# Patient Record
Sex: Female | Born: 1980 | Race: White | Hispanic: No | Marital: Married | Smoking: Former smoker
Health system: Southern US, Community
[De-identification: ages and names within clinical notes are randomized; demographics above are authoritative.]

## PROBLEM LIST (undated history)

## (undated) DIAGNOSIS — O34219 Maternal care for unspecified type scar from previous cesarean delivery: Secondary | ICD-10-CM

## (undated) DIAGNOSIS — F419 Anxiety disorder, unspecified: Secondary | ICD-10-CM

## (undated) DIAGNOSIS — N390 Urinary tract infection, site not specified: Secondary | ICD-10-CM

## (undated) DIAGNOSIS — J189 Pneumonia, unspecified organism: Secondary | ICD-10-CM

## (undated) DIAGNOSIS — G43909 Migraine, unspecified, not intractable, without status migrainosus: Secondary | ICD-10-CM

## (undated) DIAGNOSIS — D649 Anemia, unspecified: Secondary | ICD-10-CM

## (undated) DIAGNOSIS — N200 Calculus of kidney: Secondary | ICD-10-CM

## (undated) DIAGNOSIS — I639 Cerebral infarction, unspecified: Secondary | ICD-10-CM

## (undated) DIAGNOSIS — R87629 Unspecified abnormal cytological findings in specimens from vagina: Secondary | ICD-10-CM

## (undated) DIAGNOSIS — N189 Chronic kidney disease, unspecified: Secondary | ICD-10-CM

## (undated) DIAGNOSIS — E785 Hyperlipidemia, unspecified: Secondary | ICD-10-CM

## (undated) DIAGNOSIS — Z87442 Personal history of urinary calculi: Secondary | ICD-10-CM

## (undated) HISTORY — PX: KIDNEY STONE SURGERY: SHX686

## (undated) HISTORY — DX: Unspecified abnormal cytological findings in specimens from vagina: R87.629

## (undated) HISTORY — DX: Cerebral infarction, unspecified: I63.9

## (undated) HISTORY — DX: Anxiety disorder, unspecified: F41.9

## (undated) HISTORY — DX: Calculus of kidney: N20.0

## (undated) HISTORY — DX: Migraine, unspecified, not intractable, without status migrainosus: G43.909

## (undated) HISTORY — DX: Chronic kidney disease, unspecified: N18.9

## (undated) HISTORY — DX: Urinary tract infection, site not specified: N39.0

## (undated) HISTORY — DX: Hyperlipidemia, unspecified: E78.5

---
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
## 2012-10-06 ENCOUNTER — Encounter: Payer: Self-pay | Admitting: Internal Medicine

## 2012-10-06 ENCOUNTER — Ambulatory Visit (INDEPENDENT_AMBULATORY_CARE_PROVIDER_SITE_OTHER): Payer: 59 | Admitting: Internal Medicine

## 2012-10-06 ENCOUNTER — Telehealth: Payer: Self-pay | Admitting: *Deleted

## 2012-10-06 VITALS — BP 120/78 | HR 80 | Temp 98.2°F | Resp 16 | Ht 65.0 in | Wt 134.0 lb

## 2012-10-06 DIAGNOSIS — Z23 Encounter for immunization: Secondary | ICD-10-CM

## 2012-10-06 DIAGNOSIS — Z1331 Encounter for screening for depression: Secondary | ICD-10-CM

## 2012-10-06 DIAGNOSIS — Z0000 Encounter for general adult medical examination without abnormal findings: Secondary | ICD-10-CM

## 2012-10-06 DIAGNOSIS — Z720 Tobacco use: Secondary | ICD-10-CM | POA: Insufficient documentation

## 2012-10-06 DIAGNOSIS — G43909 Migraine, unspecified, not intractable, without status migrainosus: Secondary | ICD-10-CM | POA: Insufficient documentation

## 2012-10-06 DIAGNOSIS — F17200 Nicotine dependence, unspecified, uncomplicated: Secondary | ICD-10-CM

## 2012-10-06 DIAGNOSIS — Z7189 Other specified counseling: Secondary | ICD-10-CM | POA: Insufficient documentation

## 2012-10-06 LAB — CBC WITH DIFFERENTIAL/PLATELET
Eosinophils Relative: 3 % (ref 0.0–5.0)
HCT: 44.8 % (ref 36.0–46.0)
Hemoglobin: 15.3 g/dL — ABNORMAL HIGH (ref 12.0–15.0)
Lymphs Abs: 1.1 10*3/uL (ref 0.7–4.0)
MCV: 95.4 fl (ref 78.0–100.0)
Monocytes Absolute: 0.5 10*3/uL (ref 0.1–1.0)
Monocytes Relative: 10 % (ref 3.0–12.0)
Neutro Abs: 3.1 10*3/uL (ref 1.4–7.7)
Platelets: 229 10*3/uL (ref 150.0–400.0)
RDW: 13 % (ref 11.5–14.6)
WBC: 4.9 10*3/uL (ref 4.5–10.5)

## 2012-10-06 LAB — LIPID PANEL
Cholesterol: 157 mg/dL (ref 0–200)
HDL: 69.5 mg/dL (ref >39.00)
LDL Cholesterol: 75 mg/dL (ref 0–99)
Triglycerides: 64 mg/dL (ref 0.0–149.0)
VLDL: 12.8 mg/dL (ref 0.0–40.0)

## 2012-10-06 LAB — POCT URINALYSIS DIPSTICK
Bilirubin, UA: NEGATIVE
Glucose, UA: NEGATIVE
Ketones, UA: NEGATIVE
Nitrite, UA: NEGATIVE
Spec Grav, UA: 1.015
pH, UA: 6.5

## 2012-10-06 LAB — COMPREHENSIVE METABOLIC PANEL
Alkaline Phosphatase: 73 U/L (ref 39–117)
BUN: 26 mg/dL — ABNORMAL HIGH (ref 6–23)
CO2: 28 mEq/L (ref 19–32)
Creatinine, Ser: 1.1 mg/dL (ref 0.4–1.2)
GFR: 62.49 mL/min (ref >60.00)
Glucose, Bld: 89 mg/dL (ref 70–99)
Sodium: 140 mEq/L (ref 135–145)
Total Bilirubin: 0.7 mg/dL (ref 0.3–1.2)
Total Protein: 7 g/dL (ref 6.0–8.3)

## 2012-10-06 MED ORDER — PRENATAL MULTIVIT-MIN-FE-FA 0.1 MG PO CAPS: ORAL_CAPSULE | ORAL | Status: DC

## 2012-10-06 NOTE — Assessment & Plan Note (Signed)
Strongly encourage smoking cessation. We discussed options including Chantix, Wellbutrin, nicotine replacement. Given that she is actively trying to get pregnant, would prefer not to start a new daily medication. Will plan to use nicotine replacement. Followup as needed.

## 2012-10-06 NOTE — Assessment & Plan Note (Signed)
Migraine headaches currently well-controlled with use of ibuprofen as needed. We'll continue to monitor. We discussed other medications including Imitrex. Followup as needed.

## 2012-10-06 NOTE — Telephone Encounter (Signed)
Would you like a urine culture done? Thank you 

## 2012-10-06 NOTE — Assessment & Plan Note (Signed)
Patient is actively trying to conceive. Will start prenatal vitamin. Will set up OB/GYN evaluation for her to establish care. Strongly encouraged smoking cessation.

## 2012-10-06 NOTE — Progress Notes (Signed)
Subjective:    Patient ID: Dana Henry, female    DOB: 09/11/1981, 31 y.o.   MRN: [id number]  HPI 30 year old female with history of migraine headaches presents to establish care. She reports that she is generally feeling well. She occasionally has light green type headaches, mostly prior to her menstrual cycle. They're described as diffuse aching headaches associated with photo and phonophobia. She denies nausea or vomiting. She typically takes 800 mg of ibuprofen with relief in symptoms. She has never been on preventative medications or used medication such as Imitrex.  She reports that she is actively trying to get pregnant. She is not currently taking a prenatal vitamin. She reports that her periods have been regular.  She is a current every day smoker. She typically smokes less than half-pack per day. She is trying to quit. She has never taken medication such as Chantix or Wellbutrin.   Outpatient Encounter Prescriptions as of 10/06/2012  Medication Sig Dispense Refill  . Prenatal Multivit-Min-Fe-FA 0.1 MG CAPS 1 Tablet by mouth daily  30 capsule  6  . [DISCONTINUED] Prenatal Multivit-Min-Fe-FA 0.1 MG CAPS 1 Tablet by mouth daily  30 capsule  6   BP 120/78  Pulse 80  Temp 98.2 F (36.8 C) (Oral)  Resp 16  Ht 5\' 5"  (1.651 m)  Wt 134 lb (60.782 kg)  BMI 22.30 kg/m2  SpO2 97%  LMP 09/30/2012  Review of Systems  Constitutional: Negative for fever, chills, appetite change, fatigue and unexpected weight change.  HENT: Negative for ear pain, congestion, sore throat, trouble swallowing, neck pain, voice change and sinus pressure.   Eyes: Negative for visual disturbance.  Respiratory: Negative for cough, shortness of breath, wheezing and stridor.   Cardiovascular: Negative for chest pain, palpitations and leg swelling.  Gastrointestinal: Negative for nausea, vomiting, abdominal pain, diarrhea, constipation, blood in stool, abdominal distention and anal bleeding.  Genitourinary:  Negative for dysuria and flank pain.  Musculoskeletal: Negative for myalgias, arthralgias and gait problem.  Skin: Negative for color change and rash.  Neurological: Negative for dizziness and headaches.  Hematological: Negative for adenopathy. Does not bruise/bleed easily.  Psychiatric/Behavioral: Negative for suicidal ideas, sleep disturbance and dysphoric mood. The patient is not nervous/anxious.        Objective:   Physical Exam  Constitutional: She is oriented to person, place, and time. She appears well-developed and well-nourished. No distress.  HENT:  Head: Normocephalic and atraumatic.  Right Ear: External ear normal.  Left Ear: External ear normal.  Nose: Nose normal.  Mouth/Throat: Oropharynx is clear and moist. No oropharyngeal exudate.  Eyes: Conjunctivae normal are normal. Pupils are equal, round, and reactive to light. Right eye exhibits no discharge. Left eye exhibits no discharge. No scleral icterus.  Neck: Normal range of motion. Neck supple. No tracheal deviation present. No thyromegaly present.  Cardiovascular: Normal rate, regular rhythm, normal heart sounds and intact distal pulses.  Exam reveals no gallop and no friction rub.   No murmur heard. Pulmonary/Chest: Effort normal and breath sounds normal. No respiratory distress. She has no wheezes. She has no rales. She exhibits no tenderness.  Abdominal: Soft. Bowel sounds are normal. She exhibits no distension. There is no tenderness.  Musculoskeletal: Normal range of motion. She exhibits no edema and no tenderness.  Lymphadenopathy:    She has no cervical adenopathy.  Neurological: She is alert and oriented to person, place, and time. No cranial nerve deficit. She exhibits normal muscle tone. Coordination normal.  Skin: Skin is warm and dry.  No rash noted. She is not diaphoretic. No erythema. No pallor.  Psychiatric: She has a normal mood and affect. Her behavior is normal. Judgment and thought content normal.           Assessment & Plan:

## 2012-10-06 NOTE — Telephone Encounter (Signed)
Yes, thank you.

## 2013-01-04 ENCOUNTER — Telehealth: Payer: Self-pay | Admitting: Internal Medicine

## 2013-01-04 NOTE — Telephone Encounter (Signed)
Needs to be seen first

## 2013-01-04 NOTE — Telephone Encounter (Signed)
Husband [name] calling into today about his wife Dana Henry. [phone]    Calling concerning neck pain and stiffness. Attempted to call back. No answer. Unable to leaving message . Voicemail is full.

## 2013-01-04 NOTE — Telephone Encounter (Signed)
Informed patient husband and he agrees.

## 2013-01-04 NOTE — Telephone Encounter (Signed)
Called and spoke with husband, she is having some really bad neck stiffness when she wake up in the morning. Thinks it may be the way she sleep and this happens maybe once a month. She can not turn it and she does have an appointment with Raquel tomorrow but he would like to know is there anything that maybe could be done prior to her coming in tomorrow. She is at work and has to hold her head a certain way to keep it from hurting.

## 2013-01-05 ENCOUNTER — Ambulatory Visit (INDEPENDENT_AMBULATORY_CARE_PROVIDER_SITE_OTHER): Payer: 59 | Admitting: Adult Health

## 2013-01-05 ENCOUNTER — Encounter: Payer: Self-pay | Admitting: Adult Health

## 2013-01-05 VITALS — BP 115/68 | HR 73 | Temp 97.6°F | Resp 14 | Ht 65.0 in | Wt 131.0 lb

## 2013-01-05 DIAGNOSIS — M436 Torticollis: Secondary | ICD-10-CM

## 2013-01-05 MED ORDER — CYCLOBENZAPRINE HCL 5 MG PO TABS: 5.0000 mg | ORAL_TABLET | Freq: Three times a day (TID) | ORAL | Status: DC | PRN

## 2013-01-05 NOTE — Assessment & Plan Note (Signed)
This appears muscular in origin. Patient woke up with neck discomfort. Start NSAID x 4 days (Aleve, ibuprofen, etc). Also start flexeril 5 mg tid for muscle spasms prn. Instructed patient to take 2 doses of flexeril today since she is out of work. Advised to then take only at bedtime since this causes sleepiness. Apply ice alternating with heat for ~ 20 min. Do this 3-4 times daily as needed for relief. 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 Advised her to check with her employer to make certain her station is ergonomically set up for her. She should also be getting up and walking around every 2 hours and I have also advised to do stretching exercises. May want to invest in a pillow that supports her neck in proper alignment. RTC if no improvement within 4-5 days.

## 2013-01-05 NOTE — Patient Instructions (Addendum)
  I have prescribed flexeril for your muscle spasms. This can make you sleepy. Try to get 2 doses in today since you will be out of work. Then only take it at bedtime.  Take Aleve for the next 4 days. This is an anti-inflammatory medication.  Apply ice alternating with heat for approximately 20 mins at a time. Do this 3-4 times daily.  You can also get the heating applications that are sold at [employer]  A pillow that has neck support will also be of benefit.

## 2013-01-05 NOTE — Progress Notes (Signed)
  Subjective:    Patient ID: Dana Henry, female    DOB: [birth year], 32 y.o.   MRN: [id number]  HPI  Patient is a pleasant 32 y/o female who presents to clinic this morning with "stiff neck and pain". She reports that she woke up with the pain yesterday. It is very difficult to turn her head to the sides. She thinks she slept in a "wrong position" that triggered this. She denies fever, chills, headache. She has only taken ibuprofen once.   Current Outpatient Prescriptions on File Prior to Visit  Medication Sig Dispense Refill  . Prenatal Multivit-Min-Fe-FA 0.1 MG CAPS 1 Tablet by mouth daily  30 capsule  6   No current facility-administered medications on file prior to visit.      Review of Systems  Constitutional: Negative for fever and chills.  HENT: Positive for neck pain and neck stiffness.   Respiratory: Negative.   Cardiovascular: Negative.   Neurological: Negative for dizziness, light-headedness and headaches.   BP 115/68  Pulse 73  Temp(Src) 97.6 F (36.4 C) (Oral)  Resp 14  Ht 5\' 5"  (1.651 m)  Wt 131 lb (59.421 kg)  BMI 21.8 kg/m2  SpO2 95%  LMP 12/30/2012     Objective:   Physical Exam  Constitutional: She is oriented to person, place, and time. She appears well-developed and well-nourished. No distress.  Appears uncomfortable and unable to turn head.  HENT:  Head: Normocephalic and atraumatic.  Neck:  Unable to move neck secondary to pain. No swelling noted. No pain with palpation. Muscle on the left side of neck and upper portion of trapezius is very tense.  Cardiovascular: Normal rate.   Pulmonary/Chest: Effort normal.  Lymphadenopathy:    She has no cervical adenopathy.  Neurological: She is alert and oriented to person, place, and time.  Skin: Skin is warm and dry.  Psychiatric: She has a normal mood and affect. Her behavior is normal. Judgment and thought content normal.          Assessment & Plan:

## 2013-08-24 ENCOUNTER — Other Ambulatory Visit: Payer: Self-pay

## 2013-12-29 LAB — OB RESULTS CONSOLE ANTIBODY SCREEN: ANTIBODY SCREEN: NEGATIVE

## 2013-12-29 LAB — OB RESULTS CONSOLE RPR: RPR: NONREACTIVE

## 2013-12-29 LAB — OB RESULTS CONSOLE HEPATITIS B SURFACE ANTIGEN: Hepatitis B Surface Ag: NEGATIVE

## 2013-12-29 LAB — OB RESULTS CONSOLE ABO/RH: RH Type: POSITIVE

## 2013-12-29 LAB — OB RESULTS CONSOLE RUBELLA ANTIBODY, IGM: RUBELLA: IMMUNE

## 2013-12-29 LAB — OB RESULTS CONSOLE HIV ANTIBODY (ROUTINE TESTING): HIV: NONREACTIVE

## 2014-01-01 LAB — OB RESULTS CONSOLE GC/CHLAMYDIA
Chlamydia: NEGATIVE
GC PROBE AMP, GENITAL: NEGATIVE

## 2014-06-21 LAB — OB RESULTS CONSOLE GBS: GBS: NEGATIVE

## 2014-07-16 ENCOUNTER — Telehealth (HOSPITAL_COMMUNITY): Payer: Self-pay | Admitting: *Deleted

## 2014-07-16 ENCOUNTER — Encounter (HOSPITAL_COMMUNITY): Payer: Self-pay | Admitting: *Deleted

## 2014-07-16 NOTE — Telephone Encounter (Signed)
Preadmission screen  

## 2014-07-17 ENCOUNTER — Other Ambulatory Visit: Payer: Self-pay | Admitting: Obstetrics & Gynecology

## 2014-07-20 ENCOUNTER — Inpatient Hospital Stay (HOSPITAL_COMMUNITY)
Admission: AD | Admit: 2014-07-20 | Discharge: 2014-07-24 | DRG: 765 | Disposition: A | Discharge status: Home / Self Care | Payer: 59 | Source: Ambulatory Visit | Attending: Obstetrics & Gynecology | Admitting: Obstetrics & Gynecology

## 2014-07-20 ENCOUNTER — Encounter (HOSPITAL_COMMUNITY): Payer: 59 | Admitting: Anesthesiology

## 2014-07-20 ENCOUNTER — Encounter (HOSPITAL_COMMUNITY): Payer: Self-pay | Admitting: *Deleted

## 2014-07-20 ENCOUNTER — Inpatient Hospital Stay (HOSPITAL_COMMUNITY): Payer: 59 | Admitting: Anesthesiology

## 2014-07-20 ENCOUNTER — Inpatient Hospital Stay (HOSPITAL_COMMUNITY): Admission: RE | Admit: 2014-07-20 | Payer: 59 | Source: Ambulatory Visit

## 2014-07-20 DIAGNOSIS — Z8249 Family history of ischemic heart disease and other diseases of the circulatory system: Secondary | ICD-10-CM | POA: Diagnosis not present

## 2014-07-20 DIAGNOSIS — O3663X0 Maternal care for excessive fetal growth, third trimester, not applicable or unspecified: Secondary | ICD-10-CM | POA: Diagnosis present

## 2014-07-20 DIAGNOSIS — Z3A41 41 weeks gestation of pregnancy: Secondary | ICD-10-CM | POA: Diagnosis present

## 2014-07-20 DIAGNOSIS — Z87891 Personal history of nicotine dependence: Secondary | ICD-10-CM

## 2014-07-20 DIAGNOSIS — D62 Acute posthemorrhagic anemia: Secondary | ICD-10-CM | POA: Diagnosis present

## 2014-07-20 DIAGNOSIS — O9912 Other diseases of the blood and blood-forming organs and certain disorders involving the immune mechanism complicating childbirth: Secondary | ICD-10-CM | POA: Diagnosis present

## 2014-07-20 DIAGNOSIS — D696 Thrombocytopenia, unspecified: Secondary | ICD-10-CM | POA: Diagnosis present

## 2014-07-20 DIAGNOSIS — O9902 Anemia complicating childbirth: Secondary | ICD-10-CM | POA: Diagnosis present

## 2014-07-20 DIAGNOSIS — Z3403 Encounter for supervision of normal first pregnancy, third trimester: Secondary | ICD-10-CM | POA: Diagnosis present

## 2014-07-20 DIAGNOSIS — O411230 Chorioamnionitis, third trimester, not applicable or unspecified: Secondary | ICD-10-CM | POA: Diagnosis present

## 2014-07-20 DIAGNOSIS — O480 Post-term pregnancy: Secondary | ICD-10-CM | POA: Diagnosis present

## 2014-07-20 DIAGNOSIS — Z87442 Personal history of urinary calculi: Secondary | ICD-10-CM

## 2014-07-20 DIAGNOSIS — Z3490 Encounter for supervision of normal pregnancy, unspecified, unspecified trimester: Secondary | ICD-10-CM

## 2014-07-20 DIAGNOSIS — O411290 Chorioamnionitis, unspecified trimester, not applicable or unspecified: Secondary | ICD-10-CM | POA: Diagnosis not present

## 2014-07-20 LAB — CBC
HCT: 38.3 % (ref 36.0–46.0)
Hemoglobin: 13.4 g/dL (ref 12.0–15.0)
MCH: 32.6 pg (ref 26.0–34.0)
MCHC: 35 g/dL (ref 30.0–36.0)
MCV: 93.2 fL (ref 78.0–100.0)
PLATELETS: 150 10*3/uL (ref 150–400)
RBC: 4.11 MIL/uL (ref 3.87–5.11)
RDW: 13.6 % (ref 11.5–15.5)
WBC: 10.8 10*3/uL — ABNORMAL HIGH (ref 4.0–10.5)

## 2014-07-20 LAB — RPR

## 2014-07-20 MED ORDER — EPHEDRINE 5 MG/ML INJ: 10.0000 mg | INTRAVENOUS | Status: DC | PRN

## 2014-07-20 MED ORDER — LIDOCAINE HCL (PF) 1 % IJ SOLN: 30.0000 mL | INTRAMUSCULAR | Status: DC | PRN

## 2014-07-20 MED ORDER — PHENYLEPHRINE 40 MCG/ML (10ML) SYRINGE FOR IV PUSH (FOR BLOOD PRESSURE SUPPORT)
80.0000 ug | PREFILLED_SYRINGE | INTRAVENOUS | Status: DC | PRN
  Filled 2014-07-20: qty 10

## 2014-07-20 MED ORDER — PHENYLEPHRINE 40 MCG/ML (10ML) SYRINGE FOR IV PUSH (FOR BLOOD PRESSURE SUPPORT): 80.0000 ug | PREFILLED_SYRINGE | INTRAVENOUS | Status: DC | PRN

## 2014-07-20 MED ORDER — LIDOCAINE HCL (PF) 1 % IJ SOLN
INTRAMUSCULAR | Status: DC | PRN
  Administered 2014-07-20 (×4): 4 mL

## 2014-07-20 MED ORDER — LACTATED RINGERS IV SOLN
500.0000 mL | Freq: Once | INTRAVENOUS | Status: AC
  Administered 2014-07-20: 500 mL via INTRAVENOUS

## 2014-07-20 MED ORDER — OXYTOCIN 40 UNITS IN LACTATED RINGERS INFUSION - SIMPLE MED
1.0000 m[IU]/min | INTRAVENOUS | Status: DC
  Administered 2014-07-20: 1 m[IU]/min via INTRAVENOUS
  Administered 2014-07-20: 3 m[IU]/min via INTRAVENOUS
  Filled 2014-07-20: qty 1000

## 2014-07-20 MED ORDER — ONDANSETRON HCL 4 MG/2ML IJ SOLN
4.0000 mg | Freq: Four times a day (QID) | INTRAMUSCULAR | Status: DC | PRN
  Administered 2014-07-20 – 2014-07-21 (×2): 4 mg via INTRAVENOUS
  Filled 2014-07-20 (×2): qty 2

## 2014-07-20 MED ORDER — ACETAMINOPHEN 325 MG PO TABS
650.0000 mg | ORAL_TABLET | ORAL | Status: DC | PRN
Start: 2014-07-20 — End: 2014-07-21

## 2014-07-20 MED ORDER — FENTANYL 2.5 MCG/ML BUPIVACAINE 1/10 % EPIDURAL INFUSION (WH - ANES)
14.0000 mL/h | INTRAMUSCULAR | Status: DC | PRN
  Administered 2014-07-20 – 2014-07-21 (×3): 14 mL/h via EPIDURAL
  Filled 2014-07-20 (×3): qty 125

## 2014-07-20 MED ORDER — OXYCODONE-ACETAMINOPHEN 5-325 MG PO TABS: 1.0000 | ORAL_TABLET | ORAL | Status: DC | PRN

## 2014-07-20 MED ORDER — OXYCODONE-ACETAMINOPHEN 5-325 MG PO TABS: 2.0000 | ORAL_TABLET | ORAL | Status: DC | PRN

## 2014-07-20 MED ORDER — DIPHENHYDRAMINE HCL 50 MG/ML IJ SOLN: 12.5000 mg | INTRAMUSCULAR | Status: DC | PRN

## 2014-07-20 MED ORDER — CITRIC ACID-SODIUM CITRATE 334-500 MG/5ML PO SOLN
30.0000 mL | ORAL | Status: DC | PRN
  Administered 2014-07-21: 30 mL via ORAL
  Filled 2014-07-20: qty 15

## 2014-07-20 MED ORDER — FLEET ENEMA 7-19 GM/118ML RE ENEM: 1.0000 | ENEMA | RECTAL | Status: DC | PRN

## 2014-07-20 MED ORDER — OXYTOCIN 40 UNITS IN LACTATED RINGERS INFUSION - SIMPLE MED
62.5000 mL/h | INTRAVENOUS | Status: DC
  Filled 2014-07-20: qty 1000

## 2014-07-20 MED ORDER — LACTATED RINGERS IV SOLN
INTRAVENOUS | Status: DC
  Administered 2014-07-20 – 2014-07-21 (×6): via INTRAVENOUS

## 2014-07-20 MED ORDER — OXYTOCIN BOLUS FROM INFUSION: 500.0000 mL | INTRAVENOUS | Status: DC

## 2014-07-20 MED ORDER — LACTATED RINGERS IV SOLN
500.0000 mL | INTRAVENOUS | Status: DC | PRN
Start: 2014-07-20 — End: 2014-07-21
  Administered 2014-07-21: 500 mL via INTRAVENOUS

## 2014-07-20 MED ORDER — TERBUTALINE SULFATE 1 MG/ML IJ SOLN: 0.2500 mg | Freq: Once | INTRAMUSCULAR | Status: AC | PRN

## 2014-07-20 NOTE — H&P (Signed)
Dana Henry is a 33 y.o. female presenting at 41.1 wks in early labor. Pt was originally scheduled for IOL tonight for dates but she presented to office this morning with UCs since 5 am and was noted to be 3 cm dilated. Patient ambulated a bit and same for direct admission.  Denies vag bleeding/ leaking fluid and feels good FMs.  Dana Henry. Uncomplicated pregnancy, suspect LGA, but no GDM.   History OB History   Grav Para Term Preterm Abortions TAB SAB Ect Mult Living   1              Past Medical History  Diagnosis Date  . Migraines     associated with menstrual cycle  . UTI (urinary tract infection)   . Kidney stones     surgically removed at 33YO and 33YO  . Vaginal Pap smear, abnormal    Past Surgical History  Procedure Laterality Date  . Kidney stone surgery    . Tonsillectomy and adenoidectomy  1991   Family History: family history includes Alcohol abuse in her father; Hypertension in her mother. Social History:  reports that she quit smoking about 9 months ago. Her smoking use included Cigarettes. She smoked 0.50 packs per day. She has never used smokeless tobacco. She reports that she does not drink alcohol or use illicit drugs.   Prenatal Transfer Tool  Maternal Diabetes: No Genetic Screening: Normal Maternal Ultrasounds/Referrals: Normal Fetal Ultrasounds or other Referrals:  None Maternal Substance Abuse:  No Significant Maternal Medications:  None Significant Maternal Lab Results:  Lab values include: Group B Strep negative Other Comments:  None  ROSneg  Dilation: 3.5 Effacement (%): 50 Station: -2 Exam by:: Dana Henry Blood pressure 113/68, pulse 79, temperature 98.2 F (36.8 C), temperature source Oral, resp. rate 18, height 5\' 4"  (1.626 m), weight 200 lb (90.719 kg), last menstrual period 10/05/2013. Exam Physical Exam  Physical exam:  A&O x 3, no acute distress. Pleasant HEENT neg, no thyromegaly Lungs CTA bilat CV RRR, A1S2 normal Abdo  soft, non tender, non acute Extr no edema/ tenderness Pelvic above FHT  140s/ category I Toco irregular q 4 min  Prenatal labs: ABO, Rh: A/Positive/-- (03/13 0000) Antibody: Negative (03/13 0000) Rubella: Immune (03/13 0000) RPR: Nonreactive (03/13 0000)  HBsAg: Negative (03/13 0000)  HIV: Non-reactive (03/13 0000)  GBS: Negative (09/03 0000)   Assessment/Plan: 33 yo, at 41.1 wks, early labor, admit for labor AOL. GBS(-). FHT category I  Dana Henry R 07/20/2014, 3:39 PM

## 2014-07-20 NOTE — Anesthesia Preprocedure Evaluation (Addendum)
Anesthesia Evaluation  Patient identified by MRN, date of birth, ID band Patient awake    Reviewed: Allergy & Precautions, H&P , NPO status , Patient's Chart, lab work & pertinent test results, reviewed documented beta blocker date and time   History of Anesthesia Complications Negative for: history of anesthetic complications  Airway Mallampati: III TM Distance: >3 FB Neck ROM: full    Dental  (+) Teeth Intact   Pulmonary neg pulmonary ROS, former smoker,  breath sounds clear to auscultation        Cardiovascular negative cardio ROS  Rhythm:regular Rate:Normal     Neuro/Psych  Headaches (migraines), negative psych ROS   GI/Hepatic negative GI ROS, Neg liver ROS,   Endo/Other  BMI 34.4  Renal/GU Renal disease (kidney stones)  negative genitourinary   Musculoskeletal   Abdominal   Peds  Hematology negative hematology ROS (+)   Anesthesia Other Findings   Reproductive/Obstetrics (+) Pregnancy                          Anesthesia Physical Anesthesia Plan  ASA: II  Anesthesia Plan: Epidural   Post-op Pain Management:    Induction:   Airway Management Planned:   Additional Equipment:   Intra-op Plan:   Post-operative Plan:   Informed Consent: I have reviewed the patients History and Physical, chart, labs and discussed the procedure including the risks, benefits and alternatives for the proposed anesthesia with the patient or authorized representative who has indicated his/her understanding and acceptance.     Plan Discussed with:   Anesthesia Plan Comments:         Anesthesia Quick Evaluation

## 2014-07-20 NOTE — Anesthesia Procedure Notes (Signed)
Epidural Patient location during procedure: OB Start time: 07/20/2014 6:43 PM  Staffing Anesthesiologist: Natilee Gauer Performed by: anesthesiologist   Preanesthetic Checklist Completed: patient identified, site marked, surgical consent, pre-op evaluation, timeout performed, IV checked, risks and benefits discussed and monitors and equipment checked  Epidural Patient position: sitting Prep: site prepped and draped and DuraPrep Patient monitoring: continuous pulse ox and blood pressure Approach: midline Location: L3-L4 Injection technique: LOR air  Needle:  Needle type: Tuohy  Needle gauge: 17 G Needle length: 9 cm and 9 Needle insertion depth: 5 cm cm Catheter type: closed end flexible Catheter size: 19 Gauge Catheter at skin depth: 10 cm Test dose: negative  Assessment Events: blood not aspirated, injection not painful, no injection resistance, negative IV test and no paresthesia  Additional Notes Discussed risk of headache, infection, bleeding, nerve injury and failed or incomplete block.  Patient voices understanding and wishes to proceed.  Epidural placed on first attempt.  No paresthesia.  Patient tolerated procedure well with no apparent complications.  Charlton Haws, MDReason for block:procedure for pain

## 2014-07-21 ENCOUNTER — Encounter (HOSPITAL_COMMUNITY)
Admission: AD | Disposition: A | Discharge status: Home / Self Care | Payer: Self-pay | Source: Ambulatory Visit | Attending: Obstetrics & Gynecology

## 2014-07-21 ENCOUNTER — Encounter (HOSPITAL_COMMUNITY): Payer: Self-pay | Admitting: *Deleted

## 2014-07-21 DIAGNOSIS — O411290 Chorioamnionitis, unspecified trimester, not applicable or unspecified: Secondary | ICD-10-CM | POA: Diagnosis not present

## 2014-07-21 LAB — CBC
HCT: 33.5 % — ABNORMAL LOW (ref 36.0–46.0)
Hemoglobin: 11.7 g/dL — ABNORMAL LOW (ref 12.0–15.0)
MCH: 32.4 pg (ref 26.0–34.0)
MCHC: 34.9 g/dL (ref 30.0–36.0)
MCV: 92.8 fL (ref 78.0–100.0)
PLATELETS: 115 10*3/uL — AB (ref 150–400)
RBC: 3.61 MIL/uL — ABNORMAL LOW (ref 3.87–5.11)
RDW: 13.6 % (ref 11.5–15.5)
WBC: 17.7 10*3/uL — ABNORMAL HIGH (ref 4.0–10.5)

## 2014-07-21 LAB — TYPE AND SCREEN
ABO/RH(D): A POS
ANTIBODY SCREEN: NEGATIVE

## 2014-07-21 LAB — ABO/RH: ABO/RH(D): A POS

## 2014-07-21 SURGERY — Surgical Case
Anesthesia: Epidural

## 2014-07-21 MED ORDER — NALBUPHINE HCL 10 MG/ML IJ SOLN: 5.0000 mg | INTRAMUSCULAR | Status: DC | PRN

## 2014-07-21 MED ORDER — MORPHINE SULFATE (PF) 0.5 MG/ML IJ SOLN
INTRAMUSCULAR | Status: DC | PRN
  Administered 2014-07-21: 4 mg via EPIDURAL

## 2014-07-21 MED ORDER — OXYCODONE HCL 5 MG/5ML PO SOLN: 5.0000 mg | Freq: Once | ORAL | Status: DC | PRN

## 2014-07-21 MED ORDER — ZOLPIDEM TARTRATE 5 MG PO TABS: 5.0000 mg | ORAL_TABLET | Freq: Every evening | ORAL | Status: DC | PRN

## 2014-07-21 MED ORDER — SIMETHICONE 80 MG PO CHEW
80.0000 mg | CHEWABLE_TABLET | Freq: Three times a day (TID) | ORAL | Status: DC
  Administered 2014-07-21 – 2014-07-23 (×6): 80 mg via ORAL
  Filled 2014-07-21 (×5): qty 1

## 2014-07-21 MED ORDER — OXYTOCIN 10 UNIT/ML IJ SOLN
INTRAMUSCULAR | Status: AC
  Filled 2014-07-21: qty 4

## 2014-07-21 MED ORDER — MORPHINE SULFATE 0.5 MG/ML IJ SOLN
INTRAMUSCULAR | Status: AC
  Filled 2014-07-21: qty 10

## 2014-07-21 MED ORDER — OXYCODONE-ACETAMINOPHEN 5-325 MG PO TABS
1.0000 | ORAL_TABLET | ORAL | Status: DC | PRN
  Administered 2014-07-23 (×2): 1 via ORAL
  Filled 2014-07-21 (×2): qty 1

## 2014-07-21 MED ORDER — SODIUM BICARBONATE 8.4 % IV SOLN
INTRAVENOUS | Status: DC | PRN
  Administered 2014-07-21 (×4): 5 mL via EPIDURAL

## 2014-07-21 MED ORDER — LACTATED RINGERS IV SOLN
INTRAVENOUS | Status: DC
Start: 2014-07-21 — End: 2014-07-22
  Administered 2014-07-22: 01:00:00 via INTRAVENOUS

## 2014-07-21 MED ORDER — DIPHENHYDRAMINE HCL 50 MG/ML IJ SOLN: 12.5000 mg | INTRAMUSCULAR | Status: DC | PRN

## 2014-07-21 MED ORDER — WITCH HAZEL-GLYCERIN EX PADS: 1.0000 "application " | MEDICATED_PAD | CUTANEOUS | Status: DC | PRN

## 2014-07-21 MED ORDER — SODIUM BICARBONATE 8.4 % IV SOLN
INTRAVENOUS | Status: AC
  Filled 2014-07-21: qty 50

## 2014-07-21 MED ORDER — OXYTOCIN 40 UNITS IN LACTATED RINGERS INFUSION - SIMPLE MED: 62.5000 mL/h | INTRAVENOUS | Status: AC

## 2014-07-21 MED ORDER — KETOROLAC TROMETHAMINE 30 MG/ML IJ SOLN: 30.0000 mg | Freq: Four times a day (QID) | INTRAMUSCULAR | Status: DC | PRN

## 2014-07-21 MED ORDER — SIMETHICONE 80 MG PO CHEW: 80.0000 mg | CHEWABLE_TABLET | ORAL | Status: DC | PRN

## 2014-07-21 MED ORDER — MEPERIDINE HCL 25 MG/ML IJ SOLN: 6.2500 mg | INTRAMUSCULAR | Status: DC | PRN

## 2014-07-21 MED ORDER — SODIUM CHLORIDE 0.9 % IJ SOLN: 3.0000 mL | INTRAMUSCULAR | Status: DC | PRN

## 2014-07-21 MED ORDER — ONDANSETRON HCL 4 MG/2ML IJ SOLN: 4.0000 mg | INTRAMUSCULAR | Status: DC | PRN

## 2014-07-21 MED ORDER — PHENYLEPHRINE 40 MCG/ML (10ML) SYRINGE FOR IV PUSH (FOR BLOOD PRESSURE SUPPORT)
PREFILLED_SYRINGE | INTRAVENOUS | Status: AC
  Filled 2014-07-21: qty 10

## 2014-07-21 MED ORDER — ACETAMINOPHEN 500 MG PO TABS
1000.0000 mg | ORAL_TABLET | Freq: Once | ORAL | Status: AC
  Administered 2014-07-21: 1000 mg via ORAL
  Filled 2014-07-21: qty 2

## 2014-07-21 MED ORDER — PRENATAL MULTIVITAMIN CH
1.0000 | ORAL_TABLET | Freq: Every day | ORAL | Status: DC
  Administered 2014-07-22 – 2014-07-23 (×2): 1 via ORAL
  Filled 2014-07-21 (×2): qty 1

## 2014-07-21 MED ORDER — ONDANSETRON HCL 4 MG PO TABS: 4.0000 mg | ORAL_TABLET | ORAL | Status: DC | PRN

## 2014-07-21 MED ORDER — OXYTOCIN 10 UNIT/ML IJ SOLN
40.0000 [IU] | INTRAVENOUS | Status: DC | PRN
  Administered 2014-07-21: 40 [IU] via INTRAVENOUS

## 2014-07-21 MED ORDER — SIMETHICONE 80 MG PO CHEW
80.0000 mg | CHEWABLE_TABLET | ORAL | Status: DC
  Administered 2014-07-21 – 2014-07-23 (×3): 80 mg via ORAL
  Filled 2014-07-21 (×2): qty 1

## 2014-07-21 MED ORDER — NALOXONE HCL 1 MG/ML IJ SOLN: 1.0000 ug/kg/h | INTRAVENOUS | Status: DC | PRN

## 2014-07-21 MED ORDER — SCOPOLAMINE 1 MG/3DAYS TD PT72
MEDICATED_PATCH | TRANSDERMAL | Status: AC
  Administered 2014-07-21: 1.5 mg via TRANSDERMAL
  Filled 2014-07-21: qty 1

## 2014-07-21 MED ORDER — PROMETHAZINE HCL 25 MG/ML IJ SOLN: 6.2500 mg | INTRAMUSCULAR | Status: DC | PRN

## 2014-07-21 MED ORDER — HYDROMORPHONE HCL 1 MG/ML IJ SOLN: 0.2500 mg | INTRAMUSCULAR | Status: DC | PRN

## 2014-07-21 MED ORDER — TETANUS-DIPHTH-ACELL PERTUSSIS 5-2.5-18.5 LF-MCG/0.5 IM SUSP: 0.5000 mL | Freq: Once | INTRAMUSCULAR | Status: DC

## 2014-07-21 MED ORDER — CEFOXITIN SODIUM 2 G IV SOLR
2.0000 g | Freq: Four times a day (QID) | INTRAVENOUS | Status: DC
  Administered 2014-07-21 – 2014-07-22 (×6): 2 g via INTRAVENOUS
  Filled 2014-07-21 (×7): qty 2

## 2014-07-21 MED ORDER — ONDANSETRON HCL 4 MG/2ML IJ SOLN: 4.0000 mg | Freq: Three times a day (TID) | INTRAMUSCULAR | Status: DC | PRN

## 2014-07-21 MED ORDER — LIDOCAINE-EPINEPHRINE (PF) 2 %-1:200000 IJ SOLN
INTRAMUSCULAR | Status: AC
  Filled 2014-07-21: qty 20

## 2014-07-21 MED ORDER — DIPHENHYDRAMINE HCL 25 MG PO CAPS
25.0000 mg | ORAL_CAPSULE | ORAL | Status: DC | PRN
Start: 2014-07-21 — End: 2014-07-22

## 2014-07-21 MED ORDER — ONDANSETRON HCL 4 MG/2ML IJ SOLN
INTRAMUSCULAR | Status: AC
  Filled 2014-07-21: qty 4

## 2014-07-21 MED ORDER — IBUPROFEN 600 MG PO TABS
600.0000 mg | ORAL_TABLET | Freq: Four times a day (QID) | ORAL | Status: DC
  Administered 2014-07-21 – 2014-07-24 (×11): 600 mg via ORAL
  Filled 2014-07-21 (×10): qty 1

## 2014-07-21 MED ORDER — INFLUENZA VAC SPLIT QUAD 0.5 ML IM SUSY
0.5000 mL | PREFILLED_SYRINGE | INTRAMUSCULAR | Status: AC | PRN
  Administered 2014-07-21: 0.5 mL via INTRAMUSCULAR

## 2014-07-21 MED ORDER — DIBUCAINE 1 % RE OINT: 1.0000 "application " | TOPICAL_OINTMENT | RECTAL | Status: DC | PRN

## 2014-07-21 MED ORDER — NALOXONE HCL 0.4 MG/ML IJ SOLN: 0.4000 mg | INTRAMUSCULAR | Status: DC | PRN

## 2014-07-21 MED ORDER — LANOLIN HYDROUS EX OINT: 1.0000 "application " | TOPICAL_OINTMENT | CUTANEOUS | Status: DC | PRN

## 2014-07-21 MED ORDER — PHENYLEPHRINE 8 MG IN D5W 100 ML (0.08MG/ML) PREMIX OPTIME
INJECTION | INTRAVENOUS | Status: DC | PRN
  Administered 2014-07-21: 60 ug/min via INTRAVENOUS

## 2014-07-21 MED ORDER — MENTHOL 3 MG MT LOZG: 1.0000 | LOZENGE | OROMUCOSAL | Status: DC | PRN

## 2014-07-21 MED ORDER — SENNOSIDES-DOCUSATE SODIUM 8.6-50 MG PO TABS
2.0000 | ORAL_TABLET | ORAL | Status: DC
  Administered 2014-07-21 – 2014-07-23 (×3): 2 via ORAL
  Filled 2014-07-21 (×2): qty 2

## 2014-07-21 MED ORDER — NALBUPHINE HCL 10 MG/ML IJ SOLN: 5.0000 mg | Freq: Once | INTRAMUSCULAR | Status: AC | PRN

## 2014-07-21 MED ORDER — SCOPOLAMINE 1 MG/3DAYS TD PT72
1.0000 | MEDICATED_PATCH | Freq: Once | TRANSDERMAL | Status: DC
  Administered 2014-07-21: 1.5 mg via TRANSDERMAL

## 2014-07-21 MED ORDER — OXYCODONE HCL 5 MG PO TABS: 5.0000 mg | ORAL_TABLET | Freq: Once | ORAL | Status: DC | PRN

## 2014-07-21 MED ORDER — OXYCODONE-ACETAMINOPHEN 5-325 MG PO TABS: 2.0000 | ORAL_TABLET | ORAL | Status: DC | PRN

## 2014-07-21 MED ORDER — DIPHENHYDRAMINE HCL 25 MG PO CAPS: 25.0000 mg | ORAL_CAPSULE | Freq: Four times a day (QID) | ORAL | Status: DC | PRN

## 2014-07-21 SURGICAL SUPPLY — 40 items
BENZOIN TINCTURE PRP APPL 2/3 (GAUZE/BANDAGES/DRESSINGS) ×3 IMPLANT
CLAMP CORD UMBIL (MISCELLANEOUS) IMPLANT
CLOSURE WOUND 1/2 X4 (GAUZE/BANDAGES/DRESSINGS) ×1
CLOTH BEACON ORANGE TIMEOUT ST (SAFETY) ×3 IMPLANT
CONTAINER PREFILL 10% NBF 15ML (MISCELLANEOUS) IMPLANT
COVER LIGHT HANDLE  1/PK (MISCELLANEOUS) ×4
COVER LIGHT HANDLE 1/PK (MISCELLANEOUS) ×2 IMPLANT
DRAPE SHEET LG 3/4 BI-LAMINATE (DRAPES) IMPLANT
DRSG OPSITE POSTOP 4X10 (GAUZE/BANDAGES/DRESSINGS) ×3 IMPLANT
DURAPREP 26ML APPLICATOR (WOUND CARE) ×3 IMPLANT
ELECT REM PT RETURN 9FT ADLT (ELECTROSURGICAL) ×3
ELECTRODE REM PT RTRN 9FT ADLT (ELECTROSURGICAL) ×1 IMPLANT
EXTRACTOR VACUUM KIWI (MISCELLANEOUS) IMPLANT
EXTRACTOR VACUUM M CUP 4 TUBE (SUCTIONS) IMPLANT
EXTRACTOR VACUUM M CUP 4' TUBE (SUCTIONS)
GLOVE BIO SURGEON STRL SZ7 (GLOVE) ×3 IMPLANT
GLOVE BIOGEL PI IND STRL 7.0 (GLOVE) ×1 IMPLANT
GLOVE BIOGEL PI INDICATOR 7.0 (GLOVE) ×2
GOWN STRL REUS W/TWL LRG LVL3 (GOWN DISPOSABLE) ×6 IMPLANT
KIT ABG SYR 3ML LUER SLIP (SYRINGE) IMPLANT
NEEDLE HYPO 25X5/8 SAFETYGLIDE (NEEDLE) IMPLANT
NS IRRIG 1000ML POUR BTL (IV SOLUTION) ×3 IMPLANT
PACK C SECTION WH (CUSTOM PROCEDURE TRAY) ×3 IMPLANT
PAD OB MATERNITY 4.3X12.25 (PERSONAL CARE ITEMS) ×3 IMPLANT
RTRCTR C-SECT PINK 25CM LRG (MISCELLANEOUS) IMPLANT
STAPLER VISISTAT 35W (STAPLE) IMPLANT
STRIP CLOSURE SKIN 1/2X4 (GAUZE/BANDAGES/DRESSINGS) ×2 IMPLANT
SUT MON AB-0 CT1 36 (SUTURE) ×9 IMPLANT
SUT PLAIN 0 NONE (SUTURE) IMPLANT
SUT PLAIN 2 0 (SUTURE)
SUT PLAIN ABS 2-0 CT1 27XMFL (SUTURE) IMPLANT
SUT VIC AB 0 CT1 27 (SUTURE) ×4
SUT VIC AB 0 CT1 27XBRD ANBCTR (SUTURE) ×2 IMPLANT
SUT VIC AB 2-0 CT1 27 (SUTURE) ×4
SUT VIC AB 2-0 CT1 TAPERPNT 27 (SUTURE) ×2 IMPLANT
SUT VIC AB 4-0 KS 27 (SUTURE) IMPLANT
SUT VICRYL 0 TIES 12 18 (SUTURE) IMPLANT
TOWEL OR 17X24 6PK STRL BLUE (TOWEL DISPOSABLE) ×3 IMPLANT
TRAY FOLEY CATH 14FR (SET/KITS/TRAYS/PACK) IMPLANT
WATER STERILE IRR 1000ML POUR (IV SOLUTION) ×3 IMPLANT

## 2014-07-21 NOTE — Progress Notes (Signed)
Lenise Jr is a 33 y.o. G1P0 at [redacted]w[redacted]d labor AOL.  Subjective: No complaints, epidural working well. Fever since last night, suspect chorioamnionitis with maternal fever, tachycardia and fetal change in baseline from 140 to 155  Objective: BP 110/54  Pulse 107  Temp(Src) 102 F (38.9 C) (Axillary)  Resp 20  Ht 5\' 4"  (1.626 m)  Wt 200 lb (90.719 kg)  BMI 34.31 kg/m2  SpO2 100%  LMP 10/05/2013 I/O last 3 completed shifts: In: -  Out: 350 [Urine:350]   FHT:  FHR: 160s bpm, variability: moderate,  accelerations:  Present,  decelerations:  Absent UC:   regular, every 2 minutes, pitocin at 8 mu SVE:   Dilation: 8 Effacement (%): 90 Station: -1 Exam by:: Charmaine Placido Protracted labor- 3 cm at admission at 12 noon, 6 cm at 1 am and 8 cm since 6 am.    Assessment / Plan: Protracted active phase, chorioamnionitis, meconium stained fluid since admission.  IUPC placed. If labor adequate, plan to proceed with C-section for arrest of dilatation.  FHT category I except change in baseline.   Efe Fazzino R 07/21/2014, 10:02 AM

## 2014-07-21 NOTE — Consult Note (Signed)
Neonatology Note:   Attendance at C-section:    I was asked by Dr. Benjie Karvonen to attend this primary C/S at 28 1/7 weeks due to arrested labor, suspected chorioamnionitis, and known LGA infant. The mother is a G1P0 A pos, GBS neg with a previously uncomplicated pregnancy. The mother had temperature of 102 degrees during labor and received IV Cefoxitin > 4 hours prior to delivery. ROM 22 hours prior to delivery, fluid with thick meconium. Infant vigorous with good spontaneous cry and tone. Needed only bulb suctioning for some thick green secretions from the mouth and nose. Ap 9/9. Lungs clear to ausc in DR. To CN to care of Pediatrician.   Real Cons, MD

## 2014-07-21 NOTE — Progress Notes (Signed)
Dana Henry is a 33 y.o. G1P0 at [redacted]w[redacted]d labor AOL.  Subjective: No complaints, epidural working well. Fever since last night, suspect chorioamnionitis with maternal fever, tachycardia and fetal change in baseline from 140 to 155  Objective: BP 107/65  Pulse 120  Temp(Src) 102 F (38.9 C) (Axillary)  Resp 20  Ht 5\' 4"  (1.626 m)  Wt 200 lb (90.719 kg)  BMI 34.31 kg/m2  SpO2 100%  LMP 10/05/2013    FHT:  FHR: 160s bpm, variability: moderate,  accelerations:  Present,  decelerations:  Absent UC:   regular, every 2 minutes, pitocin at 8 mu SVE:   Dilation: 7 (cervix is starting to swell) Effacement (%): 90 Station: -1 Exam by:: Dana Henry Protracted labor- 3 cm at admission at 12 noon, 6 cm at 1 am and 8 cm since 6 am. Now thick swollen cervix with no further progress.    Assessment / Plan: Protracted active phase now with arrest of labor at 7-8 cm. Proceed with C-section.  Chorioamnionitis, meconium stained fluid. Suspect LGA Risks/complications of surgery reviewed incl infection, bleeding, damage to internal organs including bladder, bowels, ureters, blood vessels, other risks from anesthesia, VTE and delayed complications of any surgery, complications in future surgery reviewed. Also discussed neonatal complications incl difficult delivery, laceration, vacuum assistance, TTN etc. Pt understands and agrees, all concerns addressed.      Dana Henry R 07/21/2014, 11:35 AM

## 2014-07-21 NOTE — Anesthesia Postprocedure Evaluation (Signed)
Anesthesia Post Note  Patient: Dana Henry  Procedure(s) Performed: Procedure(s) (LRB): CESAREAN SECTION (N/A)  Anesthesia type: Epidural  Patient location: PACU  Post pain: Pain level controlled  Post assessment: Post-op Vital signs reviewed  Last Vitals: BP 109/55  Pulse 90  Temp(Src) 37 C (Oral)  Resp 23  Ht 5\' 4"  (1.626 m)  Wt 200 lb (90.719 kg)  BMI 34.31 kg/m2  SpO2 100%  LMP 10/05/2013  Breastfeeding? Unknown  Post vital signs: Reviewed  Level of consciousness: sedated  Complications: No apparent anesthesia complications

## 2014-07-21 NOTE — Op Note (Signed)
Cesarean Section Procedure Note   Dana Henry  07/20/2014 - 07/21/2014  Indications: Arrest of dilatation at 8 cm for 5 hours    Pre-operative Diagnosis: Arrest of active phase of labor. Chorioamnionitis. Meconium in amniotic fluid. Suspected LGA.    Post-operative Diagnosis: Same   Surgeon: Elveria Royals, MD   Assistants: Artelia Laroche, CNM  Anesthesia: Epidural    Procedure Details:  The patient was seen in the Labor room. The risks, benefits, complications, treatment options, and expected outcomes were discussed with the patient. The patient concurred with the proposed plan, giving informed consent. identified as Dana Henry and the procedure verified as C-Section Delivery. A Time Out was held and the above information confirmed.  After induction of epidural anesthesia, the patient was draped and prepped in the usual sterile manner. A Pfannenstiel Incision was made and carried down through the subcutaneous tissue to the fascia. Fascial incision was made and extended transversely. The fascia was separated from the underlying rectus tissue superiorly and inferiorly. The peritoneum was identified and entered. Peritoneal incision was extended longitudinally. Alexis-O retractor was placed.  The utero-vesical peritoneal reflection was incised transversely and the bladder flap was bluntly freed from the lower uterine segment. A low transverse uterine incision was made. Thick meconium noted. Delivered from cephalic presentation was a Female infant with vigorous cry at 12.37 pm on 07/21/2014. Nose mouth bulb suctioned and baby was handed to NICU team in attendance,  Apgar scores of 9 at one minute and 9 at five minutes. Cord ph was sent the umbilical cord was clamped and cut cord blood was obtained for evaluation. The placenta was removed Intact and appeared normal but with meconium staining.  The uterine outline, tubes and ovaries appeared normal. The uterine incision was closed with running  locked sutures of 0Monocryl in 2 layers.   Hemostasis was observed. Alexis retractor was removed. Peritoneum closed with 2-0Vicryl. The fascia was then reapproximated with running sutures of 0Vicryl. The subcuticular closure was performed using 2-0plain gut. The skin was closed with 4-0Vicryl.   Instrument, sponge, and needle counts were correct prior the abdominal closure and were correct at the conclusion of the case.   Findings: Thick meconium noted in amniotic fluid (was thin at the start of labor). Baby GIRL delivered cephalic from [facility] Hysterotomy at 12.37 pm on 07/21/14, Apgars 9 and 9. Normal tubes, ovaries. Placenta large, 3v cord, meconium stained.    Estimated Blood Loss: 700 cc  Total IV Fluids: 3500 cc LR  Urine Output: 200CC OF clear urine  Specimens: Cord gas (arterial) and cord blood and placenta  Complications: no complications  Disposition: PACU - hemodynamically stable.   Maternal Condition: stable   Baby condition / location:  Couplet care / Skin to Skin  Attending Attestation: I performed the procedure.   Signed: Surgeon(s): Elveria Royals, MD

## 2014-07-21 NOTE — Transfer of Care (Signed)
Immediate Anesthesia Transfer of Care Note  Patient: Dana Henry  Procedure(s) Performed: Procedure(s): CESAREAN SECTION (N/A)  Patient Location: PACU  Anesthesia Type:Epidural  Level of Consciousness: awake, alert  and oriented  Airway & Oxygen Therapy: Patient Spontanous Breathing  Post-op Assessment: Post -op Vital signs reviewed and stable  Post vital signs: Reviewed and stable  Complications: No apparent anesthesia complications

## 2014-07-21 NOTE — Lactation Note (Signed)
This note was copied from the chart of Dana [name]. Lactation Consultation Note Initial visit at 6 hours of age.  Mom reports a few good feedings and went to breastfeeding class and remembers basics.  Baby held by visitor showing feeding cues.  Assisted with football hold STS on right breast.  Several attempts to get a pain free latch.  Nipple compression a few times.  Assisted with positioning.  Baby did get a good latch  After I rolled out her lower lip.  Several strong sucking bursts noted.  Baby continue to feed.    [facility] LC resources given and discussed.  Encouraged to feed with early cues on demand.  Early newborn behavior discussed.  Hand expression demonstrated with small amount of colostrum visible.  Mom to call for assist as needed.    Patient Name: Dana Henry [id number] Date: 07/21/2014 Reason for consult: Initial assessment   Maternal Data Has patient been taught Hand Expression?: Yes Does the patient have breastfeeding experience prior to this delivery?: No  Feeding Feeding Type: Breast Fed Length of feed:  (10 minutes observed)  LATCH Score/Interventions Latch: Repeated attempts needed to sustain latch, nipple held in mouth throughout feeding, stimulation needed to elicit sucking reflex.  Audible Swallowing: A few with stimulation Intervention(s): Skin to skin;Hand expression;Alternate breast massage  Type of Nipple: Everted at rest and after stimulation  Comfort (Breast/Nipple): Soft / non-tender     Hold (Positioning): Assistance needed to correctly position infant at breast and maintain latch. Intervention(s): Skin to skin;Position options;Support Pillows;Breastfeeding basics reviewed  LATCH Score: 7  Lactation Tools Discussed/Used     Consult Status Consult Status: Follow-up Date: 07/22/14 Follow-up type: In-patient    Shoptaw, Justine Null 07/21/2014, 7:30 PM

## 2014-07-22 LAB — CBC
HCT: 29.7 % — ABNORMAL LOW (ref 36.0–46.0)
HEMOGLOBIN: 10.4 g/dL — AB (ref 12.0–15.0)
MCH: 32.4 pg (ref 26.0–34.0)
MCHC: 35 g/dL (ref 30.0–36.0)
MCV: 92.5 fL (ref 78.0–100.0)
Platelets: 116 10*3/uL — ABNORMAL LOW (ref 150–400)
RBC: 3.21 MIL/uL — AB (ref 3.87–5.11)
RDW: 13.8 % (ref 11.5–15.5)
WBC: 13.7 10*3/uL — ABNORMAL HIGH (ref 4.0–10.5)

## 2014-07-22 MED ORDER — DEXTROSE 5 % IV SOLN
2.0000 g | Freq: Four times a day (QID) | INTRAVENOUS | Status: AC
  Administered 2014-07-22: 2 g via INTRAVENOUS
  Filled 2014-07-22: qty 2

## 2014-07-22 NOTE — Lactation Note (Signed)
This note was copied from the chart of Dana Henry. Lactation Consultation Note Follow up visit at 29 hours of age.  Baby has had 10 feedings with 6 voids and 6 stools.  Mom is unsure of positioning and needs a little encouragement.   Mom continues to work on hand expression with a little colostrum visible.   Baby self latches well when in position.  Baby maintains good rhythm with minimal stimulation needed.  Baby pulled off after 10 minutes and startled, placed on moms chest and burped and back to sleep.  Mom has a striped bruise on tip on right nipple.  Encouraged to keep baby active for feedings and to make sure she has a  Deep latch.  Mom to call RN for assist if she needs or if she is having nipple pain with feedings.  Reviewed feeding frequency of 8-12 feedings with early cues on demand.    Patient Name: Dana Henry [id number] Date: 07/22/2014 Reason for consult: Follow-up assessment   Maternal Data    Feeding Feeding Type: Breast Fed Length of feed: 10 min  LATCH Score/Interventions Latch: Repeated attempts needed to sustain latch, nipple held in mouth throughout feeding, stimulation needed to elicit sucking reflex. Intervention(s): Adjust position;Assist with latch;Breast massage;Breast compression  Audible Swallowing: A few with stimulation Intervention(s): Hand expression;Skin to skin  Type of Nipple: Everted at rest and after stimulation  Comfort (Breast/Nipple): Soft / non-tender     Hold (Positioning): Assistance needed to correctly position infant at breast and maintain latch. Intervention(s): Skin to skin;Position options;Support Pillows;Breastfeeding basics reviewed  LATCH Score: 7  Lactation Tools Discussed/Used     Consult Status Consult Status: Follow-up Date: 07/23/14 Follow-up type: In-patient    Nesanel Aguila, Justine Null 07/22/2014, 5:50 PM

## 2014-07-22 NOTE — Addendum Note (Signed)
Addendum created 07/22/14 7412 by Elenore Paddy, CRNA   Modules edited: Charges VN, Notes Section   Notes Section:  File: [id number]

## 2014-07-22 NOTE — Lactation Note (Signed)
This note was copied from the chart of Dana [name]. Lactation Consultation Note Attempted follow up visit mom on phone and eating baby asleep in MGM's arms.  Asked to call with next feeding for assessment.   Patient Name: Dana Henry [id number] Date: 07/22/2014     Maternal Data    Feeding Feeding Type: Breast Fed Length of feed: 10 min  LATCH Score/Interventions                      Lactation Tools Discussed/Used     Consult Status      Shoptaw, Justine Null 07/22/2014, 4:46 PM

## 2014-07-22 NOTE — Progress Notes (Signed)
POSTOPERATIVE DAY # 1 S/P CS - chorioamnionitis and labor arrest  S:         Reports feeling sore and tired mostly             Tolerating po intake / no nausea / no vomiting / no flatus / no BM             Bleeding is light             Pain controlled withmotrin and percocet now             Up ad lib x 1 / not voiding yet (foley just out this am)  Newborn breast feeding  / female  O:  VS: BP 96/46  Pulse 83  Temp(Src) 98.7 F (37.1 C) (Oral)  Resp 18  Ht 5\' 4"  (1.626 m)  Wt 90.719 kg (200 lb)  BMI 34.31 kg/m2  SpO2 97%  LMP 10/05/2013  Breastfeeding? Unknown              T-max 99.8 in past 24 hours since delivery   LABS:               Recent Labs  07/21/14 1402 07/22/14 0610  WBC 17.7* 13.7*  HGB 11.7* 10.4*  PLT 115* 116*               Bloodtype: --/--/A POS, A POS (10/02 1230)  Rubella: Immune (03/13 0000)                                             I&O: Intake/Output     10/03 0701 - 10/04 0700 10/04 0701 - 10/05 0700   I.V. (mL/kg) 3812 (42)    Total Intake(mL/kg) 3812 (42)    Urine (mL/kg/hr) 1525 (0.7)    Blood 700 (0.3)    Total Output 2225     Net +1587          Urine Occurrence 850 x                 Physical Exam:             Alert and Oriented X3  Lungs: Clear and unlabored  Heart: regular rate and rhythm / no mumurs  Abdomen: soft, non-tender, mildly distended, hypoactive BS             Fundus: firm, non-tender, Ueven             Dressing intact              Foley just out - clear urine in catheter bag             Incision:  approximated with suture and steri-strips / no erythema / no ecchymosis / no drainage  Lochia: light  Extremities: trace edema, no calf pain or tenderness, SCD removed at visit  A:        POD # 1 S/P CS            Chorioamnionitis - afebrile and non-tender  P:        Routine postoperative care              Mefoxin ABX Q6 hours since delivery - complete last dose at 24 hr post-delivery today    Artelia Laroche CNM, MSN,  [facility] 07/22/2014, 11:31 AM

## 2014-07-22 NOTE — Anesthesia Postprocedure Evaluation (Signed)
  Anesthesia Post-op Note  Patient: Dana Henry  Procedure(s) Performed: Procedure(s): CESAREAN SECTION (N/A)  Patient Location: PACU and Mother/Baby  Anesthesia Type:Epidural  Level of Consciousness: awake, alert  and oriented  Airway and Oxygen Therapy: Patient Spontanous Breathing  Post-op Pain: mild  Post-op Assessment: Patient's Cardiovascular Status Stable, Respiratory Function Stable, No signs of Nausea or vomiting, Adequate PO intake, Pain level controlled, No headache, No backache, No residual numbness and No residual motor weakness  Post-op Vital Signs: Reviewed and stable  Last Vitals:  Filed Vitals:   07/22/14 0500  BP: 110/56  Pulse: 91  Temp: 36.7 C  Resp: 18    Complications: No apparent anesthesia complications

## 2014-07-23 ENCOUNTER — Encounter (HOSPITAL_COMMUNITY): Payer: Self-pay | Admitting: Obstetrics & Gynecology

## 2014-07-23 MED ORDER — PNEUMOCOCCAL VAC POLYVALENT 25 MCG/0.5ML IJ INJ
0.5000 mL | INJECTION | INTRAMUSCULAR | Status: AC
  Administered 2014-07-24: 0.5 mL via INTRAMUSCULAR
  Filled 2014-07-23 (×2): qty 0.5

## 2014-07-23 MED ORDER — POLYSACCHARIDE IRON COMPLEX 150 MG PO CAPS
150.0000 mg | ORAL_CAPSULE | Freq: Every day | ORAL | Status: DC
  Administered 2014-07-23: 150 mg via ORAL
  Filled 2014-07-23: qty 1

## 2014-07-23 NOTE — Progress Notes (Addendum)
POD # 2  Subjective: Pt reports feeling well/ Pain controlled with Motrin and Percocet Tolerating po/Voiding without problems/ No n/v/ Flatus present Activity: ad lib Bleeding is light Newborn info:  Information for the patient's newborn:  Tyresa, Prind[place]le Girl Lenola [[id number]]  female Feeding: breast, nipples sore and bruised   Objective: VS:  Filed Vitals:   07/22/14 1058 07/22/14 1103 07/22/14 1830 07/23/14 0651  BP: 110/70 117/58 116/55 100/70  Pulse: 102 112 92 73  Temp:   98.3 F (36.8 C) 98.1 F (36.7 C)  TempSrc:   Oral Oral  Resp: 18 18 18 17   Height:      Weight:      SpO2:        I&O: Intake/Output     10/04 0701 - 10/05 0700 10/05 0701 - 10/06 0700   I.V. (mL/kg)     Total Intake(mL/kg)     Urine (mL/kg/hr) 300 (0.1)    Blood     Total Output 300     Net -300            LABS:  Recent Labs  07/21/14 1402 07/22/14 0610  WBC 17.7* 13.7*  HGB 11.7* 10.4*  HCT 33.5* 29.7*  PLT 115* 116*    Blood type: --/--/A POS, A POS (10/02 1230) Rubella: Immune (03/13 0000)     Physical Exam:  General: alert and cooperative CV: Regular rate and rhythm Resp: CTA bilaterally Breast: nipples pink with mild brusing Abdomen: soft, nontender, normal bowel sounds Uterine Fundus: firm, below umbilicus, nontender Incision: Covered with Tegaderm and honeycomb dressing; no significant drainage, edema, bruising, or erythema; well approximated with suture Lochia: minimal Ext: extremities normal, atraumatic, no cyanosis or edema and Homans sign is negative, no sign of DVT    Assessment/: POD # 2/ G1P1001/ S/P C/Section d/t arrest of labor, chorio ABL anemia Lactation disorder Thrombocytopenia-stable Doing well  Plan: Lactation consult for assessment and recommendations Continue routine post op orders Anticipate discharge home tomorrow   Signed: Julianne Handler, Delane Ginger, MSN, CNM 07/23/2014, 10:01 AM

## 2014-07-23 NOTE — Lactation Note (Signed)
This note was copied from the chart of Dana [name]. Lactation Consultation Note: Mom called out for assist,Baby very fussy, Mom with sore nipples. Both nipples pink and raw on tips, some cracking noted. Mom reports that right nipple is the worst. Calmed baby and assisted mom in football hold on left breast. Reviewed basic teaching- wide open mouth and keeping the baby close to  the breast throughout the feeding. Mom reports this is the most comfortable she has been- still some pain but not nearly as bad. Nipple round when baby came off the breast. Did not use NS. Mom has comfort gels for nipples-placed them on nipples after feeding and mom reports they feel great!. No questions at present. To call for assist prn.   Patient Name: Dana Henry'F Date: 07/23/2014 Reason for consult: Follow-up assessment   Maternal Data Formula Feeding for Exclusion: No Has patient been taught Hand Expression?: Yes Does the patient have breastfeeding experience prior to this delivery?: No  Feeding Feeding Type: Breast Fed Length of feed: 18 min  LATCH Score/Interventions Latch: Grasps breast easily, tongue down, lips flanged, rhythmical sucking.  Audible Swallowing: Spontaneous and intermittent  Type of Nipple: Everted at rest and after stimulation  Comfort (Breast/Nipple): Filling, red/small blisters or bruises, mild/mod discomfort  Problem noted: Mild/Moderate discomfort Interventions (Mild/moderate discomfort): Comfort gels  Hold (Positioning): Assistance needed to correctly position infant at breast and maintain latch. Intervention(s): Breastfeeding basics reviewed;Support Pillows;Position options;Skin to skin  LATCH Score: 8  Lactation Tools Discussed/Used     Consult Status Consult Status: Follow-up Date: 07/24/14 Follow-up type: In-patient    Truddie Crumble 07/23/2014, 1:59 PM

## 2014-07-23 NOTE — Progress Notes (Signed)
UR chart review completed.  

## 2014-07-23 NOTE — Progress Notes (Signed)
RN in room and nipple shield #24 given to mother and instructed in use . Comfort gels given and instructed in use. Pt nipples sore, cracked and bruised and tender. Infnat good strong suck on shield, no colostrum sseen

## 2014-07-23 NOTE — Progress Notes (Signed)
RN in room and FOB expressed desire to bottle feed. RN explained to parents that bottle feeding would delay milk production from coming in and and the benefits of breastfeeding out weighted formula. Pt understood and FOB agreed to hold off formula feeding for now,. Pt nipples sore, bruised, tender, and cracked . Nipple shield #24 given and comfort gels to help sooth nipples.

## 2014-07-24 MED ORDER — OXYCODONE-ACETAMINOPHEN 5-325 MG PO TABS: 1.0000 | ORAL_TABLET | ORAL | Status: DC | PRN

## 2014-07-24 MED ORDER — IBUPROFEN 600 MG PO TABS: 600.0000 mg | ORAL_TABLET | Freq: Four times a day (QID) | ORAL | Status: DC

## 2014-07-24 NOTE — Progress Notes (Signed)
POSTOPERATIVE DAY # 3 S/P Cs  S:         Reports feeling well - just soreness             Tolerating po intake / no nausea / no vomiting / + flatus / no BM             Bleeding is light             Pain controlled with motrin and percocet             Up ad lib / ambulatory/ voiding QS  Newborn breast feeding   O:  VS: BP 115/62  Pulse 85  Temp(Src) 98.4 F (36.9 C) (Oral)  Resp 18  Ht 5\' 4"  (1.626 m)  Wt 90.719 kg (200 lb)  BMI 34.31 kg/m2  SpO2 97%  LMP 10/05/2013  Breastfeeding? Unknown   LABS:               Recent Labs  07/21/14 1402 07/22/14 0610  WBC 17.7* 13.7*  HGB 11.7* 10.4*  PLT 115* 116*               Bloodtype: --/--/A POS, A POS (10/02 1230)  Rubella: Immune (03/13 0000)                                Physical Exam:             Alert and Oriented X3  Abdomen: soft, non-tender, non-distended, active FM             Fundus: firm, non-tender, U-1             Dressing intact honeycomb              Incision:  approximated with sutures and steri-strips / no erythema / no ecchymosis / no drainage  Perineum: intact  Lochia: light  Extremities: trace edema, no calf pain or tenderness, negative Homans  A:        POD # 3 S/P CS            Thrombocytopenia - stable            Mild ABL anemia - stable  P:        Routine postoperative care              DC home             WOB booklet - instructions reviewed   Artelia Laroche CNM, MSN, [place] 07/24/2014, 9:20 AM

## 2014-07-24 NOTE — Discharge Summary (Signed)
POSTOPERATIVE DISCHARGE SUMMARY:  Patient ID: Dana Henry MRN: [id number] DOB/AGE: 1981/01/12 33 y.o.  Admit date: 07/20/2014 Admission Diagnoses: 41.2 weeks / LGA / early labor  Discharge date:  07/24/2014 Discharge Diagnoses: POD 3 s/p CS - arrest of active labor at 8 cm / chorioamnionitis / mild ABL anemia / thrombocytopenia - stable  Prenatal history: G1P1001   EDC : 07/12/2014, by Last Menstrual Period  Prenatal care at [facility]  Primary provider : Mody Prenatal course complicated by post-date / LGA  Prenatal Labs: ABO, Rh: --/--/A POS, A POS (10/02 1230) Antibody: NEG (10/02 1230) Rubella: Immune (03/13 0000)  RPR: NON REAC (10/02 1230)  HBsAg: Negative (03/13 0000)  HIV: Non-reactive (03/13 0000)  GTT : NL GBS: Negative (09/03 0000)   Medical / Surgical History :  Past medical history:  Past Medical History  Diagnosis Date  . Migraines     associated with menstrual cycle  . UTI (urinary tract infection)   . Kidney stones     surgically removed at 33YO and 33YO  . Vaginal Pap smear, abnormal     Past surgical history:  Past Surgical History  Procedure Laterality Date  . Kidney stone surgery    . Tonsillectomy and adenoidectomy  1991  . Cesarean section N/A 07/21/2014    Procedure: CESAREAN SECTION;  Surgeon: Elveria Royals, MD;  Location: [facility];  Service: Obstetrics;  Laterality: N/A;    Family History:  Family History  Problem Relation Age of Onset  . Hypertension Mother   . Alcohol abuse Father     Social History:  reports that she quit smoking about 9 months ago. Her smoking use included Cigarettes. She smoked 0.50 packs per day. She has never used smokeless tobacco. She reports that she does not drink alcohol or use illicit drugs.  Allergies: Sulfa antibiotics   Current Medications at time of admission:  Prior to Admission medications   Medication Sig Start Date End Date Taking? Authorizing Provider  calcium carbonate  (TUMS - DOSED IN MG ELEMENTAL CALCIUM) 500 MG chewable tablet Chew 1 tablet by mouth daily.   Yes Historical Provider, MD  Emollient (AVEENO CREAMY MOISTURIZING EX) Apply 1 application topically as needed.   Yes Historical Provider, MD  Nutritional Supplements (JUICE PLUS FIBRE PO) Take 1 tablet by mouth daily.   Yes Historical Provider, MD  Prenatal Vit-Fe Fumarate-FA (PRENATAL MULTIVITAMIN) TABS tablet Take 1 tablet by mouth daily at 12 noon.   Yes Historical Provider, MD  ibuprofen (ADVIL,MOTRIN) 600 MG tablet Take 1 tablet (600 mg total) by mouth every 6 (six) hours. 07/24/14   Artelia Laroche, CNM  oxyCODONE-acetaminophen (PERCOCET/ROXICET) 5-325 MG per tablet Take 1 tablet by mouth every 4 (four) hours as needed (for pain scale less than 7). 07/24/14   Artelia Laroche, CNM    Intrapartum Course:  Admit for onset of latent labor with labor progression to 8 dilation with protracted labor curve - no progression in over 6 hours / febrile intrapartum / MSF in active labor Pain management: epidural Complicated by: arrest if dilation Interventions required: cesarean section  Procedures: Cesarean section delivery on 07/21/2014 with delivery of  female newborn by Dr Benjie Karvonen   See operative report for further details APGAR (1 MIN): 9   APGAR (5 MINS): 9    Postoperative / postpartum course:  Uncomplicated with discharge on POD #3  Discharge Instructions:  Discharged Condition: stable  Activity: pelvic rest and postoperative restrictions x 2   Diet: routine  Medications:    Medication List         AVEENO CREAMY MOISTURIZING EX  Apply 1 application topically as needed.     calcium carbonate 500 MG chewable tablet  Commonly known as:  TUMS - dosed in mg elemental calcium  Chew 1 tablet by mouth daily.     ibuprofen 600 MG tablet  Commonly known as:  ADVIL,MOTRIN  Take 1 tablet (600 mg total) by mouth every 6 (six) hours.     JUICE PLUS FIBRE PO  Take 1 tablet by mouth daily.      oxyCODONE-acetaminophen 5-325 MG per tablet  Commonly known as:  PERCOCET/ROXICET  Take 1 tablet by mouth every 4 (four) hours as needed (for pain scale less than 7).     prenatal multivitamin Tabs tablet  Take 1 tablet by mouth daily at 12 noon.        Wound Care: keep clean and dry / remove honeycomb POD 5 Postpartum Instructions: [facility] discharge booklet - instructions reviewed  Discharge to: Home  Follow up :   [facility] in 6 weeks for routine postpartum visit with Dr Benjie Karvonen                Signed: Artelia Laroche CNM, MSN, [facility] 07/24/2014, 9:25 AM

## 2014-07-26 NOTE — Discharge Summary (Signed)
Reviewed and agree with note and plan. V.Valgene Deloatch, MD  

## 2014-08-20 ENCOUNTER — Encounter (HOSPITAL_COMMUNITY): Payer: Self-pay | Admitting: Obstetrics & Gynecology

## 2014-09-25 LAB — HM PAP SMEAR

## 2015-01-23 ENCOUNTER — Ambulatory Visit (INDEPENDENT_AMBULATORY_CARE_PROVIDER_SITE_OTHER): Payer: 59 | Admitting: Nurse Practitioner

## 2015-01-23 ENCOUNTER — Encounter: Payer: Self-pay | Admitting: Nurse Practitioner

## 2015-01-23 VITALS — BP 118/74 | HR 88 | Temp 98.5°F | Resp 14 | Ht 65.0 in | Wt 175.1 lb

## 2015-01-23 DIAGNOSIS — J069 Acute upper respiratory infection, unspecified: Secondary | ICD-10-CM | POA: Diagnosis not present

## 2015-01-23 MED ORDER — AMOXICILLIN 500 MG PO CAPS: 500.0000 mg | ORAL_CAPSULE | Freq: Two times a day (BID) | ORAL | Status: DC

## 2015-01-23 NOTE — Progress Notes (Signed)
   Subjective:    Patient ID: Dana Henry, female    DOB: 03/01/1981, 34 y.o.   MRN: [id number]  HPI  Ms. Karnes is a 34 yo female with a CC of cough, night sweats, and sore throat x 5 days   1) Last Thursday, noticed nasal congestion, cough-dry Went to bed freezing, sweating like breaking a fever. Fatigue.  Sore throat.    Mucinex- green productive cough  Tylenol severe cold  Delsym- helpful   Review of Systems  Constitutional: Positive for chills, diaphoresis and fatigue. Negative for fever.  HENT: Positive for postnasal drip and rhinorrhea. Negative for ear discharge, ear pain, sinus pressure and sneezing.   Eyes: Negative for discharge, itching and visual disturbance.  Respiratory: Positive for cough.   Neurological: Positive for headaches.      Objective:   Physical Exam  Constitutional: She is oriented to person, place, and time. She appears well-developed and well-nourished. No distress.  BP 118/74 mmHg  Pulse 88  Temp(Src) 98.5 F (36.9 C) (Oral)  Resp 14  Ht 5\' 5"  (1.651 m)  Wt 175 lb 1.9 oz (79.434 kg)  BMI 29.14 kg/m2  SpO2 98%   HENT:  Head: Normocephalic and atraumatic.  Right Ear: External ear normal.  Left Ear: External ear normal.  Mouth/Throat: No oropharyngeal exudate.  Oropharynx is red  Eyes: Right eye exhibits no discharge. Left eye exhibits no discharge. No scleral icterus.  Cardiovascular: Normal rate, regular rhythm, normal heart sounds and intact distal pulses.  Exam reveals no gallop and no friction rub.   No murmur heard. Pulmonary/Chest: Effort normal and breath sounds normal. No respiratory distress. She has no wheezes. She has no rales. She exhibits no tenderness.  Neurological: She is alert and oriented to person, place, and time. No cranial nerve deficit. She exhibits normal muscle tone. Coordination normal.  Skin: Skin is warm and dry. No rash noted. She is not diaphoretic.  Psychiatric: She has a normal mood and affect. Her  behavior is normal. Judgment and thought content normal.      Assessment & Plan:

## 2015-01-23 NOTE — Progress Notes (Signed)
Pre visit review using our clinic review tool, if applicable. No additional management support is needed unless otherwise documented below in the visit note. 

## 2015-01-23 NOTE — Patient Instructions (Signed)
Take the antibiotic if between 7-10 days with no relief.   Call us if not improved after all measures.   Drink lots of water  Rest (as much as you can)  Delsym and Tylenol okay

## 2015-02-02 DIAGNOSIS — J069 Acute upper respiratory infection, unspecified: Secondary | ICD-10-CM | POA: Insufficient documentation

## 2015-02-02 NOTE — Assessment & Plan Note (Signed)
Due to severity of symptoms will treat with amoxicillin 500 mg twice daily for 5 days. Asked pt to take a probiotic OTC while on abx. Delsym for cough, rest, and drink fluids.

## 2015-06-27 ENCOUNTER — Ambulatory Visit (INDEPENDENT_AMBULATORY_CARE_PROVIDER_SITE_OTHER): Payer: 59 | Admitting: Internal Medicine

## 2015-06-27 ENCOUNTER — Encounter (INDEPENDENT_AMBULATORY_CARE_PROVIDER_SITE_OTHER): Payer: Self-pay

## 2015-06-27 ENCOUNTER — Encounter: Payer: Self-pay | Admitting: Internal Medicine

## 2015-06-27 VITALS — BP 109/74 | HR 79 | Temp 97.9°F | Ht 65.25 in | Wt 177.4 lb

## 2015-06-27 DIAGNOSIS — Z23 Encounter for immunization: Secondary | ICD-10-CM | POA: Diagnosis not present

## 2015-06-27 DIAGNOSIS — Z0000 Encounter for general adult medical examination without abnormal findings: Secondary | ICD-10-CM | POA: Diagnosis not present

## 2015-06-27 LAB — CBC WITH DIFFERENTIAL/PLATELET
BASOS PCT: 0.6 % (ref 0.0–3.0)
Basophils Absolute: 0 10*3/uL (ref 0.0–0.1)
Eosinophils Absolute: 0.2 10*3/uL (ref 0.0–0.7)
Eosinophils Relative: 3.7 % (ref 0.0–5.0)
HCT: 38.5 % (ref 36.0–46.0)
HEMOGLOBIN: 13 g/dL (ref 12.0–15.0)
LYMPHS ABS: 2.2 10*3/uL (ref 0.7–4.0)
Lymphocytes Relative: 37.2 % (ref 12.0–46.0)
MCHC: 33.7 g/dL (ref 30.0–36.0)
MCV: 89.1 fl (ref 78.0–100.0)
Monocytes Absolute: 0.3 10*3/uL (ref 0.1–1.0)
Monocytes Relative: 4.9 % (ref 3.0–12.0)
Neutro Abs: 3.2 10*3/uL (ref 1.4–7.7)
Neutrophils Relative %: 53.6 % (ref 43.0–77.0)
Platelets: 246 10*3/uL (ref 150.0–400.0)
RBC: 4.32 Mil/uL (ref 3.87–5.11)
RDW: 13.5 % (ref 11.5–15.5)
WBC: 5.9 10*3/uL (ref 4.0–10.5)

## 2015-06-27 LAB — COMPREHENSIVE METABOLIC PANEL
ALT: 14 U/L (ref 0–35)
AST: 14 U/L (ref 0–37)
Albumin: 4.3 g/dL (ref 3.5–5.2)
Alkaline Phosphatase: 49 U/L (ref 39–117)
BUN: 15 mg/dL (ref 6–23)
CALCIUM: 9.3 mg/dL (ref 8.4–10.5)
CHLORIDE: 107 meq/L (ref 96–112)
CO2: 25 mEq/L (ref 19–32)
Creatinine, Ser: 0.75 mg/dL (ref 0.40–1.20)
GFR: 93.62 mL/min (ref >60.00)
Glucose, Bld: 82 mg/dL (ref 70–99)
POTASSIUM: 4 meq/L (ref 3.5–5.1)
SODIUM: 139 meq/L (ref 135–145)
Total Bilirubin: 0.3 mg/dL (ref 0.2–1.2)
Total Protein: 6.9 g/dL (ref 6.0–8.3)

## 2015-06-27 LAB — LIPID PANEL
Cholesterol: 157 mg/dL (ref 0–200)
HDL: 47.1 mg/dL (ref >39.00)
LDL CALC: 92 mg/dL (ref 0–99)
NonHDL: 109.6
TRIGLYCERIDES: 87 mg/dL (ref 0.0–149.0)
Total CHOL/HDL Ratio: 3
VLDL: 17.4 mg/dL (ref 0.0–40.0)

## 2015-06-27 LAB — VITAMIN D 25 HYDROXY (VIT D DEFICIENCY, FRACTURES): VITD: 25.6 ng/mL — ABNORMAL LOW (ref 30.00–100.00)

## 2015-06-27 LAB — TSH: TSH: 1.3 u[IU]/mL (ref 0.35–4.50)

## 2015-06-27 NOTE — Patient Instructions (Signed)

## 2015-06-27 NOTE — Progress Notes (Signed)
Pre visit review using our clinic review tool, if applicable. No additional management support is needed unless otherwise documented below in the visit note. 

## 2015-06-27 NOTE — Assessment & Plan Note (Signed)
General medical exam normal today. Breast and pelvic deferred as completed by OB. Flu vaccine today. Labs as ordered. Encouraged her to take time for herself. Encouraged healthy diet and exercise.

## 2015-06-27 NOTE — Progress Notes (Signed)
Subjective:    Patient ID: Dana Henry, female    DOB: [birth year], 34 y.o.   MRN: [id number]  HPI  34YO female presents for annual exam. Last seen by me in 2013.  Feeling well. Had her first child last October. Continues to breast feed. Following healthy diet and staying active. PAP was completed last December and was normal.  Wt Readings from Last 3 Encounters:  06/27/15 177 lb 6 oz (80.457 kg)  01/23/15 175 lb 1.9 oz (79.434 kg)  07/20/14 200 lb (90.719 kg)   BP Readings from Last 3 Encounters:  06/27/15 109/74  01/23/15 118/74  07/24/14 115/62       Past Medical History  Diagnosis Date  . Migraines     associated with menstrual cycle  . UTI (urinary tract infection)   . Kidney stones     surgically removed at 34YO and 34YO  . Vaginal Pap smear, abnormal    Family History  Problem Relation Age of Onset  . Hypertension Mother   . Alcohol abuse Father    Past Surgical History  Procedure Laterality Date  . Kidney stone surgery    . Tonsillectomy and adenoidectomy  1991  . Cesarean section N/A 07/21/2014    Procedure: CESAREAN SECTION;  Surgeon: Elveria Royals, MD;  Location: [facility];  Service: Obstetrics;  Laterality: N/A;   Social History   Social History  . Marital Status: Married    Spouse Name: Esme Durkin  . Number of Children: N/A  . Years of Education: College   Occupational History  . sales    Social History Main Topics  . Smoking status: Former Smoker -- 0.50 packs/day    Types: Cigarettes    Quit date: 10/19/2013  . Smokeless tobacco: Never Used  . Alcohol Use: No  . Drug Use: No  . Sexual Activity: Yes    Birth Control/ Protection: None   Other Topics Concern  . None   Social History Narrative   Lives in [place] with husband and mother and father and mother in [place]. Daughter 52months, [employer].      Work - Manufacturing systems engineer in [employer]      Diet - regular   Exercise - occasional walks      Caffinated Beverages: Yes   Herbal Remedies: No   Seat Belts: Yes   Bike Helmet: Yes   Exercise 3 Times a Week: No   Vegetarian: No   Eat Dairy Products:   Take Vitamins: Yes   Use Hearing Aid: No   Wear Dentures: No   Smoke Alarms in Home: Yes   Guns/Firearms: No   Physical Abuse: No      Hours of Sleep: 6-7   # of People in Home: 3                   Review of Systems  Constitutional: Negative for fever, chills, appetite change, fatigue and unexpected weight change.  Eyes: Negative for visual disturbance.  Respiratory: Negative for shortness of breath.   Cardiovascular: Negative for chest pain and leg swelling.  Gastrointestinal: Negative for nausea, vomiting, abdominal pain, diarrhea and constipation.  Musculoskeletal: Negative for myalgias and arthralgias.  Skin: Negative for color change and rash.  Neurological: Negative for weakness and numbness.  Hematological: Negative for adenopathy. Does not bruise/bleed easily.  Psychiatric/Behavioral: Negative for suicidal ideas, sleep disturbance and dysphoric mood. The patient is not nervous/anxious.        Objective:    BP 109/74  mmHg  Pulse 79  Temp(Src) 97.9 F (36.6 C) (Oral)  Ht 5' 5.25" (1.657 m)  Wt 177 lb 6 oz (80.457 kg)  BMI 29.30 kg/m2  SpO2 98%  LMP 06/13/2015 (Approximate)  Breastfeeding? Yes Physical Exam  Constitutional: She is oriented to person, place, and time. She appears well-developed and well-nourished. No distress.  HENT:  Head: Normocephalic and atraumatic.  Right Ear: External ear normal.  Left Ear: External ear normal.  Nose: Nose normal.  Mouth/Throat: Oropharynx is clear and moist. No oropharyngeal exudate.  Eyes: Conjunctivae and EOM are normal. Pupils are equal, round, and reactive to light. Right eye exhibits no discharge.  Neck: Normal range of motion. Neck supple. No thyromegaly present.  Cardiovascular: Normal rate, regular rhythm, normal heart sounds and intact distal pulses.  Exam reveals no gallop and no  friction rub.   No murmur heard. Pulmonary/Chest: Effort normal. No respiratory distress. She has no wheezes. She has no rales.  Abdominal: Soft. Bowel sounds are normal. She exhibits no distension and no mass. There is no tenderness. There is no rebound and no guarding.  Musculoskeletal: Normal range of motion. She exhibits no edema or tenderness.  Lymphadenopathy:    She has no cervical adenopathy.  Neurological: She is alert and oriented to person, place, and time. No cranial nerve deficit. Coordination normal.  Skin: Skin is warm and dry. No rash noted. She is not diaphoretic. No erythema. No pallor.     Psychiatric: She has a normal mood and affect. Her behavior is normal. Judgment and thought content normal.          Assessment & Plan:   Problem List Items Addressed This Visit      Unprioritized   Routine general medical examination at a health care facility - Primary    General medical exam normal today. Breast and pelvic deferred as completed by OB. Flu vaccine today. Labs as ordered. Encouraged her to take time for herself. Encouraged healthy diet and exercise.      Relevant Orders   TSH   CBC with Differential/Platelet   Comprehensive metabolic panel   Lipid panel   Vit D  25 hydroxy (rtn osteoporosis monitoring)   Ambulatory referral to Dermatology       Return in about 1 year (around 06/26/2016) for Physical.

## 2015-07-02 ENCOUNTER — Encounter: Payer: Self-pay | Admitting: *Deleted

## 2016-10-08 LAB — HM PAP SMEAR: HM Pap smear: NORMAL

## 2016-10-19 HISTORY — DX: Maternal care for unspecified type scar from previous cesarean delivery: O34.219

## 2016-10-20 ENCOUNTER — Telehealth: Payer: Self-pay | Admitting: Behavioral Health

## 2016-10-20 ENCOUNTER — Encounter: Payer: Self-pay | Admitting: Behavioral Health

## 2016-10-20 NOTE — Telephone Encounter (Signed)
Pre-Visit Call completed with patient and chart updated.   Pre-Visit Info documented in Specialty Comments under SnapShot.    

## 2016-10-21 ENCOUNTER — Ambulatory Visit (INDEPENDENT_AMBULATORY_CARE_PROVIDER_SITE_OTHER): Payer: PRIVATE HEALTH INSURANCE | Admitting: Family Medicine

## 2016-10-21 ENCOUNTER — Encounter: Payer: Self-pay | Admitting: Family Medicine

## 2016-10-21 VITALS — BP 112/64 | HR 68 | Temp 98.4°F | Ht 65.0 in | Wt 174.8 lb

## 2016-10-21 DIAGNOSIS — Z13220 Encounter for screening for lipoid disorders: Secondary | ICD-10-CM

## 2016-10-21 DIAGNOSIS — Z131 Encounter for screening for diabetes mellitus: Secondary | ICD-10-CM

## 2016-10-21 DIAGNOSIS — Z0000 Encounter for general adult medical examination without abnormal findings: Secondary | ICD-10-CM | POA: Diagnosis not present

## 2016-10-21 DIAGNOSIS — Z1329 Encounter for screening for other suspected endocrine disorder: Secondary | ICD-10-CM | POA: Diagnosis not present

## 2016-10-21 DIAGNOSIS — Z130 Encounter for screening for diseases of the blood and blood-forming organs and certain disorders involving the immune mechanism: Secondary | ICD-10-CM

## 2016-10-21 DIAGNOSIS — Z23 Encounter for immunization: Secondary | ICD-10-CM

## 2016-10-21 DIAGNOSIS — R229 Localized swelling, mass and lump, unspecified: Secondary | ICD-10-CM

## 2016-10-21 NOTE — Progress Notes (Signed)
[place] at [facility] [address] [place], [place] [address] [phone] [phone]  Date:  10/21/2016   Name:  Dana Henry   DOB:  05/26/1981   MRN:  [id number]  PCP:  Rica Mast, MD    Chief Complaint: No chief complaint on file.   History of Present Illness:  Tenly Trolinger is a 36 y.o. very pleasant female patient who presents with the following:  She is here today as a new patient- she is generally in good health  She has a distant history of kidney stones but none recently  She is married,has a 69 yo daughter who is in good health She works in Press [employer]- full time.   Tonsils and adenoids removed as a young child  She does see OBG- Dr. Benjie Karvonen, she saw her just a couple of weeks ago Flu shot done today No recent labs - she would like to do today She is not fasting today- she did eat lunch earlier today She needs a wellness exam today per her new insurance company. However she does not have any form to complete She does not get a lot of exercise as she is very busy between her job and family. She would like to do a better job in this regard  Flu shot today  Patient Active Problem List   Diagnosis Date Noted  . Routine general medical examination at a health care facility 06/27/2015  . Migraines 10/06/2012    Past Medical History:  Diagnosis Date  . Kidney stones    surgically removed at 36YO and 36YO  . Migraines    associated with menstrual cycle  . UTI (urinary tract infection)   . Vaginal Pap smear, abnormal     Past Surgical History:  Procedure Laterality Date  . CESAREAN SECTION N/A 07/21/2014   Procedure: CESAREAN SECTION;  Surgeon: Elveria Royals, MD;  Location: [facility];  Service: Obstetrics;  Laterality: N/A;  . KIDNEY STONE SURGERY    . TONSILLECTOMY AND ADENOIDECTOMY  1991    Social History  Substance Use Topics  . Smoking status: Former Smoker    Packs/day: 0.50    Types: Cigarettes    Quit  date: 10/19/2013  . Smokeless tobacco: Never Used  . Alcohol use No    Family History  Problem Relation Age of Onset  . Hypertension Mother   . Alcohol abuse Father     Allergies  Allergen Reactions  . Sulfa Antibiotics Hives and Swelling    Medication list has been reviewed and updated.  Current Outpatient Prescriptions on File Prior to Visit  Medication Sig Dispense Refill  . ibuprofen (ADVIL,MOTRIN) 600 MG tablet Take 1 tablet (600 mg total) by mouth every 6 (six) hours. 30 tablet 0  . Nutritional Supplements (JUICE PLUS FIBRE PO) Take 1 tablet by mouth daily.    . Prenatal Vit-Fe Fumarate-FA (PRENATAL MULTIVITAMIN) TABS tablet Take 1 tablet by mouth daily at 12 noon.     No current facility-administered medications on file prior to visit.     Review of Systems:  As per HPI- otherwise negative. She has noted a few firm nodules on her skin- she has one on each wrist and one on each shin.  They have been present for some time- years    Physical Examination: Blood pressure 112/64, pulse 68, temperature 98.4 F (36.9 C), temperature source Oral, height 5\' 5"  (1.651 m), weight 174 lb 12.8 oz (79.3 kg), last menstrual  period 10/16/2016, SpO2 99 %, currently breastfeeding. Body mass index is 29.09 kg/m.   GEN: WDWN, NAD, Non-toxic, A & O x 3 HEENT: Atraumatic, Normocephalic. Neck supple. No masses, No LAD. Ears and Nose: No external deformity. CV: RRR, No M/G/R. No JVD. No thrill. No extra heart sounds. PULM: CTA B, no wheezes, crackles, rhonchi. No retractions. No resp. distress. No accessory muscle use. ABD: S, NT, ND, +BS. No rebound. No HSM. EXTR: No c/c/e NEURO Normal gait.  PSYCH: Normally interactive. Conversant. Not depressed or anxious appearing.  Calm demeanor.    Assessment and Plan: Wellness examination  Screening for deficiency anemia - Plan: CBC  Screening for diabetes mellitus - Plan: Comprehensive metabolic panel, Hemoglobin A1c  Screening for  thyroid disorder - Plan: TSH  Screening for hyperlipidemia - Plan: Lipid panel  Well woman- a bit overweight, she would like to exercise more Will plan further follow- up pending labs.   Signed Lamar Blinks, MD

## 2016-10-21 NOTE — Patient Instructions (Addendum)
It was a pleasure to see you today- I will be in touch with your labs asap Do try and exercise when you can- even just a few minutes here and there can add up Your blood pressure looks fine today and you got a flu shot today  Please come and see me in the next few months for a follow-up visit- please ask for a 30 minute time slot so we can biopsy a couple of your skin nodules

## 2016-10-22 ENCOUNTER — Encounter: Payer: Self-pay | Admitting: Family Medicine

## 2016-10-22 LAB — LIPID PANEL
CHOLESTEROL: 185 mg/dL (ref 0–200)
HDL: 45.7 mg/dL (ref >39.00)
NonHDL: 139.01
Total CHOL/HDL Ratio: 4
Triglycerides: 207 mg/dL — ABNORMAL HIGH (ref 0.0–149.0)
VLDL: 41.4 mg/dL — AB (ref 0.0–40.0)

## 2016-10-22 LAB — COMPREHENSIVE METABOLIC PANEL
ALBUMIN: 4.6 g/dL (ref 3.5–5.2)
ALT: 20 U/L (ref 0–35)
AST: 16 U/L (ref 0–37)
Alkaline Phosphatase: 31 U/L — ABNORMAL LOW (ref 39–117)
BUN: 11 mg/dL (ref 6–23)
CALCIUM: 9.4 mg/dL (ref 8.4–10.5)
CO2: 28 meq/L (ref 19–32)
Chloride: 108 mEq/L (ref 96–112)
Creatinine, Ser: 0.83 mg/dL (ref 0.40–1.20)
GFR: 82.66 mL/min (ref >60.00)
Glucose, Bld: 72 mg/dL (ref 70–99)
Potassium: 3.9 mEq/L (ref 3.5–5.1)
Sodium: 141 mEq/L (ref 135–145)
Total Bilirubin: 0.3 mg/dL (ref 0.2–1.2)
Total Protein: 7.3 g/dL (ref 6.0–8.3)

## 2016-10-22 LAB — HEMOGLOBIN A1C: HEMOGLOBIN A1C: 5.4 % (ref 4.6–6.5)

## 2016-10-22 LAB — CBC
HEMATOCRIT: 37.3 % (ref 36.0–46.0)
Hemoglobin: 12.7 g/dL (ref 12.0–15.0)
MCHC: 34.1 g/dL (ref 30.0–36.0)
MCV: 88.3 fl (ref 78.0–100.0)
PLATELETS: 222 10*3/uL (ref 150.0–400.0)
RBC: 4.23 Mil/uL (ref 3.87–5.11)
RDW: 13.1 % (ref 11.5–15.5)
WBC: 5.4 10*3/uL (ref 4.0–10.5)

## 2016-10-22 LAB — TSH: TSH: 0.85 u[IU]/mL (ref 0.35–4.50)

## 2016-10-22 LAB — LDL CHOLESTEROL, DIRECT: LDL DIRECT: 121 mg/dL

## 2016-12-17 ENCOUNTER — Ambulatory Visit (INDEPENDENT_AMBULATORY_CARE_PROVIDER_SITE_OTHER): Payer: PRIVATE HEALTH INSURANCE | Admitting: Family Medicine

## 2016-12-17 VITALS — BP 122/63 | HR 91 | Temp 98.9°F | Ht 65.0 in | Wt 175.2 lb

## 2016-12-17 DIAGNOSIS — D2362 Other benign neoplasm of skin of left upper limb, including shoulder: Secondary | ICD-10-CM

## 2016-12-17 DIAGNOSIS — D2372 Other benign neoplasm of skin of left lower limb, including hip: Secondary | ICD-10-CM | POA: Diagnosis not present

## 2016-12-17 DIAGNOSIS — R229 Localized swelling, mass and lump, unspecified: Secondary | ICD-10-CM

## 2016-12-17 NOTE — Patient Instructions (Signed)
WOUND CARE  . Keep area clean and dry for 24 hours. Do not remove bandage, if applied. . After 24 hours, remove bandage and wash wound gently with mild soap and warm water. Reapply a new bandage after cleaning wound, if directed. . Continue daily cleansing with soap and water until stitches/staples are removed. . Do not apply any ointments or creams to the wound while stitches/staples are in place, as this may cause delayed healing. . Notify the office if you experience any of the following signs of infection: Swelling, redness, pus drainage, streaking, fever >101.0 F . Notify the office if you experience excessive bleeding that does not stop after 15-20 minutes of constant, firm pressure.   

## 2016-12-17 NOTE — Progress Notes (Signed)
[place] at [facility] [address] [place], [place] [address] [phone]  Date:  12/17/2016   Name:  Dana Henry   DOB:  10-18-1981   MRN:  [id number]  PCP:  Lamar Blinks, MD    Chief Complaint: Nevus (Pt here for mole removal. )   History of Present Illness:  Dana Henry is a 36 y.o. very pleasant female patient who presents with the following:  Generally healthy young woman who was seen last month for her CPE. At that time she had noted some skin nodules which were concerning to her.  They have been present for several years but do seem to be getting larger  We decided to bx a couple of these lesions for her today- on her left arm and on her left leg She is otherwise feeling well and is generally in good health She does not suspect current pregnancy    Patient Active Problem List   Diagnosis Date Noted  . Routine general medical examination at a health care facility 06/27/2015  . Migraines 10/06/2012    Past Medical History:  Diagnosis Date  . Kidney stones    surgically removed at 36YO and 36YO  . Migraines    associated with menstrual cycle  . UTI (urinary tract infection)   . Vaginal Pap smear, abnormal     Past Surgical History:  Procedure Laterality Date  . CESAREAN SECTION N/A 07/21/2014   Procedure: CESAREAN SECTION;  Surgeon: Elveria Royals, MD;  Location: [facility];  Service: Obstetrics;  Laterality: N/A;  . KIDNEY STONE SURGERY    . TONSILLECTOMY AND ADENOIDECTOMY  1991    Social History  Substance Use Topics  . Smoking status: Former Smoker    Packs/day: 0.50    Types: Cigarettes    Quit date: 10/19/2013  . Smokeless tobacco: Never Used  . Alcohol use No    Family History  Problem Relation Age of Onset  . Hypertension Mother   . Alcohol abuse Father     Allergies  Allergen Reactions  . Sulfa Antibiotics Hives and Swelling    Medication list has been reviewed and  updated.  Current Outpatient Prescriptions on File Prior to Visit  Medication Sig Dispense Refill  . ibuprofen (ADVIL,MOTRIN) 600 MG tablet Take 1 tablet (600 mg total) by mouth every 6 (six) hours. 30 tablet 0  . Nutritional Supplements (JUICE PLUS FIBRE PO) Take 1 tablet by mouth daily.    . Prenatal Vit-Fe Fumarate-FA (PRENATAL MULTIVITAMIN) TABS tablet Take 1 tablet by mouth daily at 12 noon.     No current facility-administered medications on file prior to visit.     Review of Systems:  As per HPI- otherwise negative.   Physical Examination: Vitals:   12/17/16 1419  BP: 122/63  Pulse: 91  Temp: 98.9 F (37.2 C)   Vitals:   12/17/16 1419  Weight: 175 lb 3.2 oz (79.5 kg)   Body mass index is 29.15 kg/m. Ideal Body Weight:     GEN: WDWN, NAD, Non-toxic, Alert & Oriented x 3 HEENT: Atraumatic, Normocephalic.  Ears and Nose: No external deformity. EXTR: No clubbing/cyanosis/edema NEURO: Normal gait.  PSYCH: Normally interactive. Conversant. Not depressed or anxious appearing.  Calm demeanor.   VC obtained Sites on left arm and left leg prepped with betadine and alcohol, anesthesia with 2% lido  Draped lesions, and using aseptic technique obtained a 3 mm punch bx from both sites Sample from arm  into bottle marked Arm Sample from leg into bottle marked Leg Dressed with bandage Pt tolerated well, no immediate complications   Assessment and Plan: Skin nodule - Plan: Dermatology pathology, Dermatology pathology  Discussed wound care and gave written instructions She will let me know if any concerns or sign of infection Follow-up with results from pathology   Signed Lamar Blinks, MD

## 2016-12-17 NOTE — Progress Notes (Signed)
Pre visit review using our clinic review tool, if applicable. No additional management support is needed unless otherwise documented below in the visit note. 

## 2016-12-25 ENCOUNTER — Ambulatory Visit (INDEPENDENT_AMBULATORY_CARE_PROVIDER_SITE_OTHER): Payer: PRIVATE HEALTH INSURANCE | Admitting: Medical

## 2016-12-25 VITALS — BP 104/70 | HR 74 | Temp 97.4°F | Ht 65.0 in | Wt 173.6 lb

## 2016-12-25 DIAGNOSIS — N926 Irregular menstruation, unspecified: Secondary | ICD-10-CM

## 2016-12-25 DIAGNOSIS — H6690 Otitis media, unspecified, unspecified ear: Secondary | ICD-10-CM

## 2016-12-25 DIAGNOSIS — J301 Allergic rhinitis due to pollen: Secondary | ICD-10-CM | POA: Diagnosis not present

## 2016-12-25 DIAGNOSIS — J0100 Acute maxillary sinusitis, unspecified: Secondary | ICD-10-CM

## 2016-12-25 LAB — POCT URINE PREGNANCY: Preg Test, Ur: POSITIVE — AB

## 2016-12-25 MED ORDER — CEPHALEXIN 500 MG PO CAPS: 500.0000 mg | ORAL_CAPSULE | Freq: Two times a day (BID) | ORAL | 0 refills | Status: DC

## 2016-12-25 MED ORDER — PRENATAL MULTIVITAMIN CH: 1.0000 | ORAL_TABLET | Freq: Every day | ORAL | 11 refills | Status: DC

## 2016-12-25 NOTE — Progress Notes (Signed)
Pre visit review using our clinic review tool, if applicable. No additional management support is needed unless otherwise documented below in the visit note. 

## 2016-12-25 NOTE — Progress Notes (Signed)
Subjective:    Patient ID: [name], female    DOB: 06-05-1981, 36 y.o.   MRN: [id number]  HPI   Pt had one week of nasal congestion, nose feels clogged, and sinus pressure. She has been sneezing and some itchy eyes. Pt states no known allergy history. Pt states when blows her nose get green mucous.   Pt not coughing up any mucous.  No fever, no sweats. No diffuse body aches.  LMP- Feb states 2-3 late for menses.    Review of Systems  Constitutional: Negative for chills, fatigue and fever.  HENT: Positive for congestion, postnasal drip, sinus pain and sinus pressure. Negative for sore throat.   Respiratory: Negative for cough, chest tightness, shortness of breath and wheezing.   Cardiovascular: Negative for chest pain and palpitations.  Gastrointestinal: Negative for abdominal pain.  Musculoskeletal: Negative for back pain.  Neurological: Negative for dizziness and headaches.  Hematological: Negative for adenopathy. Does not bruise/bleed easily.  Psychiatric/Behavioral: Negative for behavioral problems and confusion. The patient is not nervous/anxious.     Past Medical History:  Diagnosis Date  . Kidney stones    surgically removed at 36YO and 36YO  . Migraines    associated with menstrual cycle  . UTI (urinary tract infection)   . Vaginal Pap smear, abnormal      Social History   Social History  . Marital status: Married    Spouse name: Tana Trefry  . Number of children: N/A  . Years of education: College   Occupational History  . sales    Social History Main Topics  . Smoking status: Former Smoker    Packs/day: 0.50    Types: Cigarettes    Quit date: 10/19/2013  . Smokeless tobacco: Never Used  . Alcohol use No  . Drug use: No  . Sexual activity: Yes    Birth control/ protection: None   Other Topics Concern  . Not on file   Social History Narrative   Lives in [place] with husband and mother and father and mother in Sports coach. Daughter 9months,  [employer].      Work - Manufacturing systems engineer in [employer]      Diet - regular   Exercise - occasional walks      Caffinated Beverages: Yes   Herbal Remedies: No   Seat Belts: Yes   Bike Helmet: Yes   Exercise 3 Times a Week: No   Vegetarian: No   Eat Dairy Products:   Take Vitamins: Yes   Use Hearing Aid: No   Wear Dentures: No   Smoke Alarms in Home: Yes   Guns/Firearms: No   Physical Abuse: No      Hours of Sleep: 6-7   # of People in Home: 3                   Past Surgical History:  Procedure Laterality Date  . CESAREAN SECTION N/A 07/21/2014   Procedure: CESAREAN SECTION;  Surgeon: Elveria Royals, MD;  Location: [facility];  Service: Obstetrics;  Laterality: N/A;  . KIDNEY STONE SURGERY    . TONSILLECTOMY AND ADENOIDECTOMY  1991    Family History  Problem Relation Age of Onset  . Hypertension Mother   . Alcohol abuse Father     Allergies  Allergen Reactions  . Sulfa Antibiotics Hives and Swelling    Current Outpatient Prescriptions on File Prior to Visit  Medication Sig Dispense Refill  . ibuprofen (ADVIL,MOTRIN) 600 MG tablet Take 1  tablet (600 mg total) by mouth every 6 (six) hours. (Patient taking differently: Take 600 mg by mouth every 6 (six) hours as needed. ) 30 tablet 0  . Nutritional Supplements (JUICE PLUS FIBRE PO) Take 1 tablet by mouth daily.    . Prenatal Vit-Fe Fumarate-FA (PRENATAL MULTIVITAMIN) TABS tablet Take 1 tablet by mouth daily at 12 noon.     No current facility-administered medications on file prior to visit.     BP 104/70   Pulse 74   Temp 97.4 F (36.3 C) (Oral)   Ht 5\' 5"  (1.651 m)   Wt 173 lb 9.6 oz (78.7 kg)   LMP 11/25/2016   SpO2 100%   BMI 28.89 kg/m       Objective:   Physical Exam  General  Mental Status - Alert. General Appearance - Well groomed. Not in acute distress.  Skin Rashes- No Rashes.  HEENT Head- Normal. Ear Auditory Canal - Left- Normal. Right - Normal.Tympanic Membrane- Left- red Right-  red Eye Sclera/Conjunctiva- Left- Normal. Right- Normal. Nose & Sinuses Nasal Mucosa- Left-  Boggy and Congested. Right-  Boggy and  Congested. Bilateral  Faint maxillary and faint  frontal sinus pressure. Mouth & Throat Lips: Upper Lip- Normal: no dryness, cracking, pallor, cyanosis, or vesicular eruption. Lower Lip-Normal: no dryness, cracking, pallor, cyanosis or vesicular eruption. Buccal Mucosa- Bilateral- No Aphthous ulcers. Oropharynx- No Discharge or Erythema. Tonsils: Characteristics- Bilateral- No Erythema or Congestion. Size/Enlargement- Bilateral- No enlargement. Discharge- bilateral-None.  Neck Neck- Supple. No Masses.   Chest and Lung Exam Auscultation: Breath Sounds:-Clear even and unlabored.  Cardiovascular Auscultation:Rythm- Regular, rate and rhythm. Murmurs & Other Heart Sounds:Ausculatation of the heart reveal- No Murmurs.  Lymphatic Head & Neck General Head & Neck Lymphatics: Bilateral: Description- No Localized lymphadenopathy.       Assessment & Plan:   You are pregnant by our test today. Will rx prenatal vitamins.  I think you do have allergies but want you to contact your ob to see what meds they approve for allergies. The med I considered was cat c.Not recommended.  Your ear do look moderate-severe  red and concern for sinus infection as well. I am writing keflex antibiotic. This is category B. You could double check with pharmacist.  Rest hydrate and tylenol for fever.  Follow up 7 days or as needed  Pt expressed that she will wait and see how she feels before starting antibiotic.Also will try to call her gyn office to see if they agree with using keflex.  Pt is very early in pregnancy. She will call her former ob and self refer. If she has delay in referral I asked her to notify me and I could refer her as well.   Ajane Novella, Percell Miller, PA-C

## 2016-12-25 NOTE — Patient Instructions (Addendum)
You are pregnant by our test today. Will rx prenatal vitamins.  I think you do have allergies but want you to contact your ob to see what meds they approve for allergies. The med I considered was cat c.Not recommended.  Your ear do look moderate-severe  red and concern for sinus infection as well. I am writing keflex antibiotic. This is category B. You could double check with pharmacist.  Rest hydrate and tylenol for fever.  Follow up 7 days or as needed  Pt expressed that she will wait and see how she feels before starting antibiotic.Also will try to call her gyn office to see if they agree with using keflex.

## 2017-01-03 ENCOUNTER — Encounter: Payer: Self-pay | Admitting: Family Medicine

## 2017-02-02 LAB — OB RESULTS CONSOLE HIV ANTIBODY (ROUTINE TESTING): HIV: NONREACTIVE

## 2017-02-02 LAB — OB RESULTS CONSOLE RUBELLA ANTIBODY, IGM: RUBELLA: IMMUNE

## 2017-02-02 LAB — OB RESULTS CONSOLE RPR: RPR: NONREACTIVE

## 2017-02-02 LAB — OB RESULTS CONSOLE HEPATITIS B SURFACE ANTIGEN: HEP B S AG: NEGATIVE

## 2017-02-02 LAB — OB RESULTS CONSOLE ABO/RH: RH Type: POSITIVE

## 2017-02-02 LAB — OB RESULTS CONSOLE GC/CHLAMYDIA
CHLAMYDIA, DNA PROBE: NEGATIVE
GC PROBE AMP, GENITAL: NEGATIVE

## 2017-02-02 LAB — OB RESULTS CONSOLE ANTIBODY SCREEN: Antibody Screen: NEGATIVE

## 2017-07-01 ENCOUNTER — Other Ambulatory Visit: Payer: Self-pay | Admitting: Obstetrics & Gynecology

## 2017-07-28 ENCOUNTER — Encounter (HOSPITAL_COMMUNITY): Payer: Self-pay | Admitting: *Deleted

## 2017-07-29 ENCOUNTER — Telehealth (HOSPITAL_COMMUNITY): Payer: Self-pay | Admitting: *Deleted

## 2017-07-29 NOTE — Telephone Encounter (Signed)
Preadmission screen  

## 2017-07-30 ENCOUNTER — Telehealth (HOSPITAL_COMMUNITY): Payer: Self-pay | Admitting: *Deleted

## 2017-07-30 NOTE — Telephone Encounter (Signed)
Preadmission screen  

## 2017-08-02 ENCOUNTER — Encounter (HOSPITAL_COMMUNITY): Payer: Self-pay

## 2017-08-09 ENCOUNTER — Encounter (HOSPITAL_COMMUNITY)
Admission: RE | Admit: 2017-08-09 | Discharge: 2017-08-09 | Disposition: A | Discharge status: Home / Self Care | Payer: 59 | Source: Ambulatory Visit | Attending: Obstetrics & Gynecology | Admitting: Obstetrics & Gynecology

## 2017-08-09 HISTORY — DX: Anemia, unspecified: D64.9

## 2017-08-09 LAB — CBC
HCT: 35.4 % — ABNORMAL LOW (ref 36.0–46.0)
HEMOGLOBIN: 12.3 g/dL (ref 12.0–15.0)
MCH: 30.9 pg (ref 26.0–34.0)
MCHC: 34.7 g/dL (ref 30.0–36.0)
MCV: 88.9 fL (ref 78.0–100.0)
Platelets: 194 10*3/uL (ref 150–400)
RBC: 3.98 MIL/uL (ref 3.87–5.11)
RDW: 14 % (ref 11.5–15.5)
WBC: 9.4 10*3/uL (ref 4.0–10.5)

## 2017-08-09 LAB — TYPE AND SCREEN
ABO/RH(D): A POS
Antibody Screen: NEGATIVE

## 2017-08-09 NOTE — H&P (Signed)
Dana Henry is a 36 y.o. female presenting for repeat C-section at 29 wks. Prior One c/section for arrest of descent and declines vaginal trial of labor.  Uncomplicated pregnancy.   OB History    Gravida Para Term Preterm AB Living   2 1 1     1    SAB TAB Ectopic Multiple Live Births           1     Past Medical History:  Diagnosis Date  . Anemia   . Kidney stones    surgically removed at 36YO and 36YO  . Migraines    associated with menstrual cycle  . UTI (urinary tract infection)   . Vaginal Pap smear, abnormal    Past Surgical History:  Procedure Laterality Date  . CESAREAN SECTION N/A 07/21/2014   Procedure: CESAREAN SECTION;  Surgeon: Elveria Royals, MD;  Location: [facility];  Service: Obstetrics;  Laterality: N/A;  . KIDNEY STONE SURGERY    . TONSILLECTOMY AND ADENOIDECTOMY  1991   Family History: family history includes Alcohol abuse in her father; Hypertension in her mother. Social History:  reports that she quit smoking about 3 years ago. Her smoking use included Cigarettes. She smoked 0.50 packs per day. She has never used smokeless tobacco. She reports that she does not drink alcohol or use drugs.     Maternal Diabetes: No Genetic Screening: Normal Panorama normal XX Maternal Ultrasounds/Referrals: Normal Fetal Ultrasounds or other Referrals:  None Maternal Substance Abuse:  No Significant Maternal Medications:  None Significant Maternal Lab Results:  Lab values include: Group B Strep negative Other Comments:  None  ROS neg History   Last menstrual period 11/25/2016, currently breastfeeding. Exam Physical Exam  A&O x 3, no acute distress. Pleasant HEENT neg, no thyromegaly Lungs CTA bilat CV RRR, S1S2 normal Abdo soft, non tender, non acute Extr no edema/ tenderness Pelvic cx closed, vx high FHT  140s Toco none  Prenatal labs: ABO, Rh: --/--/A POS (10/22 0955) Antibody: NEG (10/22 0955) Rubella: Immune (04/17 0000) RPR: Nonreactive (04/17 0000)   HBsAg: Negative (04/17 0000)  HIV: Non-reactive (04/17 0000)  GBS:   negative  Assessment/Plan: 36 yo, G2P1001, 39 wks, here for elective repeat c-section.  Risks/complications of surgery reviewed incl infection, bleeding, damage to internal organs including bladder, bowels, ureters, blood vessels, other risks from anesthesia, VTE and delayed complications of any surgery, complications in future surgery reviewed. Also discussed neonatal complications incl difficult delivery, laceration, vacuum assistance, TTN etc. Pt understands and agrees, all concerns addressed.     Lupe Bonner R

## 2017-08-09 NOTE — Patient Instructions (Signed)
Dana Henry  08/09/2017   Your procedure is scheduled on:  08/10/2017  Enter through the Main Entrance of [facility] at [place] up the phone at the desk and dial [phone]  Call this number if you have problems the morning of surgery:[phone]  Remember:   Do not eat food:After Midnight.  Do not drink clear liquids: After Midnight.  Take these medicines the morning of surgery with A SIP OF WATER: none   Do not wear jewelry, make-up or nail polish.  Do not wear lotions, powders, or perfumes. Do not wear deodorant.  Do not shave 48 hours prior to surgery.  Do not bring valuables to the hospital.  [facility] is not   responsible for any belongings or valuables brought to the hospital.  Contacts, dentures or bridgework may not be worn into surgery.  Leave suitcase in the car. After surgery it may be brought to your room.  For patients admitted to the hospital, checkout time is 11:00 AM the day of              discharge.    N/A   Please read over the following fact sheets that you were given:   Surgical Site Infection Prevention

## 2017-08-10 ENCOUNTER — Encounter (HOSPITAL_COMMUNITY): Payer: Self-pay | Admitting: Anesthesiology

## 2017-08-10 ENCOUNTER — Encounter (HOSPITAL_COMMUNITY): Payer: Self-pay | Admitting: General Practice

## 2017-08-10 ENCOUNTER — Inpatient Hospital Stay (HOSPITAL_COMMUNITY): Payer: 59

## 2017-08-10 ENCOUNTER — Encounter (HOSPITAL_COMMUNITY)
Admission: AD | Disposition: A | Discharge status: Home / Self Care | Payer: Self-pay | Source: Ambulatory Visit | Attending: Obstetrics & Gynecology

## 2017-08-10 ENCOUNTER — Inpatient Hospital Stay (HOSPITAL_COMMUNITY)
Admission: AD | Admit: 2017-08-10 | Discharge: 2017-08-12 | DRG: 788 | Disposition: A | Discharge status: Home / Self Care | Payer: 59 | Source: Ambulatory Visit | Attending: Obstetrics & Gynecology | Admitting: Obstetrics & Gynecology

## 2017-08-10 DIAGNOSIS — Z87891 Personal history of nicotine dependence: Secondary | ICD-10-CM | POA: Diagnosis not present

## 2017-08-10 DIAGNOSIS — Z3A39 39 weeks gestation of pregnancy: Secondary | ICD-10-CM | POA: Diagnosis not present

## 2017-08-10 DIAGNOSIS — O34211 Maternal care for low transverse scar from previous cesarean delivery: Principal | ICD-10-CM | POA: Diagnosis present

## 2017-08-10 DIAGNOSIS — O34219 Maternal care for unspecified type scar from previous cesarean delivery: Secondary | ICD-10-CM

## 2017-08-10 LAB — RPR: RPR: NONREACTIVE

## 2017-08-10 SURGERY — Surgical Case
Anesthesia: Spinal | Wound class: Clean Contaminated

## 2017-08-10 MED ORDER — FENTANYL CITRATE (PF) 100 MCG/2ML IJ SOLN
INTRAMUSCULAR | Status: DC | PRN
  Administered 2017-08-10: 10 ug via INTRATHECAL

## 2017-08-10 MED ORDER — SODIUM CHLORIDE 0.9% FLUSH: 3.0000 mL | INTRAVENOUS | Status: DC | PRN

## 2017-08-10 MED ORDER — SENNOSIDES-DOCUSATE SODIUM 8.6-50 MG PO TABS
2.0000 | ORAL_TABLET | ORAL | Status: DC
  Administered 2017-08-10 – 2017-08-11 (×2): 2 via ORAL
  Filled 2017-08-10 (×2): qty 2

## 2017-08-10 MED ORDER — DEXAMETHASONE SODIUM PHOSPHATE 4 MG/ML IJ SOLN
INTRAMUSCULAR | Status: DC | PRN
  Administered 2017-08-10: 4 mg via INTRAVENOUS

## 2017-08-10 MED ORDER — PROMETHAZINE HCL 25 MG/ML IJ SOLN: 6.2500 mg | INTRAMUSCULAR | Status: DC | PRN

## 2017-08-10 MED ORDER — SIMETHICONE 80 MG PO CHEW
80.0000 mg | CHEWABLE_TABLET | ORAL | Status: DC
  Administered 2017-08-11: 80 mg via ORAL
  Filled 2017-08-10 (×2): qty 1

## 2017-08-10 MED ORDER — ONDANSETRON HCL 4 MG/2ML IJ SOLN
INTRAMUSCULAR | Status: DC | PRN
  Administered 2017-08-10: 4 mg via INTRAVENOUS

## 2017-08-10 MED ORDER — PRENATAL MULTIVITAMIN CH
1.0000 | ORAL_TABLET | Freq: Every day | ORAL | Status: DC
  Administered 2017-08-11 – 2017-08-12 (×2): 1 via ORAL
  Filled 2017-08-10 (×2): qty 1

## 2017-08-10 MED ORDER — TETANUS-DIPHTH-ACELL PERTUSSIS 5-2.5-18.5 LF-MCG/0.5 IM SUSP: 0.5000 mL | Freq: Once | INTRAMUSCULAR | Status: DC

## 2017-08-10 MED ORDER — NALBUPHINE HCL 10 MG/ML IJ SOLN: 5.0000 mg | Freq: Once | INTRAMUSCULAR | Status: DC | PRN

## 2017-08-10 MED ORDER — MORPHINE SULFATE (PF) 0.5 MG/ML IJ SOLN
INTRAMUSCULAR | Status: DC | PRN
  Administered 2017-08-10: 200 ug via INTRATHECAL

## 2017-08-10 MED ORDER — IBUPROFEN 600 MG PO TABS
600.0000 mg | ORAL_TABLET | Freq: Four times a day (QID) | ORAL | Status: DC
  Administered 2017-08-10 – 2017-08-12 (×8): 600 mg via ORAL
  Filled 2017-08-10 (×8): qty 1

## 2017-08-10 MED ORDER — MENTHOL 3 MG MT LOZG: 1.0000 | LOZENGE | OROMUCOSAL | Status: DC | PRN

## 2017-08-10 MED ORDER — SOD CITRATE-CITRIC ACID 500-334 MG/5ML PO SOLN
30.0000 mL | Freq: Once | ORAL | Status: AC
  Administered 2017-08-10: 30 mL via ORAL
  Filled 2017-08-10: qty 15

## 2017-08-10 MED ORDER — FENTANYL CITRATE (PF) 100 MCG/2ML IJ SOLN: 25.0000 ug | INTRAMUSCULAR | Status: DC | PRN

## 2017-08-10 MED ORDER — DIPHENHYDRAMINE HCL 25 MG PO CAPS: 25.0000 mg | ORAL_CAPSULE | Freq: Four times a day (QID) | ORAL | Status: DC | PRN

## 2017-08-10 MED ORDER — ACETAMINOPHEN 500 MG PO TABS: 1000.0000 mg | ORAL_TABLET | Freq: Four times a day (QID) | ORAL | Status: DC

## 2017-08-10 MED ORDER — SIMETHICONE 80 MG PO CHEW: 80.0000 mg | CHEWABLE_TABLET | ORAL | Status: DC | PRN

## 2017-08-10 MED ORDER — ZOLPIDEM TARTRATE 5 MG PO TABS
5.0000 mg | ORAL_TABLET | Freq: Every evening | ORAL | Status: DC | PRN
Start: 2017-08-10 — End: 2017-08-11

## 2017-08-10 MED ORDER — DIPHENHYDRAMINE HCL 25 MG PO CAPS
25.0000 mg | ORAL_CAPSULE | ORAL | Status: DC | PRN
  Filled 2017-08-10: qty 1

## 2017-08-10 MED ORDER — FENTANYL CITRATE (PF) 100 MCG/2ML IJ SOLN
INTRAMUSCULAR | Status: AC
  Filled 2017-08-10: qty 2

## 2017-08-10 MED ORDER — OXYTOCIN 10 UNIT/ML IJ SOLN
INTRAMUSCULAR | Status: DC | PRN
  Administered 2017-08-10: 40 [IU] via INTRAVENOUS

## 2017-08-10 MED ORDER — NALBUPHINE HCL 10 MG/ML IJ SOLN: 5.0000 mg | INTRAMUSCULAR | Status: DC | PRN

## 2017-08-10 MED ORDER — CEFAZOLIN SODIUM-DEXTROSE 2-4 GM/100ML-% IV SOLN
2.0000 g | INTRAVENOUS | Status: AC
  Administered 2017-08-10: 2 g via INTRAVENOUS

## 2017-08-10 MED ORDER — ONDANSETRON HCL 4 MG/2ML IJ SOLN: 4.0000 mg | Freq: Three times a day (TID) | INTRAMUSCULAR | Status: DC | PRN

## 2017-08-10 MED ORDER — SCOPOLAMINE 1 MG/3DAYS TD PT72: 1.0000 | MEDICATED_PATCH | Freq: Once | TRANSDERMAL | Status: DC

## 2017-08-10 MED ORDER — COCONUT OIL OIL: 1.0000 "application " | TOPICAL_OIL | Status: DC | PRN

## 2017-08-10 MED ORDER — NALOXONE HCL 0.4 MG/ML IJ SOLN: 0.4000 mg | INTRAMUSCULAR | Status: DC | PRN

## 2017-08-10 MED ORDER — SCOPOLAMINE 1 MG/3DAYS TD PT72
1.0000 | MEDICATED_PATCH | TRANSDERMAL | Status: DC
  Administered 2017-08-10: 1.5 mg via TRANSDERMAL
  Filled 2017-08-10: qty 1

## 2017-08-10 MED ORDER — KETOROLAC TROMETHAMINE 30 MG/ML IJ SOLN
INTRAMUSCULAR | Status: AC
  Filled 2017-08-10: qty 1

## 2017-08-10 MED ORDER — SIMETHICONE 80 MG PO CHEW
80.0000 mg | CHEWABLE_TABLET | Freq: Three times a day (TID) | ORAL | Status: DC
  Administered 2017-08-10 – 2017-08-12 (×6): 80 mg via ORAL
  Filled 2017-08-10 (×6): qty 1

## 2017-08-10 MED ORDER — BUPIVACAINE IN DEXTROSE 0.75-8.25 % IT SOLN
INTRATHECAL | Status: AC
  Filled 2017-08-10: qty 2

## 2017-08-10 MED ORDER — WITCH HAZEL-GLYCERIN EX PADS: 1.0000 "application " | MEDICATED_PAD | CUTANEOUS | Status: DC | PRN

## 2017-08-10 MED ORDER — NALOXONE HCL 2 MG/2ML IJ SOSY
1.0000 ug/kg/h | PREFILLED_SYRINGE | INTRAVENOUS | Status: DC | PRN
  Filled 2017-08-10: qty 2

## 2017-08-10 MED ORDER — OXYTOCIN 40 UNITS IN LACTATED RINGERS INFUSION - SIMPLE MED: 2.5000 [IU]/h | INTRAVENOUS | Status: DC

## 2017-08-10 MED ORDER — DIPHENHYDRAMINE HCL 50 MG/ML IJ SOLN: 12.5000 mg | INTRAMUSCULAR | Status: DC | PRN

## 2017-08-10 MED ORDER — DEXAMETHASONE SODIUM PHOSPHATE 4 MG/ML IJ SOLN
INTRAMUSCULAR | Status: AC
  Filled 2017-08-10: qty 1

## 2017-08-10 MED ORDER — MEPERIDINE HCL 25 MG/ML IJ SOLN: 6.2500 mg | INTRAMUSCULAR | Status: DC | PRN

## 2017-08-10 MED ORDER — ONDANSETRON HCL 4 MG/2ML IJ SOLN
INTRAMUSCULAR | Status: AC
  Filled 2017-08-10: qty 2

## 2017-08-10 MED ORDER — LACTATED RINGERS IV SOLN
INTRAVENOUS | Status: DC
  Administered 2017-08-10 (×2): via INTRAVENOUS

## 2017-08-10 MED ORDER — KETOROLAC TROMETHAMINE 30 MG/ML IJ SOLN
30.0000 mg | Freq: Four times a day (QID) | INTRAMUSCULAR | Status: DC | PRN
  Administered 2017-08-10: 30 mg via INTRAMUSCULAR

## 2017-08-10 MED ORDER — LACTATED RINGERS IV SOLN
INTRAVENOUS | Status: DC
  Administered 2017-08-10 (×4): via INTRAVENOUS

## 2017-08-10 MED ORDER — KETOROLAC TROMETHAMINE 30 MG/ML IJ SOLN: 30.0000 mg | Freq: Four times a day (QID) | INTRAMUSCULAR | Status: DC | PRN

## 2017-08-10 MED ORDER — MORPHINE SULFATE (PF) 0.5 MG/ML IJ SOLN
INTRAMUSCULAR | Status: AC
  Filled 2017-08-10: qty 10

## 2017-08-10 MED ORDER — NALBUPHINE HCL 10 MG/ML IJ SOLN
3.0000 mg | INTRAMUSCULAR | Status: DC | PRN
  Administered 2017-08-10 (×2): 3 mg via INTRAVENOUS
  Filled 2017-08-10 (×2): qty 1

## 2017-08-10 MED ORDER — LACTATED RINGERS IV SOLN
INTRAVENOUS | Status: DC | PRN
  Administered 2017-08-10: 10:00:00 via INTRAVENOUS

## 2017-08-10 MED ORDER — DIBUCAINE 1 % RE OINT: 1.0000 "application " | TOPICAL_OINTMENT | RECTAL | Status: DC | PRN

## 2017-08-10 MED ORDER — ACETAMINOPHEN 325 MG PO TABS
650.0000 mg | ORAL_TABLET | ORAL | Status: DC | PRN
  Administered 2017-08-11: 650 mg via ORAL
  Filled 2017-08-10: qty 2

## 2017-08-10 MED ORDER — BUPIVACAINE IN DEXTROSE 0.75-8.25 % IT SOLN
INTRATHECAL | Status: DC | PRN
  Administered 2017-08-10: 1.6 mL via INTRATHECAL

## 2017-08-10 MED ORDER — OXYCODONE HCL 5 MG PO TABS
5.0000 mg | ORAL_TABLET | ORAL | Status: DC | PRN
  Administered 2017-08-11: 5 mg via ORAL
  Filled 2017-08-10: qty 1

## 2017-08-10 MED ORDER — PHENYLEPHRINE 8 MG IN D5W 100 ML (0.08MG/ML) PREMIX OPTIME
INJECTION | INTRAVENOUS | Status: DC | PRN
  Administered 2017-08-10: 60 ug/min via INTRAVENOUS

## 2017-08-10 MED ORDER — OXYTOCIN 10 UNIT/ML IJ SOLN
INTRAMUSCULAR | Status: AC
  Filled 2017-08-10: qty 4

## 2017-08-10 SURGICAL SUPPLY — 36 items
BENZOIN TINCTURE PRP APPL 2/3 (GAUZE/BANDAGES/DRESSINGS) ×3 IMPLANT
CHLORAPREP W/TINT 26ML (MISCELLANEOUS) ×3 IMPLANT
CLAMP CORD UMBIL (MISCELLANEOUS) IMPLANT
CLOSURE WOUND 1/2 X4 (GAUZE/BANDAGES/DRESSINGS) ×1
CLOTH BEACON ORANGE TIMEOUT ST (SAFETY) ×3 IMPLANT
CONTAINER PREFILL 10% NBF 15ML (MISCELLANEOUS) IMPLANT
DRSG OPSITE POSTOP 4X10 (GAUZE/BANDAGES/DRESSINGS) ×3 IMPLANT
ELECT REM PT RETURN 9FT ADLT (ELECTROSURGICAL) ×3
ELECTRODE REM PT RTRN 9FT ADLT (ELECTROSURGICAL) ×1 IMPLANT
EXTRACTOR VACUUM KIWI (MISCELLANEOUS) ×3 IMPLANT
EXTRACTOR VACUUM M CUP 4 TUBE (SUCTIONS) IMPLANT
EXTRACTOR VACUUM M CUP 4' TUBE (SUCTIONS)
GLOVE BIO SURGEON STRL SZ7 (GLOVE) ×3 IMPLANT
GLOVE BIOGEL PI IND STRL 7.0 (GLOVE) ×2 IMPLANT
GLOVE BIOGEL PI INDICATOR 7.0 (GLOVE) ×4
GOWN STRL REUS W/TWL LRG LVL3 (GOWN DISPOSABLE) ×6 IMPLANT
KIT ABG SYR 3ML LUER SLIP (SYRINGE) IMPLANT
NEEDLE HYPO 25X5/8 SAFETYGLIDE (NEEDLE) IMPLANT
NS IRRIG 1000ML POUR BTL (IV SOLUTION) ×3 IMPLANT
PACK C SECTION WH (CUSTOM PROCEDURE TRAY) ×3 IMPLANT
PAD OB MATERNITY 4.3X12.25 (PERSONAL CARE ITEMS) ×3 IMPLANT
RTRCTR C-SECT PINK 25CM LRG (MISCELLANEOUS) IMPLANT
STRIP CLOSURE SKIN 1/2X4 (GAUZE/BANDAGES/DRESSINGS) ×2 IMPLANT
SUT MON AB-0 CT1 36 (SUTURE) ×6 IMPLANT
SUT PLAIN 0 NONE (SUTURE) IMPLANT
SUT PLAIN 2 0 (SUTURE)
SUT PLAIN ABS 2-0 CT1 27XMFL (SUTURE) IMPLANT
SUT PROLENE 1 CT (SUTURE) ×3 IMPLANT
SUT VIC AB 0 CT1 27 (SUTURE) ×4
SUT VIC AB 0 CT1 27XBRD ANBCTR (SUTURE) ×2 IMPLANT
SUT VIC AB 2-0 CT1 27 (SUTURE) ×2
SUT VIC AB 2-0 CT1 TAPERPNT 27 (SUTURE) ×1 IMPLANT
SUT VIC AB 4-0 KS 27 (SUTURE) ×3 IMPLANT
SUT VICRYL 0 TIES 12 18 (SUTURE) IMPLANT
TOWEL OR 17X24 6PK STRL BLUE (TOWEL DISPOSABLE) ×3 IMPLANT
TRAY FOLEY BAG SILVER LF 14FR (SET/KITS/TRAYS/PACK) IMPLANT

## 2017-08-10 NOTE — Anesthesia Postprocedure Evaluation (Signed)
Anesthesia Post Note  Patient: Dana Henry  Procedure(s) Performed: Repeat CESAREAN SECTION (N/A )     Patient location during evaluation: Mother Baby Anesthesia Type: Spinal Level of consciousness: awake and alert and oriented Pain management: satisfactory to patient Vital Signs Assessment: post-procedure vital signs reviewed and stable Respiratory status: spontaneous breathing and nonlabored ventilation Cardiovascular status: stable Postop Assessment: no headache, no backache, patient able to bend at knees, no signs of nausea or vomiting and adequate PO intake Anesthetic complications: no    Last Vitals:  Vitals:   08/10/17 1315 08/10/17 1415  BP: (!) 106/57 (!) 104/52  Pulse: 69 68  Resp: 16 18  Temp: 36.6 C 36.8 C  SpO2: 98% 97%    Last Pain:  Vitals:   08/10/17 1415  TempSrc: Oral  PainSc:    Pain Goal: Patients Stated Pain Goal: 3 (08/10/17 1215)               Willa Rough

## 2017-08-10 NOTE — Anesthesia Preprocedure Evaluation (Addendum)
Anesthesia Evaluation  Patient identified by MRN, date of birth, ID band Patient awake    Reviewed: Allergy & Precautions, H&P , NPO status , Patient's Chart, lab work & pertinent test results  History of Anesthesia Complications Negative for: history of anesthetic complications  Airway Mallampati: III  TM Distance: >3 FB Neck ROM: full    Dental  (+) Teeth Intact, Dental Advisory Given   Pulmonary former smoker,    breath sounds clear to auscultation       Cardiovascular negative cardio ROS   Rhythm:regular Rate:Normal     Neuro/Psych  Headaches (migraines), negative psych ROS   GI/Hepatic negative GI ROS, Neg liver ROS,   Endo/Other  BMI 34.4  Renal/GU Renal disease: kidney stones.Nephrolithiasis  negative genitourinary   Musculoskeletal   Abdominal   Peds  Hematology negative hematology ROS (+)   Anesthesia Other Findings   Reproductive/Obstetrics (+) Pregnancy                            Anesthesia Physical  Anesthesia Plan  ASA: II  Anesthesia Plan: Spinal   Post-op Pain Management:    Induction:   PONV Risk Score and Plan: Treatment may vary due to age or medical condition  Airway Management Planned: Natural Airway and Nasal Cannula  Additional Equipment: None  Intra-op Plan:   Post-operative Plan:   Informed Consent: I have reviewed the patients History and Physical, chart, labs and discussed the procedure including the risks, benefits and alternatives for the proposed anesthesia with the patient or authorized representative who has indicated his/her understanding and acceptance.   Dental advisory given  Plan Discussed with: CRNA  Anesthesia Plan Comments:         Anesthesia Quick Evaluation

## 2017-08-10 NOTE — Anesthesia Postprocedure Evaluation (Signed)
Anesthesia Post Note  Patient: Dana Henry  Procedure(s) Performed: Repeat CESAREAN SECTION (N/A )     Patient location during evaluation: PACU Anesthesia Type: Spinal Level of consciousness: awake and alert Pain management: pain level controlled Vital Signs Assessment: post-procedure vital signs reviewed and stable Respiratory status: spontaneous breathing and respiratory function stable Cardiovascular status: blood pressure returned to baseline and stable Postop Assessment: spinal receding and no apparent nausea or vomiting Anesthetic complications: no    Last Vitals:  Vitals:   08/10/17 1154 08/10/17 1200  BP:  (!) 98/46  Pulse:  61  Resp: 11 16  Temp:    SpO2:  100%    Last Pain:  Vitals:   08/10/17 1135  TempSrc:   PainSc: 0-No pain   Pain Goal:                 Audry Pili

## 2017-08-10 NOTE — Addendum Note (Signed)
Addendum  created 08/10/17 1437 by Jonna Munro, CRNA   Sign clinical note

## 2017-08-10 NOTE — Anesthesia Procedure Notes (Signed)
Spinal  Patient location during procedure: OB Start time: 08/10/2017 9:39 AM End time: 08/10/2017 9:46 AM Staffing Anesthesiologist: Renold Don E Performed: anesthesiologist  Preanesthetic Checklist Completed: patient identified, surgical consent, pre-op evaluation, timeout performed, IV checked, risks and benefits discussed and monitors and equipment checked Spinal Block Patient position: sitting Prep: site prepped and draped and DuraPrep Patient monitoring: heart rate, cardiac monitor, continuous pulse ox and blood pressure Approach: midline Location: L3-4 Injection technique: single-shot Needle Needle type: Pencan  Needle gauge: 24 G Needle length: 10 cm Assessment Sensory level: T4

## 2017-08-10 NOTE — Transfer of Care (Signed)
Immediate Anesthesia Transfer of Care Note  Patient: Dana Henry  Procedure(s) Performed: Repeat CESAREAN SECTION (N/A )  Patient Location: PACU  Anesthesia Type:Spinal  Level of Consciousness: awake, alert  and oriented  Airway & Oxygen Therapy: Patient Spontanous Breathing  Post-op Assessment: Report given to RN and Post -op Vital signs reviewed and stable  Post vital signs: Reviewed and stable  Last Vitals:  Vitals:   08/10/17 0813  BP: 124/80  Pulse: 99  Resp: 18  Temp: 37.1 C    Last Pain:  Vitals:   08/10/17 0813  TempSrc: Oral  PainSc:          Complications: No apparent anesthesia complications

## 2017-08-10 NOTE — Op Note (Signed)
08/10/2017 Cesarean Section Procedure Note  Dana Henry  Indications: Scheduled Proceedure/Maternal Request 39 weeks   Pre-operative Diagnosis: Previous Cesarean Section.   Post-operative Diagnosis: Same   Surgeon: Azucena Fallen, MD  Assistants: Lars Pinks, CNM   Anesthesia: spinal   Procedure Details:  The patient was seen in the Holding Room. The risks, benefits, complications, treatment options, and expected outcomes were discussed with the patient. The patient concurred with the proposed plan, giving informed consent. identified as Dana Henry and the procedure verified as C-Section Delivery. A Time Out was held and the above information confirmed. 2 gm Ancef started.   After induction of anesthesia, the patient was draped and prepped in the usual sterile manner and foley catheter was placed. A Pfannenstiel Incision was made and carried down through the subcutaneous tissue to the fascia. Fascial incision was made and extended transversely. The fascia was separated from the underlying rectus tissue superiorly and inferiorly. The peritoneum was identified and entered. Peritoneal incision was extended longitudinally. Alexis retractor placed. The utero-vesical peritoneal reflection was incised transversely and the bladder flap was bluntly freed from the lower uterine segment. A low transverse uterine incision was made. Clear amniotic fluid drained. Head was brought to the incision but due to prior scar head was not able to deliver out with fundal pressure. So Kiwi vacuum application done and with one gentle traction head was delivered. Loose nuchal cord released and rest of the baby delivered without difficulty at 10.15 am. Delayed cord clamping done at 1 minute. Apgar scores of 8 at one minute and 9 at five minutes.  Cord ph was not sent. Umbilical cord blood was obtained for evaluation. The placenta was removed Intact and appeared normal. The uterine outline, tubes and ovaries  appeared normal. The uterine incision was closed with running locked sutures of 0-Monocryl followed by second imbricating layer. Hemostasis was observed. Alexis retractor removed. Peritoneal closure with 2-0 Vicryl. The fascia was then reapproximated with running sutures of 0Vicryl. The subcuticular closure was performed using 2-0plain gut. The skin was closed with 4-0Vicryl. Steristrips, Honeycomb dressing applied.   Instrument, sponge, and needle counts were correct prior the abdominal closure and were correct at the conclusion of the case.   Findings: Female infant delivered from [facility] hysterotomy at 10.15 am with one Kiwi vacuum gentle traction as head was not able to be delivered through abdominal incision by just fundal pressure. Apgars 8 and 9. Infant vigorous, delayed cord clamping at 1 minute. Uterus, tubes, ovaries normal.    Estimated Blood Loss: 392 mL   Total IV Fluids: 3200 ml LR   Urine Output: 300CC OF clear urine  Specimens: Cord blood    Complications: no complications  Disposition: PACU - hemodynamically stable.   Maternal Condition: stable   Baby condition / location:  Couplet care / Skin to Skin  Attending Attestation: I performed the procedure.   Signed: Surgeon(s): Azucena Fallen, MD

## 2017-08-11 ENCOUNTER — Encounter (HOSPITAL_COMMUNITY): Payer: Self-pay | Admitting: Obstetrics & Gynecology

## 2017-08-11 LAB — CBC
HCT: 29.1 % — ABNORMAL LOW (ref 36.0–46.0)
HEMOGLOBIN: 10.3 g/dL — AB (ref 12.0–15.0)
MCH: 32.2 pg (ref 26.0–34.0)
MCHC: 35.4 g/dL (ref 30.0–36.0)
MCV: 90.9 fL (ref 78.0–100.0)
PLATELETS: 174 10*3/uL (ref 150–400)
RBC: 3.2 MIL/uL — ABNORMAL LOW (ref 3.87–5.11)
RDW: 14.2 % (ref 11.5–15.5)
WBC: 12 10*3/uL — AB (ref 4.0–10.5)

## 2017-08-11 LAB — BIRTH TISSUE RECOVERY COLLECTION (PLACENTA DONATION)

## 2017-08-11 NOTE — Progress Notes (Signed)
POSTOPERATIVE DAY # 1 S/P repeat CS  S:         Reports feeling ok - sore             Tolerating po intake / no nausea / no vomiting / no flatus / no BM             Bleeding is light             Pain controlled with motrin             Up ad lib / ambulatory/ voiding QS  Newborn breast feeding   O:  VS: BP (!) 100/59 (BP Location: Right Arm)   Pulse 78   Temp 98.2 F (36.8 C) (Oral)   Resp 18   Ht 5\' 5"  (1.651 m)   Wt 91.6 kg (202 lb)   LMP 11/25/2016   SpO2 96%   Breastfeeding? Unknown   BMI 33.61 kg/m    LABS:               Recent Labs  08/09/17 0955 08/11/17 0550  WBC 9.4 12.0*  HGB 12.3 10.3*  PLT 194 174               Bloodtype: --/--/A POS (10/22 0955)  Rubella: Immune (04/17 0000)              tdap 2018             Flu vaccine - 07/2017                                          I&O: Intake/Output      10/23 0701 - 10/24 0700 10/24 0701 - 10/25 0700   P.O. 480    I.V. (mL/kg) 3387.5 (37)    Total Intake(mL/kg) 3867.5 (42.2)    Urine (mL/kg/hr) 3725    Blood 422    Total Output 4147     Net -279.5                     Physical Exam:             Alert and Oriented X3  Lungs: Clear and unlabored  Heart: regular rate and rhythm / no mumurs  Abdomen: soft, non-tender, non-distended, hypoactive BS             Fundus: firm, non-tender, Ueven             Dressing intact              Incision:  approximated with suture / no erythema / no ecchymosis / no drainage  Perineum: intact  Lochia: light  Extremities: traceedema, no calf pain or tenderness, SCD in place  A:        POD # 1 S/P CS - elective repeat             P:        Routine postoperative care                 Artelia Laroche CNM, MSN, [facility] 08/11/2017, 8:04 AM

## 2017-08-11 NOTE — Lactation Note (Signed)
This note was copied from a baby's chart. Lactation Consultation Note Baby 45 hrs old has BF well several times per mom. Has had emesis after delivery. Mom has 37 yr old that she BF for 20 months no difficulty. Mom had a pacifier putting in baby's mouth d/t baby fussy. Mom stated baby just fed about 10 min. Ago for about 10 min. Discussed newborn feeding habits and behaviors. Discussed feeding cues, if pacifier in mouth, covers up feeding cues. Discouraged for 2 weeks. Mom stated ok. Mom has "V" shaped breast w/nipple at bottom end of breast. Assisted baby in football hold. Baby latched aggressively.BF for 5 more min. Then fell asleep. Pierce swaddled baby. Rest soundly.  Mom denied painful latching. Mom encouraged to feed baby 8-12 times/24 hours and with feeding cues. Discussed STS, I&O, and cluster feeding. Encouraged mom to call for questions or concerns.  [place] brochure given w/resources, support groups and [place] services.  Patient Name: Dana Henry [id number] Date: 08/11/2017 Reason for consult: Initial assessment   Maternal Data Has patient been taught Hand Expression?: Yes Does the patient have breastfeeding experience prior to this delivery?: Yes  Feeding Feeding Type: Breast Fed Length of feed: 15 min  LATCH Score Latch: Repeated attempts needed to sustain latch, nipple held in mouth throughout feeding, stimulation needed to elicit sucking reflex.  Audible Swallowing: Spontaneous and intermittent  Type of Nipple: Everted at rest and after stimulation (semi flat very short shaft)  Comfort (Breast/Nipple): Soft / non-tender  Hold (Positioning): Assistance needed to correctly position infant at breast and maintain latch.  LATCH Score: 8  Interventions Interventions: Breast feeding basics reviewed;Breast compression;Support pillows;Assisted with latch;Adjust position;Breast massage;Position options;Hand express  Lactation Tools Discussed/Used     Consult Status Consult  Status: Follow-up Date: 08/12/17 Follow-up type: In-patient    George Alcantar, Elta [place] 08/11/2017, 2:18 AM

## 2017-08-12 MED ORDER — OXYCODONE HCL 5 MG PO TABS: 5.0000 mg | ORAL_TABLET | ORAL | 0 refills | Status: DC | PRN

## 2017-08-12 MED ORDER — IBUPROFEN 600 MG PO TABS: 600.0000 mg | ORAL_TABLET | Freq: Four times a day (QID) | ORAL | 0 refills | Status: DC

## 2017-08-12 NOTE — Lactation Note (Signed)
This note was copied from a baby's chart. Lactation Consultation Note  Patient Name: Dana Henry [id number] Date: 08/12/2017    Mom's breasts are full & her infant is nursing very well. "Dana Henry" has a suck: swallow ratio of 1:1. LS=10. Mother pleased.  RN updated.   Matthias Hughs [facility] 08/12/2017, 12:20 PM

## 2017-08-12 NOTE — Progress Notes (Signed)
POSTOPERATIVE DAY # 2 S/P CS   S:         Reports feeling ok - wants to go home             Tolerating po intake / no nausea / no vomiting / + flatus / no BM             Bleeding is light             Pain controlled with motrin             Up ad lib / ambulatory/ voiding QS  Newborn breast feeding    O:  VS: BP (!) 129/58 (BP Location: Right Arm)   Pulse 72   Temp 98.2 F (36.8 C) (Oral)   Resp 18   Ht 5\' 5"  (1.651 m)   Wt 91.6 kg (202 lb)   LMP 11/25/2016   SpO2 98%   Breastfeeding? Unknown   BMI 33.61 kg/m                Physical Exam:             Alert and Oriented X3  Lungs: Clear and unlabored  Heart: regular rate and rhythm / no mumurs  Abdomen: soft, non-tender, non-distended, active BS             Fundus: firm, non-tender, U-1             Dressing intact              Incision:  approximated with suture / no erythema / no ecchymosis / no drainage  Perineum: intact  Lochia: light  Extremities: 1+ edema, no calf pain or tenderness, negative Homans  A:        POD # 2 S/P CS             P:        Routine postoperative care              DC home today - WOB booklet - instructions reviewed     Artelia Laroche CNM, MSN, [facility] 08/12/2017, 8:36 AM

## 2017-08-12 NOTE — Discharge Summary (Signed)
OB Discharge Summary  Patient Name: Dana Henry DOB: 04/23/1981 MRN: [id number]  Date of admission: 08/10/2017  Admitting diagnosis: Previous Cesarean Section Intrauterine pregnancy: [redacted]w[redacted]d      Date of discharge: 08/12/2017    Discharge diagnosis: Term Pregnancy Delivered / POD 2 s/p repeat c-section   Prenatal history: G2P2002   EDC : 08/17/2017, by Other Basis  Prenatal care at [facility]  Primary provider : Mody Prenatal course complicated by previous C-section  Prenatal Labs: ABO, Rh: --/--/A POS (10/22 0955)  Antibody: NEG (10/22 0955) Rubella: Immune (04/17 0000)  RPR: Non Reactive (10/22 0955)  HBsAg: Negative (04/17 0000)  HIV: Non-reactive (04/17 0000)                                  Hospital course:  Sceduled C/S   36 y.o. yo G2P2002 at [redacted]w[redacted]d was admitted to the hospital 08/10/2017 for scheduled cesarean section with the following indication:Elective Repeat.  Membrane Rupture Time/Date: 10:13 AM ,08/10/2017   Patient delivered a Viable infant.08/10/2017   Details of operation can be found in separate operative note.  Pateint had an uncomplicated postpartum course.  She is ambulating, tolerating a regular diet, passing flatus, and urinating well. Patient is discharged home in stable condition on  08/12/17    Delivering PROVIDER: MODY, VAISHALI                                                            Complications: None  Newborn Data: Live born female  Birth Weight: 7 lb 6 oz (3345 g) APGAR: 8, 9  Newborn Delivery   Birth date/time:  08/10/2017 10:15:00 Delivery type:  C-Section, Vacuum Assisted  C-section categorization:  Repeat     Baby Feeding: Breast Disposition:home with mother  Post partum procedures:none  Labs: Lab Results  Component Value Date   WBC 12.0 (H) 08/11/2017   HGB 10.3 (L) 08/11/2017   HCT 29.1 (L) 08/11/2017   MCV 90.9 08/11/2017   PLT 174 08/11/2017   CMP Latest Ref Rng & Units 10/21/2016   Glucose 70 - 99 mg/dL 72  BUN 6 - 23 mg/dL 11  Creatinine 0.40 - 1.20 mg/dL 0.83  Sodium 135 - 145 mEq/L 141  Potassium 3.5 - 5.1 mEq/L 3.9  Chloride 96 - 112 mEq/L 108  CO2 19 - 32 mEq/L 28  Calcium 8.4 - 10.5 mg/dL 9.4  Total Protein 6.0 - 8.3 g/dL 7.3  Total Bilirubin 0.2 - 1.2 mg/dL 0.3  Alkaline Phos 39 - 117 U/L 31(L)  AST 0 - 37 U/L 16  ALT 0 - 35 U/L 20      Physical Exam @ time of discharge:  Vitals:   08/11/17 0400 08/11/17 0930 08/11/17 1813 08/12/17 0519  BP: (!) 100/59 (!) 103/56 (!) 105/42 (!) 129/58  Pulse: 78 84 77 72  Resp: 18 16 18 18   Temp: 98.2 F (36.8 C) 97.8 F (36.6 C) 97.7 F (36.5 C) 98.2 F (36.8 C)  TempSrc: Oral Oral Oral Oral  SpO2:    98%  Weight:      Height:        General: alert, cooperative and no distress Lochia: appropriate Uterine Fundus: firm  Perineum: intact Incision: Healing well with no significant drainage Extremities: DVT Evaluation: No evidence of DVT seen on physical exam.   Discharge instructions:  "Baby and Me Booklet" and [place] Booklet  Discharge Medications:  Allergies as of 08/12/2017      Reactions   Sulfa Antibiotics Hives, Swelling      Medication List    TAKE these medications   acetaminophen 500 MG tablet Commonly known as:  TYLENOL Take 1,000 mg by mouth daily as needed for moderate pain or headache.   ibuprofen 600 MG tablet Commonly known as:  ADVIL,MOTRIN Take 1 tablet (600 mg total) by mouth every 6 (six) hours.   JUICE PLUS FIBRE PO Take 2 tablets by mouth daily.   oxyCODONE 5 MG immediate release tablet Commonly known as:  Oxy IR/ROXICODONE Take 1 tablet (5 mg total) by mouth every 4 (four) hours as needed (pain scale 4-7).   prenatal multivitamin Tabs tablet Take 1 tablet by mouth daily at 12 noon. What changed:  when to take this            Discharge Care Instructions        Start     Ordered   08/12/17 0000  Discharge wound care:    Comments:  Keep incision clean  and dry / remove plastic dressing Saturday Tape strips remain in place x 2 weeks - dry after shower with hairdryer   08/12/17 1057      Diet: routine diet  Activity: Advance as tolerated. Pelvic rest x 6 weeks.   Follow up:6 weeks    Signed: Artelia Laroche CNM, MSN, [facility] 08/12/2017, 10:58 AM

## 2017-08-12 NOTE — Lactation Note (Signed)
This note was copied from a baby's chart. Lactation Consultation Note  Patient Name: Dana Henry [id number] Date: 08/12/2017  Infant is at 9% weight loss & has not had a void in greater than 24 hours. Last BM was only slightly more than a smear. Mom reported that she had to supplement with her 1st baby. Mom is open to supplementing, if needed. Mom has my # to call for assist w/next feeding.  Mom is unsure of how often infant swallows at breast, but mentions possibly noticing only 4-5 swallows/feeding.   Matthias Hughs [facility] 08/12/2017, 11:22 AM

## 2017-08-18 ENCOUNTER — Encounter (HOSPITAL_COMMUNITY): Payer: Self-pay | Admitting: Obstetrics & Gynecology

## 2017-08-18 NOTE — Addendum Note (Signed)
Addendum  created 08/18/17 0917 by Audry Pili, MD   Anesthesia Event edited, Anesthesia Staff edited

## 2018-03-10 ENCOUNTER — Encounter: Payer: Self-pay | Admitting: Family Medicine

## 2018-03-11 ENCOUNTER — Ambulatory Visit (INDEPENDENT_AMBULATORY_CARE_PROVIDER_SITE_OTHER): Payer: BLUE CROSS/BLUE SHIELD | Admitting: Internal Medicine

## 2018-03-11 ENCOUNTER — Encounter: Payer: Self-pay | Admitting: Internal Medicine

## 2018-03-11 VITALS — BP 118/68 | HR 72 | Temp 97.4°F | Resp 14 | Ht 65.0 in | Wt 179.5 lb

## 2018-03-11 DIAGNOSIS — J069 Acute upper respiratory infection, unspecified: Secondary | ICD-10-CM

## 2018-03-11 MED ORDER — BUDESONIDE 32 MCG/ACT NA SUSP: 2.0000 | Freq: Every day | NASAL | 2 refills | Status: DC

## 2018-03-11 NOTE — Progress Notes (Signed)
Pre visit review using our clinic review tool, if applicable. No additional management support is needed unless otherwise documented below in the visit note. 

## 2018-03-11 NOTE — Patient Instructions (Signed)
Rest, fluids , tylenol  For cough:  Take Robitussin  DM twice a day as needed until better  For nasal congestion: Use Rhinocort: 2 nasal sprays on each side of the nose in the morning until you feel better Use Saline spray   Avoid decongestants such as  Pseudoephedrine or phenylephrine   Claritin OTC as needed    Call if not gradually better over the next  Week  Call anytime if the symptoms are severe

## 2018-03-11 NOTE — Progress Notes (Signed)
Subjective:    Patient ID: Dana Henry, female    DOB: 08-17-1981, 37 y.o.   MRN: [id number]  DOS:  03/11/2018 Type of visit - description : Acute visit Interval history: Symptoms of started approximately 10 days ago, on and off Sinus congestion, mild cough she believes due to postnasal dripping. Right ear feels clogged. She is breast-feeding.   Review of Systems Denies fever or Chills. Chest does not seem congested.  No wheezing Denies sneezing, itchy eyes or itchy nose.  Past Medical History:  Diagnosis Date  . Anemia   . History of cesarean delivery affecting pregnancy 08/10/2017  . Kidney stones    surgically removed at 37YO and 37YO  . Migraines    associated with menstrual cycle  . UTI (urinary tract infection)   . Vaginal Pap smear, abnormal     Past Surgical History:  Procedure Laterality Date  . CESAREAN SECTION N/A 07/21/2014   Procedure: CESAREAN SECTION;  Surgeon: Elveria Royals, MD;  Location: [facility];  Service: Obstetrics;  Laterality: N/A;  . CESAREAN SECTION N/A 08/10/2017   Procedure: Repeat CESAREAN SECTION;  Surgeon: Azucena Fallen, MD;  Location: [facility];  Service: Obstetrics;  Laterality: N/A;  EDD: 08/17/17 Allergy: Sulfa  . KIDNEY STONE SURGERY    . TONSILLECTOMY AND ADENOIDECTOMY  1991    Social History   Socioeconomic History  . Marital status: Married    Spouse name: Gizel Riedlinger  . Number of children: Not on file  . Years of education: College  . Highest education level: Not on file  Occupational History  . Occupation: Geographical information systems officer  . Financial resource strain: Not on file  . Food insecurity:    Worry: Not on file    Inability: Not on file  . Transportation needs:    Medical: Not on file    Non-medical: Not on file  Tobacco Use  . Smoking status: Former Smoker    Packs/day: 0.50    Types: Cigarettes    Last attempt to quit: 10/19/2013    Years since quitting: 4.4  . Smokeless tobacco: Never Used    Substance and Sexual Activity  . Alcohol use: No  . Drug use: No  . Sexual activity: Yes    Birth control/protection: None  Lifestyle  . Physical activity:    Days per week: Not on file    Minutes per session: Not on file  . Stress: Not on file  Relationships  . Social connections:    Talks on phone: Not on file    Gets together: Not on file    Attends religious service: Not on file    Active member of club or organization: Not on file    Attends meetings of clubs or organizations: Not on file    Relationship status: Not on file  . Intimate partner violence:    Fear of current or ex partner: Not on file    Emotionally abused: Not on file    Physically abused: Not on file    Forced sexual activity: Not on file  Other Topics Concern  . Not on file  Social History Narrative   Lives in [place] with husband and mother and father and mother in [place]. Daughter 68months, Huel Coventry.      Work - Manufacturing systems engineer in [employer]      Diet - regular   Exercise - occasional walks      Caffinated Beverages: Yes   Herbal Remedies: No  Seat Belts: Yes   Bike Helmet: Yes   Exercise 3 Times a Week: No   Vegetarian: No   Eat Dairy Products:   Take Vitamins: Yes   Use Hearing Aid: No   Wear Dentures: No   Smoke Alarms in Home: Yes   Guns/Firearms: No   Physical Abuse: No      Hours of Sleep: 6-7   # of People in Home: 3                  Allergies as of 03/11/2018      Reactions   Sulfa Antibiotics Hives, Swelling      Medication List        Accurate as of 03/11/18 11:59 PM. Always use your most recent med list.          budesonide 32 MCG/ACT nasal spray Commonly known as:  RHINOCORT AQUA Place 2 sprays into both nostrils daily.          Objective:   Physical Exam BP 118/68 (BP Location: Left Arm, Patient Position: Sitting, Cuff Size: Normal)   Pulse 72   Temp (!) 97.4 F (36.3 C) (Oral)   Resp 14   Ht 5\' 5"  (1.651 m)   Wt 179 lb 8 oz (81.4 kg)   SpO2  98%   Breastfeeding? Yes   BMI 29.87 kg/m  General:   Well developed, well nourished . NAD.  HEENT:  Normocephalic . Face symmetric, atraumatic Nose congested, sinuses no TTP, TMs: Right side minimal redness, no bulge, left side normal. Throat: Symmetric, no red Lungs:  CTA B Normal respiratory effort, no intercostal retractions, no accessory muscle use. Heart: RRR,  no murmur.  No pretibial edema bilaterally  Skin: Not pale. Not jaundice Neurologic:  alert & oriented X3.  Speech normal, gait appropriate for age and unassisted Psych--  Cognition and judgment appear intact.  Cooperative with normal attention span and concentration.  Behavior appropriate. No anxious or depressed appearing.      Assessment & Plan:   37 year old female, history of migraines, currently breast-feeding, presents with URI: Recommend conservative treatment with Robitussin-DM, Rhinocort, saline sprays.  If not better will call for possibly an antibiotic prescription, see instructions

## 2020-03-20 NOTE — Patient Instructions (Signed)
Great to see you again today- I will be in touch with your labs asap Good luck with weight loss- exercise/ increasing muscle mass by lifting weights can help.  You might also explore intermittent fasting which does have ok evidence as a weight control strategy Mammogram after you next birthday!    Take care    Health Maintenance, Female Adopting a healthy lifestyle and getting preventive care are important in promoting health and wellness. Ask your health care provider about:  The right schedule for you to have regular tests and exams.  Things you can do on your own to prevent diseases and keep yourself healthy. What should I know about diet, weight, and exercise? Eat a healthy diet   Eat a diet that includes plenty of vegetables, fruits, low-fat dairy products, and lean protein.  Do not eat a lot of foods that are high in solid fats, added sugars, or sodium. Maintain a healthy weight Body mass index (BMI) is used to identify weight problems. It estimates body fat based on height and weight. Your health care provider can help determine your BMI and help you achieve or maintain a healthy weight. Get regular exercise Get regular exercise. This is one of the most important things you can do for your health. Most adults should:  Exercise for at least 150 minutes each week. The exercise should increase your heart rate and make you sweat (moderate-intensity exercise).  Do strengthening exercises at least twice a week. This is in addition to the moderate-intensity exercise.  Spend less time sitting. Even light physical activity can be beneficial. Watch cholesterol and blood lipids Have your blood tested for lipids and cholesterol at 40 years of age, then have this test every 5 years. Have your cholesterol levels checked more often if:  Your lipid or cholesterol levels are high.  You are older than 40 years of age.  You are at high risk for heart disease. What should I know about  cancer screening? Depending on your health history and family history, you may need to have cancer screening at various ages. This may include screening for:  Breast cancer.  Cervical cancer.  Colorectal cancer.  Skin cancer.  Lung cancer. What should I know about heart disease, diabetes, and high blood pressure? Blood pressure and heart disease  High blood pressure causes heart disease and increases the risk of stroke. This is more likely to develop in people who have high blood pressure readings, are of African descent, or are overweight.  Have your blood pressure checked: ? Every 3-5 years if you are 37-33 years of age. ? Every year if you are 37 years old or older. Diabetes Have regular diabetes screenings. This checks your fasting blood sugar level. Have the screening done:  Once every three years after age 27 if you are at a normal weight and have a low risk for diabetes.  More often and at a younger age if you are overweight or have a high risk for diabetes. What should I know about preventing infection? Hepatitis B If you have a higher risk for hepatitis B, you should be screened for this virus. Talk with your health care provider to find out if you are at risk for hepatitis B infection. Hepatitis C Testing is recommended for:  Everyone born from [birth year] through 1966.  Anyone with known risk factors for hepatitis C. Sexually transmitted infections (STIs)  Get screened for STIs, including gonorrhea and chlamydia, if: ? You are sexually active and are  younger than 40 years of age. ? You are older than 40 years of age and your health care provider tells you that you are at risk for this type of infection. ? Your sexual activity has changed since you were last screened, and you are at increased risk for chlamydia or gonorrhea. Ask your health care provider if you are at risk.  Ask your health care provider about whether you are at high risk for HIV. Your health care  provider may recommend a prescription medicine to help prevent HIV infection. If you choose to take medicine to prevent HIV, you should first get tested for HIV. You should then be tested every 3 months for as long as you are taking the medicine. Pregnancy  If you are about to stop having your period (premenopausal) and you may become pregnant, seek counseling before you get pregnant.  Take 400 to 800 micrograms (mcg) of folic acid every day if you become pregnant.  Ask for birth control (contraception) if you want to prevent pregnancy. Osteoporosis and menopause Osteoporosis is a disease in which the bones lose minerals and strength with aging. This can result in bone fractures. If you are 46 years old or older, or if you are at risk for osteoporosis and fractures, ask your health care provider if you should:  Be screened for bone loss.  Take a calcium or vitamin D supplement to lower your risk of fractures.  Be given hormone replacement therapy (HRT) to treat symptoms of menopause. Follow these instructions at home: Lifestyle  Do not use any products that contain nicotine or tobacco, such as cigarettes, e-cigarettes, and chewing tobacco. If you need help quitting, ask your health care provider.  Do not use street drugs.  Do not share needles.  Ask your health care provider for help if you need support or information about quitting drugs. Alcohol use  Do not drink alcohol if: ? Your health care provider tells you not to drink. ? You are pregnant, may be pregnant, or are planning to become pregnant.  If you drink alcohol: ? Limit how much you use to 0-1 drink a day. ? Limit intake if you are breastfeeding.  Be aware of how much alcohol is in your drink. In the [place] one drink equals one 12 oz bottle of beer (355 mL), one 5 oz glass of wine (148 mL), or one 1 oz glass of hard liquor (44 mL). General instructions  Schedule regular health, dental, and eye exams.  Stay current  with your vaccines.  Tell your health care provider if: ? You often feel depressed. ? You have ever been abused or do not feel safe at home. Summary  Adopting a healthy lifestyle and getting preventive care are important in promoting health and wellness.  Follow your health care provider's instructions about healthy diet, exercising, and getting tested or screened for diseases.  Follow your health care provider's instructions on monitoring your cholesterol and blood pressure. This information is not intended to replace advice given to you by your health care provider. Make sure you discuss any questions you have with your health care provider. Document Revised: 09/28/2018 Document Reviewed: 09/28/2018 Elsevier Patient Education  2020 [employer].

## 2020-03-20 NOTE — Progress Notes (Addendum)
[place] at [facility] [address], [place], [place] [address] [phone]  Date:  03/21/2020   Name:  Dana Henry   DOB:  [birth year]   MRN:  [id number]  PCP:  Darreld Mclean, MD    Chief Complaint: Annual Exam (no pap-sees gyn)   History of Present Illness:  Dana Henry is a 39 y.o. very pleasant female patient who presents with the following:  Generally healthy young woman here today for a physical exam History of migraine headaches Last seen by myself in 2018  Her OB/GYN is Dr. Benjie Karvonen She has two children- her girls are 61 and 2 yo.  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  She tries to keep a pretty clean diet, and has recently started exercising more, She is trying to walk for exercise- they will walk 30- 45 minutes of walking Occasional alcohol, no tobacco   Wt Readings from Last 3 Encounters:  03/21/20 189 lb (85.7 kg)  03/11/18 179 lb 8 oz (81.4 kg)  08/10/17 202 lb (91.6 kg)    Pap- per Dr Dimple Casey- per GYN, has not started yet  Labs- she is fasting today  covid vaccine- she had her first dose so far  She may get a HA prior to her menses and will feel quite tired.  Sx resolve once her menses start She may take motrin if needed Patient Active Problem List   Diagnosis Date Noted  . Migraines 10/06/2012    Past Medical History:  Diagnosis Date  . Anemia   . History of cesarean delivery affecting pregnancy 08/10/2017  . Kidney stones    surgically removed at 39YO and 39YO  . Migraines    associated with menstrual cycle  . UTI (urinary tract infection)   . Vaginal Pap smear, abnormal     Past Surgical History:   Procedure Laterality Date  . CESAREAN SECTION N/A 07/21/2014   Procedure: CESAREAN SECTION;  Surgeon: Elveria Royals, MD;  Location: [facility];  Service: Obstetrics;  Laterality: N/A;  . CESAREAN SECTION N/A 08/10/2017   Procedure: Repeat CESAREAN SECTION;  Surgeon: Azucena Fallen, MD;  Location: [facility];  Service: Obstetrics;  Laterality: N/A;  EDD: 08/17/17 Allergy: Sulfa  . KIDNEY STONE SURGERY    . TONSILLECTOMY AND ADENOIDECTOMY  1991    Social History   Tobacco Use  . Smoking status: Former Smoker    Packs/day: 0.50    Types: Cigarettes    Quit date: 10/19/2013    Years since quitting: 6.4  . Smokeless tobacco: Never Used  Substance Use Topics  . Alcohol use: No  . Drug use: No    Family History  Problem Relation Age of Onset  . Hypertension Mother   . Alcohol abuse Father     Allergies  Allergen Reactions  . Sulfa Antibiotics Hives and Swelling    Medication list has been reviewed and updated.  No current outpatient medications on file prior to visit.   No current facility-administered medications on file prior to visit.    Review of Systems:  As per HPI- otherwise negative.   Physical Examination: Vitals:  03/21/20 1019  BP: 112/70  Pulse: 68  Resp: 16  Temp: (!) 97.4 F (36.3 C)  SpO2: 99%   Vitals:   03/21/20 1019  Weight: 189 lb (85.7 kg)  Height: 5\' 5"  (1.651 m)   Body mass index is 31.45 kg/m. Ideal Body Weight: Weight in (lb) to have BMI = 25: 149.9  GEN: no acute distress.  Overweight, looks well  HEENT: Atraumatic, Normocephalic.   Bilateral TM wnl, oropharynx normal.  PEERL,EOMI.   Ears and Nose: No external deformity. CV: RRR, No M/G/R. No JVD. No thrill. No extra heart sounds. PULM: CTA B, no wheezes, crackles, rhonchi. No retractions. No resp. distress. No accessory muscle use. ABD: S, NT, ND, +BS. No rebound. No HSM. EXTR: No c/c/e PSYCH: Normally interactive. Conversant.    Assessment and Plan: Physical  exam  Screening for deficiency anemia - Plan: CBC  Screening for diabetes mellitus - Plan: Comprehensive metabolic panel, Hemoglobin A1c  Screening for thyroid disorder - Plan: TSH  Screening for hyperlipidemia - Plan: Lipid panel  Overweight  CPE today- overall doing well Labs pending as above- Will plan further follow- up pending labs.  We discussed strategies for weight control, check TSH She is not a smoker, rare etoh  IUTD mammo at age 79  This visit occurred during the SARS-CoV-2 public health emergency.  Safety protocols were in place, including screening questions prior to the visit, additional usage of staff PPE, and extensive cleaning of exam room while observing appropriate contact time as indicated for disinfecting solutions.    Signed Lamar Blinks, MD  Received her labs as below, message to patient  Results for orders placed or performed in visit on 03/21/20  CBC  Result Value Ref Range   WBC 4.4 4.0 - 10.5 K/uL   RBC 4.15 3.87 - 5.11 Mil/uL   Platelets 220.0 150.0 - 400.0 K/uL   Hemoglobin 12.8 12.0 - 15.0 g/dL   HCT 37.5 36.0 - 46.0 %   MCV 90.4 78.0 - 100.0 fl   MCHC 34.2 30.0 - 36.0 g/dL   RDW 12.9 11.5 - 15.5 %  Comprehensive metabolic panel  Result Value Ref Range   Sodium 138 135 - 145 mEq/L   Potassium 4.3 3.5 - 5.1 mEq/L   Chloride 107 96 - 112 mEq/L   CO2 27 19 - 32 mEq/L   Glucose, Bld 83 70 - 99 mg/dL   BUN 14 6 - 23 mg/dL   Creatinine, Ser 0.84 0.40 - 1.20 mg/dL   Total Bilirubin 0.5 0.2 - 1.2 mg/dL   Alkaline Phosphatase 32 (L) 39 - 117 U/L   AST 17 0 - 37 U/L   ALT 18 0 - 35 U/L   Total Protein 6.7 6.0 - 8.3 g/dL   Albumin 4.5 3.5 - 5.2 g/dL   GFR 75.31 >60.00 mL/min   Calcium 9.1 8.4 - 10.5 mg/dL  Hemoglobin A1c  Result Value Ref Range   Hgb A1c MFr Bld 5.6 4.6 - 6.5 %  Lipid panel  Result Value Ref Range   Cholesterol 175 0 - 200 mg/dL   Triglycerides 106.0 0.0 - 149.0 mg/dL   HDL 42.10 >39.00 mg/dL   VLDL 21.2 0.0 - 40.0  mg/dL   LDL Cholesterol 112 (H) 0 - 99 mg/dL   Total CHOL/HDL Ratio 4    NonHDL 132.72   TSH  Result Value Ref Range   TSH 0.95 0.35 - 4.50 uIU/mL

## 2020-03-21 ENCOUNTER — Ambulatory Visit (INDEPENDENT_AMBULATORY_CARE_PROVIDER_SITE_OTHER): Payer: BC Managed Care – PPO | Admitting: Family Medicine

## 2020-03-21 ENCOUNTER — Other Ambulatory Visit: Payer: Self-pay

## 2020-03-21 ENCOUNTER — Encounter: Payer: Self-pay | Admitting: Family Medicine

## 2020-03-21 VITALS — BP 112/70 | HR 68 | Temp 97.4°F | Resp 16 | Ht 65.0 in | Wt 189.0 lb

## 2020-03-21 DIAGNOSIS — Z1329 Encounter for screening for other suspected endocrine disorder: Secondary | ICD-10-CM

## 2020-03-21 DIAGNOSIS — Z131 Encounter for screening for diabetes mellitus: Secondary | ICD-10-CM

## 2020-03-21 DIAGNOSIS — Z13220 Encounter for screening for lipoid disorders: Secondary | ICD-10-CM

## 2020-03-21 DIAGNOSIS — Z130 Encounter for screening for diseases of the blood and blood-forming organs and certain disorders involving the immune mechanism: Secondary | ICD-10-CM

## 2020-03-21 DIAGNOSIS — Z0000 Encounter for general adult medical examination without abnormal findings: Secondary | ICD-10-CM

## 2020-03-21 DIAGNOSIS — E663 Overweight: Secondary | ICD-10-CM

## 2020-03-21 LAB — HEMOGLOBIN A1C: Hgb A1c MFr Bld: 5.6 % (ref 4.6–6.5)

## 2020-03-21 LAB — TSH: TSH: 0.95 u[IU]/mL (ref 0.35–4.50)

## 2020-03-21 LAB — COMPREHENSIVE METABOLIC PANEL
ALT: 18 U/L (ref 0–35)
AST: 17 U/L (ref 0–37)
Albumin: 4.5 g/dL (ref 3.5–5.2)
Alkaline Phosphatase: 32 U/L — ABNORMAL LOW (ref 39–117)
BUN: 14 mg/dL (ref 6–23)
CO2: 27 mEq/L (ref 19–32)
Calcium: 9.1 mg/dL (ref 8.4–10.5)
Chloride: 107 mEq/L (ref 96–112)
Creatinine, Ser: 0.84 mg/dL (ref 0.40–1.20)
GFR: 75.31 mL/min (ref >60.00)
Glucose, Bld: 83 mg/dL (ref 70–99)
Potassium: 4.3 mEq/L (ref 3.5–5.1)
Sodium: 138 mEq/L (ref 135–145)
Total Bilirubin: 0.5 mg/dL (ref 0.2–1.2)
Total Protein: 6.7 g/dL (ref 6.0–8.3)

## 2020-03-21 LAB — LIPID PANEL
Cholesterol: 175 mg/dL (ref 0–200)
HDL: 42.1 mg/dL (ref >39.00)
LDL Cholesterol: 112 mg/dL — ABNORMAL HIGH (ref 0–99)
NonHDL: 132.72
Total CHOL/HDL Ratio: 4
Triglycerides: 106 mg/dL (ref 0.0–149.0)
VLDL: 21.2 mg/dL (ref 0.0–40.0)

## 2020-03-21 LAB — CBC
HCT: 37.5 % (ref 36.0–46.0)
Hemoglobin: 12.8 g/dL (ref 12.0–15.0)
MCHC: 34.2 g/dL (ref 30.0–36.0)
MCV: 90.4 fl (ref 78.0–100.0)
Platelets: 220 10*3/uL (ref 150.0–400.0)
RBC: 4.15 Mil/uL (ref 3.87–5.11)
RDW: 12.9 % (ref 11.5–15.5)
WBC: 4.4 10*3/uL (ref 4.0–10.5)

## 2020-11-12 ENCOUNTER — Encounter (HOSPITAL_BASED_OUTPATIENT_CLINIC_OR_DEPARTMENT_OTHER): Payer: Self-pay

## 2020-11-12 ENCOUNTER — Other Ambulatory Visit: Payer: Self-pay

## 2020-11-12 DIAGNOSIS — R519 Headache, unspecified: Secondary | ICD-10-CM | POA: Insufficient documentation

## 2020-11-12 DIAGNOSIS — Z5321 Procedure and treatment not carried out due to patient leaving prior to being seen by health care provider: Secondary | ICD-10-CM | POA: Insufficient documentation

## 2020-11-12 DIAGNOSIS — R630 Anorexia: Secondary | ICD-10-CM | POA: Insufficient documentation

## 2020-11-12 DIAGNOSIS — R509 Fever, unspecified: Secondary | ICD-10-CM | POA: Insufficient documentation

## 2020-11-12 DIAGNOSIS — R110 Nausea: Secondary | ICD-10-CM | POA: Insufficient documentation

## 2020-11-12 DIAGNOSIS — R5383 Other fatigue: Secondary | ICD-10-CM | POA: Insufficient documentation

## 2020-11-12 NOTE — ED Triage Notes (Signed)
Pt has been having fatigue, fever, decreased appetite, headache & nausea x 3 days. Took tylenol 1 hour pta, had temp of 103.39f.

## 2020-11-13 ENCOUNTER — Emergency Department (HOSPITAL_COMMUNITY): Payer: BC Managed Care – PPO

## 2020-11-13 ENCOUNTER — Inpatient Hospital Stay (HOSPITAL_COMMUNITY): Payer: BC Managed Care – PPO

## 2020-11-13 ENCOUNTER — Emergency Department (HOSPITAL_COMMUNITY): Payer: BC Managed Care – PPO | Admitting: Certified Registered Nurse Anesthetist

## 2020-11-13 ENCOUNTER — Emergency Department (HOSPITAL_BASED_OUTPATIENT_CLINIC_OR_DEPARTMENT_OTHER)
Admission: EM | Admit: 2020-11-13 | Discharge: 2020-11-13 | Disposition: A | Discharge status: Home / Self Care | Payer: BC Managed Care – PPO | Source: Home / Self Care

## 2020-11-13 ENCOUNTER — Inpatient Hospital Stay (HOSPITAL_COMMUNITY)
Admission: EM | Admit: 2020-11-13 | Discharge: 2020-11-18 | DRG: 024 | Disposition: A | Discharge status: Rehabilitation Facility | Payer: BC Managed Care – PPO | Attending: Neurology | Admitting: Neurology

## 2020-11-13 ENCOUNTER — Encounter (HOSPITAL_COMMUNITY)
Admission: EM | Disposition: A | Discharge status: Rehabilitation Facility | Payer: Self-pay | Source: Home / Self Care | Attending: Neurology

## 2020-11-13 ENCOUNTER — Encounter (HOSPITAL_COMMUNITY): Payer: Self-pay

## 2020-11-13 DIAGNOSIS — Z87440 Personal history of urinary (tract) infections: Secondary | ICD-10-CM

## 2020-11-13 DIAGNOSIS — R29710 NIHSS score 10: Secondary | ICD-10-CM | POA: Diagnosis not present

## 2020-11-13 DIAGNOSIS — Z87442 Personal history of urinary calculi: Secondary | ICD-10-CM | POA: Diagnosis not present

## 2020-11-13 DIAGNOSIS — E669 Obesity, unspecified: Secondary | ICD-10-CM | POA: Diagnosis not present

## 2020-11-13 DIAGNOSIS — Q211 Atrial septal defect: Secondary | ICD-10-CM

## 2020-11-13 DIAGNOSIS — I63231 Cerebral infarction due to unspecified occlusion or stenosis of right carotid arteries: Secondary | ICD-10-CM | POA: Diagnosis not present

## 2020-11-13 DIAGNOSIS — I63511 Cerebral infarction due to unspecified occlusion or stenosis of right middle cerebral artery: Secondary | ICD-10-CM | POA: Diagnosis not present

## 2020-11-13 DIAGNOSIS — Z98891 History of uterine scar from previous surgery: Secondary | ICD-10-CM | POA: Diagnosis not present

## 2020-11-13 DIAGNOSIS — I63411 Cerebral infarction due to embolism of right middle cerebral artery: Principal | ICD-10-CM | POA: Diagnosis present

## 2020-11-13 DIAGNOSIS — H518 Other specified disorders of binocular movement: Secondary | ICD-10-CM | POA: Diagnosis present

## 2020-11-13 DIAGNOSIS — R29810 Facial weakness: Secondary | ICD-10-CM | POA: Diagnosis not present

## 2020-11-13 DIAGNOSIS — R1310 Dysphagia, unspecified: Secondary | ICD-10-CM | POA: Diagnosis not present

## 2020-11-13 DIAGNOSIS — I081 Rheumatic disorders of both mitral and tricuspid valves: Secondary | ICD-10-CM | POA: Diagnosis not present

## 2020-11-13 DIAGNOSIS — I10 Essential (primary) hypertension: Secondary | ICD-10-CM | POA: Diagnosis not present

## 2020-11-13 DIAGNOSIS — G8194 Hemiplegia, unspecified affecting left nondominant side: Secondary | ICD-10-CM | POA: Diagnosis present

## 2020-11-13 DIAGNOSIS — E441 Mild protein-calorie malnutrition: Secondary | ICD-10-CM | POA: Diagnosis not present

## 2020-11-13 DIAGNOSIS — R29720 NIHSS score 20: Secondary | ICD-10-CM | POA: Diagnosis not present

## 2020-11-13 DIAGNOSIS — E46 Unspecified protein-calorie malnutrition: Secondary | ICD-10-CM | POA: Diagnosis not present

## 2020-11-13 DIAGNOSIS — Z6830 Body mass index (BMI) 30.0-30.9, adult: Secondary | ICD-10-CM | POA: Diagnosis not present

## 2020-11-13 DIAGNOSIS — I6521 Occlusion and stenosis of right carotid artery: Secondary | ICD-10-CM | POA: Diagnosis present

## 2020-11-13 DIAGNOSIS — R29712 NIHSS score 12: Secondary | ICD-10-CM | POA: Diagnosis not present

## 2020-11-13 DIAGNOSIS — U071 COVID-19: Secondary | ICD-10-CM | POA: Diagnosis not present

## 2020-11-13 DIAGNOSIS — Z87891 Personal history of nicotine dependence: Secondary | ICD-10-CM | POA: Diagnosis not present

## 2020-11-13 DIAGNOSIS — E7800 Pure hypercholesterolemia, unspecified: Secondary | ICD-10-CM | POA: Diagnosis not present

## 2020-11-13 DIAGNOSIS — K5901 Slow transit constipation: Secondary | ICD-10-CM | POA: Diagnosis not present

## 2020-11-13 DIAGNOSIS — E785 Hyperlipidemia, unspecified: Secondary | ICD-10-CM | POA: Diagnosis present

## 2020-11-13 DIAGNOSIS — E6609 Other obesity due to excess calories: Secondary | ICD-10-CM | POA: Diagnosis not present

## 2020-11-13 DIAGNOSIS — R29740 NIHSS score 40: Secondary | ICD-10-CM | POA: Diagnosis not present

## 2020-11-13 DIAGNOSIS — R29818 Other symptoms and signs involving the nervous system: Secondary | ICD-10-CM | POA: Diagnosis not present

## 2020-11-13 DIAGNOSIS — I69391 Dysphagia following cerebral infarction: Secondary | ICD-10-CM | POA: Diagnosis not present

## 2020-11-13 DIAGNOSIS — Z8249 Family history of ischemic heart disease and other diseases of the circulatory system: Secondary | ICD-10-CM | POA: Diagnosis not present

## 2020-11-13 DIAGNOSIS — R29723 NIHSS score 23: Secondary | ICD-10-CM | POA: Diagnosis not present

## 2020-11-13 DIAGNOSIS — D62 Acute posthemorrhagic anemia: Secondary | ICD-10-CM

## 2020-11-13 DIAGNOSIS — Z79899 Other long term (current) drug therapy: Secondary | ICD-10-CM | POA: Diagnosis not present

## 2020-11-13 DIAGNOSIS — E8809 Other disorders of plasma-protein metabolism, not elsewhere classified: Secondary | ICD-10-CM | POA: Diagnosis not present

## 2020-11-13 DIAGNOSIS — Z95820 Peripheral vascular angioplasty status with implants and grafts: Secondary | ICD-10-CM | POA: Diagnosis not present

## 2020-11-13 DIAGNOSIS — G8190 Hemiplegia, unspecified affecting unspecified side: Secondary | ICD-10-CM | POA: Diagnosis not present

## 2020-11-13 DIAGNOSIS — R509 Fever, unspecified: Secondary | ICD-10-CM

## 2020-11-13 DIAGNOSIS — I69354 Hemiplegia and hemiparesis following cerebral infarction affecting left non-dominant side: Secondary | ICD-10-CM | POA: Diagnosis not present

## 2020-11-13 DIAGNOSIS — R21 Rash and other nonspecific skin eruption: Secondary | ICD-10-CM | POA: Diagnosis not present

## 2020-11-13 DIAGNOSIS — G43909 Migraine, unspecified, not intractable, without status migrainosus: Secondary | ICD-10-CM | POA: Diagnosis not present

## 2020-11-13 DIAGNOSIS — R059 Cough, unspecified: Secondary | ICD-10-CM

## 2020-11-13 DIAGNOSIS — F1210 Cannabis abuse, uncomplicated: Secondary | ICD-10-CM | POA: Diagnosis not present

## 2020-11-13 DIAGNOSIS — Z9889 Other specified postprocedural states: Secondary | ICD-10-CM | POA: Diagnosis not present

## 2020-11-13 DIAGNOSIS — Z882 Allergy status to sulfonamides status: Secondary | ICD-10-CM

## 2020-11-13 DIAGNOSIS — Z23 Encounter for immunization: Secondary | ICD-10-CM | POA: Diagnosis not present

## 2020-11-13 DIAGNOSIS — Z8616 Personal history of COVID-19: Secondary | ICD-10-CM | POA: Diagnosis not present

## 2020-11-13 DIAGNOSIS — G4489 Other headache syndrome: Secondary | ICD-10-CM | POA: Diagnosis not present

## 2020-11-13 DIAGNOSIS — Z20822 Contact with and (suspected) exposure to covid-19: Secondary | ICD-10-CM | POA: Diagnosis not present

## 2020-11-13 DIAGNOSIS — I6389 Other cerebral infarction: Secondary | ICD-10-CM | POA: Diagnosis not present

## 2020-11-13 DIAGNOSIS — Z811 Family history of alcohol abuse and dependence: Secondary | ICD-10-CM | POA: Diagnosis not present

## 2020-11-13 DIAGNOSIS — I639 Cerebral infarction, unspecified: Secondary | ICD-10-CM | POA: Diagnosis not present

## 2020-11-13 HISTORY — PX: IR PERCUTANEOUS ART THROMBECTOMY/INFUSION INTRACRANIAL INC DIAG ANGIO: IMG6087

## 2020-11-13 HISTORY — PX: IR CT HEAD LTD: IMG2386

## 2020-11-13 HISTORY — PX: IR INTRA CRAN STENT: IMG2345

## 2020-11-13 HISTORY — PX: RADIOLOGY WITH ANESTHESIA: SHX6223

## 2020-11-13 LAB — I-STAT CHEM 8, ED
BUN: 13 mg/dL (ref 6–20)
Calcium, Ion: 1.09 mmol/L — ABNORMAL LOW (ref 1.15–1.40)
Chloride: 106 mmol/L (ref 98–111)
Creatinine, Ser: 0.9 mg/dL (ref 0.44–1.00)
Glucose, Bld: 90 mg/dL (ref 70–99)
HCT: 42 % (ref 36.0–46.0)
Hemoglobin: 14.3 g/dL (ref 12.0–15.0)
Potassium: 3.6 mmol/L (ref 3.5–5.1)
Sodium: 139 mmol/L (ref 135–145)
TCO2: 23 mmol/L (ref 22–32)

## 2020-11-13 LAB — CBC
HCT: 41.9 % (ref 36.0–46.0)
Hemoglobin: 14 g/dL (ref 12.0–15.0)
MCH: 30.3 pg (ref 26.0–34.0)
MCHC: 33.4 g/dL (ref 30.0–36.0)
MCV: 90.7 fL (ref 80.0–100.0)
Platelets: 228 10*3/uL (ref 150–400)
RBC: 4.62 MIL/uL (ref 3.87–5.11)
RDW: 12.3 % (ref 11.5–15.5)
WBC: 8 10*3/uL (ref 4.0–10.5)
nRBC: 0 % (ref 0.0–0.2)

## 2020-11-13 LAB — DIFFERENTIAL
Abs Immature Granulocytes: 0.02 10*3/uL (ref 0.00–0.07)
Basophils Absolute: 0 10*3/uL (ref 0.0–0.1)
Basophils Relative: 1 %
Eosinophils Absolute: 0 10*3/uL (ref 0.0–0.5)
Eosinophils Relative: 1 %
Immature Granulocytes: 0 %
Lymphocytes Relative: 19 %
Lymphs Abs: 1.5 10*3/uL (ref 0.7–4.0)
Monocytes Absolute: 0.5 10*3/uL (ref 0.1–1.0)
Monocytes Relative: 6 %
Neutro Abs: 5.9 10*3/uL (ref 1.7–7.7)
Neutrophils Relative %: 73 %

## 2020-11-13 LAB — PROTIME-INR
INR: 1.1 (ref 0.8–1.2)
Prothrombin Time: 13.9 seconds (ref 11.4–15.2)

## 2020-11-13 LAB — COMPREHENSIVE METABOLIC PANEL
ALT: 37 U/L (ref 0–44)
AST: 27 U/L (ref 15–41)
Albumin: 4.3 g/dL (ref 3.5–5.0)
Alkaline Phosphatase: 33 U/L — ABNORMAL LOW (ref 38–126)
Anion gap: 10 (ref 5–15)
BUN: 11 mg/dL (ref 6–20)
CO2: 23 mmol/L (ref 22–32)
Calcium: 9.3 mg/dL (ref 8.9–10.3)
Chloride: 104 mmol/L (ref 98–111)
Creatinine, Ser: 1.06 mg/dL — ABNORMAL HIGH (ref 0.44–1.00)
GFR, Estimated: >60 mL/min (ref >60)
Glucose, Bld: 94 mg/dL (ref 70–99)
Potassium: 3.7 mmol/L (ref 3.5–5.1)
Sodium: 137 mmol/L (ref 135–145)
Total Bilirubin: 1.3 mg/dL — ABNORMAL HIGH (ref 0.3–1.2)
Total Protein: 7.4 g/dL (ref 6.5–8.1)

## 2020-11-13 LAB — APTT: aPTT: 28 seconds (ref 24–36)

## 2020-11-13 LAB — I-STAT BETA HCG BLOOD, ED (MC, WL, AP ONLY): I-stat hCG, quantitative: <5 m[IU]/mL (ref <5)

## 2020-11-13 LAB — CBG MONITORING, ED: Glucose-Capillary: 91 mg/dL (ref 70–99)

## 2020-11-13 LAB — SARS CORONAVIRUS 2 BY RT PCR (HOSPITAL ORDER, PERFORMED IN ~~LOC~~ HOSPITAL LAB): SARS Coronavirus 2: NEGATIVE

## 2020-11-13 LAB — MRSA PCR SCREENING: MRSA by PCR: NEGATIVE

## 2020-11-13 IMAGING — DX DG CHEST 1V PORT
1 series · 1 of 1 positions shown · non-contrast
Comparison: None.

CLINICAL DATA: 39-year-old female with cough.

EXAM:
PORTABLE CHEST 1 VIEW

[chest ap]
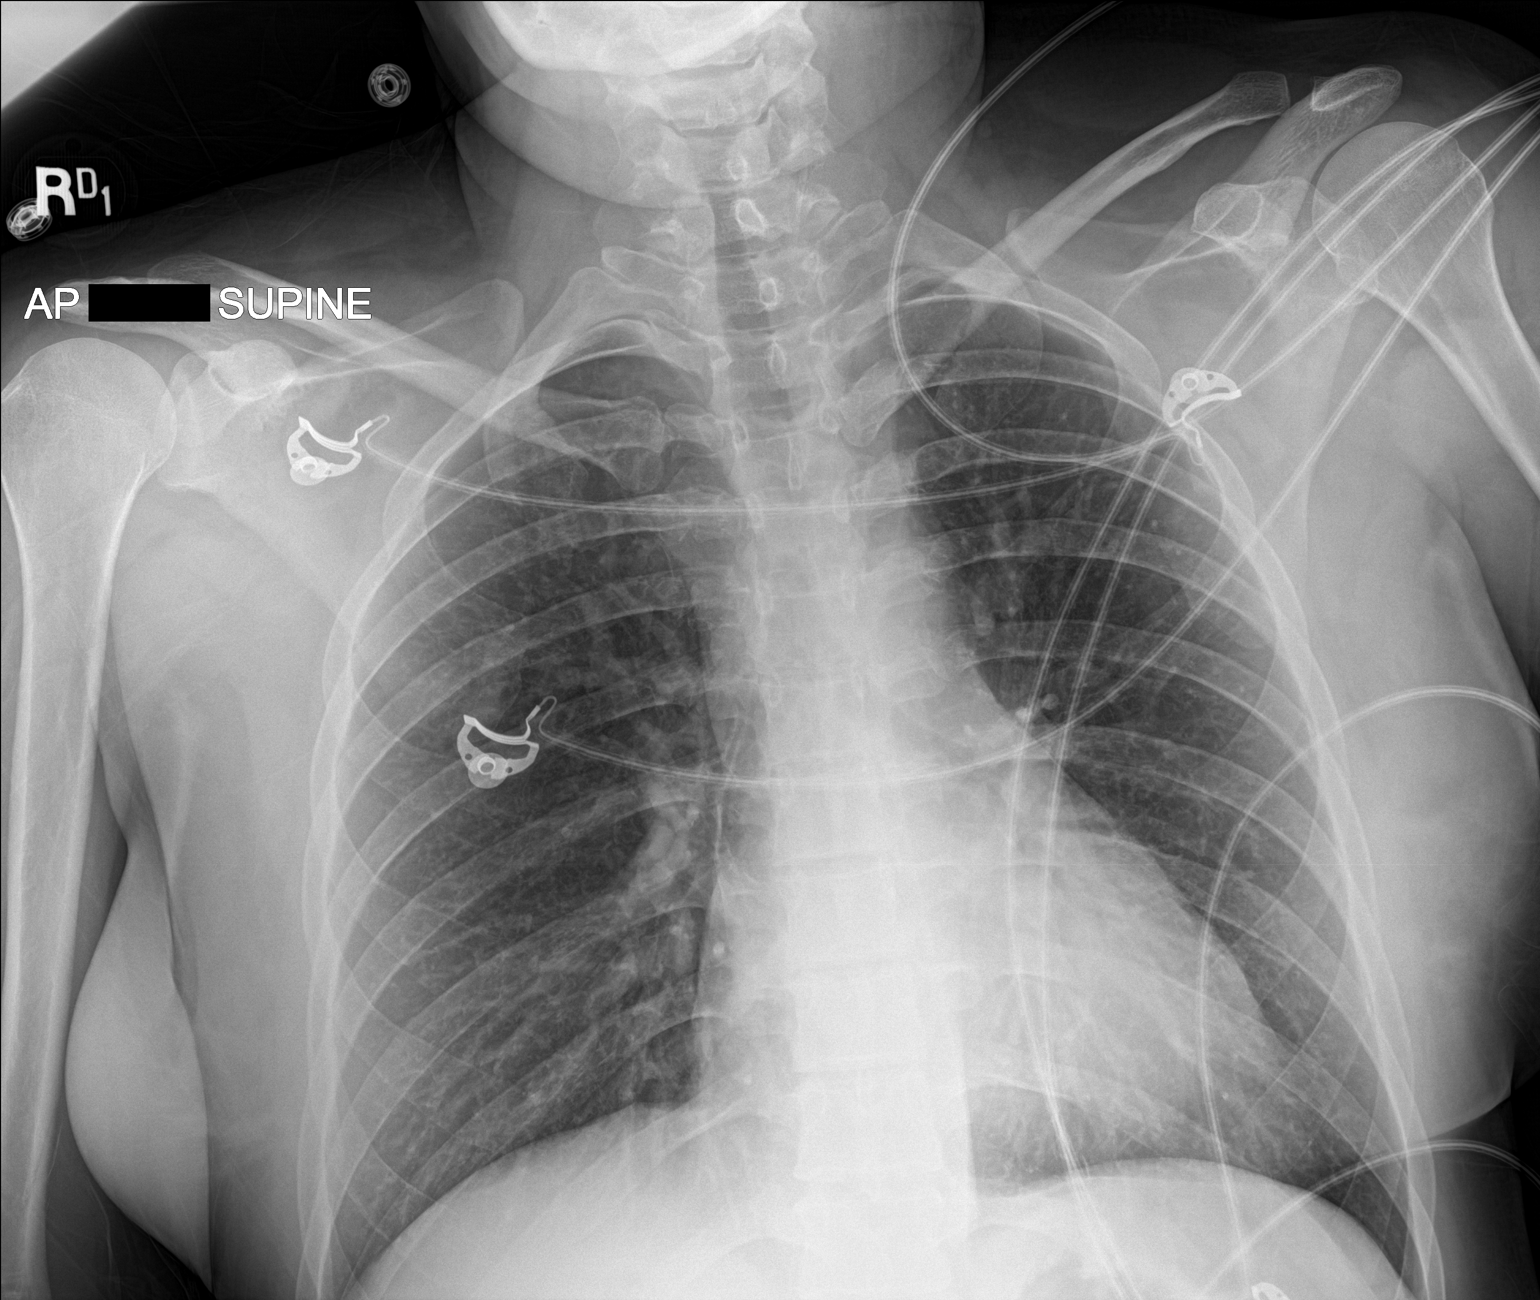

[1 of 1 positions shown; findings below may reference images not displayed]

FINDINGS: No focal consolidation, pleural effusion or pneumothorax. The
cardiac silhouette is within limits. No acute osseous pathology.
IMPRESSION: No active disease.

## 2020-11-13 IMAGING — CT CT HEAD W/O CM
4 series · 16 of 47 positions shown, 18 images · non-contrast
Comparison: Head CT and CTA earlier today

CLINICAL DATA: Stroke follow-up. Status post emergent stent
assisted angioplasty of right ICA petrous segment stenosis.

EXAM:
CT HEAD WITHOUT CONTRAST
TECHNIQUE: Contiguous axial images were obtained from the base of the skull
through the vertex without intravenous contrast.

[Series 3: head without · axial · non-contrast · 0.46mm/px · z∈[+1326,+1446]mm · 7 of 34 slices shown, 9 images]
[im 5/34  brain]
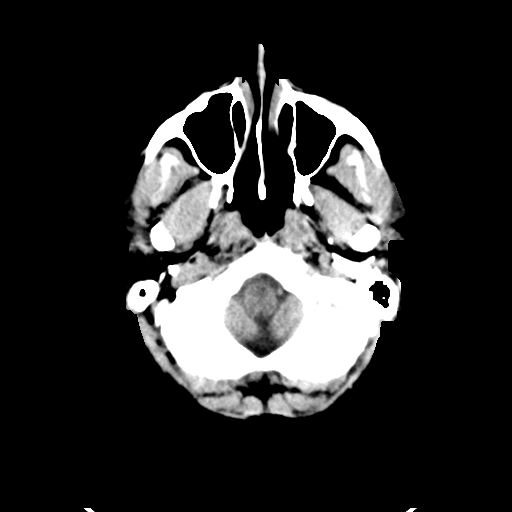
[im 5/34  bone]
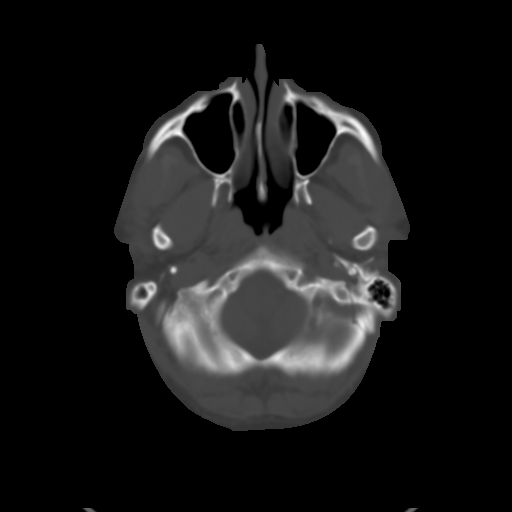
[im 9/34  brain]
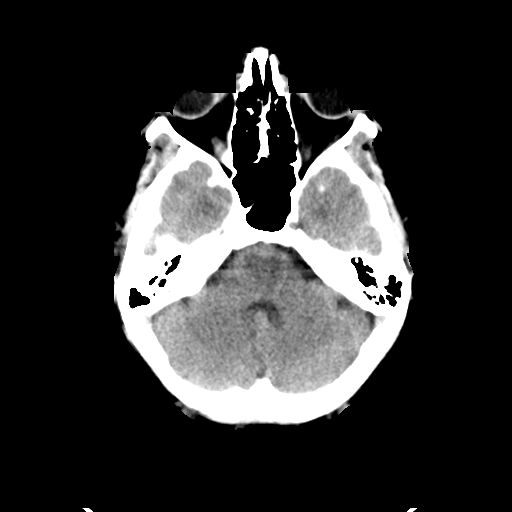
[im 13/34  brain]
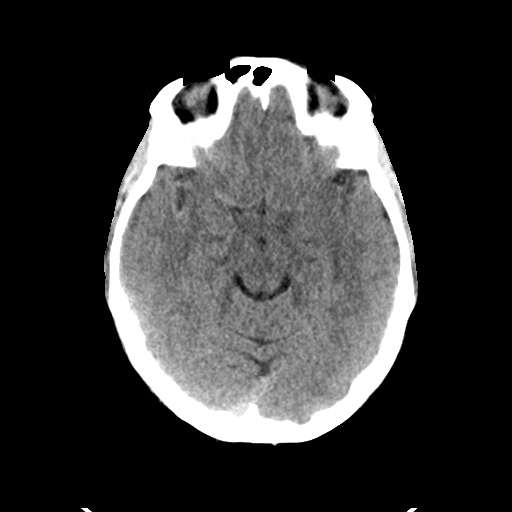
[im 17/34  brain]
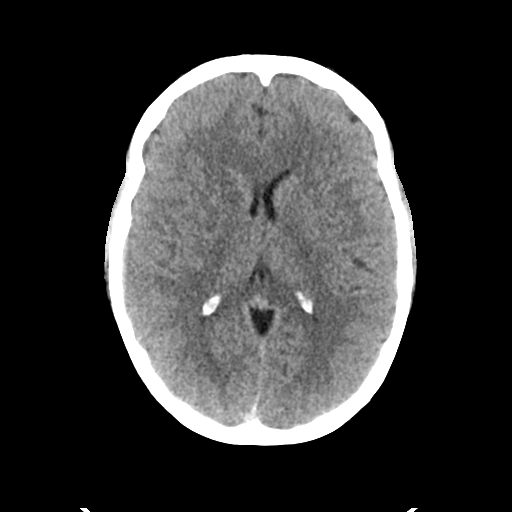
[im 21/34  brain]
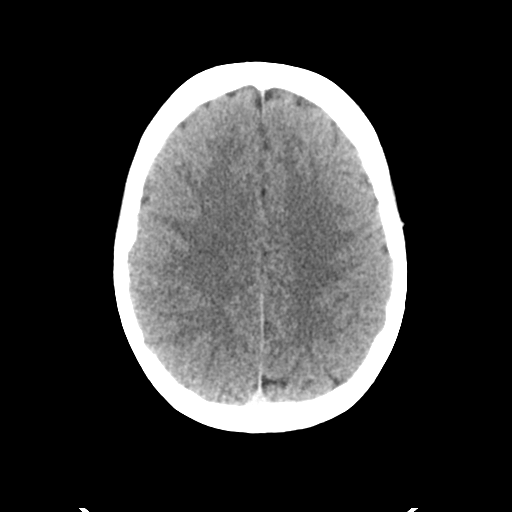
[im 21/34  bone]
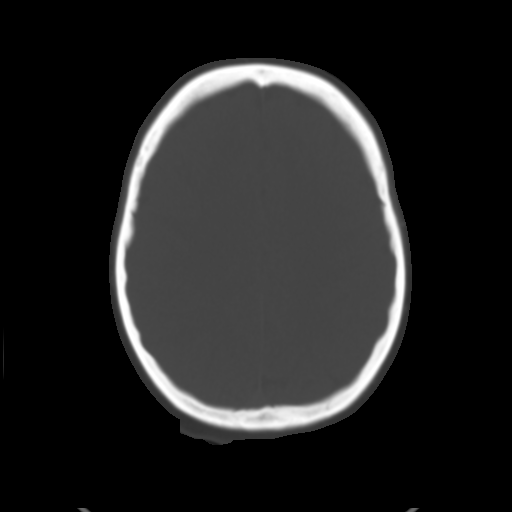
[im 25/34  brain]
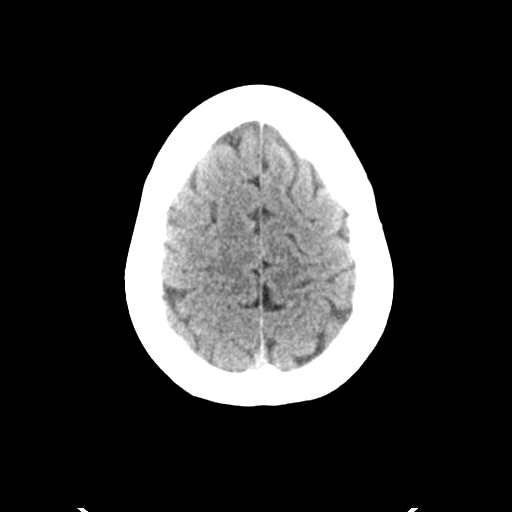
[im 29/34  brain]
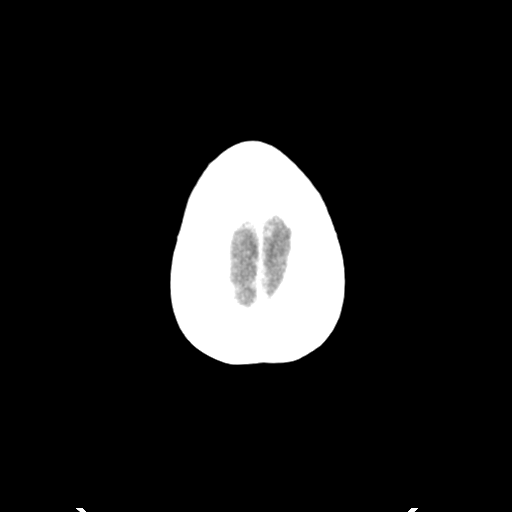

[Series 4: head bone · axial · 0.46mm/px · z∈[+1322,+1354]mm · 3 of 83 slices shown]
[im 9/83  bone]
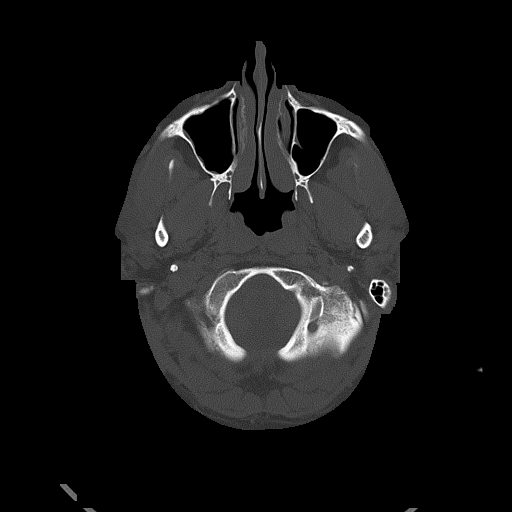
[im 17/83  bone]
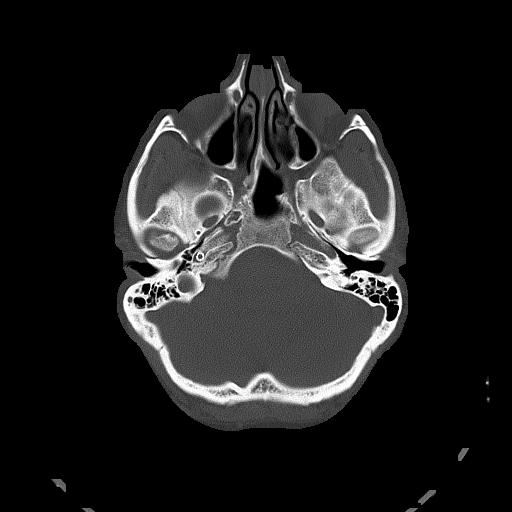
[im 25/83  bone]
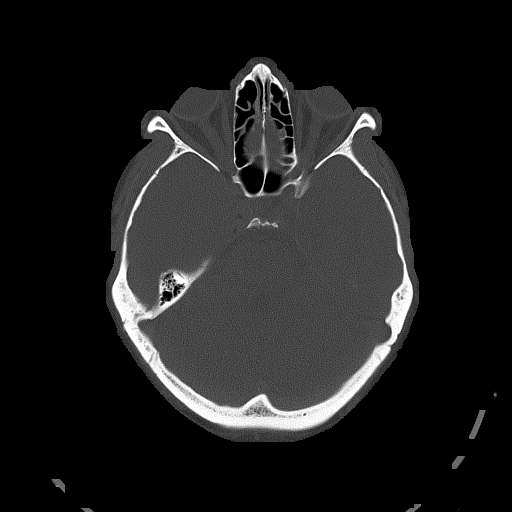

[Series 5: head without cor · coronal · non-contrast · 0.32mm/px · 3 of 73 slices shown]
[im 25/73  brain]
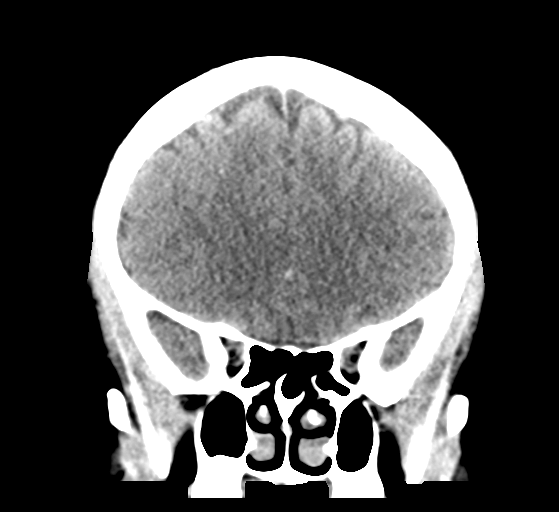
[im 33/73  brain]
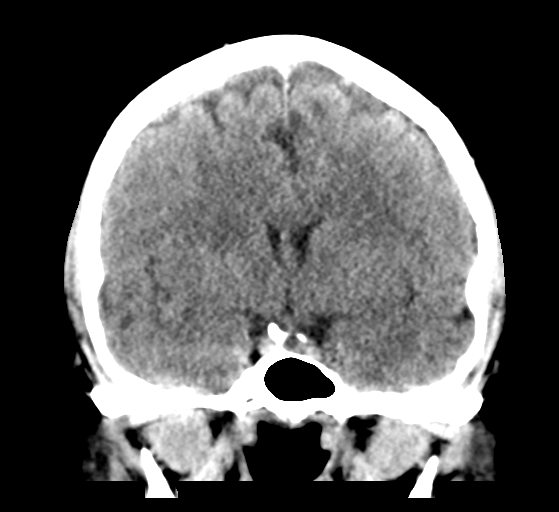
[im 41/73  brain]
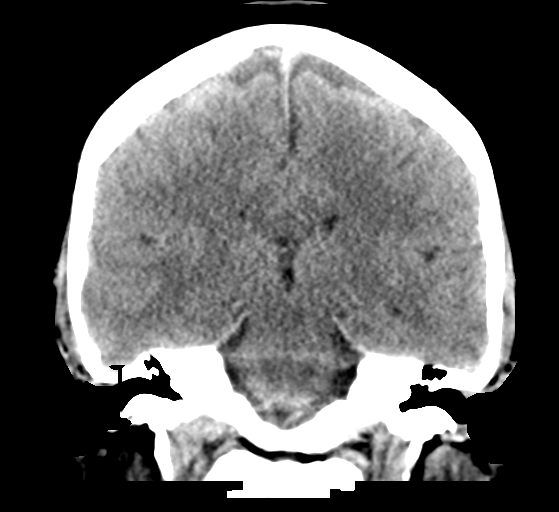

[Series 6: head without sag · sagittal · non-contrast · 0.32mm/px · 3 of 67 slices shown]
[im 23/67  brain]
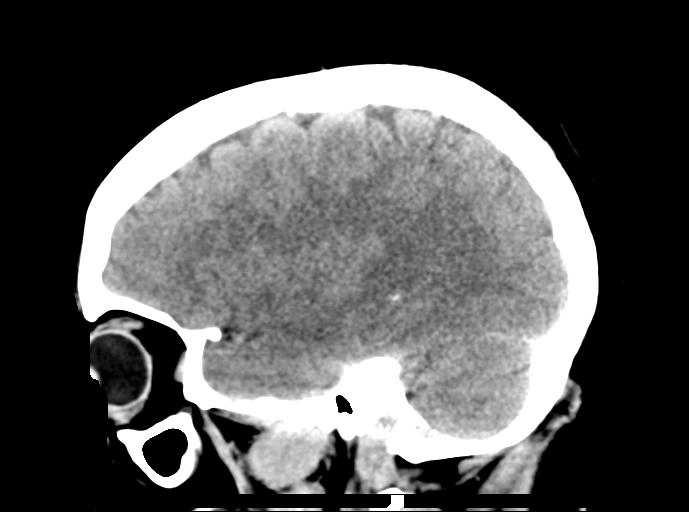
[im 34/67  brain]
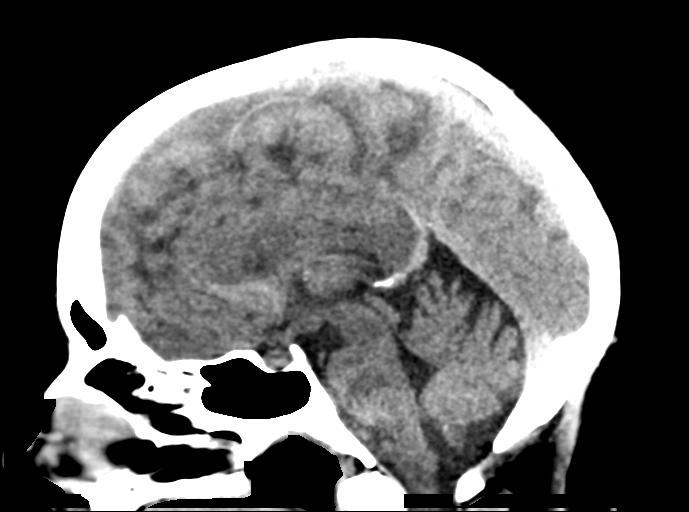
[im 45/67  brain]
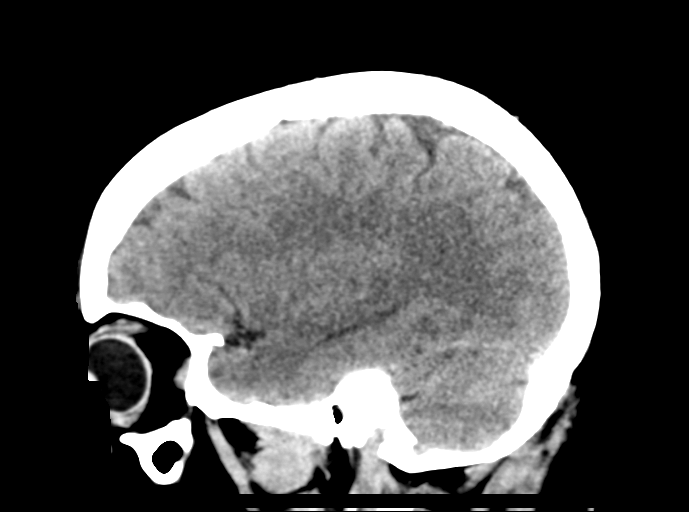

[16 of 47 positions shown; findings below may reference images not displayed]

FINDINGS: Brain: Hypodensity is again seen in the right basal ganglia and
adjacent deep white matter corresponding to the previously described
acute infarct. No significant infarct extension, new infarct,
intracranial hemorrhage, mass, midline shift, or extra-axial fluid
collection is identified. The ventricles and sulci are normal.

Vascular: Interval right petrous ICA stenting.

Skull: No fracture or suspicious osseous lesion.

Sinuses/Orbits: Mild right maxillary sinus mucosal thickening. Clear
mastoid air cells. Unremarkable orbits.

Other: None.
IMPRESSION: 1. Unchanged acute right basal ganglia infarct.
2. No evidence of hemorrhage or other new intracranial abnormality.

## 2020-11-13 IMAGING — CT CT HEAD CODE STROKE
3 series · 15 of 47 positions shown, 18 images · non-contrast
Comparison: None.

CLINICAL DATA: Code stroke.  Right-sided gaze, facial droop

EXAM:
CT HEAD WITHOUT CONTRAST
TECHNIQUE: Contiguous axial images were obtained from the base of the skull
through the vertex without intravenous contrast.

[Series 2: head 5.0 h30s · axial · 0.42mm/px · z∈[-178,-43]mm · 9 of 33 slices shown, 12 images]
[im 3/33  brain]
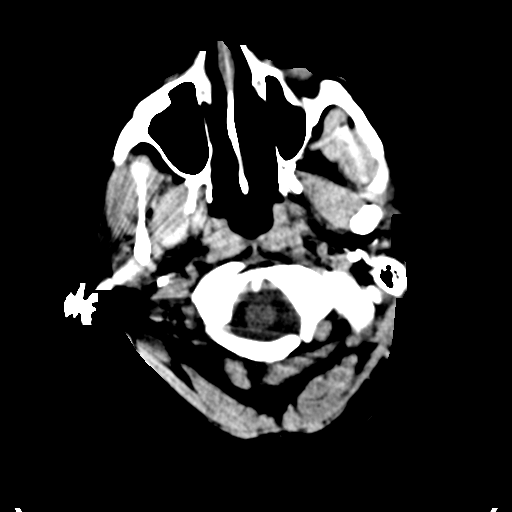
[im 3/33  bone]
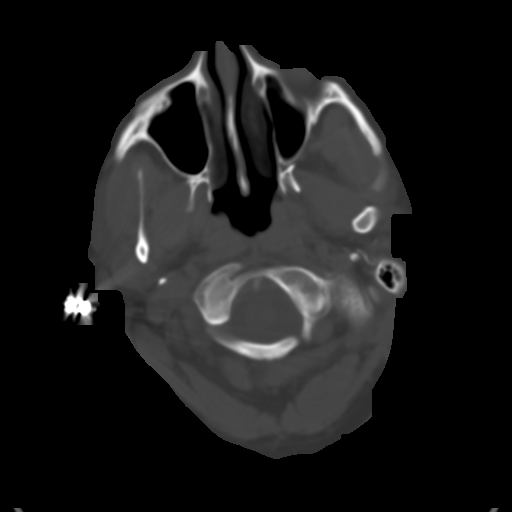
[im 6/33  brain]
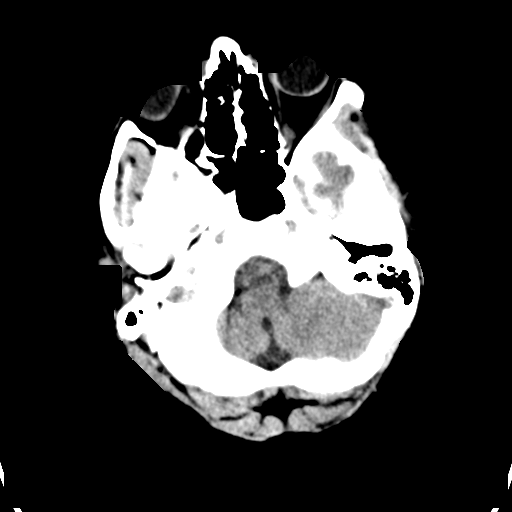
[im 9/33  brain]
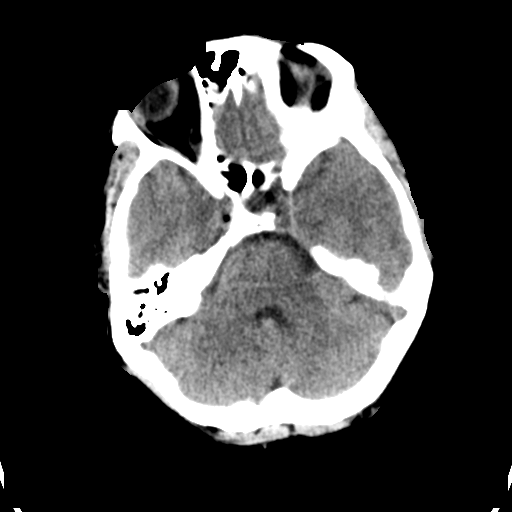
[im 13/33  brain]
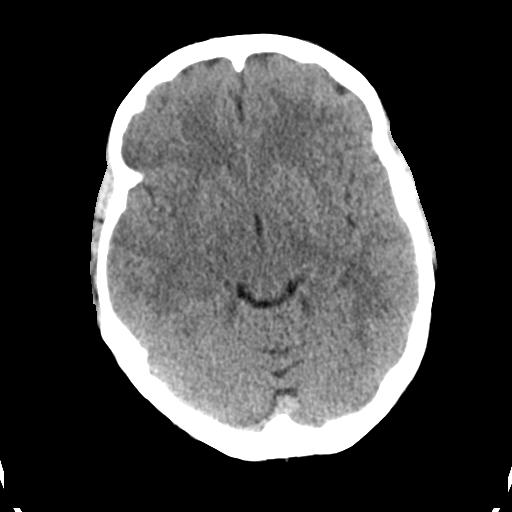
[im 17/33  brain]
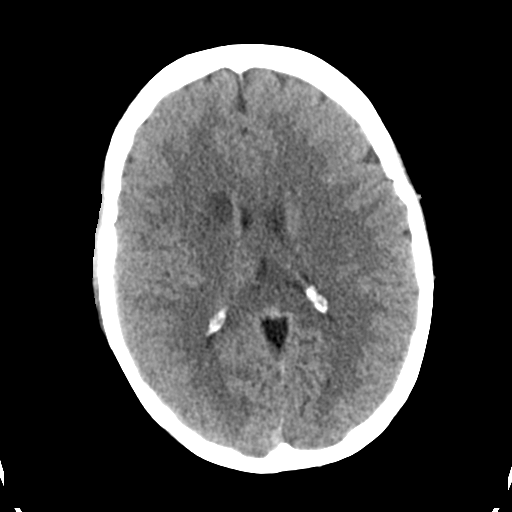
[im 17/33  bone]
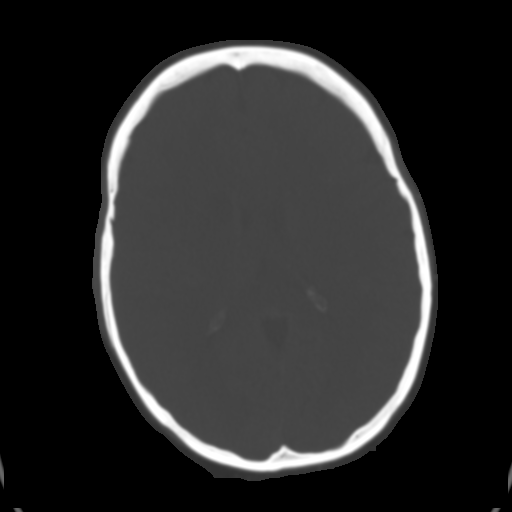
[im 20/33  brain]
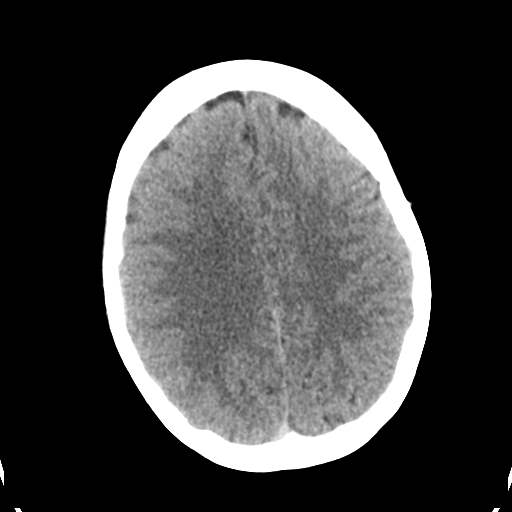
[im 24/33  brain]
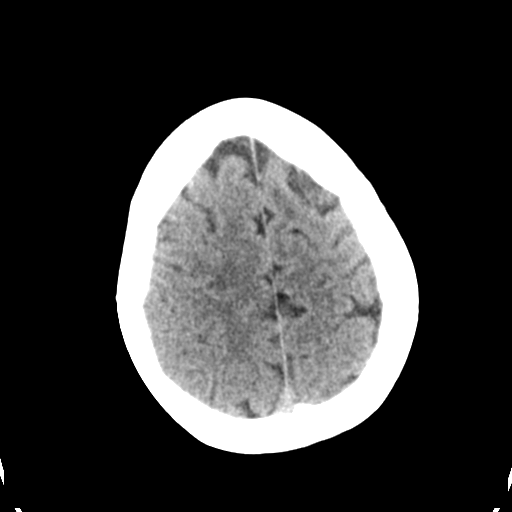
[im 27/33  brain]
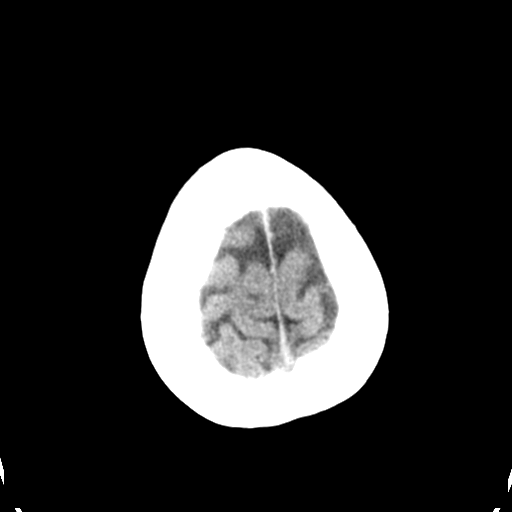
[im 30/33  brain]
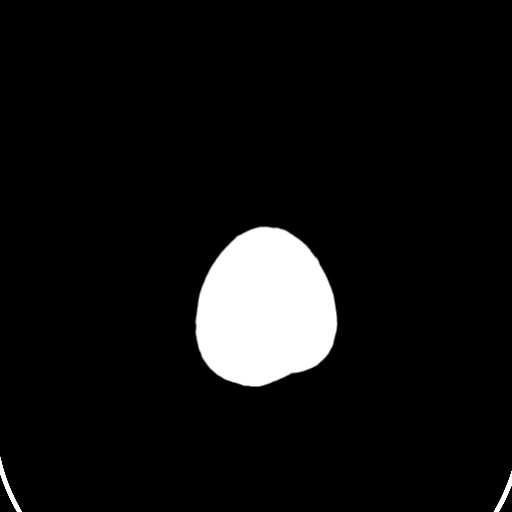
[im 30/33  bone]
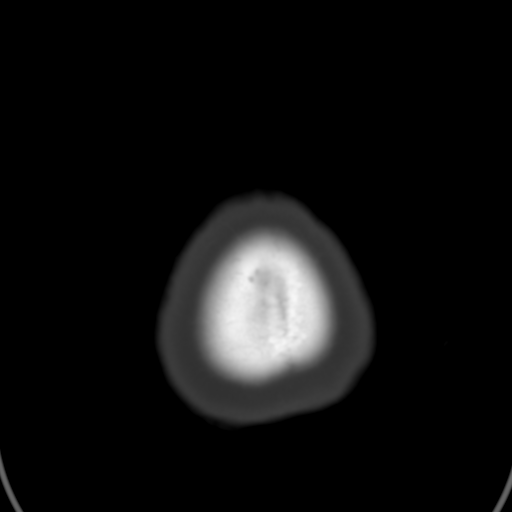

[Series 5: head 3.0 mpr cor · coronal · 0.32mm/px · 3 of 66 slices shown]
[im 22/66  brain]
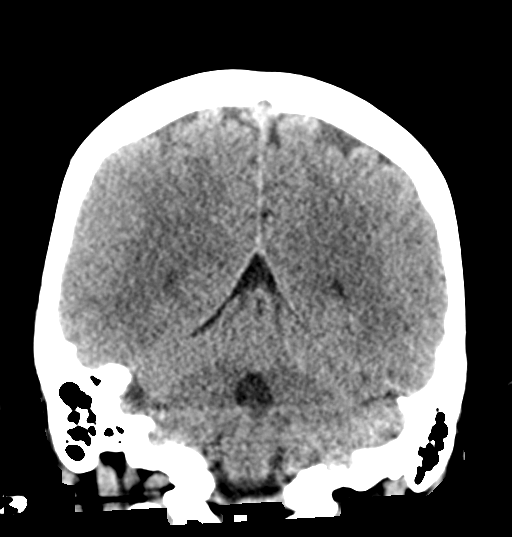
[im 29/66  brain]
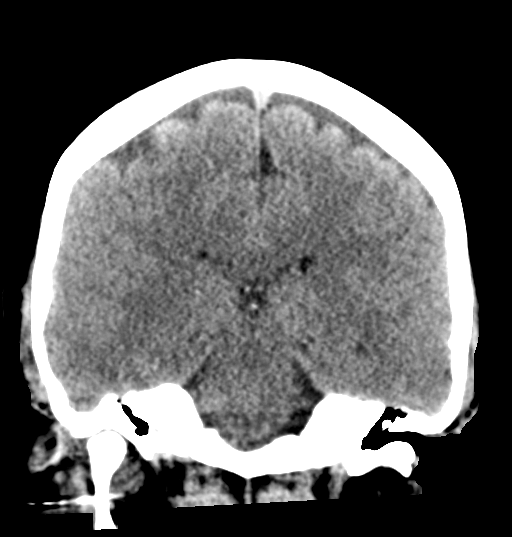
[im 37/66  brain]
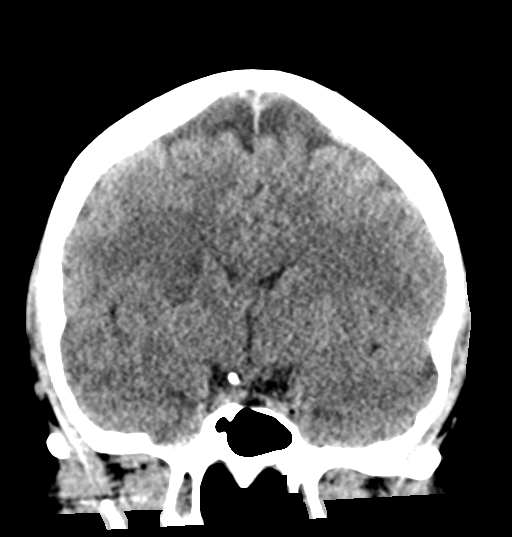

[Series 6: head 3.0 mpr sag · sagittal · 0.32mm/px · 3 of 56 slices shown]
[im 19/56  brain]
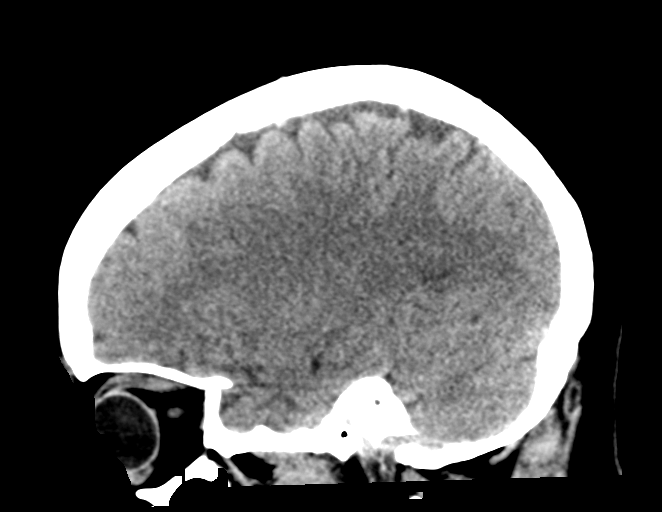
[im 28/56  brain]
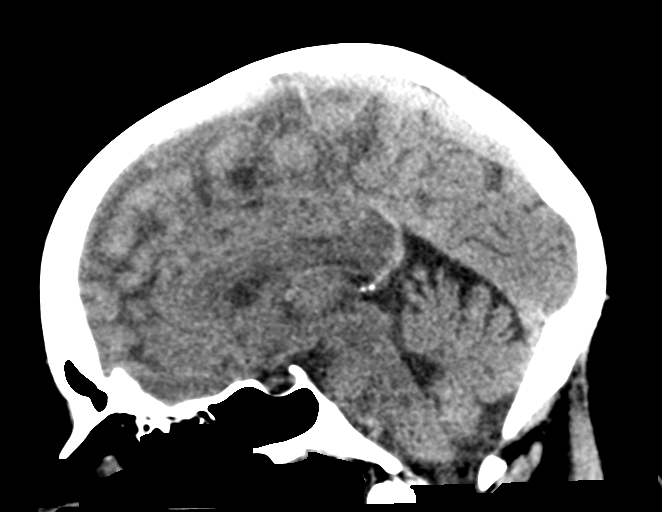
[im 37/56  brain]
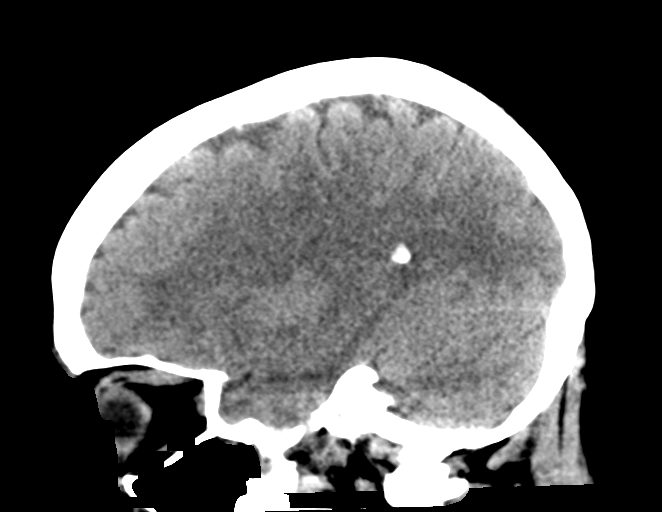

[15 of 47 positions shown; findings below may reference images not displayed]

FINDINGS: Brain: There is no acute intracranial hemorrhage. Hypoattenuation is
present involving the right caudate, adjacent white matter, and
superior lentiform nucleus. No significant mass effect. No
hydrocephalus. No extra-axial collection.

Vascular: No hyperdense vessel.

Skull: Unremarkable.

Sinuses/Orbits: No acute abnormality

Other: Mastoid air cells are clear.

ASPECTS (Alberta Stroke Program Early CT Score)

- Ganglionic level infarction (caudate, lentiform nuclei, internal
capsule, insula, M1-M3 cortex): 4

- Supraganglionic infarction (M4-M6 cortex): 3

Total score (0-10 with 10 being normal): 7
IMPRESSION: No acute intracranial hemorrhage. Acute infarction involving the
right caudate, adjacent white matter, and superior lentiform
nucleus. ASPECTS is 7.

These results were called by telephone at the time of interpretation
on [DATE] at [DATE] to provider Dr. TIGER, who verbally
acknowledged these results.

## 2020-11-13 IMAGING — XA IR PERCUTANEOUS ART THORMBECTOMY/INFUSION INTRACRANIAL INCLUDE D
1 series · 10 of 24 positions shown · IV contrast (IODINE)
Comparison: CT angiogram of the head and neck [DATE].

INDICATION: New onset right gaze deviation and left sided weakness arm greater
than leg. High-grade severe pre occlusive stenosis of the right
internal carotid artery petrous segment on CT angiogram of the head
and neck.

EXAM:
1. EMERGENT LARGE VESSEL OCCLUSION THROMBOLYSIS (anterior
CIRCULATION)
TECHNIQUE: Following a full explanation of the procedure along with the
potential associated complications, an informed witnessed consent
was obtained patient's mother. The risks of intracranial hemorrhage
of 10%, worsening neurological deficit, ventilator dependency, death
and inability to revascularize were all reviewed in detail with the
patient's mother.

[Series 300: dr. (person_name) · 10 of 257 slices shown]
[im 12/257]
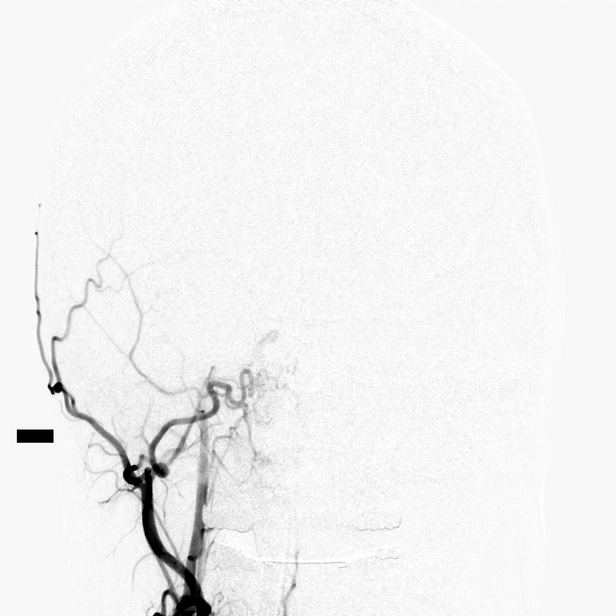
[im 34/257]
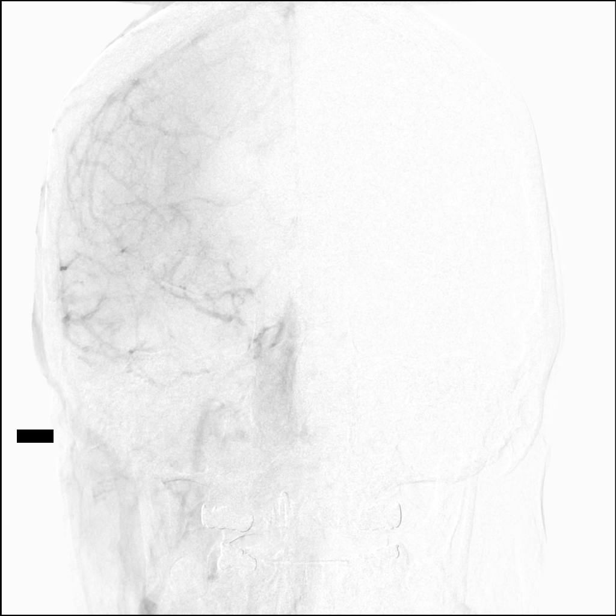
[im 67/257]
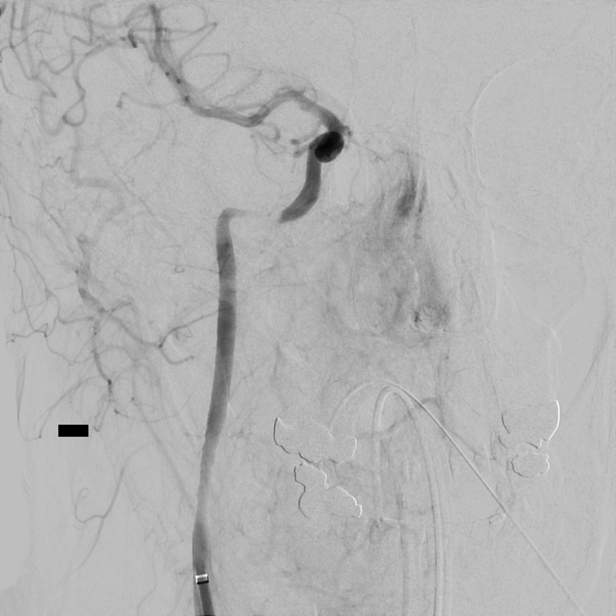
[im 90/257]
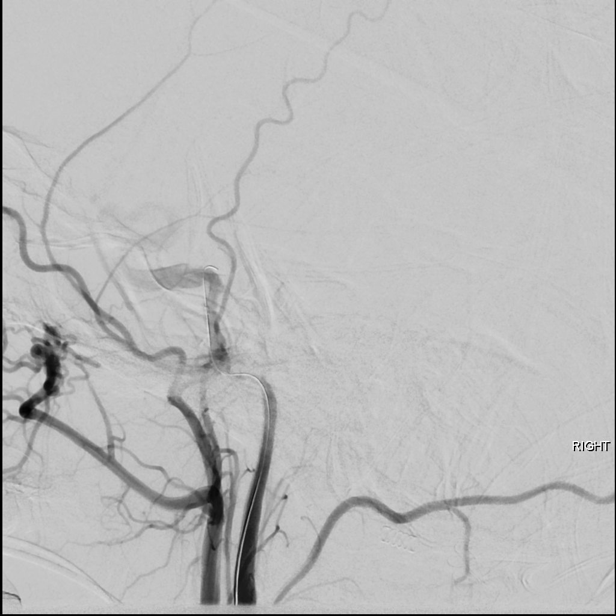
[im 112/257]
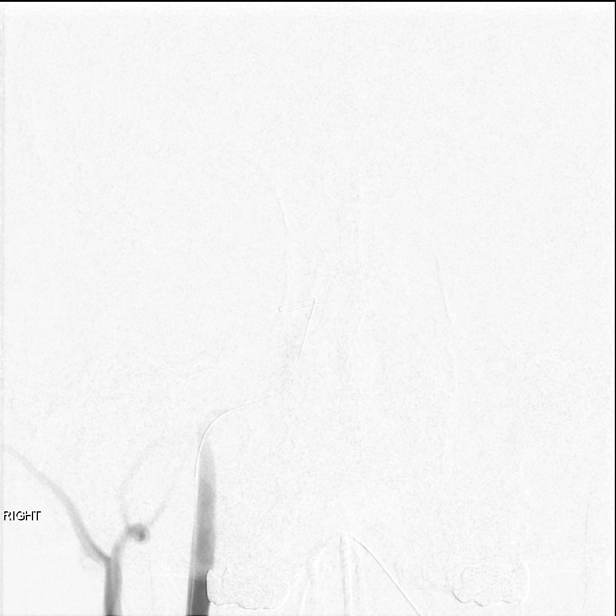
[im 145/257]
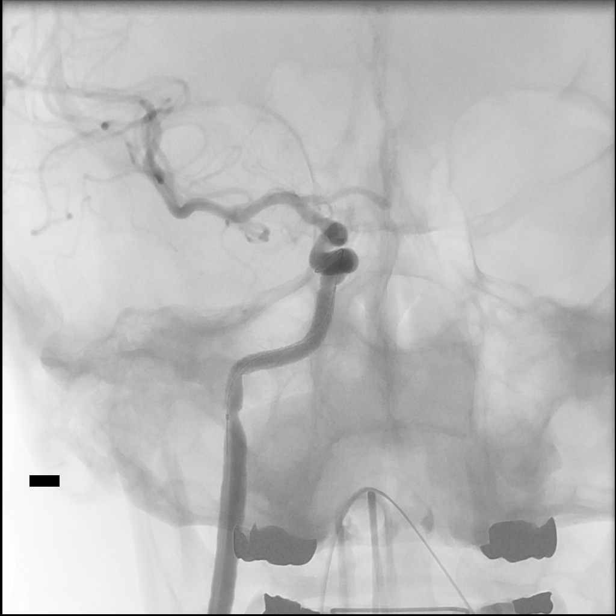
[im 167/257]
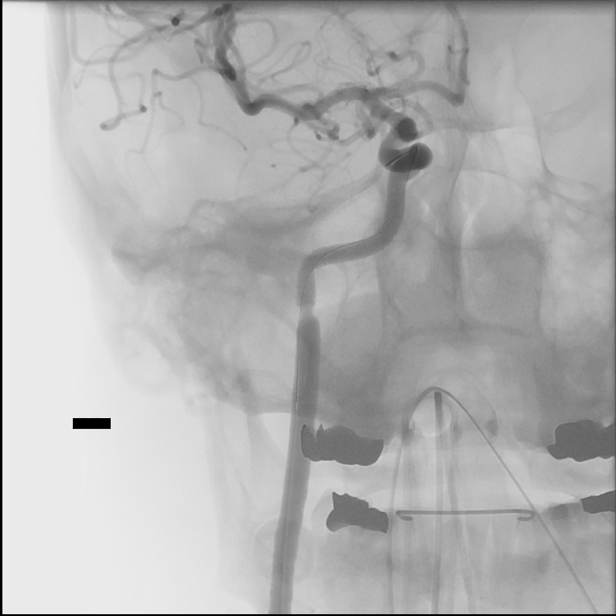
[im 201/257]
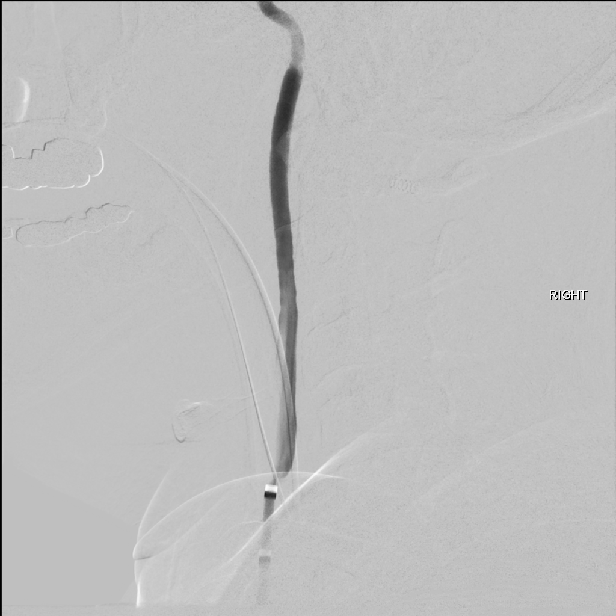
[im 223/257]
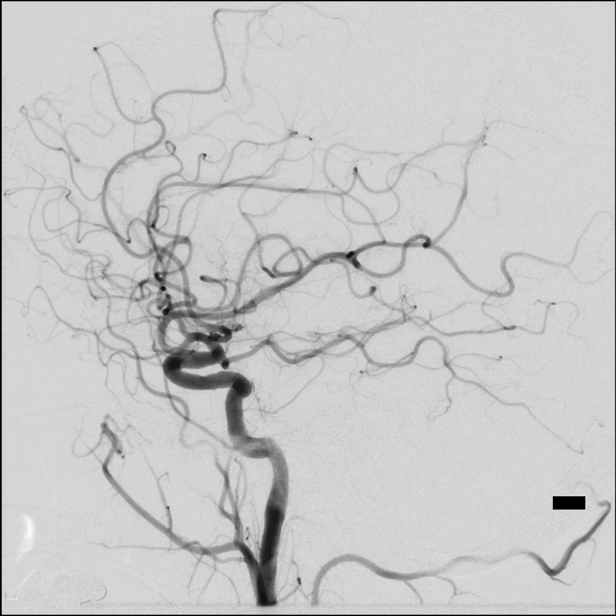
[im 245/257]
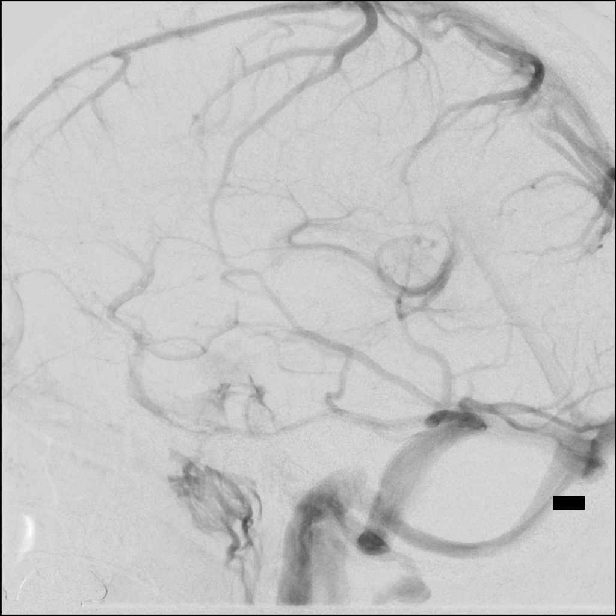

[10 of 24 positions shown; findings below may reference images not displayed]

MEDICATIONS:
Ancef 2 g IV antibiotic was administered within 1 hour of the
procedure.

ANESTHESIA/SEDATION:
General anesthesia

CONTRAST:  Isovue 300 approximately 100 mL

FLUOROSCOPY TIME:  Fluoroscopy Time: 21 minutes 18 seconds ([YQ]
mGy).

COMPLICATIONS:
None immediate.
The patient was then put under general anesthesia by the [REDACTED] at [HOSPITAL].

The right groin was prepped and draped in the usual sterile fashion.
Thereafter using modified Seldinger technique, transfemoral access
into the right common femoral artery was obtained without
difficulty. Over a 0.035 inch guidewire an 8 French 25 cm Pinnacle
sheath was inserted. Through this, and also over a 0.035 inch
guidewire a combination of a 5.5 BINDER support catheter
inside of an 087 balloon guide catheter was advanced to the aortic
arch region and selectively positioned in the right common carotid
artery. The guidewire and the support catheter were removed. Good
aspiration was obtained from the hub of the balloon guide catheter.
Gentle control arteriogram performed through this demonstrated the
right external carotid artery and its major branches to be widely
patent.

The right internal carotid artery at the bulb and its proximal half
appears widely patent.

There is a tapered severe narrowing leading up to pre occlusive
stenosis of the horizontal petrous segment of the right internal
carotid artery. Slow flow distal to this is noted into the petrous,
cavernous and supraclinoid segments.

The right middle cerebral artery opacifies into the capillary and
venous phases. Unopacified blood is seen in the middle cerebral
artery from the contralateral left ICA via the anterior
communicating artery.

No gross filling defects are seen in the right MCA branches.

Over a 0.014 inch Synchro standard micro guidewire with a J-tip
configuration, a 2.5 mm x 15 mm [REDACTED] angioplasty balloon catheter
which had been prepped with heparinized saline infusion, and flushed
with heparinized saline infusion was advanced to the distal cervical
right ICA. The micro guidewire was then gently manipulated without
much resistance to the petrous cervical junction. This was then
followed by the advancement of the [REDACTED] angioplasty microcatheter
such that the distal marker was distal to the stenosis and the
proximal marker was proximal to the stenosis.

Thereafter a control angioplasty was then performed using micro
inflation syringe device via micro tubing to 6 atmospheres achieving
a diameter of 2.5 mm where it was maintained for approximately 60
seconds. Balloon was then deflated and retrieved proximally. Control
arteriogram performed through balloon guide and the now proximal
right ICA demonstrated modest improvement in the caliber of the
angioplastied segment with free flow more distally into the right
middle cerebral artery distribution.

A second angioplasty was then performed again to 6 atmospheres where
it was maintained for approximately 4 seconds. Balloon was then
deflated and advanced more distally over the wire into the proximal
cavernous segment.

The guidewire was removed. [REDACTED] angioplasty balloon catheter was
then exchanged for an 014 inch support 300 cm exchange micro
guidewire. The micro guidewire had a J configuration to avoid
dissections or inducing spasm. Control arteriogram performed through
the balloon guide in the proximal right ICA demonstrated moderately
improved caliber of the angioplastied segment of the right internal
carotid artery petrous segment.

Measurements were then performed of the right internal carotid
artery and the most normal segment cervical petrous junction, and
the distal cervical right ICA.

It was decided to place a 3.5 mm x 34 mm Resolute Onyx stent. This
was then retrogradely prepped with heparinized saline infusion, and
with 50% contrast and 50% heparinized saline infusion.

Using the rapid exchange technique, stent delivery system was then
advanced without difficulty and positioned with the distal marker at
the mid petrous right ICA, and the proximal marker at the cervical
petrous junction.

Thereafter, stent was deployed by inflating the balloon using micro
inflation syringe device via micro tubing to approximately 3
atmospheres where it was maintained for approximately 25 seconds.
The balloon was then deflated and retrieved proximally. A control
arteriogram performed through the balloon guide in the right
internal carotid artery demonstrated excellent apposition of the
distal and the proximal portion of the stent with excellent caliber
with minimal stenosis of the angioplastied segment.

Moderate spasm was noted just distal to the stent which responded to
25 mcg of nitroglycerin intra-arterially.

For better apposition at the proximal end, the balloon was retrieved
proximally and a second inflation was performed again to 9
atmospheres which was maintained for approximately 20 seconds. The
balloon was deflated and retrieved under constant visualization.

Stent balloon was retrieved and removed with exchange guidewire in
the distal cavernous segment.

Control arteriograms were then performed at 15 and 30 minutes post
stent deployment. These continued to demonstrate excellent flow
through the stented segment and throughout the right internal
carotid artery extra cranially and intracranially.

Significant improved caliber and flow was noted in the right middle
cerebral and the now right anterior cerebral artery into the
capillary and venous phases with a TICI 3 revascularization.

Right posterior communicating artery was visualized with wide
patency also.

A final control arteriogram performed through the balloon guide in
the right common carotid artery following removal of the exchange
micro guidewire continued to demonstrate excellent flow through
right internal carotid artery extra cranially and intracranially
without evidence of intraluminal filling defects or of occlusions or
of spasm at the site of the stent, and also intracranially.

Balloon guide was then removed. Diagnostic catheter was then
introduced into the left common carotid artery.

Arteriogram performed demonstrated the left external carotid artery
and its major branches to be widely patent.

The left internal carotid artery at the bulb to the cranial skull
base also demonstrates wide patency with minimal arteriosclerotic
disease in the proximal cavernous segment.

Left posterior communicating artery was seen opacifying the left
posterior cerebral distribution.

The left middle cerebral artery and the left anterior cerebral
artery opacify into the capillary and venous phases. Partial
cross-filling via the anterior communicating artery of the right
anterior cerebral A1 segment was noted.

Incidental note made of the presence of a marginal sinus on the
right side communicating from the posterior aspect of the right
internal carotid artery bulb to the region of the right transverse
sinus proximal region/torcula junction, a developmental variation.

The patient was loaded with 81 mg of aspirin and 180 mg of Brilinta
via orogastric tube prior to the angioplasty. Right before stent
placement, patient was given a bolus dose of cangrelor IV followed
by 4 hour infusion with a CT to follow at that time. Balloon guide
was removed with successful hemostasis achieved at the right groin
puncture site with an 8 French Angio-Seal closure device.

The right groin appeared soft. Distal pulses remained Dopplerable in
both feet unchanged.

An immediate CT of the brain demonstrated no evidence of
intracranial hemorrhage, or mass effect or midline shift.

Patient was then extubated without difficulty. Upon recovery, the
patient was able to bend her left knee and follow simple commands
appropriately. She denied any headaches, nausea, vomiting or visual
changes. Minimal motor movement was evident in the left upper
extremity.

The patient was then transferred to the neuro ICU via the PACU.
FINDINGS: As above.

PROCEDURE:
As above.
IMPRESSION: Status post endovascular complete revascularization of symptomatic
near complete occlusive stenosis of the right internal carotid
artery petrous segment secondary to an atherosclerotic plaque with
balloon angioplasty followed by placement of intracranial stent with
TICI 3 revascularization of the right middle cerebral artery and the
right anterior cerebral artery distributions.

PLAN:
Follow-up in the clinic 2 to 3 weeks post discharge.

## 2020-11-13 SURGERY — IR WITH ANESTHESIA
Anesthesia: General

## 2020-11-13 MED ORDER — ASPIRIN 81 MG PO CHEW
CHEWABLE_TABLET | ORAL | Status: AC | PRN
  Administered 2020-11-13: 81 mg

## 2020-11-13 MED ORDER — ENOXAPARIN SODIUM 40 MG/0.4ML ~~LOC~~ SOLN
40.0000 mg | SUBCUTANEOUS | Status: DC
  Administered 2020-11-14 – 2020-11-18 (×5): 40 mg via SUBCUTANEOUS
  Filled 2020-11-13 (×5): qty 0.4

## 2020-11-13 MED ORDER — SODIUM CHLORIDE 0.9 % IV SOLN: INTRAVENOUS | Status: DC

## 2020-11-13 MED ORDER — ASPIRIN 81 MG PO CHEW
81.0000 mg | CHEWABLE_TABLET | Freq: Every day | ORAL | Status: DC
  Administered 2020-11-14 – 2020-11-18 (×5): 81 mg via ORAL
  Filled 2020-11-13 (×5): qty 1

## 2020-11-13 MED ORDER — ACETAMINOPHEN 325 MG PO TABS: 650.0000 mg | ORAL_TABLET | ORAL | Status: DC | PRN

## 2020-11-13 MED ORDER — IOHEXOL 300 MG/ML  SOLN
150.0000 mL | Freq: Once | INTRAMUSCULAR | Status: AC | PRN
  Administered 2020-11-13: 75 mL via INTRA_ARTERIAL

## 2020-11-13 MED ORDER — ROCURONIUM BROMIDE 10 MG/ML (PF) SYRINGE
PREFILLED_SYRINGE | INTRAVENOUS | Status: DC | PRN
  Administered 2020-11-13: 100 mg via INTRAVENOUS

## 2020-11-13 MED ORDER — LIDOCAINE 2% (20 MG/ML) 5 ML SYRINGE
INTRAMUSCULAR | Status: DC | PRN
  Administered 2020-11-13: 60 mg via INTRAVENOUS

## 2020-11-13 MED ORDER — TICAGRELOR 90 MG PO TABS
90.0000 mg | ORAL_TABLET | Freq: Two times a day (BID) | ORAL | Status: DC
  Filled 2020-11-13 (×4): qty 1

## 2020-11-13 MED ORDER — PHENYLEPHRINE HCL-NACL 10-0.9 MG/250ML-% IV SOLN
INTRAVENOUS | Status: DC | PRN
  Administered 2020-11-13: 15 ug/min via INTRAVENOUS

## 2020-11-13 MED ORDER — VERAPAMIL HCL 2.5 MG/ML IV SOLN
INTRAVENOUS | Status: AC
  Filled 2020-11-13: qty 2

## 2020-11-13 MED ORDER — CANGRELOR TETRASODIUM 50 MG IV SOLR
INTRAVENOUS | Status: AC
  Filled 2020-11-13: qty 50

## 2020-11-13 MED ORDER — SUGAMMADEX SODIUM 200 MG/2ML IV SOLN
INTRAVENOUS | Status: DC | PRN
  Administered 2020-11-13: 200 mg via INTRAVENOUS

## 2020-11-13 MED ORDER — ACETAMINOPHEN 650 MG RE SUPP: 650.0000 mg | RECTAL | Status: DC | PRN

## 2020-11-13 MED ORDER — ACETAMINOPHEN 160 MG/5ML PO SOLN
650.0000 mg | ORAL | Status: DC | PRN
Start: 2020-11-13 — End: 2020-11-13

## 2020-11-13 MED ORDER — TICAGRELOR 90 MG PO TABS
90.0000 mg | ORAL_TABLET | Freq: Two times a day (BID) | ORAL | Status: DC
  Administered 2020-11-13 – 2020-11-18 (×10): 90 mg via ORAL
  Filled 2020-11-13 (×8): qty 1

## 2020-11-13 MED ORDER — IOHEXOL 240 MG/ML SOLN
INTRAMUSCULAR | Status: AC
  Filled 2020-11-13: qty 200

## 2020-11-13 MED ORDER — SODIUM CHLORIDE 0.9 % IV SOLN: INTRAVENOUS | Status: DC | PRN

## 2020-11-13 MED ORDER — IOHEXOL 300 MG/ML  SOLN
50.0000 mL | Freq: Once | INTRAMUSCULAR | Status: AC | PRN
  Administered 2020-11-13: 10 mL via INTRA_ARTERIAL

## 2020-11-13 MED ORDER — ASPIRIN 81 MG PO CHEW
CHEWABLE_TABLET | ORAL | Status: AC
  Filled 2020-11-13: qty 1

## 2020-11-13 MED ORDER — FENTANYL CITRATE (PF) 100 MCG/2ML IJ SOLN: 25.0000 ug | INTRAMUSCULAR | Status: DC | PRN

## 2020-11-13 MED ORDER — NITROGLYCERIN 1 MG/10 ML FOR IR/CATH LAB
INTRA_ARTERIAL | Status: AC | PRN
  Administered 2020-11-13 (×3): 25 ug via INTRA_ARTERIAL

## 2020-11-13 MED ORDER — CANGRELOR BOLUS VIA INFUSION
INTRAVENOUS | Status: AC | PRN
  Administered 2020-11-13: 1224 ug via INTRAVENOUS

## 2020-11-13 MED ORDER — IOHEXOL 300 MG/ML  SOLN
50.0000 mL | Freq: Once | INTRAMUSCULAR | Status: AC | PRN
  Administered 2020-11-13: 25 mL via INTRA_ARTERIAL

## 2020-11-13 MED ORDER — SUCCINYLCHOLINE CHLORIDE 200 MG/10ML IV SOSY
PREFILLED_SYRINGE | INTRAVENOUS | Status: DC | PRN
  Administered 2020-11-13: 90 mg via INTRAVENOUS

## 2020-11-13 MED ORDER — SODIUM CHLORIDE 0.9 % IV SOLN
INTRAVENOUS | Status: AC | PRN
  Administered 2020-11-13: 2 ug/kg/min via INTRAVENOUS

## 2020-11-13 MED ORDER — PROPOFOL 10 MG/ML IV BOLUS
INTRAVENOUS | Status: DC | PRN
  Administered 2020-11-13: 200 mg via INTRAVENOUS

## 2020-11-13 MED ORDER — ACETAMINOPHEN 160 MG/5ML PO SOLN
650.0000 mg | ORAL | Status: DC | PRN
  Administered 2020-11-14: 650 mg
  Filled 2020-11-13: qty 20.3

## 2020-11-13 MED ORDER — TICAGRELOR 90 MG PO TABS
ORAL_TABLET | ORAL | Status: AC
  Filled 2020-11-13: qty 2

## 2020-11-13 MED ORDER — SODIUM CHLORIDE 0.9% FLUSH: 3.0000 mL | Freq: Once | INTRAVENOUS | Status: DC

## 2020-11-13 MED ORDER — TICAGRELOR 60 MG PO TABS
ORAL_TABLET | ORAL | Status: AC | PRN
  Administered 2020-11-13: 180 mg

## 2020-11-13 MED ORDER — IOHEXOL 350 MG/ML SOLN
100.0000 mL | Freq: Once | INTRAVENOUS | Status: AC | PRN
  Administered 2020-11-13: 100 mL via INTRAVENOUS

## 2020-11-13 MED ORDER — FENTANYL CITRATE (PF) 100 MCG/2ML IJ SOLN
INTRAMUSCULAR | Status: AC
  Filled 2020-11-13: qty 2

## 2020-11-13 MED ORDER — ONDANSETRON HCL 4 MG/2ML IJ SOLN
INTRAMUSCULAR | Status: DC | PRN
  Administered 2020-11-13: 4 mg via INTRAVENOUS

## 2020-11-13 MED ORDER — STROKE: EARLY STAGES OF RECOVERY BOOK
Freq: Once | Status: AC
  Filled 2020-11-13: qty 1

## 2020-11-13 MED ORDER — EPTIFIBATIDE 20 MG/10ML IV SOLN
INTRAVENOUS | Status: AC
  Filled 2020-11-13: qty 10

## 2020-11-13 MED ORDER — LABETALOL HCL 5 MG/ML IV SOLN
INTRAVENOUS | Status: DC | PRN
  Administered 2020-11-13 (×2): 2.5 mg via INTRAVENOUS

## 2020-11-13 MED ORDER — NITROGLYCERIN 1 MG/10 ML FOR IR/CATH LAB
INTRA_ARTERIAL | Status: AC
  Filled 2020-11-13: qty 10

## 2020-11-13 MED ORDER — CEFAZOLIN SODIUM-DEXTROSE 2-3 GM-%(50ML) IV SOLR
INTRAVENOUS | Status: DC | PRN
  Administered 2020-11-13: 2 g via INTRAVENOUS

## 2020-11-13 MED ORDER — FENTANYL CITRATE (PF) 250 MCG/5ML IJ SOLN
INTRAMUSCULAR | Status: DC | PRN
  Administered 2020-11-13 (×3): 50 ug via INTRAVENOUS

## 2020-11-13 MED ORDER — TIROFIBAN HCL IN NACL 5-0.9 MG/100ML-% IV SOLN
INTRAVENOUS | Status: AC
  Filled 2020-11-13: qty 100

## 2020-11-13 MED ORDER — DEXAMETHASONE SODIUM PHOSPHATE 10 MG/ML IJ SOLN
INTRAMUSCULAR | Status: DC | PRN
  Administered 2020-11-13: 5 mg via INTRAVENOUS

## 2020-11-13 MED ORDER — SENNOSIDES-DOCUSATE SODIUM 8.6-50 MG PO TABS: 1.0000 | ORAL_TABLET | Freq: Every evening | ORAL | Status: DC | PRN

## 2020-11-13 MED ORDER — CLEVIDIPINE BUTYRATE 0.5 MG/ML IV EMUL
0.0000 mg/h | INTRAVENOUS | Status: DC
  Administered 2020-11-13: 1 mg/h via INTRAVENOUS
  Administered 2020-11-13: 4 mg/h via INTRAVENOUS
  Filled 2020-11-13 (×2): qty 50

## 2020-11-13 MED ORDER — CEFAZOLIN SODIUM-DEXTROSE 2-4 GM/100ML-% IV SOLN
INTRAVENOUS | Status: AC
  Filled 2020-11-13: qty 100

## 2020-11-13 MED ORDER — PANTOPRAZOLE SODIUM 40 MG IV SOLR
40.0000 mg | INTRAVENOUS | Status: DC
  Administered 2020-11-14: 40 mg via INTRAVENOUS
  Filled 2020-11-13: qty 40

## 2020-11-13 MED ORDER — ASPIRIN 81 MG PO CHEW: 81.0000 mg | CHEWABLE_TABLET | Freq: Every day | ORAL | Status: DC

## 2020-11-13 MED ORDER — ONDANSETRON HCL 4 MG/2ML IJ SOLN: 4.0000 mg | Freq: Once | INTRAMUSCULAR | Status: DC | PRN

## 2020-11-13 MED ORDER — FENTANYL CITRATE (PF) 250 MCG/5ML IJ SOLN
INTRAMUSCULAR | Status: AC
  Filled 2020-11-13: qty 5

## 2020-11-13 MED ORDER — ACETAMINOPHEN 325 MG PO TABS
650.0000 mg | ORAL_TABLET | ORAL | Status: DC | PRN
  Administered 2020-11-13 – 2020-11-15 (×2): 650 mg via ORAL
  Filled 2020-11-13 (×2): qty 2

## 2020-11-13 MED ORDER — CLOPIDOGREL BISULFATE 300 MG PO TABS
ORAL_TABLET | ORAL | Status: AC
  Filled 2020-11-13: qty 1

## 2020-11-13 NOTE — Sedation Documentation (Signed)
Right femoral arterial sheath removed, 59fr angioseal deployed to right femoral site.

## 2020-11-13 NOTE — Progress Notes (Signed)
Patient ID: Dana Henry, female   DOB: September 17, 1981, 40 y.o.   MRN: [id number] INR. 36 Y RT H F [facility]RS 0 LSW ? New onset Lt sided weakness and A.L with RT gaze deviation. CT brain subacute RT putamen and caudate head  sugacute ischemic chnages. ASpects 7 CTA near occlusion RT ICA petoues seg. CTP large RT cerebral hemishere penumbra . No core. Endovascular revascularization of RT ICA intracranially D/W mother and husband. Procedure,reasons,alternatives reviewed. Risks of ICH of 10 % ,worsening neuro deficit ,death and inability to revascularize reviewed. .Both expressed understanding,and mother provided consent for  the treatment. S.Hara Milholland MD

## 2020-11-13 NOTE — Progress Notes (Signed)
NIR  History of acute CVA s/p cerebral arteriogram with emergent stent assisted angioplasty of right ICA petrous stenosis achieving a TICI 3 revascularization via right femoral approach today by Dr. Estanislado Pandy.  Patient evaluated in PACU alongside Dr. Estanislado Pandy following procedure. Patient awake and alert laying in bed. Complains of fatigue and sore throat. Denies headache, vision changes, N/V.  Alert, awake, and oriented x3. Speech and comprehension intact. PERRL bilaterally. EOMs intact bilaterally without nystagmus or subjective diplopia. Left facial droop. Tongue midline. Can spontaneously move right side and LLE (bend knee, raise off bed) but no spontaneous movements of LUE. Distal pulses (DPs) 1+ bilaterally. Right femoral puncture site soft without active bleeding or hematoma.  Plan to transfer to neuro ICU from PACU. HOB flat until 0700. Continue taking Brilinta 90 mg twice daily and Aspirin 81 mg once daily. Advance diet as tolerated (will need swallowing evaluation). Further plans per neurology- appreciate and agree with management. NIR to follow.   Dana Graff Tory Mckissack, PA-C 11/13/2020, 3:32 PM

## 2020-11-13 NOTE — ED Notes (Signed)
Report given to IR Nurse

## 2020-11-13 NOTE — Transfer of Care (Signed)
Immediate Anesthesia Transfer of Care Note  Patient: Dana Henry  Procedure(s) Performed: IR WITH ANESTHESIA (N/A )  Patient Location: PACU  Anesthesia Type:General  Level of Consciousness: drowsy and patient cooperative  Airway & Oxygen Therapy: Patient Spontanous Breathing  Post-op Assessment: Report given to RN and Post -op Vital signs reviewed and stable  Post vital signs: Reviewed and stable  Last Vitals:  Vitals Value Taken Time  BP 100/54 11/13/20 1359  Temp    Pulse 92 11/13/20 1406  Resp 18 11/13/20 1406  SpO2 98 % 11/13/20 1406  Vitals shown include unvalidated device data.  Last Pain:  Vitals:   11/13/20 1054  PainSc: 5          Complications: No complications documented.

## 2020-11-13 NOTE — Anesthesia Postprocedure Evaluation (Signed)
Anesthesia Post Note  Patient: Dana Henry  Procedure(s) Performed: IR WITH ANESTHESIA (N/A )     Patient location during evaluation: PACU Anesthesia Type: General Level of consciousness: awake and alert, oriented and patient cooperative Pain management: pain level controlled Vital Signs Assessment: post-procedure vital signs reviewed and stable Respiratory status: spontaneous breathing, nonlabored ventilation and respiratory function stable Cardiovascular status: blood pressure returned to baseline and stable Postop Assessment: no apparent nausea or vomiting Anesthetic complications: no   No complications documented.  Last Vitals:  Vitals:   11/13/20 1100 11/13/20 1400  BP: 120/71   Pulse: 80   Resp: 17   Temp:  (!) 36.2 C  SpO2:      Last Pain:  Vitals:   11/13/20 1430  PainSc: 0-No pain                 Pervis Hocking

## 2020-11-13 NOTE — ED Triage Notes (Signed)
Pt here as a code stroke lsn at 0930 , pt with left sided weakness and left side facial droop and right side gaze

## 2020-11-13 NOTE — Anesthesia Procedure Notes (Addendum)
Arterial Line Insertion Start/End1/26/2022 11:30 AM, 11/13/2020 11:36 AM Performed by: Pervis Hocking, DO, anesthesiologist  Patient location: OOR procedure area. Preanesthetic checklist: patient identified, IV checked, site marked, risks and benefits discussed, surgical consent, monitors and equipment checked, pre-op evaluation, timeout performed and anesthesia consent Lidocaine 1% used for infiltration Left, radial was placed Catheter size: 20 Fr Hand hygiene performed  and maximum sterile barriers used   Attempts: 1 Procedure performed without using ultrasound guided technique. Following insertion, dressing applied and Biopatch. Post procedure assessment: normal and unchanged  Patient tolerated the procedure well with no immediate complications.

## 2020-11-13 NOTE — Anesthesia Preprocedure Evaluation (Signed)
Anesthesia Evaluation  Patient identified by MRN, date of birth, ID band Patient awake    Reviewed: H&P Preop documentation limited or incomplete due to emergent nature of procedure.  Airway Mallampati: II  TM Distance: >3 FB Neck ROM: Full    Dental no notable dental hx. (+) Teeth Intact, Dental Advisory Given   Pulmonary neg pulmonary ROS, former smoker,    Pulmonary exam normal breath sounds clear to auscultation       Cardiovascular negative cardio ROS Normal cardiovascular exam Rhythm:Regular Rate:Normal     Neuro/Psych  Headaches, Code stroke, significant rightward gaze defect CVA, Residual Symptoms negative psych ROS   GI/Hepatic negative GI ROS, Neg liver ROS,   Endo/Other  negative endocrine ROS  Renal/GU negative Renal ROS  negative genitourinary   Musculoskeletal negative musculoskeletal ROS (+)   Abdominal   Peds negative pediatric ROS (+)  Hematology negative hematology ROS (+)   Anesthesia Other Findings   Reproductive/Obstetrics negative OB ROS                             Anesthesia Physical Anesthesia Plan  ASA: III and emergent  Anesthesia Plan: General   Post-op Pain Management:    Induction: Intravenous, Rapid sequence and Cricoid pressure planned  PONV Risk Score and Plan: Ondansetron and Treatment may vary due to age or medical condition  Airway Management Planned: Oral ETT  Additional Equipment: None  Intra-op Plan:   Post-operative Plan: Extubation in OR and Possible Post-op intubation/ventilation  Informed Consent: I have reviewed the patients History and Physical, chart, labs and discussed the procedure including the risks, benefits and alternatives for the proposed anesthesia with the patient or authorized representative who has indicated his/her understanding and acceptance.     Dental advisory given  Plan Discussed with: CRNA  Anesthesia  Plan Comments: (D/w pts mother)        Anesthesia Quick Evaluation

## 2020-11-13 NOTE — Anesthesia Procedure Notes (Addendum)
Procedure Name: Intubation Date/Time: 11/13/2020 11:27 AM Performed by: Darletta Moll, CRNA Pre-anesthesia Checklist: Patient identified, Emergency Drugs available, Suction available and Patient being monitored Patient Re-evaluated:Patient Re-evaluated prior to induction Oxygen Delivery Method: Circle system utilized Preoxygenation: Pre-oxygenation with 100% oxygen Induction Type: IV induction and Rapid sequence Laryngoscope Size: Glidescope and 3 Grade View: Grade I Tube type: Oral Tube size: 7.5 mm Number of attempts: 1 Airway Equipment and Method: Video-laryngoscopy and Rigid stylet Placement Confirmation: ETT inserted through vocal cords under direct vision,  positive ETCO2 and breath sounds checked- equal and bilateral Secured at: 21 cm Tube secured with: Tape Dental Injury: Teeth and Oropharynx as per pre-operative assessment

## 2020-11-13 NOTE — Procedures (Signed)
S/P bilateral common carotid arteriograms followed by complete revascularization of severe symptomatic RT ICA petrous seg stenosis with balloon angioplasty and stenting. TICI 3 RT MCA and RT ACA distributions. Post CT No ICH . RT CFA hemostasis with 63F angioseal closure device. Distal pulses all present. Extubated..  Moves RT A and L normally. Attempts to bend Lt Knee.  Follows simple commands appropriately. S.Emmani Lesueur MD

## 2020-11-13 NOTE — Code Documentation (Signed)
Stroke Response Nurse Documentation Code Documentation  Jamye Balicki is a 40 y.o. female arriving to [place] ED via [facility] on 11/13/2020 with no significant past medical hx. Code stroke was activated by EMS after patient was noted to have new onset of left facial droop, right gaze, worsening left arm weakness and left leg weakness this morning at 0915. Patient from home and reports that she noted some mild arm numbness and weakness on Sunday, but was at the beach and did not get seen. The slight weakness persisted Monday and through yesterday.   Yesterday, she spiked a 103.5 fever and felt achy all over so she went to [facility] to be seen. While there, patient reported having numbness and tingling to the left arm. She was not able to be seen by a provider due to the long wait and went home. This morning at 0915, she had a sudden onset of left facial droop, right gaze, worsening left arm weakness, and left leg weakness. EMS was called and Code Stroke activated.  Stroke team at the bedside on patient arrival. Labs drawn and patient cleared for CT by EDP. Patient to CT with team. NIHSS 10, see documentation for details and code stroke times. Patient with right gaze preference , left facial droop, left arm weakness, left leg weakness and Visual  neglect on exam. The following imaging was completed: CT, CTA head and neck, CTP. Patient is not a candidate for tPA due to being outside window. Decision made to take patient to IR after review of risk with patient and family. Bedside handoff with IR RN Mingo Amber.    Kathrin Greathouse  Stroke Response RN

## 2020-11-13 NOTE — H&P (Signed)
Stroke Neurology H&P Note  The history was obtained from the pt and mom.  During history and examination, all items were able to obtain unless otherwise noted.  History of Present Illness:  Dana Henry is a 40 y.o. Caucasian female with PMH of migraine, COVID infection 09/2020 presented to ED for code stroke.  Pt pt, she started to have left sided numbness and weakness since Sunday. She was in [place] and she started to have left arm and leg numbness and left arm weaker than leg. She was able to walk and her husband was having cough and fever so she only told her husband and not anybody else. Her husband was tested COVID negative and gradually getting better. Pt symptoms did not improve or worsen. Yesterday she had fever 102 and went to [facility], per mom at that time, she still had difficulty with left arm weakness, only can hold halfway. Pt stated that her left arm and leg numbness and weakness remained the same. This morning around 9:15am, mom found pt to have right gaze and left facial droop and left sided weakness getting worse. EMS called and pt sent to [facility] for evaluation. 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 CTA head and neck showed right ICA petrous segment occlusion vs. High grade stenosis. CTP large penumbra on the right. Discussed with pt and family, they are in agreement for possible thrombectomy.   Pt not on medication at home. But per mom, pt has a lot of stress lately.   LSN: Sunday tPA Given: No: outside window mRS = 0   Past Medical History:  Diagnosis Date  . Anemia   . History of cesarean delivery affecting pregnancy 08/10/2017  . Kidney stones    surgically removed at 40YO and 40YO  . Migraines    associated with menstrual cycle  . UTI (urinary tract infection)   . Vaginal Pap smear, abnormal     Past Surgical History:  Procedure Laterality Date  . CESAREAN SECTION N/A 07/21/2014   Procedure: CESAREAN SECTION;  Surgeon: Elveria Royals, MD;   Location: [facility];  Service: Obstetrics;  Laterality: N/A;  . CESAREAN SECTION N/A 08/10/2017   Procedure: Repeat CESAREAN SECTION;  Surgeon: Azucena Fallen, MD;  Location: [place];  Service: Obstetrics;  Laterality: N/A;  EDD: 08/17/17 Allergy: Sulfa  . KIDNEY STONE SURGERY    . TONSILLECTOMY AND ADENOIDECTOMY  1991    Family History  Problem Relation Age of Onset  . Hypertension Mother   . Alcohol abuse Father     Social History:  reports that she quit smoking about 7 years ago. Her smoking use included cigarettes. She smoked 0.50 packs per day. She has never used smokeless tobacco. She reports current alcohol use. She reports that she does not use drugs.  Allergies:  Allergies  Allergen Reactions  . Sulfa Antibiotics Hives and Swelling    No current facility-administered medications on file prior to encounter.   No current outpatient medications on file prior to encounter.    Review of Systems: A full ROS was attempted today and was able to be performed.  Systems assessed include - Constitutional, Eyes, HENT, Respiratory, Cardiovascular, Gastrointestinal, Genitourinary, Integument/breast, Hematologic/lymphatic, Musculoskeletal, Neurological, Behavioral/Psych, Endocrine, Allergic/Immunologic - with pertinent responses as per HPI.  Physical Examination: Temp:  [99.1 F (37.3 C)-99.2 F (37.3 C)] 99.2 F (37.3 C) (01/26 1059) Pulse Rate:  [80-90] 80 (01/26 1100) Resp:  [17-20] 17 (01/26 1100) BP: (120-132)/(68-72) 120/71 (01/26 1100) SpO2:  [  98 %-100 %] 98 % (01/26 1059) Weight:  [81.6 kg] 81.6 kg (01/25 1854)  General - well nourished, well developed, in no apparent distress.    Ophthalmologic - fundi not visualized due to noncooperation.    Cardiovascular - regular rhythm and rate  Mental Status -  Level of arousal and orientation to time, place, and person were intact. Language including expression, naming, repetition, comprehension was assessed and found  intact. Right neglect  Cranial Nerves II - XII - II - Vision field concerning for left HH vs. Visual neglect. III, IV, VI - Extraocular movements showed right gaze preference but able to have incomplete left gaze. V - Facial sensation decreased to the left. VII - Facial movement showed left facial droop VIII - Hearing & vestibular intact bilaterally. X - Palate elevates symmetrically. XI - Chin turning & shoulder shrug intact bilaterally. XII - Tongue protrusion intact.  Motor Strength - The patient's strength was normal in right upper and lower extremities and left UE not against gravity and left LE drift but not hit bed in 5 sec.   Motor Tone & Bulk - Muscle tone was assessed at the neck and appendages and was normal.  Bulk was normal and fasciculations were absent.   Reflexes - The patient's reflexes were normal in all extremities and she had no pathological reflexes.  Sensory - Light touch, temperature/pinprick were assessed and were diminished on the left.    Coordination - The patient had normal movements in the right hands with no ataxia or dysmetria.  Tremor was absent.  Gait and Station - deferred  NIH Stroke Scale  Level Of Consciousness 0=Alert; keenly responsive 1=Arouse to minor stimulation 2=Requires repeated stimulation to arouse or movements to pain 3=postures or unresponsive 0  LOC Questions to Month and Age 92=Answers both questions correctly 1=Answers one question correctly or dysarthria/intubated/trauma/language barrier 2=Answers neither question correctly or aphasia 0  LOC Commands      -Open/Close eyes     -Open/close grip     -Pantomime commands if communication barrier 0=Performs both tasks correctly 1=Performs one task correctly 2=Performs neighter task correctly 0  Best Gaze     -Only assess horizontal gaze 0=Normal 1=Partial gaze palsy 2=Forced deviation, or total gaze paresis 1  Visual 0=No visual loss 1=Partial hemianopia 2=Complete  hemianopia 3=Bilateral hemianopia (blind including cortical blindness) 2  Facial Palsy     -Use grimace if obtunded 0=Normal symmetrical movement 1=Minor paralysis (asymmetry) 2=Partial paralysis (lower face) 3=Complete paralysis (upper and lower face) 2  Motor  0=No drift for 10/5 seconds 1=Drift, but does not hit bed 2=Some antigravity effort, hits  bed 3=No effort against gravity, limb falls 4=No movement 0=Amputation/joint fusion Right Arm 0     Leg 0    Left Arm 4     Leg 1  Limb Ataxia     - FNT/HTS 0=Absent or does not understand or paralyzed or amputation/joint fusion 1=Present in one limb 2=Present in two limbs 0  Sensory 0=Normal 1=Mild to moderate sensory loss 2=Severe to total sensory loss or coma/unresponsive 2  Best Language 0=No aphasia, normal 1=Mild to moderate aphasia 2=Severe aphasia 3=Mute, global aphasia, or coma/unresponsive 0  Dysarthria 0=Normal 1=Mild to moderate 2=Severe, unintelligible or mute/anarthric 0=intubated/unable to test 0  Extinction/Neglect 0=No abnormality 1=visual/tactile/auditory/spatia/personal inattention/Extinction to bilateral simultaneous stimulation 2=Profound neglect/extinction more than 1 modality  2  Total   14     Data Reviewed: CT ANGIO HEAD W OR WO CONTRAST  Addendum Date: 11/13/2020  ADDENDUM REPORT: 11/13/2020 11:16 ADDENDUM: Transcription error in the impression. Should state occlusion or high-grade stenosis right petrous ICA. Electronically Signed   By: [name] M.D.   On: 11/13/2020 11:16   Result Date: 11/13/2020 CLINICAL DATA:  Code stroke follow-up, noncontrast CT positive for acute stroke EXAM: CT ANGIOGRAPHY HEAD AND NECK CT PERFUSION BRAIN TECHNIQUE: Multidetector CT imaging of the head and neck was performed using the standard protocol during bolus administration of intravenous contrast. Multiplanar CT image reconstructions and MIPs were obtained to evaluate the vascular anatomy. Carotid stenosis  measurements (when applicable) are obtained utilizing NASCET criteria, using the distal internal carotid diameter as the denominator. Multiphase CT imaging of the brain was performed following IV bolus contrast injection. Subsequent parametric perfusion maps were calculated using RAPID software. CONTRAST:  133mL OMNIPAQUE IOHEXOL 350 MG/ML SOLN COMPARISON:  None. FINDINGS: CTA NECK FINDINGS Aortic arch: Great vessel origins are patent. 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 Right carotid system: Patent. No measurable stenosis at the ICA origin. Left carotid system: Patent. No measurable stenosis at the ICA origin Vertebral arteries: Patent. Left vertebral artery is dominant. No measurable stenosis or evidence of dissection. Skeleton: No significant osseous abnormality Other neck: No mass or adenopathy. Upper chest: No apical lung mass. Review of the MIP images confirms the above findings CTA HEAD FINDINGS Anterior circulation: There is loss of right ICA enhancement within the carotid canal with reconstitution distally. Cavernous and supraclinoid portions are patent. Intracranial left ICA is patent. Bilateral anterior and middle cerebral arteries are patent. Posterior circulation: Intracranial vertebral arteries are patent and become diminutive after PICA origins. Basilar artery is patent and small in caliber. This is due to bilateral fetal origin of posterior cerebral arteries. Venous sinuses: As permitted by contrast timing, patent. 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 Remainder of proximal intracranial vessels are patent. Perfusion imaging demonstrates no evidence of core infarction despite findings on noncontrast head  CT. Calculated territory at risk of 242 mL throughout the right MCA territory as well as MCA/ACA and MCA/PCA watershed territories. No occlusion or hemodynamically significant stenosis in the neck. Electronically Signed: By: [name] M.D. On: 11/13/2020 11:05   CT ANGIO NECK W OR WO CONTRAST  Addendum Date: 11/13/2020   ADDENDUM REPORT: 11/13/2020 11:16 ADDENDUM: Transcription error in the impression. Should state occlusion or high-grade stenosis right petrous ICA. Electronically Signed   By: [name] M.D.   On: 11/13/2020 11:16   Result Date: 11/13/2020 CLINICAL DATA:  Code stroke follow-up, noncontrast CT positive for acute stroke EXAM: CT ANGIOGRAPHY HEAD AND NECK CT PERFUSION BRAIN TECHNIQUE: Multidetector CT imaging of the head and neck was performed using the standard protocol during bolus administration of intravenous contrast. Multiplanar CT image reconstructions and MIPs were obtained to evaluate the vascular anatomy. Carotid stenosis measurements (when applicable) are obtained utilizing NASCET criteria, using the distal internal carotid diameter as the denominator. Multiphase CT imaging of the brain was performed following IV bolus contrast injection. Subsequent parametric perfusion maps were calculated using RAPID software. CONTRAST:  126mL OMNIPAQUE IOHEXOL 350 MG/ML SOLN COMPARISON:  None. FINDINGS: CTA NECK FINDINGS Aortic arch: Great vessel origins are patent. 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 Right carotid system:  Patent. No measurable stenosis at the ICA origin. Left carotid system: Patent. No measurable stenosis at the ICA origin Vertebral arteries: Patent. Left vertebral artery is dominant. No measurable stenosis or evidence of dissection. Skeleton: No significant osseous abnormality Other neck: No mass or adenopathy. Upper chest: No apical lung mass. Review of the MIP images confirms the above findings CTA HEAD FINDINGS Anterior  circulation: There is loss of right ICA enhancement within the carotid canal with reconstitution distally. Cavernous and supraclinoid portions are patent. Intracranial left ICA is patent. Bilateral anterior and middle cerebral arteries are patent. Posterior circulation: Intracranial vertebral arteries are patent and become diminutive after PICA origins. Basilar artery is patent and small in caliber. This is due to bilateral fetal origin of posterior cerebral arteries. Venous sinuses: As permitted by contrast timing, patent. 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 Remainder of proximal intracranial vessels are patent. Perfusion imaging demonstrates no evidence of core infarction despite findings on noncontrast head CT. Calculated territory at risk of 242 mL throughout the right MCA territory as well as MCA/ACA and MCA/PCA watershed territories. No occlusion or hemodynamically significant stenosis in the neck. Electronically Signed: By: [name] M.D. On: 11/13/2020 11:05   CT CEREBRAL PERFUSION W CONTRAST  Addendum Date: 11/13/2020   ADDENDUM REPORT: 11/13/2020 11:16 ADDENDUM: Transcription error in the impression. Should state occlusion or high-grade stenosis right petrous ICA. Electronically Signed   By: [name] M.D.   On: 11/13/2020 11:16   Result Date: 11/13/2020 CLINICAL DATA:  Code stroke follow-up, noncontrast CT positive for acute stroke EXAM: CT ANGIOGRAPHY HEAD AND NECK CT PERFUSION BRAIN TECHNIQUE: Multidetector CT imaging of the head and neck was performed using the standard protocol during bolus administration of intravenous contrast. Multiplanar CT image reconstructions and MIPs were obtained to evaluate the vascular anatomy.  Carotid stenosis measurements (when applicable) are obtained utilizing NASCET criteria, using the distal internal carotid diameter as the denominator. Multiphase CT imaging of the brain was performed following IV bolus contrast injection. Subsequent parametric perfusion maps were calculated using RAPID software. CONTRAST:  153mL OMNIPAQUE IOHEXOL 350 MG/ML SOLN COMPARISON:  None. FINDINGS: CTA NECK FINDINGS Aortic arch: Great vessel origins are patent. 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 Right carotid system: Patent. No measurable stenosis at the ICA origin. Left carotid system: Patent. No measurable stenosis at the ICA origin Vertebral arteries: Patent. Left vertebral artery is dominant. No measurable stenosis or evidence of dissection. Skeleton: No significant osseous abnormality Other neck: No mass or adenopathy. Upper chest: No apical lung mass. Review of the MIP images confirms the above findings CTA HEAD FINDINGS Anterior circulation: There is loss of right ICA enhancement within the carotid canal with reconstitution distally. Cavernous and supraclinoid portions are patent. Intracranial left ICA is patent. Bilateral anterior and middle cerebral arteries are patent. Posterior circulation: Intracranial vertebral arteries are patent and become diminutive after PICA origins. Basilar artery is patent and small in caliber. This is due to bilateral fetal origin of posterior cerebral arteries. Venous sinuses: As permitted by contrast timing, patent. Review of the MIP images confirms the above findings CT Brain Perfusion Findings: CBF (<30%) Volume: 53mL Perfusion (Tmax>6.0s) volume: 253mL Mismatch Volume: 283mL Infarction Location:  Right MCA territory IMPRESSION: Occlusion of the right  petrous ICA within the carotid canal with reconstitution. Remainder of proximal intracranial vessels are patent. Perfusion imaging demonstrates no evidence of core infarction despite findings on  noncontrast head CT. Calculated territory at risk of 242 mL throughout the right MCA territory as well as MCA/ACA and MCA/PCA watershed territories. No occlusion or hemodynamically significant stenosis in the neck. Electronically Signed: By: [name] M.D. On: 11/13/2020 11:05   CT HEAD CODE STROKE WO CONTRAST  Result Date: 11/13/2020 CLINICAL DATA:  Code stroke.  Right-sided gaze, facial droop EXAM: CT HEAD WITHOUT CONTRAST TECHNIQUE: Contiguous axial images were obtained from the base of the skull through the vertex without intravenous contrast. COMPARISON:  None. FINDINGS: Brain: There is no acute intracranial hemorrhage. Hypoattenuation is present involving the right caudate, adjacent white matter, and superior lentiform nucleus. No significant mass effect. No hydrocephalus. No extra-axial collection. Vascular: No hyperdense vessel. Skull: Unremarkable. Sinuses/Orbits: No acute abnormality Other: Mastoid air cells are clear. ASPECTS [facility] Stroke Program Early CT Score) - Ganglionic level infarction (caudate, lentiform nuclei, internal capsule, insula, M1-M3 cortex): 4 - Supraganglionic infarction (M4-M6 cortex): 3 Total score (0-10 with 10 being normal): 7 IMPRESSION: No acute intracranial hemorrhage. Acute infarction involving the right caudate, adjacent white matter, and superior lentiform nucleus. ASPECTS is 7. These results were called by telephone at the time of interpretation on 11/13/2020 at 10:34 am to provider Dr. Erlinda Hong, who verbally acknowledged these results. Electronically Signed   By: [name] M.D.   On: 11/13/2020 10:38    Assessment: 40 y.o. female with PMH of migraine, COVID infection 09/2020 presented to ED for left arm and leg numbness and left arm weaker than leg since Sunday and worsenign with left facial droop and right gaze today at 9:15am. NIHSS = 14 now, CT head showed right caudate and BG subacute infarcts. pt not tPA candidate given outside window. CTA head and neck  showed right ICA petrous segment occlusion vs. High grade stenosis. CTP large penumbra on the right. Discussed with pt and family, they are in agreement for possible thrombectomy.   Stroke Risk Factors - obesity and recent COVID  Plan: - Stat thrombectomy with IR - discussed with Dr. Estanislado Pandy  - ICU admission post IR - Frequent neuro checks - Telemetry monitoring - MRI brain  - MRA head - Echocardiogram  - UDS, fasting lipid panel and HgbA1C - PT/OT/speech consult - BP goal per IR post thrombectomy - GI and DVT prophylaxis  - Will consider DAPT based on thrombectomy results - Will consider statin  - Discussed with Dr. Estanislado Pandy   This patient is critically ill due to ICA occlusion vs. High grade stenosis, acute and subacute right MCA infarcts and at significant risk of neurological worsening, death form recurrent stroke, hemorrhagic conversion, seizure, complications from IR procedure. This patient's care requires constant monitoring of vital signs, hemodynamics, respiratory and cardiac monitoring, review of multiple databases, neurological assessment, discussion with family, other specialists and medical decision making of high complexity. I spent 50 minutes of neurocritical care time in the care of this patient. I have discussed with pt and her husband and mom as well as Dr. Estanislado Pandy.  [name], MD PhD Stroke Neurology 11/13/2020 12:55 PM

## 2020-11-13 NOTE — ED Provider Notes (Addendum)
[facility] Provider Note   CSN: [id number] Arrival date & time: 11/13/20  1019     History No chief complaint on file.   Dana Henry is a 40 y.o. female.  HPI   40 year old female with past medical history of migraines, kidney stones with UTI, anemia presents the emergency department with concern for left-sided weakness.  Report is that 3 days ago on Sunday patient had sensory change and weakness mainly involving left upper extremity.  The sensory changes resolved but she continued to be weak in the left upper extremity.  Yesterday she had some continued mild weakness in the left upper extremity but today her symptoms are significantly more pronounced.  Fingerstick by EMS was normal.  Patient evaluated in the [facility] stroke page, went straight to [place] with neurology at bedside.    Past Medical History:  Diagnosis Date  . Anemia   . History of cesarean delivery affecting pregnancy 08/10/2017  . Kidney stones    surgically removed at 40YO and 40YO  . Migraines    associated with menstrual cycle  . UTI (urinary tract infection)   . Vaginal Pap smear, abnormal     Patient Active Problem List   Diagnosis Date Noted  . Migraines 10/06/2012    Past Surgical History:  Procedure Laterality Date  . CESAREAN SECTION N/A 07/21/2014   Procedure: CESAREAN SECTION;  Surgeon: Elveria Royals, MD;  Location: [facility];  Service: Obstetrics;  Laterality: N/A;  . CESAREAN SECTION N/A 08/10/2017   Procedure: Repeat CESAREAN SECTION;  Surgeon: Azucena Fallen, MD;  Location: [facility];  Service: Obstetrics;  Laterality: N/A;  EDD: 08/17/17 Allergy: Sulfa  . KIDNEY STONE SURGERY    . TONSILLECTOMY AND ADENOIDECTOMY  1991     OB History    Gravida  2   Para  2   Term  2   Preterm      AB      Living  2     SAB      IAB      Ectopic      Multiple  0   Live Births  2           Family History  Problem Relation  Age of Onset  . Hypertension Mother   . Alcohol abuse Father     Social History   Tobacco Use  . Smoking status: Former Smoker    Packs/day: 0.50    Types: Cigarettes    Quit date: 10/19/2013    Years since quitting: 7.0  . Smokeless tobacco: Never Used  Substance Use Topics  . Alcohol use: Yes    Comment: occassional  . Drug use: No    Home Medications Prior to Admission medications   Not on File    Allergies    Sulfa antibiotics  Review of Systems   Review of Systems  Unable to perform ROS: Acuity of condition    Physical Exam Updated Vital Signs LMP 11/05/2020   Physical Exam Vitals and nursing note reviewed.  Constitutional:      Appearance: Normal appearance.  HENT:     Head: Normocephalic.     Mouth/Throat:     Mouth: Mucous membranes are moist.  Cardiovascular:     Rate and Rhythm: Normal rate.  Pulmonary:     Effort: Pulmonary effort is normal. No respiratory distress.  Abdominal:     Palpations: Abdomen is soft.     Tenderness: There is no  abdominal tenderness.  Skin:    General: Skin is warm.  Neurological:     Mental Status: She is alert.     Comments: Please see [facility]  Psychiatric:        Mood and Affect: Mood normal.     ED Results / Procedures / Treatments   Labs (all labs ordered are listed, but only abnormal results are displayed) Labs Reviewed  I-STAT CHEM 8, ED - Abnormal; Notable for the following components:      Result Value   Calcium, Ion 1.09 (*)    All other components within normal limits  PROTIME-INR  APTT  CBC  DIFFERENTIAL  COMPREHENSIVE METABOLIC PANEL  CBG MONITORING, ED  I-STAT BETA HCG BLOOD, ED ([facility], [facility], [facility] ONLY)    EKG None  Radiology No results found.  Procedures .Critical Care Performed by: Lorelle Gibbs, DO Authorized by: Lorelle Gibbs, DO   Critical care provider statement:    Critical care time (minutes):  40   Critical care was necessary to treat or prevent imminent or  life-threatening deterioration of the following conditions:  CNS failure or compromise   Critical care was time spent personally by me on the following activities:  Discussions with consultants, evaluation of patient's response to treatment, examination of patient, ordering and performing treatments and interventions, ordering and review of laboratory studies, ordering and review of radiographic studies, pulse oximetry, re-evaluation of patient's condition, obtaining history from patient or surrogate and review of old charts   I assumed direction of critical care for this patient from another provider in my specialty: no       Medications Ordered in ED Medications  sodium chloride flush (NS) 0.9 % injection 3 mL (has no administration in time range)    ED Course  I have reviewed the triage vital signs and the nursing notes.  Pertinent labs & imaging results that were available during my care of the patient were reviewed by me and considered in my medical decision making (see chart for details).    MDM Rules/Calculators/A&P                          Patient evaluated in the [facility] stroke page.  Stroke neurology team at bedside.  Appears symptoms have been going on for at least 2 days.  Significant weakness in the left upper extremity.  NIH of at least 10, head CT without shows no acute bleed.  Neurology team to proceed with CTA imaging.  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  Final Clinical Impression(s) / ED Diagnoses Final diagnoses:  None    Rx / DC Orders ED Discharge Orders    None       Lorelle Gibbs, DO 11/13/20 1125    Sarafina Puthoff, Alvin Critchley, DO 11/13/20 1126

## 2020-11-14 ENCOUNTER — Encounter (HOSPITAL_COMMUNITY): Payer: Self-pay | Admitting: Radiology

## 2020-11-14 ENCOUNTER — Inpatient Hospital Stay (HOSPITAL_COMMUNITY): Payer: BC Managed Care – PPO

## 2020-11-14 ENCOUNTER — Other Ambulatory Visit: Payer: Self-pay | Admitting: Physician Assistant

## 2020-11-14 DIAGNOSIS — I63231 Cerebral infarction due to unspecified occlusion or stenosis of right carotid arteries: Secondary | ICD-10-CM

## 2020-11-14 DIAGNOSIS — E7800 Pure hypercholesterolemia, unspecified: Secondary | ICD-10-CM

## 2020-11-14 DIAGNOSIS — Z8616 Personal history of COVID-19: Secondary | ICD-10-CM

## 2020-11-14 DIAGNOSIS — I639 Cerebral infarction, unspecified: Secondary | ICD-10-CM

## 2020-11-14 DIAGNOSIS — I6389 Other cerebral infarction: Secondary | ICD-10-CM

## 2020-11-14 LAB — BASIC METABOLIC PANEL
Anion gap: 12 (ref 5–15)
BUN: 9 mg/dL (ref 6–20)
CO2: 18 mmol/L — ABNORMAL LOW (ref 22–32)
Calcium: 8.4 mg/dL — ABNORMAL LOW (ref 8.9–10.3)
Chloride: 108 mmol/L (ref 98–111)
Creatinine, Ser: 0.79 mg/dL (ref 0.44–1.00)
GFR, Estimated: >60 mL/min (ref >60)
Glucose, Bld: 88 mg/dL (ref 70–99)
Potassium: 3.5 mmol/L (ref 3.5–5.1)
Sodium: 138 mmol/L (ref 135–145)

## 2020-11-14 LAB — LIPID PANEL
Cholesterol: 156 mg/dL (ref 0–200)
HDL: 38 mg/dL — ABNORMAL LOW (ref >40)
LDL Cholesterol: 96 mg/dL (ref 0–99)
Total CHOL/HDL Ratio: 4.1 RATIO
Triglycerides: 111 mg/dL (ref <150)
VLDL: 22 mg/dL (ref 0–40)

## 2020-11-14 LAB — HEMOGLOBIN A1C
Hgb A1c MFr Bld: 5.1 % (ref 4.8–5.6)
Mean Plasma Glucose: 99.67 mg/dL

## 2020-11-14 LAB — HIV ANTIBODY (ROUTINE TESTING W REFLEX): HIV Screen 4th Generation wRfx: NONREACTIVE

## 2020-11-14 LAB — ECHOCARDIOGRAM COMPLETE
Area-P 1/2: 5.2 cm2
Calc EF: 59.1 %
S' Lateral: 2.3 cm
Single Plane A2C EF: 55.8 %
Single Plane A4C EF: 65.3 %

## 2020-11-14 IMAGING — MR MR HEAD W/O CM
12 of 13 series · 44 of 48 positions shown · non-contrast
Comparison: Prior CTs from [DATE].

CLINICAL DATA: Follow-up examination for acute stroke. History of
emergent stent assisted angioplasty of right ICA petrous stenosis.

EXAM:
MRI HEAD WITHOUT CONTRAST
MRA HEAD WITHOUT CONTRAST
TECHNIQUE: Multiplanar, multiecho pulse sequences of the brain and surrounding
structures were obtained without intravenous contrast. Angiographic
images of the head were obtained using MRA technique without
contrast.

[Series 5: DWI · axial · 3.0mm · 0.88mm/px · z∈[-133,+17]mm · 8 of 104 slices shown (1 of 4)]
[im 1/104]
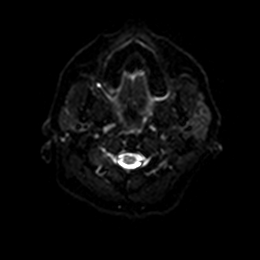
[im 15/104]
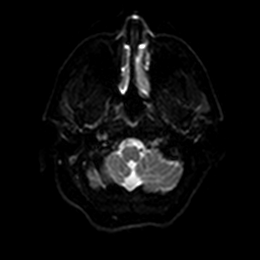
[im 30/104]
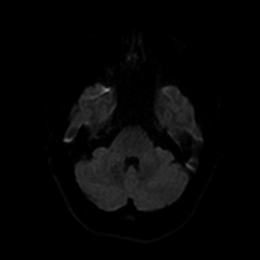
[im 45/104]
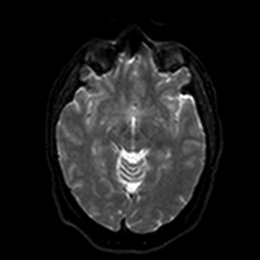
[im 59/104]
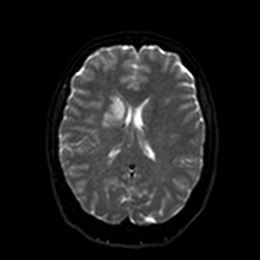
[im 74/104]
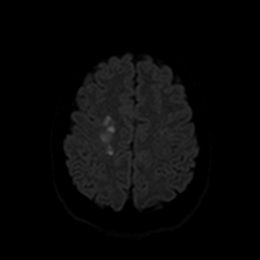
[im 89/104]
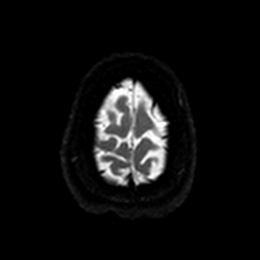
[im 104/104]
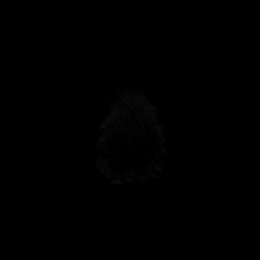

[Series 6: DWI · axial · 3.0mm · 0.88mm/px · z∈[-133,+17]mm · 4 of 52 slices shown (2 of 4)]
[im 1/52]
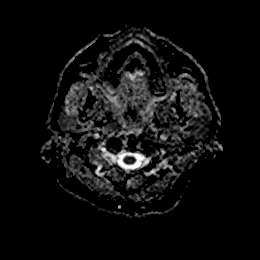
[im 18/52]
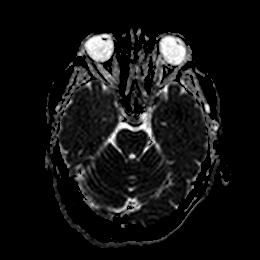
[im 35/52]
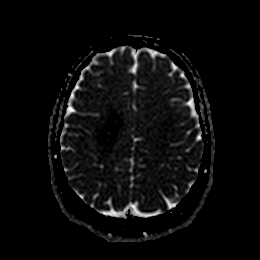
[im 52/52]
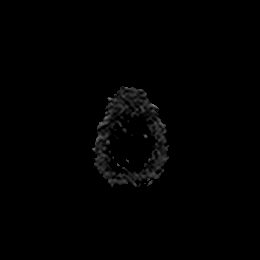

[Series 7: DWI · coronal · 4.0mm · 0.88mm/px · 5 of 68 slices shown (3 of 4)]
[im 1/68]
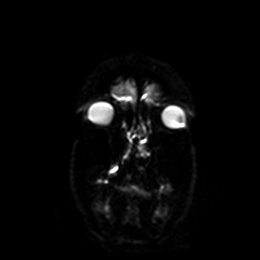
[im 17/68]
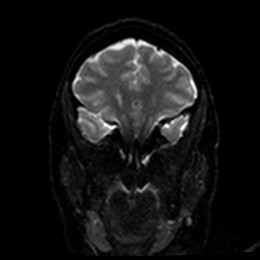
[im 34/68]
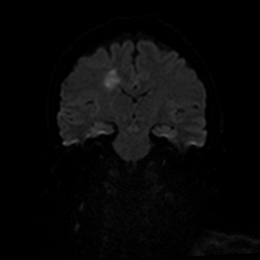
[im 51/68]
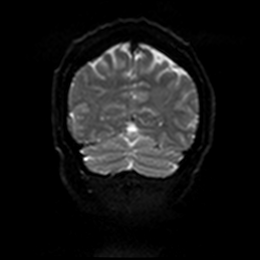
[im 68/68]
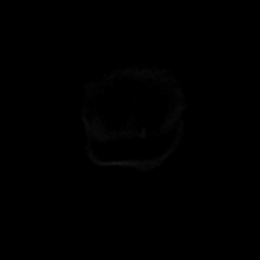

[Series 8: DWI · coronal · 4.0mm · 0.88mm/px · 3 of 34 slices shown (4 of 4)]
[im 1/34]
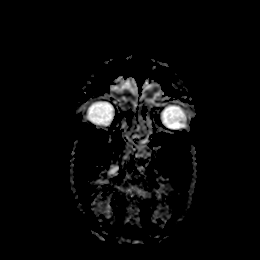
[im 17/34]
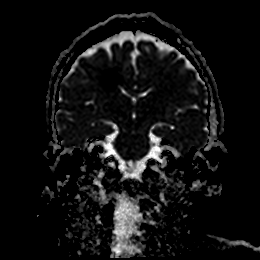
[im 34/34]
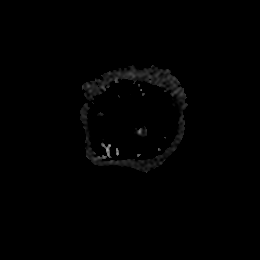

[Series 9: T1 · sagittal · 5.0mm · 0.75mm/px · 2 of 23 slices shown]
[im 1/23]
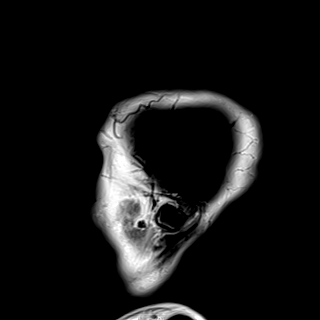
[im 23/23]
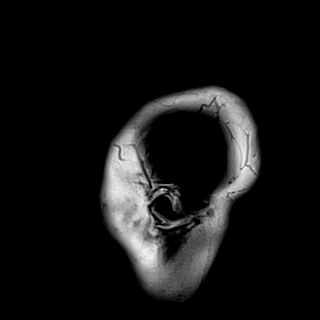

[Series 10: T2 · axial · 5.0mm · 0.72mm/px · z∈[-131,+16]mm · 2 of 26 slices shown (1 of 2)]
[im 1/26]
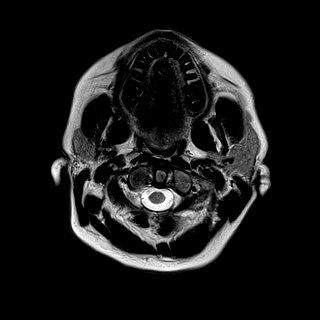
[im 26/26]
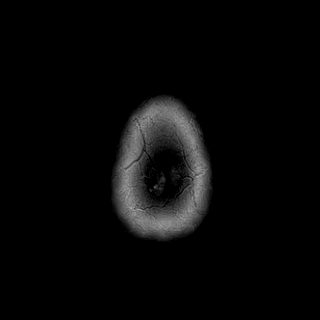

[Series 11: FLAIR · axial · 5.0mm · 0.45mm/px · z∈[-133,+14]mm · 2 of 26 slices shown]
[im 1/26]
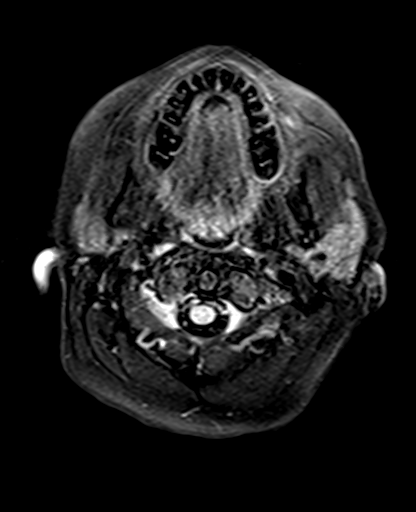
[im 26/26]
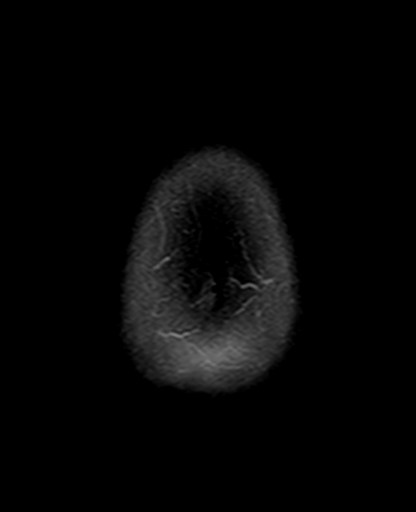

[Series 12: mag_images · axial · 3.0mm · 0.90mm/px · z∈[-134,+16]mm · 4 of 52 slices shown]
[im 1/52]
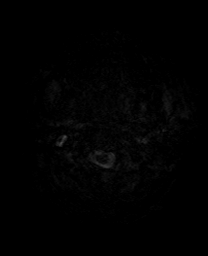
[im 18/52]
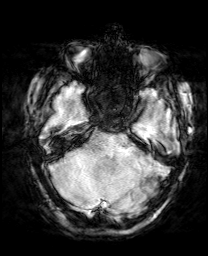
[im 35/52]
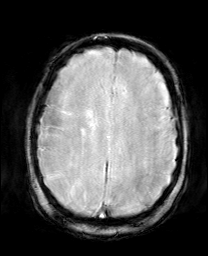
[im 52/52]
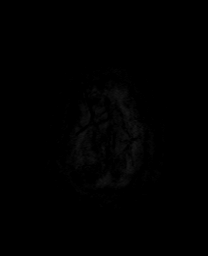

[Series 13: pha_images · axial · 3.0mm · 0.90mm/px · z∈[-134,+16]mm · 4 of 51 slices shown]
[im 1/51]
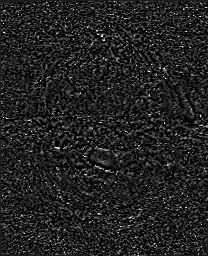
[im 17/51]
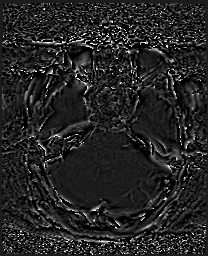
[im 34/51]
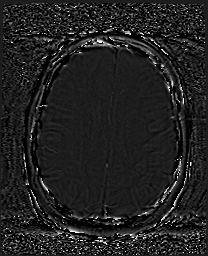
[im 51/51]
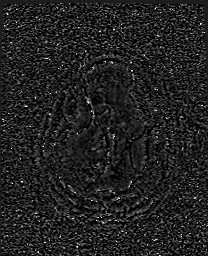

[Series 14: swi_images · axial · 3.0mm · 0.90mm/px · z∈[-134,+16]mm · 4 of 52 slices shown]
[im 1/52]
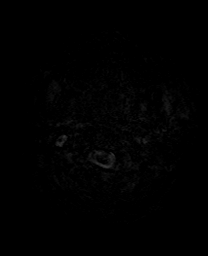
[im 18/52]
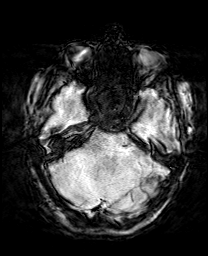
[im 35/52]
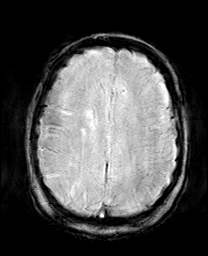
[im 52/52]
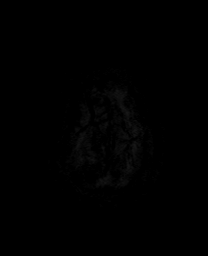

[Series 15: mip_images(sw) · axial · 24.0mm · 0.90mm/px · z∈[-124,+5]mm · 4 of 45 slices shown]
[im 1/45]
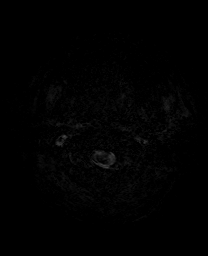
[im 15/45]
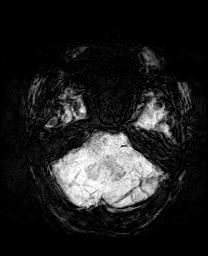
[im 30/45]
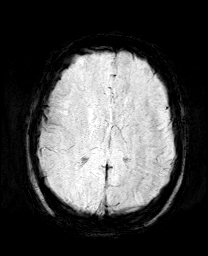
[im 45/45]
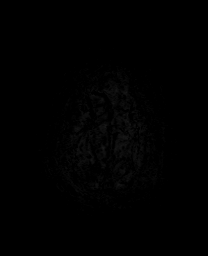

[Series 17: T2 · coronal · 5.0mm · 0.34mm/px · 2 of 29 slices shown (2 of 2)]
[im 1/29]
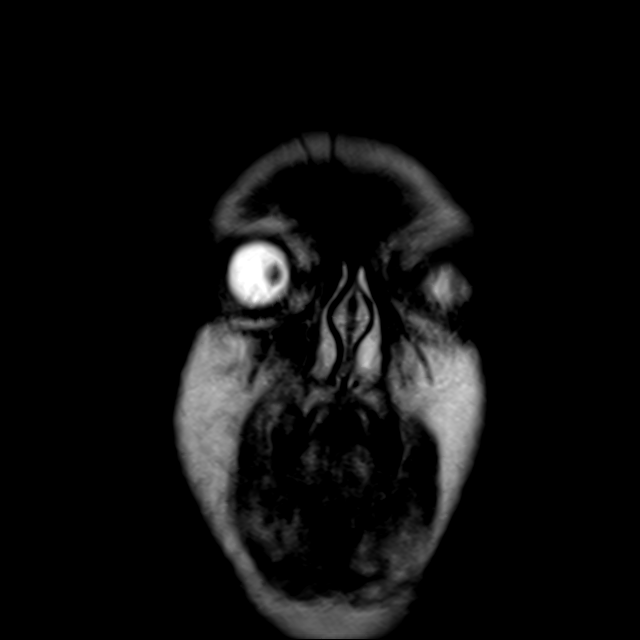
[im 29/29]
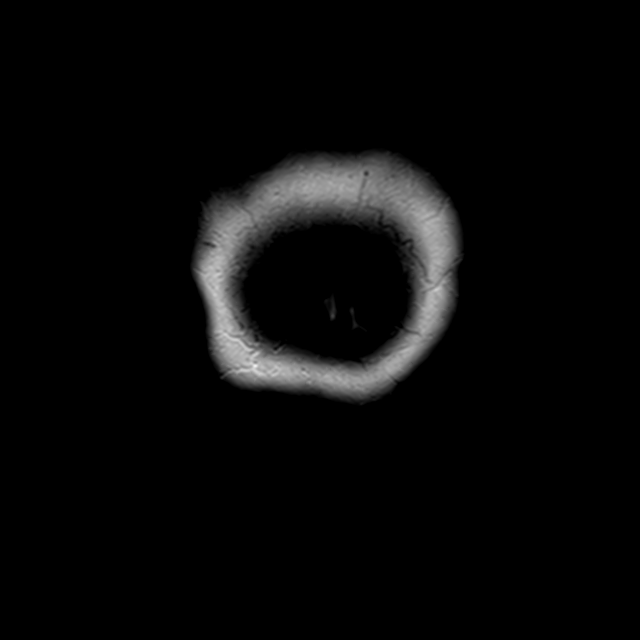

[44 of 48 positions shown; findings below may reference images not displayed]

FINDINGS: MRI HEAD FINDINGS

Brain: Examination degraded by motion artifact.

Cerebral volume within normal limits. No significant cerebral white
matter disease.

Patchy multifocal ischemic infarcts seen involving the subcortical
and deep white matter of the right frontal and parietal lobes,
largely watershed in distribution. Largest area of ischemia measures
up to 3.1 cm at the right corona radiata just above the right basal
ganglia. No associated hemorrhage or significant mass effect.

No other evidence for acute or subacute ischemia. Gray-white matter
differentiation otherwise maintained. No encephalomalacia to suggest
chronic cortical infarction elsewhere within the brain. No evidence
for acute or chronic intracranial hemorrhage.

No mass lesion, midline shift or mass effect. No hydrocephalus or
extra-axial fluid collection. Pituitary gland suprasellar region
normal. Midline structures intact.

Vascular: Major intracranial vascular flow voids are maintained.
Diminutive vertebrobasilar system noted.

Skull and upper cervical spine: Craniocervical junction within
normal limits. Upper cervical spine normal. Diffusely decreased T1
signal intensity seen within the visualized bone marrow,
nonspecific, but most commonly related to anemia, smoking, or
obesity. No focal marrow replacing lesion. No scalp soft tissue
abnormality.

Sinuses/Orbits: Globes and orbital soft tissues within normal
limits. Mild scattered mucosal thickening noted within the paranasal
sinuses. Right maxillary sinus retention cyst noted. Trace right
mastoid effusion noted, of doubtful significance. Inner ear
structures grossly normal.

Other: None.

MRA HEAD FINDINGS

ANTERIOR CIRCULATION:

Visualized distal cervical segments of the internal carotid arteries
are patent with antegrade flow. Flow related signal loss seen within
the petrous right ICA consistent with recent stent placement.
Evaluation for implant stenosis limited due to susceptibility
artifact. Widely plate and flow is seen distally within the
cavernous and supraclinoid right ICA. The contralateral petrous,
cavernous, and supraclinoid left ICAs widely patent.

A1 segments widely patent. Normal anterior communicating artery
complex. Anterior cerebral arteries patent to their distal aspects
without stenosis. No M1 stenosis or occlusion. Negative MCA
bifurcations. Distal MCA branches well perfused and symmetric.

POSTERIOR CIRCULATION:

Left vertebral artery dominant and widely patent to the
vertebrobasilar junction. Hypoplastic right vertebral artery largely
terminates in PICA. Right PICA itself is patent. Left PICA origin
patent and normal as well. Basilar diffusely diminutive but remains
widely patent. Superior cerebellar arteries patent bilaterally.
Predominant fetal type origin of both PCAs supplied via robust
bilateral posterior communicating arteries. PCAs remain widely
patent to their distal aspects.

No intracranial aneurysm.
IMPRESSION: MRI HEAD IMPRESSION:

1. Patchy multifocal acute ischemic right MCA territory infarcts
involving the subcortical and deep white matter of the right frontal
and parietal lobes, largely watershed in distribution. No associated
hemorrhage or significant mass effect.
2. Otherwise normal brain MRI for age.

MRA HEAD IMPRESSION:

1. Sequelae of recent stent placement within the petrous right ICA.
Evaluation for implant stenosis limited by susceptibility artifact,
however, widely patent flow seen distally within the right carotid
siphon.
2. No other hemodynamically significant stenosis or other vascular
abnormality within the intracranial circulation.
3. Fetal type origin of both PCAs with overall diminutive
vertebrobasilar system.

## 2020-11-14 IMAGING — MR MR MRA HEAD W/O CM
1 series · 17 of 48 positions shown · non-contrast
Comparison: Prior CTs from [DATE].

CLINICAL DATA: Follow-up examination for acute stroke. History of
emergent stent assisted angioplasty of right ICA petrous stenosis.

EXAM:
MRI HEAD WITHOUT CONTRAST
MRA HEAD WITHOUT CONTRAST
TECHNIQUE: Multiplanar, multiecho pulse sequences of the brain and surrounding
structures were obtained without intravenous contrast. Angiographic
images of the head were obtained using MRA technique without
contrast.

[Series 9: 3d cow · axial · 0.5mm · 0.41mm/px · z∈[-130,-51]mm · 17 of 172 slices shown]
[im 1/172]
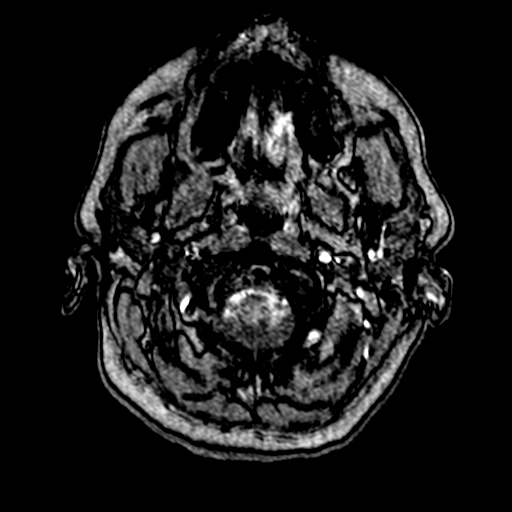
[im 4/172]
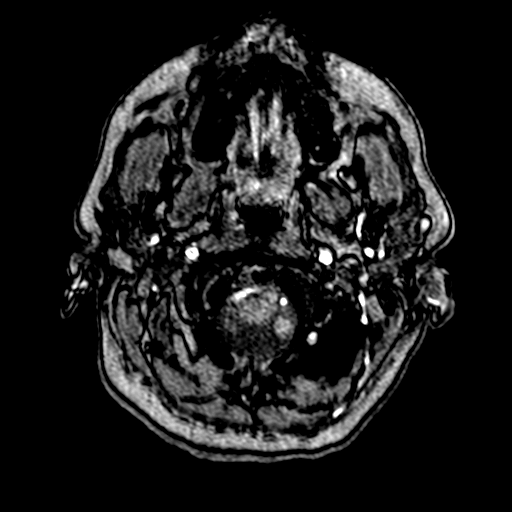
[im 8/172]
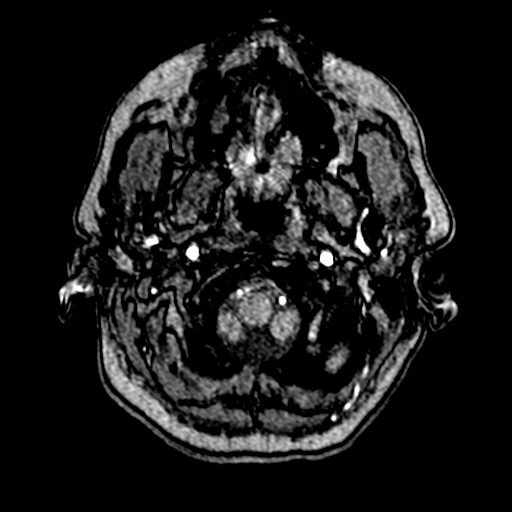
[im 11/172]
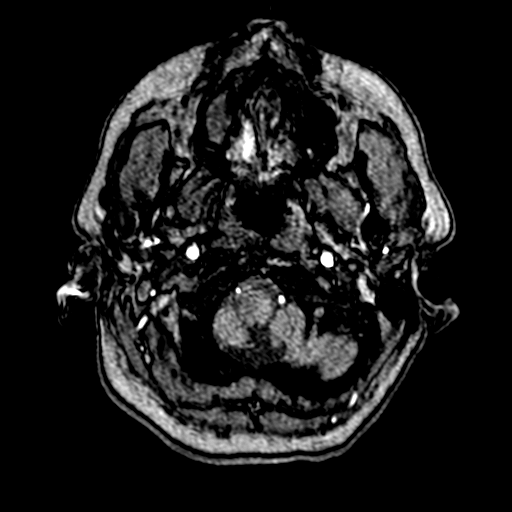
[im 15/172]
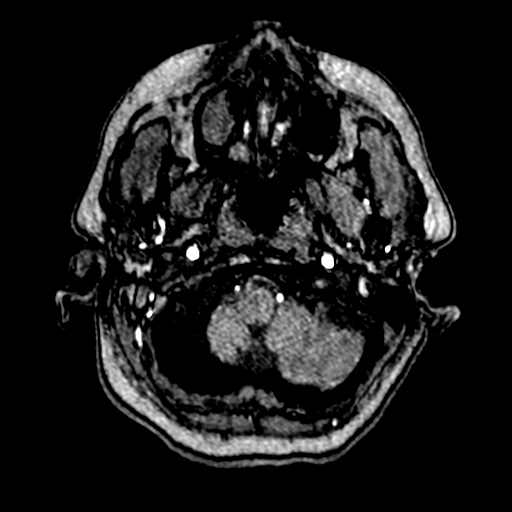
[im 19/172]
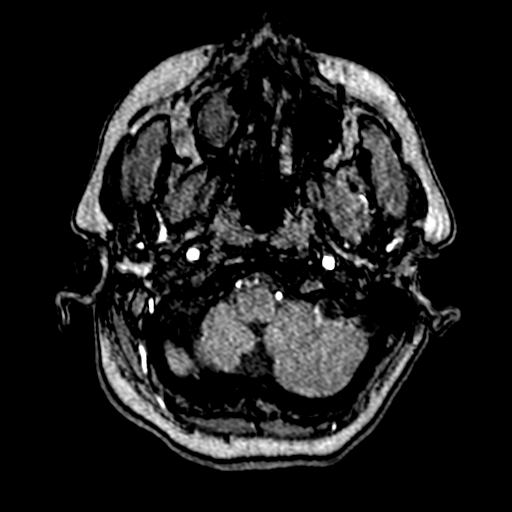
[im 22/172]
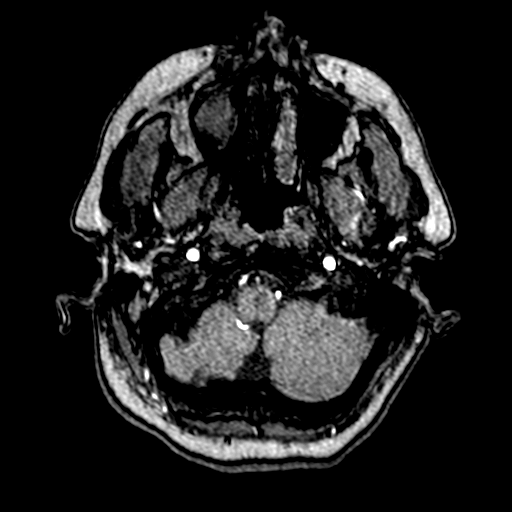
[im 30/172]
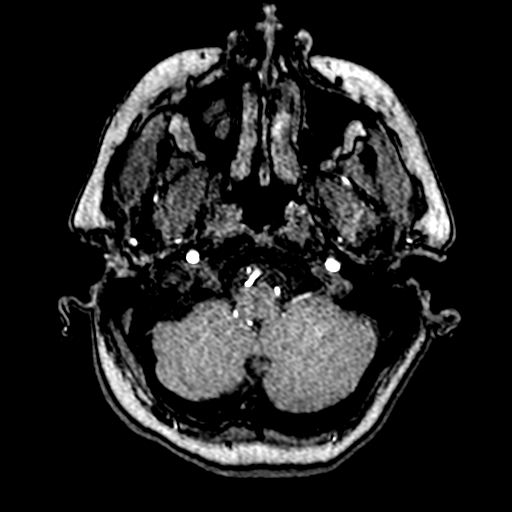
[im 33/172]
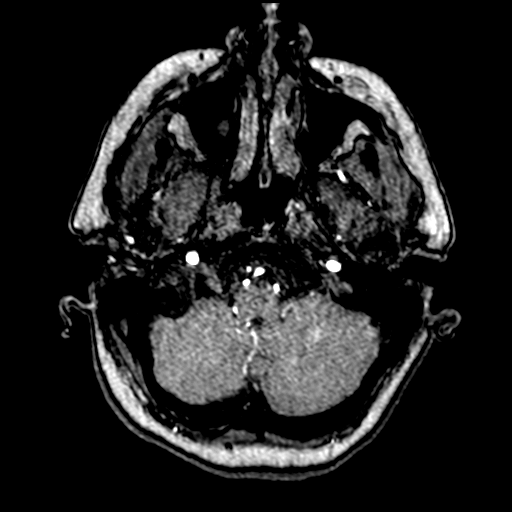
[im 55/172]
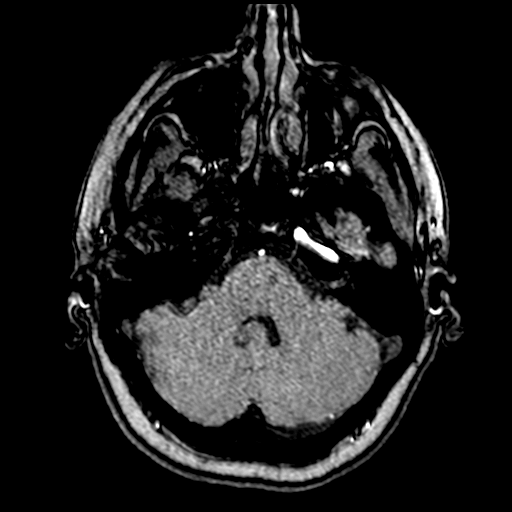
[im 77/172]
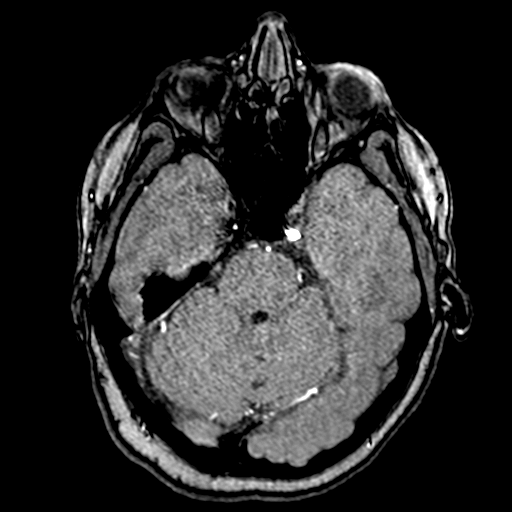
[im 88/172]
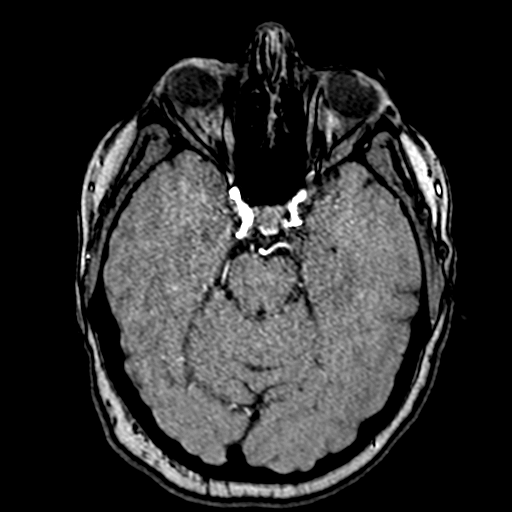
[im 99/172]
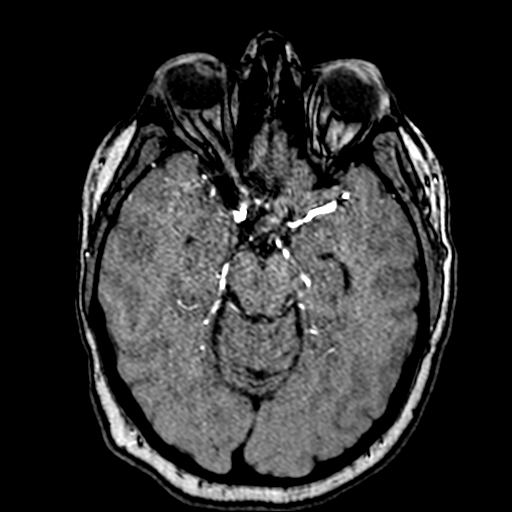
[im 121/172]
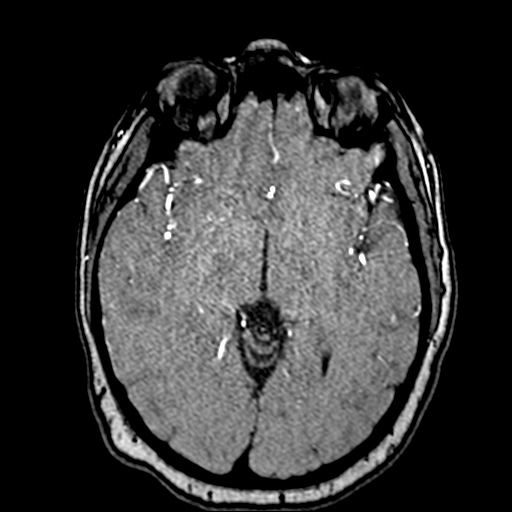
[im 142/172]
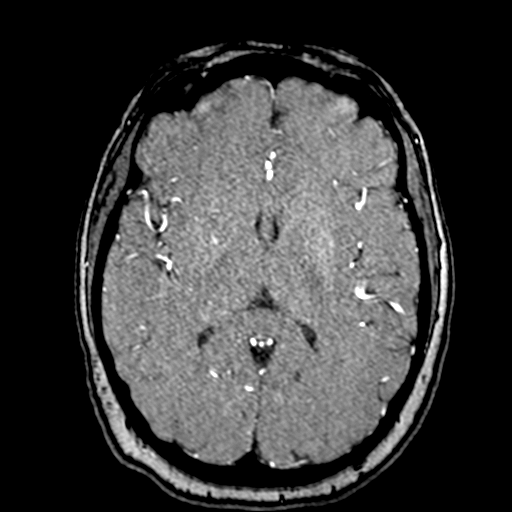
[im 146/172]
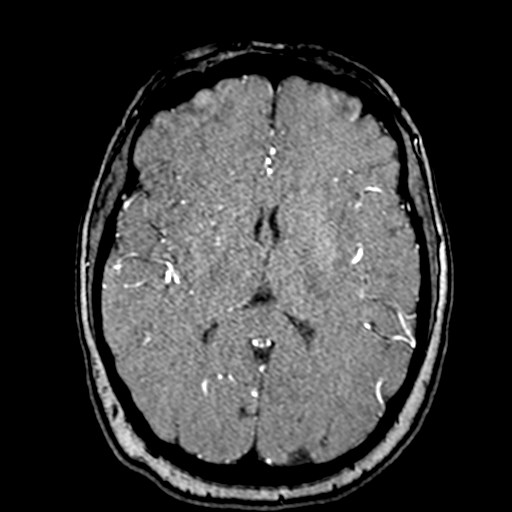
[im 164/172]
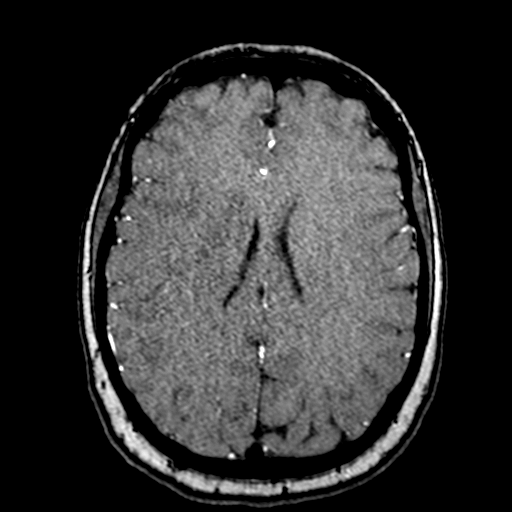

[17 of 48 positions shown; findings below may reference images not displayed]

FINDINGS: MRI HEAD FINDINGS

Brain: Examination degraded by motion artifact.

Cerebral volume within normal limits. No significant cerebral white
matter disease.

Patchy multifocal ischemic infarcts seen involving the subcortical
and deep white matter of the right frontal and parietal lobes,
largely watershed in distribution. Largest area of ischemia measures
up to 3.1 cm at the right corona radiata just above the right basal
ganglia. No associated hemorrhage or significant mass effect.

No other evidence for acute or subacute ischemia. Gray-white matter
differentiation otherwise maintained. No encephalomalacia to suggest
chronic cortical infarction elsewhere within the brain. No evidence
for acute or chronic intracranial hemorrhage.

No mass lesion, midline shift or mass effect. No hydrocephalus or
extra-axial fluid collection. Pituitary gland suprasellar region
normal. Midline structures intact.

Vascular: Major intracranial vascular flow voids are maintained.
Diminutive vertebrobasilar system noted.

Skull and upper cervical spine: Craniocervical junction within
normal limits. Upper cervical spine normal. Diffusely decreased T1
signal intensity seen within the visualized bone marrow,
nonspecific, but most commonly related to anemia, smoking, or
obesity. No focal marrow replacing lesion. No scalp soft tissue
abnormality.

Sinuses/Orbits: Globes and orbital soft tissues within normal
limits. Mild scattered mucosal thickening noted within the paranasal
sinuses. Right maxillary sinus retention cyst noted. Trace right
mastoid effusion noted, of doubtful significance. Inner ear
structures grossly normal.

Other: None.

MRA HEAD FINDINGS

ANTERIOR CIRCULATION:

Visualized distal cervical segments of the internal carotid arteries
are patent with antegrade flow. Flow related signal loss seen within
the petrous right ICA consistent with recent stent placement.
Evaluation for implant stenosis limited due to susceptibility
artifact. Widely plate and flow is seen distally within the
cavernous and supraclinoid right ICA. The contralateral petrous,
cavernous, and supraclinoid left ICAs widely patent.

A1 segments widely patent. Normal anterior communicating artery
complex. Anterior cerebral arteries patent to their distal aspects
without stenosis. No M1 stenosis or occlusion. Negative MCA
bifurcations. Distal MCA branches well perfused and symmetric.

POSTERIOR CIRCULATION:

Left vertebral artery dominant and widely patent to the
vertebrobasilar junction. Hypoplastic right vertebral artery largely
terminates in PICA. Right PICA itself is patent. Left PICA origin
patent and normal as well. Basilar diffusely diminutive but remains
widely patent. Superior cerebellar arteries patent bilaterally.
Predominant fetal type origin of both PCAs supplied via robust
bilateral posterior communicating arteries. PCAs remain widely
patent to their distal aspects.

No intracranial aneurysm.
IMPRESSION: MRI HEAD IMPRESSION:

1. Patchy multifocal acute ischemic right MCA territory infarcts
involving the subcortical and deep white matter of the right frontal
and parietal lobes, largely watershed in distribution. No associated
hemorrhage or significant mass effect.
2. Otherwise normal brain MRI for age.

MRA HEAD IMPRESSION:

1. Sequelae of recent stent placement within the petrous right ICA.
Evaluation for implant stenosis limited by susceptibility artifact,
however, widely patent flow seen distally within the right carotid
siphon.
2. No other hemodynamically significant stenosis or other vascular
abnormality within the intracranial circulation.
3. Fetal type origin of both PCAs with overall diminutive
vertebrobasilar system.

## 2020-11-14 MED ORDER — FENTANYL CITRATE (PF) 100 MCG/2ML IJ SOLN: 12.5000 ug | Freq: Once | INTRAMUSCULAR | Status: DC

## 2020-11-14 MED ORDER — PANTOPRAZOLE SODIUM 40 MG PO TBEC
40.0000 mg | DELAYED_RELEASE_TABLET | Freq: Every day | ORAL | Status: DC
  Administered 2020-11-14 – 2020-11-18 (×5): 40 mg via ORAL
  Filled 2020-11-14 (×5): qty 1

## 2020-11-14 MED ORDER — CHLORHEXIDINE GLUCONATE CLOTH 2 % EX PADS
6.0000 | MEDICATED_PAD | Freq: Every day | CUTANEOUS | Status: DC
  Administered 2020-11-14 – 2020-11-18 (×4): 6 via TOPICAL

## 2020-11-14 MED ORDER — INFLUENZA VAC SPLIT QUAD 0.5 ML IM SUSY
0.5000 mL | PREFILLED_SYRINGE | INTRAMUSCULAR | Status: AC
  Administered 2020-11-15: 0.5 mL via INTRAMUSCULAR
  Filled 2020-11-14: qty 0.5

## 2020-11-14 MED ORDER — ATORVASTATIN CALCIUM 40 MG PO TABS
40.0000 mg | ORAL_TABLET | Freq: Every day | ORAL | Status: DC
  Administered 2020-11-14 – 2020-11-18 (×5): 40 mg via ORAL
  Filled 2020-11-14 (×5): qty 1

## 2020-11-14 NOTE — Progress Notes (Signed)
Inpatient Rehab Admissions Coordinator Note:   Per therapy recommendations, pt was screened for CIR candidacy by Shann Medal, PT, DPT.  At this time we are recommending a CIR consult and I will place an order per our protocol.  Please contact me with questions.   Shann Medal, PT, DPT [phone] 11/14/20 12:28 PM

## 2020-11-14 NOTE — Progress Notes (Signed)
  Echocardiogram 2D Echocardiogram has been performed.  Bobbye Charleston 11/14/2020, 11:24 AM

## 2020-11-14 NOTE — Consult Note (Addendum)
Physical Medicine and Rehabilitation Consult Reason for Consult: Left side weakness with visual deficits Referring Physician: Dr.Xu  HPI: Dana Henry is a 40 y.o. right-handed female with history of migraine headaches, Covid infection 09/2020 remote tobacco abuse.  History taken from chart review of patient.  Patient lives with spouse and 2 young daughters.  Independent prior to admission.  Two-level home bed and bath on main level.  She presented on 11/13/2020 with acute left hemiparesis.  She was noted to have a fever of 102.  Cranial CT scan showed no acute intracranial hemorrhage.  Acute infarct involving the right caudate, adjacent white matter and superior lentiform nucleus.  Patient did not receive TPA.  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  Remainder of proximal intracranial vessels were patent.  No occlusion or hemodynamically significant stenosis in the neck.  Patient underwent cerebral arteriogram with emergent stent assisted angioplasty of right ICA per interventional radiology.  Echocardiogram with ejection fraction of 60-65%.  No wall motion abnormalities..  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  No associated hemorrhage or mass-effect.  Patient is currently maintained on aspirin as well as Brilinta for CVA prophylaxis.  Subcutaneous Lovenox for DVT prophylaxis.  Cleviprex for blood pressure control.  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  Therapy evaluations completed with recommendations of physical medicine rehab consult due to left-sided hemiparesis.  Review of Systems  Constitutional: Positive for malaise/fatigue. Negative for chills and fever.  HENT: Negative for hearing loss.   Eyes: Negative  for blurred vision and double vision.  Respiratory: Negative for cough.   Cardiovascular: Negative for chest pain, palpitations and leg swelling.  Gastrointestinal: Positive for constipation. Negative for heartburn, nausea and vomiting.  Genitourinary: Negative for dysuria, flank pain and hematuria.  Musculoskeletal: Positive for myalgias.  Skin: Negative for rash.  Neurological: Positive for weakness and headaches.  All other systems reviewed and are negative.  Past Medical History:  Diagnosis Date  . Anemia   . History of cesarean delivery affecting pregnancy 08/10/2017  . Kidney stones    surgically removed at 40YO and 40YO  . Migraines    associated with menstrual cycle  . UTI (urinary tract infection)   . Vaginal Pap smear, abnormal    Past Surgical History:  Procedure Laterality Date  . CESAREAN SECTION N/A 07/21/2014   Procedure: CESAREAN SECTION;  Surgeon: Elveria Royals, MD;  Location: [facility];  Service: Obstetrics;  Laterality: N/A;  . CESAREAN SECTION N/A 08/10/2017   Procedure: Repeat CESAREAN SECTION;  Surgeon: Azucena Fallen, MD;  Location: [facility];  Service: Obstetrics;  Laterality: N/A;  EDD: 08/17/17 Allergy: Sulfa  . KIDNEY STONE SURGERY    . RADIOLOGY WITH ANESTHESIA N/A 11/13/2020   Procedure: IR WITH ANESTHESIA;  Surgeon: Radiologist, Medication, MD;  Location: [facility];  Service: Radiology;  Laterality: N/A;  . TONSILLECTOMY AND ADENOIDECTOMY  1991   Family History  Problem Relation Age of Onset  . Hypertension Mother   . Alcohol abuse Father    Social History:  reports that she quit smoking about 7 years ago. Her smoking use included cigarettes. She smoked 0.50 packs per day. She has never used smokeless tobacco. She reports current alcohol use. She reports that she does not use drugs. Allergies:  Allergies  Allergen Reactions  . Sulfa Antibiotics Hives and Swelling   Medications Prior to Admission  Medication Sig Dispense Refill  . ibuprofen  (ADVIL) 200 MG tablet Take 400 mg by mouth every 6 (six) hours as needed for mild pain or headache.    . Multiple Vitamins-Minerals (MULTIVITAMIN WITH MINERALS) tablet Take 1 tablet by mouth daily.      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; supply chain Functional Status:  Mobility: Bed Mobility Overal bed mobility: Needs Assistance Bed Mobility: Supine to Sit,Sit to Supine Supine to sit: Min assist,+2 for physical assistance,+2 for safety/equipment,HOB elevated Sit to supine: Min assist,+2 for physical assistance,+2 for safety/equipment General bed mobility comments: assist for sequencing, management of LUE and LLE (back into bed) and line management Transfers Overall transfer level: Needs assistance Equipment used: 2 person hand held assist Transfers: Sit to/from Stand Sit to Stand: Min assist,From elevated surface,+2 safety/equipment,+2 physical assistance General transfer comment: LUE supported by therapist, min A for boost and balance, once from EOB, and once from recliner Ambulation/Gait Ambulation/Gait assistance: Min assist,+2 physical assistance,+2 safety/equipment Gait Distance (Feet): 17 Feet (5 ft) Assistive device: 2 person hand held assist Gait Pattern/deviations: Step-through pattern,Decreased stride length General Gait Details: pt being lead to turn left, yet her gaze remained to her right (head  and eyes to rt) but able to turn and look for chair on her left with cues Gait velocity: decr    ADL: ADL Overall ADL's : Needs assistance/impaired Eating/Feeding: NPO Grooming: Minimal assistance,Sitting Grooming Details (indicate cue type and reason): requires assist for BUE tasks, able to use RUE normally Upper Body Bathing: Moderate assistance,Sitting Lower Body Bathing: Maximal assistance Upper Body Dressing : Maximal assistance Lower Body Dressing: Total assistance Toilet Transfer: Moderate assistance,+2 for physical assistance,+2 for safety/equipment,Stand-pivot,BSC Toilet Transfer Details (indicate cue type and reason): 2 person HHA Toileting- Clothing Manipulation and Hygiene: Moderate assistance,+2 for physical assistance,+2 for safety/equipment,Sit to/from stand Functional mobility during ADLs: +2 for physical assistance,+2 for safety/equipment,Minimal assistance (2 person HHA) General ADL Comments: L inattention  Cognition: Cognition Overall Cognitive Status: Within Functional Limits for tasks assessed Orientation Level: Oriented X4 Cognition Arousal/Alertness: Awake/alert Behavior During Therapy: WFL for tasks assessed/performed Overall Cognitive Status: Within Functional Limits for tasks assessed  Blood pressure 109/66, pulse 79, temperature 98.6 F (37 C), temperature source Oral, resp. rate 18, last menstrual period 11/05/2020, SpO2 98 %, currently breastfeeding. Physical Exam Vitals reviewed.  Constitutional:      Appearance: She is obese.  HENT:     Head: Normocephalic and atraumatic.     Right Ear: External ear normal.     Left Ear: External ear normal.     Nose: Nose normal.  Eyes:     General:        Right eye: No discharge.        Left eye: No discharge.     Extraocular Movements: Extraocular movements intact.  Cardiovascular:     Rate and Rhythm: Normal rate and regular rhythm.  Pulmonary:     Effort: Pulmonary effort is normal.  No respiratory  distress.     Breath sounds: No stridor.  Abdominal:     General: Abdomen is flat. Bowel sounds are normal. There is no distension.  Musculoskeletal:     Cervical back: Normal range of motion and neck supple.     Comments: No edema or tenderness in extremities  Skin:    General: Skin is warm and dry.  Neurological:     Mental Status: She is alert.     Comments: Alert and oriented x3 She does display some right side neglect.  Follows simple commands. Motor: RUE/RLE: 4+-5/5 proximal distal LUE: 0/5 proximal distal LE: 4 -/5 proximal distal Patient weakness  Psychiatric:        Mood and Affect: Affect is blunt and flat.        Speech: Speech is delayed.     Results for orders placed or performed during the hospital encounter of 11/13/20 (from the past 24 hour(s))  Hemoglobin A1c     Status: None   Collection Time: 11/14/20  5:49 AM  Result Value Ref Range   Hgb A1c MFr Bld 5.1 4.8 - 5.6 %   Mean Plasma Glucose 99.67 mg/dL  Lipid panel     Status: Abnormal   Collection Time: 11/14/20  5:49 AM  Result Value Ref Range   Cholesterol 156 0 - 200 mg/dL   Triglycerides 111 <150 mg/dL   HDL 38 (L) >40 mg/dL   Total CHOL/HDL Ratio 4.1 RATIO   VLDL 22 0 - 40 mg/dL   LDL Cholesterol 96 0 - 99 mg/dL  Basic metabolic panel     Status: Abnormal   Collection Time: 11/14/20  5:49 AM  Result Value Ref Range   Sodium 138 135 - 145 mmol/L   Potassium 3.5 3.5 - 5.1 mmol/L   Chloride 108 98 - 111 mmol/L   CO2 18 (L) 22 - 32 mmol/L   Glucose, Bld 88 70 - 99 mg/dL   BUN 9 6 - 20 mg/dL   Creatinine, Ser 0.79 0.44 - 1.00 mg/dL   Calcium 8.4 (L) 8.9 - 10.3 mg/dL   GFR, Estimated >60 >60 mL/min   Anion gap 12 5 - 15  HIV Antibody (routine testing w rflx)     Status: None   Collection Time: 11/14/20  7:14 AM  Result Value Ref Range   HIV Screen 4th Generation wRfx Non Reactive Non Reactive   CT ANGIO HEAD W OR WO CONTRAST  Addendum Date: 11/13/2020   ADDENDUM REPORT: 11/13/2020 11:16  ADDENDUM: Transcription error in the impression. Should state occlusion or high-grade stenosis right petrous ICA. Electronically Signed   By: [name] M.D.   On: 11/13/2020 11:16   Result Date: 11/13/2020 CLINICAL DATA:  Code stroke follow-up, noncontrast CT positive for acute stroke EXAM: CT ANGIOGRAPHY HEAD AND NECK CT PERFUSION BRAIN TECHNIQUE: Multidetector CT imaging of the head and neck was performed using the standard protocol during bolus administration of intravenous contrast. Multiplanar CT image reconstructions and MIPs were obtained to evaluate the vascular anatomy. Carotid stenosis measurements (when applicable) are obtained utilizing NASCET criteria, using the distal internal carotid diameter as the denominator. Multiphase CT imaging of the brain was performed following IV bolus contrast injection. Subsequent parametric perfusion maps were calculated using RAPID software. CONTRAST:  141mL OMNIPAQUE IOHEXOL 350 MG/ML SOLN COMPARISON:  None. FINDINGS: CTA NECK FINDINGS Aortic arch: Great vessel origins are patent. 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 Right carotid system:  Patent. No measurable stenosis at the ICA origin. Left carotid system: Patent. No measurable stenosis at the ICA origin Vertebral arteries: Patent. Left vertebral artery is dominant. No measurable stenosis or evidence of dissection. Skeleton: No significant osseous abnormality Other neck: No mass or adenopathy. Upper chest: No apical lung mass. Review of the MIP images confirms the above findings CTA HEAD FINDINGS Anterior circulation: There is loss of right ICA enhancement within the carotid canal with reconstitution distally. Cavernous and supraclinoid portions are patent. Intracranial left ICA is patent. Bilateral anterior and middle cerebral arteries are patent. Posterior circulation: Intracranial vertebral arteries are patent and become diminutive after PICA origins. Basilar artery  is patent and small in caliber. This is due to bilateral fetal origin of posterior cerebral arteries. Venous sinuses: As permitted by contrast timing, patent. 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 Remainder of proximal intracranial vessels are patent. Perfusion imaging demonstrates no evidence of core infarction despite findings on noncontrast head CT. Calculated territory at risk of 242 mL throughout the right MCA territory as well as MCA/ACA and MCA/PCA watershed territories. No occlusion or hemodynamically significant stenosis in the neck. Electronically Signed: By: [name] M.D. On: 11/13/2020 11:05   CT HEAD WO CONTRAST  Result Date: 11/13/2020 CLINICAL DATA:  Stroke follow-up. Status post emergent stent assisted angioplasty of right ICA petrous segment stenosis. EXAM: CT HEAD WITHOUT CONTRAST TECHNIQUE: Contiguous axial images were obtained from the base of the skull through the vertex without intravenous contrast. COMPARISON:  Head CT and CTA earlier today FINDINGS: Brain: Hypodensity is again seen in the right basal ganglia and adjacent deep white matter corresponding to the previously described acute infarct. No significant infarct extension, new infarct, intracranial hemorrhage, mass, midline shift, or extra-axial fluid collection is identified. The ventricles and sulci are normal. Vascular: Interval right petrous ICA stenting. Skull: No fracture or suspicious osseous lesion. Sinuses/Orbits: Mild right maxillary sinus mucosal thickening. Clear mastoid air cells. Unremarkable orbits. Other: None. IMPRESSION: 1. Unchanged acute right basal ganglia infarct. 2. No evidence of hemorrhage or other new intracranial abnormality. Electronically  Signed   By: Logan Bores M.D.   On: 11/13/2020 17:27   CT ANGIO NECK W OR WO CONTRAST  Addendum Date: 11/13/2020   ADDENDUM REPORT: 11/13/2020 11:16 ADDENDUM: Transcription error in the impression. Should state occlusion or high-grade stenosis right petrous ICA. Electronically Signed   By: [name] M.D.   On: 11/13/2020 11:16   Result Date: 11/13/2020 CLINICAL DATA:  Code stroke follow-up, noncontrast CT positive for acute stroke EXAM: CT ANGIOGRAPHY HEAD AND NECK CT PERFUSION BRAIN TECHNIQUE: Multidetector CT imaging of the head and neck was performed using the standard protocol during bolus administration of intravenous contrast. Multiplanar CT image reconstructions and MIPs were obtained to evaluate the vascular anatomy. Carotid stenosis measurements (when applicable) are obtained utilizing NASCET criteria, using the distal internal carotid diameter as the denominator. Multiphase CT imaging of the brain was performed following IV bolus contrast injection. Subsequent parametric perfusion maps were calculated using RAPID software. CONTRAST:  143mL OMNIPAQUE IOHEXOL 350 MG/ML SOLN COMPARISON:  None. FINDINGS: CTA NECK FINDINGS Aortic arch: Great vessel origins are patent. 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 Right carotid system: Patent. No measurable stenosis at the ICA  origin. Left carotid system: Patent. No measurable stenosis at the ICA origin Vertebral arteries: Patent. Left vertebral artery is dominant. No measurable stenosis or evidence of dissection. Skeleton: No significant osseous abnormality Other neck: No mass or adenopathy. Upper chest: No apical lung mass. Review of the MIP images confirms the above findings CTA HEAD FINDINGS Anterior circulation: There is loss of right ICA enhancement within the carotid canal with reconstitution distally. Cavernous and supraclinoid portions are patent. Intracranial left ICA is patent. Bilateral anterior and middle  cerebral arteries are patent. Posterior circulation: Intracranial vertebral arteries are patent and become diminutive after PICA origins. Basilar artery is patent and small in caliber. This is due to bilateral fetal origin of posterior cerebral arteries. Venous sinuses: As permitted by contrast timing, patent. 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 Remainder of proximal intracranial vessels are patent. Perfusion imaging demonstrates no evidence of core infarction despite findings on noncontrast head CT. Calculated territory at risk of 242 mL throughout the right MCA territory as well as MCA/ACA and MCA/PCA watershed territories. No occlusion or hemodynamically significant stenosis in the neck. Electronically Signed: By: [name] M.D. On: 11/13/2020 11:05   MR ANGIO HEAD WO CONTRAST  Result Date: 11/14/2020 CLINICAL DATA:  Follow-up examination for acute stroke. History of emergent stent assisted angioplasty of right ICA petrous stenosis. EXAM: MRI HEAD WITHOUT CONTRAST MRA HEAD WITHOUT CONTRAST TECHNIQUE: Multiplanar, multiecho pulse sequences of the brain and surrounding structures were obtained without intravenous contrast. Angiographic images of the head were obtained using MRA technique without contrast. COMPARISON:  Prior CTs from 11/13/2020. FINDINGS: MRI HEAD FINDINGS Brain: Examination degraded by motion artifact. Cerebral volume within normal limits. No significant cerebral white matter disease. Patchy multifocal ischemic infarcts seen involving the subcortical and deep white matter of the right frontal and parietal lobes, largely watershed in distribution. Largest area of ischemia measures up to 3.1 cm at the right corona radiata just above  the right basal ganglia. No associated hemorrhage or significant mass effect. No other evidence for acute or subacute ischemia. Gray-white matter differentiation otherwise maintained. No encephalomalacia to suggest chronic cortical infarction elsewhere within the brain. No evidence for acute or chronic intracranial hemorrhage. No mass lesion, midline shift or mass effect. No hydrocephalus or extra-axial fluid collection. Pituitary gland suprasellar region normal. Midline structures intact. Vascular: Major intracranial vascular flow voids are maintained. Diminutive vertebrobasilar system noted. Skull and upper cervical spine: Craniocervical junction within normal limits. Upper cervical spine normal. Diffusely decreased T1 signal intensity seen within the visualized bone marrow, nonspecific, but most commonly related to anemia, smoking, or obesity. No focal marrow replacing lesion. No scalp soft tissue abnormality. Sinuses/Orbits: Globes and orbital soft tissues within normal limits. Mild scattered mucosal thickening noted within the paranasal sinuses. Right maxillary sinus retention cyst noted. Trace right mastoid effusion noted, of doubtful significance. Inner ear structures grossly normal. Other: None. MRA HEAD FINDINGS ANTERIOR CIRCULATION: Visualized distal cervical segments of the internal carotid arteries are patent with antegrade flow. Flow related signal loss seen within the petrous right ICA consistent with recent stent placement. Evaluation for implant stenosis limited due to susceptibility artifact. Widely plate and flow is seen distally within the cavernous and supraclinoid right ICA. The contralateral petrous, cavernous, and supraclinoid left ICAs widely patent. A1 segments widely  patent. Normal anterior communicating artery complex. Anterior cerebral arteries patent to their distal aspects without stenosis. No M1 stenosis or occlusion. Negative MCA bifurcations. Distal MCA branches well perfused and  symmetric. POSTERIOR CIRCULATION: Left vertebral artery dominant and widely patent to the vertebrobasilar junction. Hypoplastic right vertebral artery largely terminates in PICA. Right PICA itself is patent. Left PICA origin patent and normal as well. Basilar diffusely diminutive but remains widely patent. Superior cerebellar arteries patent bilaterally. Predominant fetal type origin of both PCAs supplied via robust bilateral posterior communicating arteries. PCAs remain widely patent to their distal aspects. No intracranial aneurysm. IMPRESSION: MRI HEAD IMPRESSION: 1. 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 No associated hemorrhage or significant mass effect. 2. Otherwise normal brain MRI for age. MRA HEAD IMPRESSION: 1. Sequelae of recent stent placement within the petrous right ICA. Evaluation for implant stenosis limited by susceptibility artifact, however, widely patent flow seen distally within the right carotid siphon. 2. No other hemodynamically significant stenosis or other vascular abnormality within the intracranial circulation. 3. Fetal type origin of both PCAs with overall diminutive vertebrobasilar system. Electronically Signed   By: Jeannine Boga M.D.   On: 11/14/2020 04:59   MR BRAIN WO CONTRAST  Result Date: 11/14/2020 CLINICAL DATA:  Follow-up examination for acute stroke. History of emergent stent assisted angioplasty of right ICA petrous stenosis. EXAM: MRI HEAD WITHOUT CONTRAST MRA HEAD WITHOUT CONTRAST TECHNIQUE: Multiplanar, multiecho pulse sequences of the brain and surrounding structures were obtained without intravenous contrast. Angiographic images of the head were obtained using MRA technique without contrast. COMPARISON:  Prior CTs from 11/13/2020. FINDINGS: MRI HEAD FINDINGS Brain: Examination degraded by motion artifact. Cerebral  volume within normal limits. No significant cerebral white matter disease. Patchy multifocal ischemic infarcts seen involving the subcortical and deep white matter of the right frontal and parietal lobes, largely watershed in distribution. Largest area of ischemia measures up to 3.1 cm at the right corona radiata just above the right basal ganglia. No associated hemorrhage or significant mass effect. No other evidence for acute or subacute ischemia. Gray-white matter differentiation otherwise maintained. No encephalomalacia to suggest chronic cortical infarction elsewhere within the brain. No evidence for acute or chronic intracranial hemorrhage. No mass lesion, midline shift or mass effect. No hydrocephalus or extra-axial fluid collection. Pituitary gland suprasellar region normal. Midline structures intact. Vascular: Major intracranial vascular flow voids are maintained. Diminutive vertebrobasilar system noted. Skull and upper cervical spine: Craniocervical junction within normal limits. Upper cervical spine normal. Diffusely decreased T1 signal intensity seen within the visualized bone marrow, nonspecific, but most commonly related to anemia, smoking, or obesity. No focal marrow replacing lesion. No scalp soft tissue abnormality. Sinuses/Orbits: Globes and orbital soft tissues within normal limits. Mild scattered mucosal thickening noted within the paranasal sinuses. Right maxillary sinus retention cyst noted. Trace right mastoid effusion noted, of doubtful significance. Inner ear structures grossly normal. Other: None. MRA HEAD FINDINGS ANTERIOR CIRCULATION: Visualized distal cervical segments of the internal carotid arteries are patent with antegrade flow. Flow related signal loss seen within the petrous right ICA consistent with recent stent placement. Evaluation for implant stenosis limited due to susceptibility artifact. Widely plate and flow is seen distally within the cavernous and supraclinoid right ICA.  The contralateral petrous, cavernous, and supraclinoid left ICAs widely patent. A1 segments widely patent. Normal anterior communicating artery complex. Anterior cerebral arteries patent to their distal aspects  without stenosis. No M1 stenosis or occlusion. Negative MCA bifurcations. Distal MCA branches well perfused and symmetric. POSTERIOR CIRCULATION: Left vertebral artery dominant and widely patent to the vertebrobasilar junction. Hypoplastic right vertebral artery largely terminates in PICA. Right PICA itself is patent. Left PICA origin patent and normal as well. Basilar diffusely diminutive but remains widely patent. Superior cerebellar arteries patent bilaterally. Predominant fetal type origin of both PCAs supplied via robust bilateral posterior communicating arteries. PCAs remain widely patent to their distal aspects. No intracranial aneurysm. IMPRESSION: MRI HEAD IMPRESSION: 1. 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 No associated hemorrhage or significant mass effect. 2. Otherwise normal brain MRI for age. MRA HEAD IMPRESSION: 1. Sequelae of recent stent placement within the petrous right ICA. Evaluation for implant stenosis limited by susceptibility artifact, however, widely patent flow seen distally within the right carotid siphon. 2. No other hemodynamically significant stenosis or other vascular abnormality within the intracranial circulation. 3. Fetal type origin of both PCAs with overall diminutive vertebrobasilar system. Electronically Signed   By: Jeannine Boga M.D.   On: 11/14/2020 04:59   CT CEREBRAL PERFUSION W CONTRAST  Addendum Date: 11/13/2020   ADDENDUM REPORT: 11/13/2020 11:16 ADDENDUM: Transcription error in the impression. Should state occlusion or high-grade stenosis right petrous ICA. Electronically Signed   By: [name] M.D.    On: 11/13/2020 11:16   Result Date: 11/13/2020 CLINICAL DATA:  Code stroke follow-up, noncontrast CT positive for acute stroke EXAM: CT ANGIOGRAPHY HEAD AND NECK CT PERFUSION BRAIN TECHNIQUE: Multidetector CT imaging of the head and neck was performed using the standard protocol during bolus administration of intravenous contrast. Multiplanar CT image reconstructions and MIPs were obtained to evaluate the vascular anatomy. Carotid stenosis measurements (when applicable) are obtained utilizing NASCET criteria, using the distal internal carotid diameter as the denominator. Multiphase CT imaging of the brain was performed following IV bolus contrast injection. Subsequent parametric perfusion maps were calculated using RAPID software. CONTRAST:  17mL OMNIPAQUE IOHEXOL 350 MG/ML SOLN COMPARISON:  None. FINDINGS: CTA NECK FINDINGS Aortic arch: Great vessel origins are patent. 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 Right carotid system: Patent. No measurable stenosis at the ICA origin. Left carotid system: Patent. No measurable stenosis at the ICA origin Vertebral arteries: Patent. Left vertebral artery is dominant. No measurable stenosis or evidence of dissection. Skeleton: No significant osseous abnormality Other neck: No mass or adenopathy. Upper chest: No apical lung mass. Review of the MIP images confirms the above findings CTA HEAD FINDINGS Anterior circulation: There is loss of right ICA enhancement within the carotid canal with reconstitution distally. Cavernous and supraclinoid portions are patent. Intracranial left ICA is patent. Bilateral anterior and middle cerebral arteries are patent. Posterior circulation: Intracranial vertebral arteries are patent and become diminutive after PICA origins. Basilar artery is patent and small in caliber. This is due to bilateral fetal origin of posterior cerebral arteries. Venous sinuses: As permitted by contrast timing, patent.  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 Remainder of proximal intracranial vessels are patent. Perfusion imaging demonstrates no evidence of core infarction despite findings on noncontrast head CT. Calculated territory at risk of 57  mL throughout the right MCA territory as well as MCA/ACA and MCA/PCA watershed territories. No occlusion or hemodynamically significant stenosis in the neck. Electronically Signed: By: [name] M.D. On: 11/13/2020 11:05   DG Chest Port 1 View  Result Date: 11/13/2020 CLINICAL DATA:  40 year old female with cough. EXAM: PORTABLE CHEST 1 VIEW COMPARISON:  None. FINDINGS: No focal consolidation, pleural effusion or pneumothorax. The cardiac silhouette is within limits. No acute osseous pathology. IMPRESSION: No active disease. Electronically Signed   By: Anner Crete M.D.   On: 11/13/2020 18:47   ECHOCARDIOGRAM COMPLETE  Result Date: 11/14/2020    ECHOCARDIOGRAM REPORT   Patient Name:   THEA TEESDALE Date of Exam: 11/14/2020 Medical Rec #:  [id number]      Height:       64.0 in Accession #:    [id number]     Weight:       180.0 lb Date of Birth:  [birth year]     BSA:          1.871 m Patient Age:    51 years       BP:           126/70 mmHg Patient Gender: F              HR:           78 bpm. Exam Location:  Inpatient Procedure: 2D Echo, 3D Echo, Cardiac Doppler and Color Doppler Indications:    Stroke  History:        Patient has no prior history of Echocardiogram examinations.                 Stroke. Covid infection 12/21.  Sonographer:    Roseanna Rainbow RDCS Referring Phys: [id number] [facility]  Sonographer Comments: Technically difficult study due to poor echo windows. IMPRESSIONS  1. Left ventricular ejection fraction, by  estimation, is 60 to 65%. The left ventricle has normal function. The left ventricle has no regional wall motion abnormalities. Left ventricular diastolic parameters were normal.  2. Right ventricular systolic function is normal. The right ventricular size is normal. There is normal pulmonary artery systolic pressure. The estimated right ventricular systolic pressure is 0000000 mmHg.  3. The mitral valve is normal in structure. Trivial mitral valve regurgitation. No evidence of mitral stenosis.  4. The aortic valve is tricuspid. Aortic valve regurgitation is not visualized. No aortic stenosis is present.  5. The inferior vena cava is normal in size with greater than 50% respiratory variability, suggesting right atrial pressure of 3 mmHg. FINDINGS  Left Ventricle: Left ventricular ejection fraction, by estimation, is 60 to 65%. The left ventricle has normal function. The left ventricle has no regional wall motion abnormalities. The left ventricular internal cavity size was normal in size. There is  no left ventricular hypertrophy. Left ventricular diastolic parameters were normal. Right Ventricle: The right ventricular size is normal. No increase in right ventricular wall thickness. Right ventricular systolic function is normal. There is normal pulmonary artery systolic pressure. The tricuspid regurgitant velocity is 1.84 m/s, and  with an assumed right atrial pressure of 3 mmHg, the estimated right ventricular systolic pressure is 0000000 mmHg. Left Atrium: Left atrial size was normal in size. Right Atrium: Right atrial size was normal in size. Pericardium: There is no evidence of pericardial effusion. Mitral Valve: The mitral valve is normal in structure. Trivial mitral valve regurgitation. No evidence of mitral valve stenosis. Tricuspid Valve: The tricuspid valve is normal in structure. Tricuspid  valve regurgitation is trivial. Aortic Valve: The aortic valve is tricuspid. Aortic valve regurgitation is not visualized. No  aortic stenosis is present. Pulmonic Valve: The pulmonic valve was normal in structure. Pulmonic valve regurgitation is trivial. Aorta: The aortic root is normal in size and structure. Venous: The inferior vena cava is normal in size with greater than 50% respiratory variability, suggesting right atrial pressure of 3 mmHg. IAS/Shunts: No atrial level shunt detected by color flow Doppler.  LEFT VENTRICLE PLAX 2D LVIDd:         4.10 cm     Diastology LVIDs:         2.30 cm     LV e' medial:    9.25 cm/s LV PW:         1.20 cm     LV E/e' medial:  9.6 LV IVS:        1.10 cm     LV e' lateral:   10.70 cm/s LVOT diam:     1.50 cm     LV E/e' lateral: 8.3 LV SV:         34 LV SV Index:   18 LVOT Area:     1.77 cm  LV Volumes (MOD) LV vol d, MOD A2C: 38.0 ml LV vol d, MOD A4C: 61.9 ml LV vol s, MOD A2C: 16.8 ml LV vol s, MOD A4C: 21.5 ml LV SV MOD A2C:     21.2 ml LV SV MOD A4C:     61.9 ml LV SV MOD BP:      28.4 ml RIGHT VENTRICLE             IVC RV S prime:     12.20 cm/s  IVC diam: 2.10 cm TAPSE (M-mode): 2.0 cm LEFT ATRIUM             Index       RIGHT ATRIUM           Index LA diam:        3.40 cm 1.82 cm/m  RA Area:     10.70 cm LA Vol (A2C):   24.2 ml 12.94 ml/m RA Volume:   19.30 ml  10.32 ml/m LA Vol (A4C):   23.6 ml 12.62 ml/m LA Biplane Vol: 24.1 ml 12.88 ml/m  AORTIC VALVE LVOT Vmax:   105.00 cm/s LVOT Vmean:  65.700 cm/s LVOT VTI:    0.191 m  AORTA Ao Root diam: 3.10 cm Ao Asc diam:  2.80 cm MITRAL VALVE               TRICUSPID VALVE MV Area (PHT): 5.20 cm    TR Peak grad:   13.5 mmHg MV Decel Time: 146 msec    TR Vmax:        184.00 cm/s MV E velocity: 89.20 cm/s MV A velocity: 72.70 cm/s  SHUNTS MV E/A ratio:  1.23        Systemic VTI:  0.19 m                            Systemic Diam: 1.50 cm Loralie Champagne MD Electronically signed by Loralie Champagne MD Signature Date/Time: 11/14/2020/6:43:52 PM    Final    CT HEAD CODE STROKE WO CONTRAST  Result Date: 11/13/2020 CLINICAL DATA:  Code stroke.   Right-sided gaze, facial droop EXAM: CT HEAD WITHOUT CONTRAST TECHNIQUE: Contiguous axial images were obtained from the base of the skull through the vertex  without intravenous contrast. COMPARISON:  None. FINDINGS: Brain: There is no acute intracranial hemorrhage. Hypoattenuation is present involving the right caudate, adjacent white matter, and superior lentiform nucleus. No significant mass effect. No hydrocephalus. No extra-axial collection. Vascular: No hyperdense vessel. Skull: Unremarkable. Sinuses/Orbits: No acute abnormality Other: Mastoid air cells are clear. ASPECTS [facility] Stroke Program Early CT Score) - Ganglionic level infarction (caudate, lentiform nuclei, internal capsule, insula, M1-M3 cortex): 4 - Supraganglionic infarction (M4-M6 cortex): 3 Total score (0-10 with 10 being normal): 7 IMPRESSION: No acute intracranial hemorrhage. Acute infarction involving the right caudate, adjacent white matter, and superior lentiform nucleus. ASPECTS is 7. These results were called by telephone at the time of interpretation on 11/13/2020 at 10:34 am to provider Dr. Erlinda Hong, who verbally acknowledged these results. Electronically Signed   By: [name] M.D.   On: 11/13/2020 10:38   VAS [place] LOWER EXTREMITY VENOUS (DVT)  Result Date: 11/14/2020  Lower Venous DVT Study Indications: Stroke.  Risk Factors: None identified. Anticoagulation: Lovenox. Comparison Study: No previous exam Performing Technologist: Vonzell Schlatter RVT  Examination Guidelines: A complete evaluation includes B-mode imaging, spectral Doppler, color Doppler, and power Doppler as needed of all accessible portions of each vessel. Bilateral testing is considered an integral part of a complete examination. Limited examinations for reoccurring indications may be performed as noted. The reflux portion of the exam is performed with the patient in reverse Trendelenburg.  +---------+---------------+---------+-----------+----------+--------------+ RIGHT     CompressibilityPhasicitySpontaneityPropertiesThrombus Aging +---------+---------------+---------+-----------+----------+--------------+ CFV      Full           Yes      Yes                                 +---------+---------------+---------+-----------+----------+--------------+ SFJ      Full                                                        +---------+---------------+---------+-----------+----------+--------------+ FV Prox  Full                                                        +---------+---------------+---------+-----------+----------+--------------+ FV Mid   Full                                                        +---------+---------------+---------+-----------+----------+--------------+ FV DistalFull                                                        +---------+---------------+---------+-----------+----------+--------------+ PFV      Full                                                        +---------+---------------+---------+-----------+----------+--------------+  POP      Full           Yes      Yes                                 +---------+---------------+---------+-----------+----------+--------------+ PTV      Full                                                        +---------+---------------+---------+-----------+----------+--------------+ PERO     Full                                                        +---------+---------------+---------+-----------+----------+--------------+   +---------+---------------+---------+-----------+----------+--------------+ LEFT     CompressibilityPhasicitySpontaneityPropertiesThrombus Aging +---------+---------------+---------+-----------+----------+--------------+ CFV      Full           Yes      Yes                                 +---------+---------------+---------+-----------+----------+--------------+ SFJ      Full                                                         +---------+---------------+---------+-----------+----------+--------------+ FV Prox  Full                                                        +---------+---------------+---------+-----------+----------+--------------+ FV Mid   Full                                                        +---------+---------------+---------+-----------+----------+--------------+ FV DistalFull                                                        +---------+---------------+---------+-----------+----------+--------------+ PFV      Full                                                        +---------+---------------+---------+-----------+----------+--------------+ POP      Full           Yes      Yes                                 +---------+---------------+---------+-----------+----------+--------------+  PTV      Full                                                        +---------+---------------+---------+-----------+----------+--------------+ PERO     Full                                                        +---------+---------------+---------+-----------+----------+--------------+  Summary: BILATERAL: - No evidence of deep vein thrombosis seen in the lower extremities, bilaterally. -No evidence of popliteal cyst, bilaterally.   *See table(s) above for measurements and observations. Electronically signed by Servando Snare MD on 11/14/2020 at 1:58:02 PM.    Final     Assessment/Plan: Diagnosis: Acute infarct involving the right caudate, adjacent white matter and superior lentiform nucleus.   Stroke: Continue secondary stroke prophylaxis and Risk Factor Modification listed below:   Antiplatelet therapy:   Blood Pressure Management:  Continue current medication with prn's with permisive HTN per primary team Statin Agent:   Diabetes management:   Tobacco abuse:   Left sided hemiparesis: fit for orthosis to prevent contractures (resting hand splint for  day, wrist cock up splint at night, PRAFO, etc) PT/OT for mobility, ADL training  Motor recovery: Fluoxetine Labs independently reviewed.  Records reviewed and summated above.multifocal acute ischemic right MCA territory infarcts    1. Does the need for close, 24 hr/day medical supervision in concert with the patient's rehab needs make it unreasonable for this patient to be served in a less intensive setting? Yes  2. Co-Morbidities requiring supervision/potential complications: migraine headaches, Covid infection 09/2020, remote tobacco abuse, post stroke dysphagia (advance diet as tolerated), ABLA (repeat labs, consider transfusion if necessary to ensure appropriate perfusion for increased activity tolerance) 3. Due to bladder management, safety, medication administration and patient education, does the patient require 24 hr/day rehab nursing? Yes 4. Does the patient require coordinated care of a physician, rehab nurse, therapy disciplines of PT/OT/SLP to address physical and functional deficits in the context of the above medical diagnosis(es)? Yes Addressing deficits in the following areas: balance, endurance, locomotion, strength, transferring, bathing, dressing, toileting, cognition and psychosocial support 5. Can the patient actively participate in an intensive therapy program of at least 3 hrs of therapy per day at least 5 days per week? Yes 6. The potential for patient to make measurable gains while on inpatient rehab is excellent 7. Anticipated functional outcomes upon discharge from inpatient rehab are supervision and min assist  with PT, supervision and min assist with OT, modified independent with SLP. 8. Estimated rehab length of stay to reach the above functional goals is: 13-16 days. 9. Anticipated discharge destination: Home 10. Overall Rehab/Functional Prognosis: good  RECOMMENDATIONS: This patient's condition is appropriate for continued rehabilitative care in the following  setting: CIR Patient has agreed to participate in recommended program. Yes Note that insurance prior authorization may be required for reimbursement for recommended care.  Comment: Rehab Admissions Coordinator to follow up.  I have personally performed a face to face diagnostic evaluation, including, but not limited to relevant history and physical exam findings, of this patient and developed relevant assessment and plan.  Additionally, I have reviewed  and concur with the physician assistant's documentation above.   Delice Lesch, MD, ABPMR Lavon Paganini Angiulli, PA-C 11/15/2020

## 2020-11-14 NOTE — Progress Notes (Signed)
Patient's mother visiting at bedside. Belongings brought in include: [place] Cell phone and Games developer

## 2020-11-14 NOTE — Progress Notes (Signed)
Patient with transfer orders to 3W. Report given to 3W RN. All belongings transferred with patient-cell phone, pillow, and tennis shoes.

## 2020-11-14 NOTE — Evaluation (Signed)
Physical Therapy Evaluation Patient Details Name: Dana Henry MRN: [id number] DOB: Jan 14, 1981 Today's Date: 11/14/2020   History of Present Illness  Kaliah Haddaway is a 40 y.o. Caucasian female with PMH of migraine, COVID infection 09/2020 presented to ED for code stroke (neglect, L sided weakness and decreased sensation, visual deficits on L). 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  Thrombectomy 1/26.  Clinical Impression   Pt admitted with above diagnosis. Patient was independent PTA. Lives with spouse and 2 young daughters. Normally works from home. Currently requires 2 person assist for safe ambulation due to LUE weakness and need to support/protect her shoulder (and numerous lines/monitors). She displays mild inattention of left environment.  Pt currently with functional limitations due to the deficits listed below (see PT Problem List). Pt will benefit from skilled PT to increase their independence and safety with mobility to allow discharge to the venue listed below.       Follow Up Recommendations CIR    Equipment Recommendations  Rolling walker with 5" wheels;Other (comment) (Left platform vs hand/walker splint)    Recommendations for Other Services Rehab consult     Precautions / Restrictions Precautions Precautions: Fall Precaution Comments: L inattention Restrictions Weight Bearing Restrictions: No      Mobility  Bed Mobility Overal bed mobility: Needs Assistance Bed Mobility: Supine to Sit;Sit to Supine     Supine to sit: Min assist;+2 for physical assistance;+2 for safety/equipment;HOB elevated Sit to supine: Min assist;+2 for physical assistance;+2 for safety/equipment   General bed mobility comments: assist for sequencing, management of LUE and LLE (back into bed) and line management    Transfers Overall transfer level: Needs assistance Equipment used: 2 person hand held assist Transfers: Sit to/from Stand Sit to Stand: Min assist;From  elevated surface;+2 safety/equipment;+2 physical assistance         General transfer comment: LUE supported by therapist, min A for boost and balance, once from EOB, and once from recliner  Ambulation/Gait Ambulation/Gait assistance: Min assist;+2 physical assistance;+2 safety/equipment Gait Distance (Feet): 17 Feet (5 ft) Assistive device: 2 person hand held assist Gait Pattern/deviations: Step-through pattern;Decreased stride length Gait velocity: decr   General Gait Details: pt being lead to turn left, yet her gaze remained to her right (head and eyes to rt) but able to turn and look for chair on her left with cues  Stairs            Wheelchair Mobility    Modified Rankin (Stroke Patients Only) Modified Rankin (Stroke Patients Only) Pre-Morbid Rankin Score: No symptoms Modified Rankin: Moderately severe disability     Balance Overall balance assessment: Needs assistance Sitting-balance support: Single extremity supported;Feet supported Sitting balance-Leahy Scale: Fair     Standing balance support: Bilateral upper extremity supported Standing balance-Leahy Scale: Poor Standing balance comment: dependent on at least one UE support for balance                             Pertinent Vitals/Pain Pain Assessment: No/denies pain    Home Living Family/patient expects to be discharged to:: Private residence Living Arrangements: Spouse/significant other;Children (6 and 27 yo dtrs) Available Help at Discharge: Family Type of Home: House Home Access: Level entry     Home Layout: Two level;Bed/bath upstairs;Able to live on main level with bedroom/bathroom (her bedroom upstairs; can stay on first floor) Home Equipment: None      Prior Function Level of Independence: Independent  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 decreased as pt with difficulty keeping eyes closed so unsure if appropriate responses)    Cervical / Trunk Assessment Cervical / Trunk Assessment: Normal  Communication   Communication: No difficulties  Cognition Arousal/Alertness: Awake/alert Behavior During Therapy: WFL for tasks assessed/performed Overall Cognitive Status: Within Functional Limits for tasks assessed                                        General Comments General comments (skin integrity, edema, etc.): VSS throughout session on RA    Exercises     Assessment/Plan    PT Assessment Patient needs continued PT services  PT Problem List Decreased strength;Decreased activity tolerance;Decreased balance;Decreased mobility;Decreased knowledge of use of DME       PT Treatment Interventions DME instruction;Gait training;Stair training;Functional mobility training;Therapeutic activities;Therapeutic exercise;Balance training;Neuromuscular re-education;Patient/family education    PT Goals (Current goals can be found in the Care Plan section)  Acute Rehab PT Goals Patient Stated Goal: to be independent again; to move into new home PT Goal Formulation: With patient Time For Goal Achievement: 11/28/20 Potential to Achieve Goals: Good    Frequency Min 4X/week   Barriers to discharge        Co-evaluation PT/OT/SLP Co-Evaluation/Treatment: Yes Reason for Co-Treatment: Complexity of the patient's impairments (multi-system involvement);To address functional/ADL transfers PT goals addressed during session:  Mobility/safety with mobility;Balance OT goals addressed during session: ADL's and self-care;Strengthening/ROM       AM-PAC PT "6 Clicks" Mobility  Outcome Measure Help needed turning from your back to your side while in a flat bed without using bedrails?: A Little Help needed moving from lying on your back to sitting on the side of a flat bed without using bedrails?: A Little Help needed moving to and from a bed to a chair (including a wheelchair)?: A Little Help needed standing up from a chair using your arms (e.g., wheelchair or bedside chair)?: A Little Help needed to walk in hospital room?: A Lot Help needed climbing 3-5 steps with a railing? : Total 6 Click Score: 15    End of Session Equipment Utilized During Treatment: Gait belt Activity Tolerance: Patient tolerated treatment well Patient left: in bed;with call bell/phone within reach;Other (comment) ([place] tech present to perform doppler) Nurse Communication: Mobility status PT Visit Diagnosis: Hemiplegia and hemiparesis Hemiplegia - Right/Left: Left Hemiplegia - dominant/non-dominant: Non-dominant Hemiplegia - caused by: Cerebral infarction    Time: 1829-9371 PT Time Calculation (min) (ACUTE ONLY): 29 min   Charges:   PT Evaluation $PT Eval Moderate Complexity: 1 Mod           Arby Barrette, PT Pager [phone]   Rexanne Mano 11/14/2020, 10:38 AM

## 2020-11-14 NOTE — Progress Notes (Signed)
SLP Cancellation Note  Patient Details Name: Dana Henry MRN: [id number] DOB: [birth year]   Cancelled treatment:        Therapist walked by room and pt crying with mom at bedside. Will defer today and reattempt tomorrow.    Houston Siren 11/14/2020, 4:28 PM

## 2020-11-14 NOTE — Progress Notes (Addendum)
STROKE TEAM PROGRESS NOTE   INTERVAL HISTORY No acute overnight events. Patient evaluated at bedside this morning, no family present in the room.  Patient is awake, alert, oriented to time place and person.  She is following all simple commands.  She states she feels better this morning.  MRI this morning shows multifocal acute ischemic right MCA territory infarct involving the subcortical and deep white matter of the right frontal and parietal lobes, largely watershed in distribution.  MRA head shows recent stent placement within the petrous right ICA, widely patent flow seen distally with the in the right carotid siphon.  Blood pressure well controlled.  Plan is to do TEE study and hypercoagulable panel to find out reason for the stroke.  Hypercoagulable panel is ordered for tomorrow morning and TEE will be scheduled for Monday morning due to lack of any availability tomorrow.  Planning to do TCD bubble study tomorrow.  See below for details of neurological exam.  Transfer order has been placed as patient is stable enough to move out of ICU to progressive unit.  Vitals:   11/14/20 0900 11/14/20 1000 11/14/20 1100 11/14/20 1200  BP: 126/70 (!) 150/75 (!) 154/72   Pulse: 93 79 77   Resp: 20 17 14    Temp:    98.2 F (36.8 C)  TempSrc:    Axillary  SpO2: 97% 98% 99%    CBC:  Recent Labs  Lab 11/13/20 1023 11/13/20 1027  WBC 8.0  --   NEUTROABS 5.9  --   HGB 14.0 14.3  HCT 41.9 42.0  MCV 90.7  --   PLT 228  --    Basic Metabolic Panel:  Recent Labs  Lab 11/13/20 1023 11/13/20 1027 11/14/20 0549  NA 137 139 138  K 3.7 3.6 3.5  CL 104 106 108  CO2 23  --  18*  GLUCOSE 94 90 88  BUN 11 13 9   CREATININE 1.06* 0.90 0.79  CALCIUM 9.3  --  8.4*   Lipid Panel:  Recent Labs  Lab 11/14/20 0549  CHOL 156  TRIG 111  HDL 38*  CHOLHDL 4.1  VLDL 22  LDLCALC 96   HgbA1c:  Recent Labs  Lab 11/14/20 0549  HGBA1C 5.1   Urine Drug Screen: Pending  Hypercoagulable panel:  Pending  IMAGING past 24 hours  CT Head code stroke wo contrast 11/13/2020  IMPRESSION: No acute intracranial hemorrhage. Acute infarction involving the right caudate, adjacent white matter, and superior lentiform nucleus. ASPECTS is 7.  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 Remainder of proximal intracranial vessels are patent.  Perfusion imaging demonstrates no evidence of core infarction despite findings on noncontrast head CT. Calculated territory at risk of 242 mL throughout the right MCA territory as well as MCA/ACA and MCA/PCA watershed territories.  No occlusion or hemodynamically significant stenosis in the neck.  CT head wo contrast 11/13/2020  IMPRESSION: 1. Unchanged acute right basal ganglia infarct. 2. No evidence of hemorrhage or other new intracranial abnormality.  Chest x-ray 11/13/2020  IMPRESSION: No active disease.  MR Brain  11/14/2020  1. 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 No associated hemorrhage or significant mass effect. 2. Otherwise normal brain MRI for age.  MRA Head  11/14/2020  MRA HEAD IMPRESSION:  1. Sequelae of recent stent  placement within the petrous right ICA. Evaluation for implant stenosis limited by susceptibility artifact, however, widely patent flow seen distally within the right carotid siphon. 2. No other hemodynamically significant stenosis or other vascular abnormality within the intracranial circulation. 3. Fetal type origin of both PCAs with overall diminutive vertebrobasilar system.  VAS [place] lower Extremity 11/14/2020  Summary:  BILATERAL:  - No evidence of deep vein thrombosis seen in the lower extremities,  bilaterally.  -No evidence of popliteal cyst,  bilaterally.   PHYSICAL EXAM  General: Well developed, obese young female lying comfortably in bed, NAD HEENT: [place]/AT, MMM, PERRLA Cardiovascular: Regular rate and rhythm  Respiratory: No respiratory distress Abdominal: Soft, nontender, no rigidity Psych: Normal mood and affect Neurological:  General: NAD Mental Status: Alert, oriented, thought content appropriate.  Speech fluent without evidence of aphasia.  Able to follow pull commands without difficulty. Cranial Nerves: II:  Visual fields grossly normal, pupils equal, round, reactive to light and accommodation III,IV, VI: ptosis not present, extra-ocular motions intact bilaterally, right gaze preference resolved V,VII: smile asymmetric, left facial droop,facial light touch sensation decreased on left around 70% as compared to 100% on right VIII: hearing normal bilaterally IX,X: uvula rises symmetrically XI: bilateral shoulder shrug XII: midline tongue extension without atrophy or fasciculations  Motor: Right : Upper extremity   5/5    Left:     Upper extremity   proximal 2/5, bicep 3 / 5, tricep 2/5,                                                                                                  finger grip 2/5  Lower extremity   5/5     Lower extremity   5/5 Tone and bulk:normal tone throughout; no atrophy noted Sensory: Pinprick and light touch decreased by 30% on left side. Deep Tendon Reflexes:  Right: Upper Extremity   Left: Upper extremity   biceps (C-5 to C-6) 2/4   biceps (C-5 to C-6) 2/4 tricep (C7) 2/4    triceps (C7) 2/4 Brachioradialis (C6) 2/4  Brachioradialis (C6) 2/4  Lower Extremity Lower Extremity  quadriceps (L-2 to L-4) 2/4   quadriceps (L-2 to L-4) 2/4 Achilles (S1) 2/4   Achilles (S1) 2/4  Plantars: Right: downgoing   Left: downgoing Cerebellar: normal finger-to-nose  Gait: Deferred    ASSESSMENT/PLAN Dana Henry is a 40 y.o. female with PMH X of migraine, COVID-19 infection in 09/2020  presented to ED for left arm and left leg numbness.  Left arm> weaker than left leg since Sunday and worsening left facial droop and right gaze preference.  CT head showed right caudate and basal ganglia subacute infarct no tPA administered, due to outside of window.  CT head and neck shows right ICA petrous segment occlusion versus high-grade stenosis.  CTP showed large penumbra on right and patient underwent successful thrombectomy.  MRI this morning shows patchy multifocal acute ischemic right MCA territory infarct involving the subcortical and deep white matter of right frontal and parietal lobes, largely watershed in distribution.  Echocardiogram pending today, planning to follow-up with TEE and TCD  bubble study.  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  Code Stroke CT head : No acute intracranial hemorrhage. Acute infarction involving the right caudate, adjacent white matter, and superior lentiform nucleus. 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  CT perfusion: no core, large right penumbra  MRI : Patchy multifocal acute ischemic right MCA territory infarcts involving the subcortical and deep white matter of the right frontal and parietal lobes, largely watershed in distribution.  MRA : Sequelae of recent stent placement within the petrous right ICA. widely patent flow seen distally within the right carotid siphon.  2D Echo: Pending  Doppler Lower extremities [place]: No evidence of deep vein thrombosis   TCD bubble study: Pending tomorrow  TEE: Pending on 11/18/2020  LDL 96  HgbA1c 5.1  Hypercoagulable and autoimmune labs pending  VTE prophylaxis - Lovenox 40 mg  No antithrombotic prior to admission, now on aspirin 81 mg daily and Brilinta (ticagrelor) 90 mg bid.   Therapy recommendations: CIR  Disposition:  Pending  Hypertension  Home meds: None  Off Cleviprex now . SBP <160 s/p procedure . Long-term BP goal normotensive  Hyperlipidemia  Home meds: None  LDL 96, goal < 70  Start lipitor 40  Continue statin at discharge  Other Stroke Risk Factors  Obesity: Patient will be advised to exercise and lose weight.  Past COVID-19 infection on 09/2020  Migraine   Hospital day # 1  ATTENDING NOTE: I reviewed above note and agree with the assessment and plan. Pt was seen and examined.   40 year old female with history of migraine and Covid infection in 09/2020 admitted for left-sided weakness, right gaze, left facial droop. CT found subacute right caudate and CR/BG infarcts. CT head and neck showed right petrous ICA occlusion with distal reconstitution. CTP large penumbra. Stat post EVD with right ICA petrous segment stenting. Currently on aspirin and Brilinta. 2D echo pending, no DVT, LDL 96, A1c 5.1. UDS pending  Awake alert, orientated x 3. No aphasia or dysarthria, following all simple commands. No gaze palsy, right gaze preference resolved. Visual field full. PERRL. Left facial droop. Tongue midline. Left UE proximal 2/5, bicep 3/5, tricep 2/5, finger grip 2/5. Left LE 4+/5. Right UE and LE 5/5. Sensation decreased on the left with 70% comparing with right. Right FTN intact. Gait not tested.   Stroke etiology unclear, will continue stroke work-up including TEE, TCD bubble study, hypercoagulable and autoimmune work-up. Continue aspirin Brilinta. Add Lipitor 40 for LDL above the goal. PT/OT recommend CIR.  [name], MD PhD Stroke Neurology 11/14/2020 7:45 PM  This patient is critically ill due to right MCA stroke, status post stenting and at significant risk of neurological worsening, death form recurrent stroke, stent occlusion, hemorrhagic conversion, seizure. This patient's care requires constant monitoring of vital signs, hemodynamics, respiratory and cardiac monitoring, review  of multiple databases, neurological assessment, discussion with family, other specialists and medical decision making of high complexity. I spent 35 minutes of neurocritical care time in the care of this patient.   To contact Stroke Continuity provider, please refer to [email or web address] After hours, contact General Neurology

## 2020-11-14 NOTE — Progress Notes (Signed)
  Echocardiogram 2D Echocardiogram has been performed.  Dana Henry 11/14/2020, 11:23 AM

## 2020-11-14 NOTE — Progress Notes (Signed)
Referring Physician(s): Marvel Plan  Supervising Physician: Julieanne Cotton  Patient Status:  [facility] - In-pt  Chief Complaint:  Acute CVA  Brief History:  Acute CVA s/p cerebral arteriogram with emergent stent assisted angioplasty of right ICA petrous stenosis achieving a TICI 3 revascularization via right femoral approach yesterday by Dr. Corliss Skains.  Subjective:  Sitting up in chair after working with PT. No complaints.  Allergies: Sulfa antibiotics  Medications: Prior to Admission medications   Medication Sig Start Date End Date Taking? Authorizing Provider  ibuprofen (ADVIL) 200 MG tablet Take 400 mg by mouth every 6 (six) hours as needed for mild pain or headache.   Yes [provider]  Multiple Vitamins-Minerals (MULTIVITAMIN WITH MINERALS) tablet Take 1 tablet by mouth daily.   Yes [provider]     Vital Signs: BP 126/70   Pulse 93   Temp 99.1 F (37.3 C) (Axillary)   Resp 20   LMP 11/05/2020   SpO2 97%   Physical Exam Vitals reviewed.  Cardiovascular:     Rate and Rhythm: Normal rate.  Pulmonary:     Effort: Pulmonary effort is normal. No respiratory distress.  Neurological:     Mental Status: She is alert and oriented to person, place, and time.     Comments: Alert, awake, and oriented x3. Speech and comprehension intact. PERRL bilaterally. EOMs intact bilaterally Left facial droop. Tongue midline. Can spontaneously move right side and LLE. Unable to move LUE. Right femoral puncture site soft without active bleeding or hematoma.  Psychiatric:        Mood and Affect: Mood normal.        Behavior: Behavior normal.        Thought Content: Thought content normal.        Judgment: Judgment normal.     Imaging: CT ANGIO HEAD W OR WO CONTRAST  Addendum Date: 11/13/2020   ADDENDUM REPORT: 11/13/2020 11:16 ADDENDUM: Transcription error in the impression. Should state occlusion or high-grade stenosis right petrous ICA.  Electronically Signed   By: Guadlupe Spanish M.D.   On: 11/13/2020 11:16   Result Date: 11/13/2020 CLINICAL DATA:  Code stroke follow-up, noncontrast CT positive for acute stroke EXAM: CT ANGIOGRAPHY HEAD AND NECK CT PERFUSION BRAIN TECHNIQUE: Multidetector CT imaging of the head and neck was performed using the standard protocol during bolus administration of intravenous contrast. Multiplanar CT image reconstructions and MIPs were obtained to evaluate the vascular anatomy. Carotid stenosis measurements (when applicable) are obtained utilizing NASCET criteria, using the distal internal carotid diameter as the denominator. Multiphase CT imaging of the brain was performed following IV bolus contrast injection. Subsequent parametric perfusion maps were calculated using RAPID software. CONTRAST:  OMNIPAQUE IOHEXOL 350 MG/ML SOLN COMPARISON:  None. FINDINGS: CTA NECK FINDINGS Aortic arch: Great vessel origins are patent. 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 Right carotid system: Patent. No measurable stenosis at the ICA origin. Left carotid system: Patent. No measurable stenosis at the ICA origin Vertebral arteries: Patent. Left vertebral artery is dominant. No measurable stenosis or evidence of dissection. Skeleton: No significant osseous abnormality Other neck: No mass or adenopathy. Upper chest: No apical lung mass. Review of the MIP images confirms the above findings CTA HEAD FINDINGS Anterior circulation: There is loss of right ICA enhancement within the carotid canal with reconstitution distally. Cavernous and supraclinoid portions are patent. Intracranial left ICA is patent. Bilateral anterior and middle cerebral arteries are patent. Posterior circulation: Intracranial  vertebral arteries are patent and become diminutive after PICA origins. Basilar artery is patent and small in caliber. This is due to bilateral fetal origin of posterior cerebral arteries. Venous  sinuses: As permitted by contrast timing, patent. 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 Remainder of proximal intracranial vessels are patent. Perfusion imaging demonstrates no evidence of core infarction despite findings on noncontrast head CT. Calculated territory at risk of 242 mL throughout the right MCA territory as well as MCA/ACA and MCA/PCA watershed territories. No occlusion or hemodynamically significant stenosis in the neck. Electronically Signed: By: Guadlupe SpanishPraneil  Patel M.D. On: 11/13/2020 11:05   CT HEAD WO CONTRAST  Result Date: 11/13/2020 CLINICAL DATA:  Stroke follow-up. Status post emergent stent assisted angioplasty of right ICA petrous segment stenosis. EXAM: CT HEAD WITHOUT CONTRAST TECHNIQUE: Contiguous axial images were obtained from the base of the skull through the vertex without intravenous contrast. COMPARISON:  Head CT and CTA earlier today FINDINGS: Brain: Hypodensity is again seen in the right basal ganglia and adjacent deep white matter corresponding to the previously described acute infarct. No significant infarct extension, new infarct, intracranial hemorrhage, mass, midline shift, or extra-axial fluid collection is identified. The ventricles and sulci are normal. Vascular: Interval right petrous ICA stenting. Skull: No fracture or suspicious osseous lesion. Sinuses/Orbits: Mild right maxillary sinus mucosal thickening. Clear mastoid air cells. Unremarkable orbits. Other: None. IMPRESSION: 1. Unchanged acute right basal ganglia infarct. 2. No evidence of hemorrhage or other new intracranial abnormality. Electronically Signed   By: Sebastian AcheAllen  Grady M.D.   On: 11/13/2020 17:27   CT ANGIO NECK W OR WO CONTRAST  Addendum Date:  11/13/2020   ADDENDUM REPORT: 11/13/2020 11:16 ADDENDUM: Transcription error in the impression. Should state occlusion or high-grade stenosis right petrous ICA. Electronically Signed   By: Guadlupe SpanishPraneil  Patel M.D.   On: 11/13/2020 11:16   Result Date: 11/13/2020 CLINICAL DATA:  Code stroke follow-up, noncontrast CT positive for acute stroke EXAM: CT ANGIOGRAPHY HEAD AND NECK CT PERFUSION BRAIN TECHNIQUE: Multidetector CT imaging of the head and neck was performed using the standard protocol during bolus administration of intravenous contrast. Multiplanar CT image reconstructions and MIPs were obtained to evaluate the vascular anatomy. Carotid stenosis measurements (when applicable) are obtained utilizing NASCET criteria, using the distal internal carotid diameter as the denominator. Multiphase CT imaging of the brain was performed following IV bolus contrast injection. Subsequent parametric perfusion maps were calculated using RAPID software. CONTRAST:  100mL OMNIPAQUE IOHEXOL 350 MG/ML SOLN COMPARISON:  None. FINDINGS: CTA NECK FINDINGS Aortic arch: Great vessel origins are patent. 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 Right carotid system: Patent. No measurable stenosis at the ICA origin. Left carotid system: Patent. No measurable stenosis at the ICA origin Vertebral arteries: Patent. Left vertebral artery is dominant. No measurable stenosis or evidence of dissection. Skeleton: No significant osseous abnormality Other neck: No mass or adenopathy. Upper chest: No apical lung mass. Review of the MIP images confirms the above findings CTA HEAD FINDINGS Anterior circulation: There is loss of right ICA enhancement within the carotid canal with reconstitution distally. Cavernous and supraclinoid portions are patent. Intracranial left ICA is patent. Bilateral anterior and middle cerebral arteries are patent. Posterior circulation: Intracranial vertebral arteries are patent and become  diminutive after PICA origins. Basilar artery is patent and small in caliber. This is due to bilateral fetal origin of posterior cerebral arteries. Venous sinuses: As permitted by contrast timing, patent. 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 Remainder of proximal intracranial vessels are patent. Perfusion imaging demonstrates no evidence of core infarction despite findings on noncontrast head CT. Calculated territory at risk of 242 mL throughout the right MCA territory as well as MCA/ACA and MCA/PCA watershed territories. No occlusion or hemodynamically significant stenosis in the neck. Electronically Signed: By: [name] M.D. On: 11/13/2020 11:05   MR ANGIO HEAD WO CONTRAST  Result Date: 11/14/2020 CLINICAL DATA:  Follow-up examination for acute stroke. History of emergent stent assisted angioplasty of right ICA petrous stenosis. EXAM: MRI HEAD WITHOUT CONTRAST MRA HEAD WITHOUT CONTRAST TECHNIQUE: Multiplanar, multiecho pulse sequences of the brain and surrounding structures were obtained without intravenous contrast. Angiographic images of the head were obtained using MRA technique without contrast. COMPARISON:  Prior CTs from 11/13/2020. FINDINGS: MRI HEAD FINDINGS Brain: Examination degraded by motion artifact. Cerebral volume within normal limits. No significant cerebral white matter disease. Patchy multifocal ischemic infarcts seen involving the subcortical and deep white matter of the right frontal and parietal lobes, largely watershed in distribution. Largest area of ischemia measures up to 3.1 cm at the right corona radiata just above the right basal ganglia. No associated hemorrhage or significant mass effect. No other evidence for acute  or subacute ischemia. Gray-white matter differentiation otherwise maintained. No encephalomalacia to suggest chronic cortical infarction elsewhere within the brain. No evidence for acute or chronic intracranial hemorrhage. No mass lesion, midline shift or mass effect. No hydrocephalus or extra-axial fluid collection. Pituitary gland suprasellar region normal. Midline structures intact. Vascular: Major intracranial vascular flow voids are maintained. Diminutive vertebrobasilar system noted. Skull and upper cervical spine: Craniocervical junction within normal limits. Upper cervical spine normal. Diffusely decreased T1 signal intensity seen within the visualized bone marrow, nonspecific, but most commonly related to anemia, smoking, or obesity. No focal marrow replacing lesion. No scalp soft tissue abnormality. Sinuses/Orbits: Globes and orbital soft tissues within normal limits. Mild scattered mucosal thickening noted within the paranasal sinuses. Right maxillary sinus retention cyst noted. Trace right mastoid effusion noted, of doubtful significance. Inner ear structures grossly normal. Other: None. MRA HEAD FINDINGS ANTERIOR CIRCULATION: Visualized distal cervical segments of the internal carotid arteries are patent with antegrade flow. Flow related signal loss seen within the petrous right ICA consistent with recent stent placement. Evaluation for implant stenosis limited due to susceptibility artifact. Widely plate and flow is seen distally within the cavernous and supraclinoid right ICA. The contralateral petrous, cavernous, and supraclinoid left ICAs widely patent. A1 segments widely patent. Normal anterior communicating artery complex. Anterior cerebral arteries patent to their distal aspects without stenosis. No M1 stenosis or occlusion. Negative MCA bifurcations. Distal MCA branches well perfused and symmetric. POSTERIOR CIRCULATION: Left vertebral artery dominant and widely patent to the vertebrobasilar  junction. Hypoplastic right vertebral artery largely terminates in PICA. Right PICA itself is patent. Left PICA origin patent and normal as well. Basilar diffusely diminutive but remains widely patent. Superior cerebellar arteries patent bilaterally. Predominant fetal type origin of both PCAs supplied via robust bilateral posterior communicating arteries. PCAs remain widely patent to their distal aspects. No  intracranial aneurysm. IMPRESSION: MRI HEAD IMPRESSION: 1. 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 No associated hemorrhage or significant mass effect. 2. Otherwise normal brain MRI for age. MRA HEAD IMPRESSION: 1. Sequelae of recent stent placement within the petrous right ICA. Evaluation for implant stenosis limited by susceptibility artifact, however, widely patent flow seen distally within the right carotid siphon. 2. No other hemodynamically significant stenosis or other vascular abnormality within the intracranial circulation. 3. Fetal type origin of both PCAs with overall diminutive vertebrobasilar system. Electronically Signed   By: Jeannine Boga M.D.   On: 11/14/2020 04:59   MR BRAIN WO CONTRAST  Result Date: 11/14/2020 CLINICAL DATA:  Follow-up examination for acute stroke. History of emergent stent assisted angioplasty of right ICA petrous stenosis. EXAM: MRI HEAD WITHOUT CONTRAST MRA HEAD WITHOUT CONTRAST TECHNIQUE: Multiplanar, multiecho pulse sequences of the brain and surrounding structures were obtained without intravenous contrast. Angiographic images of the head were obtained using MRA technique without contrast. COMPARISON:  Prior CTs from 11/13/2020. FINDINGS: MRI HEAD FINDINGS Brain: Examination degraded by motion artifact. Cerebral volume within normal limits. No significant cerebral white matter disease. Patchy multifocal ischemic  infarcts seen involving the subcortical and deep white matter of the right frontal and parietal lobes, largely watershed in distribution. Largest area of ischemia measures up to 3.1 cm at the right corona radiata just above the right basal ganglia. No associated hemorrhage or significant mass effect. No other evidence for acute or subacute ischemia. Gray-white matter differentiation otherwise maintained. No encephalomalacia to suggest chronic cortical infarction elsewhere within the brain. No evidence for acute or chronic intracranial hemorrhage. No mass lesion, midline shift or mass effect. No hydrocephalus or extra-axial fluid collection. Pituitary gland suprasellar region normal. Midline structures intact. Vascular: Major intracranial vascular flow voids are maintained. Diminutive vertebrobasilar system noted. Skull and upper cervical spine: Craniocervical junction within normal limits. Upper cervical spine normal. Diffusely decreased T1 signal intensity seen within the visualized bone marrow, nonspecific, but most commonly related to anemia, smoking, or obesity. No focal marrow replacing lesion. No scalp soft tissue abnormality. Sinuses/Orbits: Globes and orbital soft tissues within normal limits. Mild scattered mucosal thickening noted within the paranasal sinuses. Right maxillary sinus retention cyst noted. Trace right mastoid effusion noted, of doubtful significance. Inner ear structures grossly normal. Other: None. MRA HEAD FINDINGS ANTERIOR CIRCULATION: Visualized distal cervical segments of the internal carotid arteries are patent with antegrade flow. Flow related signal loss seen within the petrous right ICA consistent with recent stent placement. Evaluation for implant stenosis limited due to susceptibility artifact. Widely plate and flow is seen distally within the cavernous and supraclinoid right ICA. The contralateral petrous, cavernous, and supraclinoid left ICAs widely patent. A1 segments widely  patent. Normal anterior communicating artery complex. Anterior cerebral arteries patent to their distal aspects without stenosis. No M1 stenosis or occlusion. Negative MCA bifurcations. Distal MCA branches well perfused and symmetric. POSTERIOR CIRCULATION: Left vertebral artery dominant and widely patent to the vertebrobasilar junction. Hypoplastic right vertebral artery largely terminates in PICA. Right PICA itself is patent. Left PICA origin patent and normal as well. Basilar diffusely diminutive but remains widely patent. Superior cerebellar arteries patent bilaterally. Predominant fetal type origin of both PCAs supplied via robust bilateral posterior communicating arteries. PCAs remain widely patent to their distal aspects. No intracranial aneurysm. IMPRESSION: MRI HEAD IMPRESSION: 1. Patchy multifocal acute ischemic right MCA territory infarcts  involving the subcortical and deep white matter of the right frontal and parietal lobes, largely watershed in distribution. No associated hemorrhage or significant mass effect. 2. Otherwise normal brain MRI for age. MRA HEAD IMPRESSION: 1. Sequelae of recent stent placement within the petrous right ICA. Evaluation for implant stenosis limited by susceptibility artifact, however, widely patent flow seen distally within the right carotid siphon. 2. No other hemodynamically significant stenosis or other vascular abnormality within the intracranial circulation. 3. Fetal type origin of both PCAs with overall diminutive vertebrobasilar system. Electronically Signed   By: Jeannine Boga M.D.   On: 11/14/2020 04:59   CT CEREBRAL PERFUSION W CONTRAST  Addendum Date: 11/13/2020   ADDENDUM REPORT: 11/13/2020 11:16 ADDENDUM: Transcription error in the impression. Should state occlusion or high-grade stenosis right petrous ICA. Electronically Signed   By: [name] M.D.   On: 11/13/2020 11:16   Result Date: 11/13/2020 CLINICAL DATA:  Code stroke follow-up,  noncontrast CT positive for acute stroke EXAM: CT ANGIOGRAPHY HEAD AND NECK CT PERFUSION BRAIN TECHNIQUE: Multidetector CT imaging of the head and neck was performed using the standard protocol during bolus administration of intravenous contrast. Multiplanar CT image reconstructions and MIPs were obtained to evaluate the vascular anatomy. Carotid stenosis measurements (when applicable) are obtained utilizing NASCET criteria, using the distal internal carotid diameter as the denominator. Multiphase CT imaging of the brain was performed following IV bolus contrast injection. Subsequent parametric perfusion maps were calculated using RAPID software. CONTRAST:  135mL OMNIPAQUE IOHEXOL 350 MG/ML SOLN COMPARISON:  None. FINDINGS: CTA NECK FINDINGS Aortic arch: Great vessel origins are patent. 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 Right carotid system: Patent. No measurable stenosis at the ICA origin. Left carotid system: Patent. No measurable stenosis at the ICA origin Vertebral arteries: Patent. Left vertebral artery is dominant. No measurable stenosis or evidence of dissection. Skeleton: No significant osseous abnormality Other neck: No mass or adenopathy. Upper chest: No apical lung mass. Review of the MIP images confirms the above findings CTA HEAD FINDINGS Anterior circulation: There is loss of right ICA enhancement within the carotid canal with reconstitution distally. Cavernous and supraclinoid portions are patent. Intracranial left ICA is patent. Bilateral anterior and middle cerebral arteries are patent. Posterior circulation: Intracranial vertebral arteries are patent and become diminutive after PICA origins. Basilar artery is patent and small in caliber. This is due to bilateral fetal origin of posterior cerebral arteries. Venous sinuses: As permitted by contrast timing, patent. 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 Remainder of proximal intracranial vessels are patent. Perfusion imaging demonstrates no evidence of core infarction despite findings on noncontrast head CT. Calculated territory at risk of 242 mL throughout the right MCA territory as well as MCA/ACA and MCA/PCA watershed territories. No occlusion or hemodynamically significant stenosis in the neck. Electronically Signed: By: [name] M.D. On: 11/13/2020 11:05   DG Chest Port 1 View  Result Date: 11/13/2020 CLINICAL DATA:  40 year old female with cough. EXAM: PORTABLE CHEST 1 VIEW COMPARISON:  None. FINDINGS: No focal consolidation, pleural effusion or pneumothorax. The cardiac silhouette is within limits. No acute osseous pathology. IMPRESSION: No active disease. Electronically Signed   By: Anner Crete M.D.   On: 11/13/2020 18:47   CT HEAD CODE STROKE  WO CONTRAST  Result Date: 11/13/2020 CLINICAL DATA:  Code stroke.  Right-sided gaze, facial droop EXAM: CT HEAD WITHOUT CONTRAST TECHNIQUE: Contiguous axial images were obtained from the base of the skull through the vertex without intravenous contrast. COMPARISON:  None. FINDINGS: Brain: There is no acute intracranial hemorrhage. Hypoattenuation is present involving the right caudate, adjacent white matter, and superior lentiform nucleus. No significant mass effect. No hydrocephalus. No extra-axial collection. Vascular: No hyperdense vessel. Skull: Unremarkable. Sinuses/Orbits: No acute abnormality Other: Mastoid air cells are clear. ASPECTS [facility] Stroke Program Early CT Score) - Ganglionic level infarction (caudate, lentiform nuclei, internal capsule, insula, M1-M3 cortex): 4 - Supraganglionic infarction (M4-M6 cortex): 3 Total score (0-10 with 10 being normal): 7 IMPRESSION: No acute  intracranial hemorrhage. Acute infarction involving the right caudate, adjacent white matter, and superior lentiform nucleus. ASPECTS is 7. These results were called by telephone at the time of interpretation on 11/13/2020 at 10:34 am to provider Dr. Erlinda Hong, who verbally acknowledged these results. Electronically Signed   By: [name] M.D.   On: 11/13/2020 10:38    Labs:  CBC: Recent Labs    03/21/20 1050 11/13/20 1023 11/13/20 1027  WBC 4.4 8.0  --   HGB 12.8 14.0 14.3  HCT 37.5 41.9 42.0  PLT 220.0 228  --     COAGS: Recent Labs    11/13/20 1023  INR 1.1  APTT 28    BMP: Recent Labs    03/21/20 1050 11/13/20 1023 11/13/20 1027 11/14/20 0549  NA 138 137 139 138  K 4.3 3.7 3.6 3.5  CL 107 104 106 108  CO2 27 23  --  18*  GLUCOSE 83 94 90 88  BUN 14 11 13 9   CALCIUM 9.1 9.3  --  8.4*  CREATININE 0.84 1.06* 0.90 0.79  GFRNONAA  --  >60  --  >60    LIVER FUNCTION TESTS: Recent Labs    03/21/20 1050 11/13/20 1023  BILITOT 0.5 1.3*  AST 17 27  ALT 18 37  ALKPHOS 32* 33*  PROT 6.7 7.4  ALBUMIN 4.5 4.3    Assessment and Plan:  Acute CVA s/p cerebral arteriogram.  S/P emergent stent assisted angioplasty of right ICA petrous stenosis achieving a TICI 3 revascularization via right femoral approach yesterday by Dr. Estanislado Pandy.  Continue Brilinta and 81 mg ASA.  Will arrange f/u in about 4 weeks and call patient with details.  Electronically Signed: Murrell Redden, PA-C 11/14/2020, 11:10 AM    I spent a total of 15 Minutes at the the patient's bedside AND on the patient's hospital floor or unit, greater than 50% of which was counseling/coordinating care for f/u cerebral intervention.

## 2020-11-14 NOTE — Evaluation (Signed)
Occupational Therapy Evaluation Patient Details Name: Dana Henry MRN: [id number] DOB: Jul 31, 1981 Today's Date: 11/14/2020    History of Present Illness Dana Henry is a 40 y.o. Caucasian female with PMH of migraine, COVID infection 09/2020 presented to ED for code stroke (neglect, L sided weakness and decreased sensation, visual deficits on L). 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  Thrombectomy 1/26.   Clinical Impression   Pt is independent at baseline. She works full time remotely for [employer], enjoys walks. Today Pt presents with deficits in L side (LUE>LLE), attention (vision deficits vs attention?), balance, activity tolerance. Overall LUE 2-/5. At least mod A for UB ADL and max A for LB ADL at this time. Pt will need CIR level therapy post-acute to maximize safety and independence in ADL and functional transfers - compensatory strategies for BUE activities. During in room mobility at min A +2 LUE requiring support and cues for attention to L visual field. OT will continue to follow acutely.     Follow Up Recommendations  CIR    Equipment Recommendations  3 in 1 bedside commode;Other (comment) (defer to next venue of care)    Recommendations for Other Services Rehab consult     Precautions / Restrictions Precautions Precautions: Fall Precaution Comments: L inattention Restrictions Weight Bearing Restrictions: No      Mobility Bed Mobility Overal bed mobility: Needs Assistance Bed Mobility: Supine to Sit;Sit to Supine     Supine to sit: Min assist;+2 for physical assistance;+2 for safety/equipment;HOB elevated Sit to supine: Min assist;+2 for physical assistance;+2 for safety/equipment   General bed mobility comments: assist for sequencing, management of LUE and LUE (back into bed) and line management    Transfers Overall transfer level: Needs assistance Equipment used: 2 person hand held assist Transfers: Sit to/from Stand Sit to  Stand: Min assist;From elevated surface;+2 safety/equipment;+2 physical assistance         General transfer comment: LUE supported by therapist, min A for boost and balance, once from EOB, and once from recliner    Balance Overall balance assessment: Needs assistance Sitting-balance support: Single extremity supported;Feet supported Sitting balance-Leahy Scale: Fair     Standing balance support: Bilateral upper extremity supported Standing balance-Leahy Scale: Poor Standing balance comment: dependent on at least one UE support for balance                           ADL either performed or assessed with clinical judgement   ADL Overall ADL's : Needs assistance/impaired Eating/Feeding: NPO   Grooming: Minimal assistance;Sitting Grooming Details (indicate cue type and reason): requires assist for BUE tasks, able to use RUE normally Upper Body Bathing: Moderate assistance;Sitting   Lower Body Bathing: Maximal assistance   Upper Body Dressing : Maximal assistance   Lower Body Dressing: Total assistance   Toilet Transfer: Moderate assistance;+2 for physical assistance;+2 for safety/equipment;Stand-pivot;BSC Toilet Transfer Details (indicate cue type and reason): 2 person HHA Toileting- Clothing Manipulation and Hygiene: Moderate assistance;+2 for physical assistance;+2 for safety/equipment;Sit to/from stand       Functional mobility during ADLs: +2 for physical assistance;+2 for safety/equipment;Minimal assistance (2 person HHA) General ADL Comments: L inattention     Vision Patient Visual Report: Other (comment) (L inattention vs LH) Vision Assessment?: Vision impaired- to be further tested in functional context;Yes Eye Alignment: Within Functional Limits Ocular Range of Motion: Within Functional Limits;Other (comment) (requires cues to track past midline) Alignment/Gaze Preference: Gaze right Tracking/Visual Pursuits:  Able to track stimulus in all quads without  difficulty Visual Fields: Impaired-to be further tested in functional context Additional Comments: evaluation limited by ultasound, will need further evaluation     Perception     Praxis      Pertinent Vitals/Pain Pain Assessment: No/denies pain     Hand Dominance Right   Extremity/Trunk Assessment Upper Extremity Assessment Upper Extremity Assessment: LUE deficits/detail LUE Deficits / Details: grossly 2/5 some digit movement, did not attempt wrist (A line still in place), elbow 2-/5 LUE Sensation: decreased light touch LUE Coordination: decreased fine motor;decreased gross motor   Lower Extremity Assessment Lower Extremity Assessment: Defer to PT evaluation   Cervical / Trunk Assessment Cervical / Trunk Assessment: Normal   Communication Communication Communication: No difficulties   Cognition Arousal/Alertness: Awake/alert Behavior During Therapy: WFL for tasks assessed/performed Overall Cognitive Status: Within Functional Limits for tasks assessed                                     General Comments  VSS throughout session on RA    Exercises     Shoulder Instructions      Home Living Family/patient expects to be discharged to:: Private residence Living Arrangements: Spouse/significant other;Children (6 and 25 yo dtrs) Available Help at Discharge: Family Type of Home: House Home Access: Level entry     Home Layout: Two level;Bed/bath upstairs;Able to live on main level with bedroom/bathroom (her bedroom upstairs; can stay on first floor) Alternate Level Stairs-Number of Steps: 13 Alternate Level Stairs-Rails: Right Bathroom Shower/Tub: Occupational psychologist: Standard     Home Equipment: None          Prior Functioning/Environment Level of Independence: Independent        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; supply chain        OT Problem List: Decreased strength;Decreased range of motion;Decreased activity tolerance;Impaired  balance (sitting and/or standing);Impaired vision/perception;Decreased coordination;Decreased knowledge of use of DME or AE;Decreased knowledge of precautions;Impaired sensation;Impaired UE functional use      OT Treatment/Interventions: Self-care/ADL training;Neuromuscular education;Therapeutic exercise;DME and/or AE instruction;Manual therapy;Therapeutic activities;Visual/perceptual remediation/compensation;Patient/family education;Balance training    OT Goals(Current goals can be found in the care plan section) Acute Rehab OT Goals Patient Stated Goal: to be independent again; to move into new home OT Goal Formulation: With patient Time For Goal Achievement: 11/28/20 Potential to Achieve Goals: Good  OT Frequency: Min 2X/week   Barriers to D/C:            Co-evaluation PT/OT/SLP Co-Evaluation/Treatment: Yes Reason for Co-Treatment: Complexity of the patient's impairments (multi-system involvement);For patient/therapist safety;To address functional/ADL transfers PT goals addressed during session: Mobility/safety with mobility;Balance;Strengthening/ROM OT goals addressed during session: ADL's and self-care;Strengthening/ROM      AM-PAC OT "6 Clicks" Daily Activity     Outcome Measure Help from another person eating meals?: Total (NPO) Help from another person taking care of personal grooming?: A Little Help from another person toileting, which includes using toliet, bedpan, or urinal?: A Lot Help from another person bathing (including washing, rinsing, drying)?: A Lot Help from another person to put on and taking off regular upper body clothing?: A Lot Help from another person to put on and taking off regular lower body clothing?: A Lot 6 Click Score: 12   End of Session Equipment Utilized During Treatment: Gait belt Nurse Communication: Mobility status  Activity Tolerance: Patient tolerated treatment well Patient left:  in bed;with call bell/phone within reach;Other (comment)  (ultrasound in room)  OT Visit Diagnosis: Unsteadiness on feet (R26.81);Other abnormalities of gait and mobility (R26.89);Muscle weakness (generalized) (M62.81);Hemiplegia and hemiparesis Hemiplegia - Right/Left: Left Hemiplegia - dominant/non-dominant: Non-Dominant Hemiplegia - caused by: Nontraumatic intracerebral hemorrhage                Time: 0857-0926 OT Time Calculation (min): 29 min Charges:  OT General Charges $OT Visit: 1 Visit OT Evaluation $OT Eval Moderate Complexity: [place] OTR/L Acute Rehabilitation Services Pager: [phone] Office: [place] 11/14/2020, 10:18 AM

## 2020-11-14 NOTE — Progress Notes (Signed)
Bilateral lower extremity venous study completed.      Please see CV Proc for preliminary results.   Dana Henry, RVT  

## 2020-11-15 ENCOUNTER — Inpatient Hospital Stay (HOSPITAL_COMMUNITY): Payer: BC Managed Care – PPO

## 2020-11-15 DIAGNOSIS — G43909 Migraine, unspecified, not intractable, without status migrainosus: Secondary | ICD-10-CM | POA: Diagnosis not present

## 2020-11-15 DIAGNOSIS — I69391 Dysphagia following cerebral infarction: Secondary | ICD-10-CM

## 2020-11-15 DIAGNOSIS — I6521 Occlusion and stenosis of right carotid artery: Secondary | ICD-10-CM | POA: Diagnosis not present

## 2020-11-15 DIAGNOSIS — I639 Cerebral infarction, unspecified: Secondary | ICD-10-CM

## 2020-11-15 DIAGNOSIS — Q211 Atrial septal defect: Secondary | ICD-10-CM

## 2020-11-15 DIAGNOSIS — D62 Acute posthemorrhagic anemia: Secondary | ICD-10-CM

## 2020-11-15 LAB — BASIC METABOLIC PANEL
Anion gap: 11 (ref 5–15)
BUN: 10 mg/dL (ref 6–20)
CO2: 20 mmol/L — ABNORMAL LOW (ref 22–32)
Calcium: 8.4 mg/dL — ABNORMAL LOW (ref 8.9–10.3)
Chloride: 109 mmol/L (ref 98–111)
Creatinine, Ser: 0.8 mg/dL (ref 0.44–1.00)
GFR, Estimated: >60 mL/min (ref >60)
Glucose, Bld: 85 mg/dL (ref 70–99)
Potassium: 3.5 mmol/L (ref 3.5–5.1)
Sodium: 140 mmol/L (ref 135–145)

## 2020-11-15 LAB — TSH: TSH: 1.706 u[IU]/mL (ref 0.350–4.500)

## 2020-11-15 LAB — VITAMIN B12: Vitamin B-12: 404 pg/mL (ref 180–914)

## 2020-11-15 LAB — CBC
HCT: 31.6 % — ABNORMAL LOW (ref 36.0–46.0)
Hemoglobin: 10.5 g/dL — ABNORMAL LOW (ref 12.0–15.0)
MCH: 29.9 pg (ref 26.0–34.0)
MCHC: 33.2 g/dL (ref 30.0–36.0)
MCV: 90 fL (ref 80.0–100.0)
Platelets: 207 10*3/uL (ref 150–400)
RBC: 3.51 MIL/uL — ABNORMAL LOW (ref 3.87–5.11)
RDW: 12.7 % (ref 11.5–15.5)
WBC: 6.2 10*3/uL (ref 4.0–10.5)
nRBC: 0 % (ref 0.0–0.2)

## 2020-11-15 LAB — SEDIMENTATION RATE: Sed Rate: 14 mm/hr (ref 0–22)

## 2020-11-15 LAB — RAPID URINE DRUG SCREEN, HOSP PERFORMED
Amphetamines: NOT DETECTED
Barbiturates: NOT DETECTED
Benzodiazepines: NOT DETECTED
Cocaine: NOT DETECTED
Opiates: NOT DETECTED
Tetrahydrocannabinol: POSITIVE — AB

## 2020-11-15 LAB — C-REACTIVE PROTEIN: CRP: 0.6 mg/dL (ref <1.0)

## 2020-11-15 LAB — RPR: RPR Ser Ql: NONREACTIVE

## 2020-11-15 MED ORDER — PROMETHAZINE HCL 25 MG/ML IJ SOLN
12.5000 mg | Freq: Once | INTRAMUSCULAR | Status: AC
  Administered 2020-11-15: 12.5 mg via INTRAVENOUS
  Filled 2020-11-15: qty 1

## 2020-11-15 MED ORDER — POLYVINYL ALCOHOL 1.4 % OP SOLN
1.0000 [drp] | OPHTHALMIC | Status: DC | PRN
  Filled 2020-11-15: qty 15

## 2020-11-15 MED ORDER — ZOLPIDEM TARTRATE 5 MG PO TABS: 5.0000 mg | ORAL_TABLET | Freq: Every evening | ORAL | Status: DC | PRN

## 2020-11-15 MED ORDER — ZOLPIDEM TARTRATE 5 MG PO TABS
5.0000 mg | ORAL_TABLET | Freq: Every evening | ORAL | Status: DC | PRN
  Administered 2020-11-15 – 2020-11-17 (×2): 5 mg via ORAL
  Filled 2020-11-15 (×2): qty 1

## 2020-11-15 MED ORDER — ZOLPIDEM TARTRATE 5 MG PO TABS
5.0000 mg | ORAL_TABLET | Freq: Once | ORAL | Status: AC
  Administered 2020-11-15: 5 mg via ORAL
  Filled 2020-11-15: qty 1

## 2020-11-15 NOTE — Progress Notes (Signed)
Referring Physician(s): Code stroke- Roda Shutters, Jindong (neuorology)  Supervising Physician: Julieanne Cotton  Patient Status:  [facility] - [facility]-pt  Chief Complaint: Stroke F/U  Subjective:  History of acute CVA s/p cerebral arteriogram with emergent stent assisted angioplasty of right ICA petrous stenosis achieving a TICI 3 revascularization via right femoral approach 11/13/2020 by Dr. Corliss Skains. Patient laying in bed resting comfortably. She responds to voice and follows simple commands. Can spontaneously move right side and LLE, minimal movements of LUE. Right femoral puncture site c/d/i.   Allergies: Sulfa antibiotics  Medications: Prior to Admission medications   Medication Sig Start Date End Date Taking? Authorizing Provider  ibuprofen (ADVIL) 200 MG tablet Take 400 mg by mouth every 6 (six) hours as needed for mild pain or headache.   Yes [provider]  Multiple Vitamins-Minerals (MULTIVITAMIN WITH MINERALS) tablet Take 1 tablet by mouth daily.   Yes [provider]     Vital Signs: BP 113/70 (BP Location: Right Arm)   Pulse 85   Temp 98.7 F (37.1 C) (Oral)   Resp 18   LMP 11/05/2020   SpO2 98%   Physical Exam Vitals and nursing note reviewed.  Constitutional:      General: She is not in acute distress. Pulmonary:     Effort: Pulmonary effort is normal. No respiratory distress.  Skin:    General: Skin is warm and dry.     Comments: Right femoral puncture site soft without active bleeding or hematoma.  Neurological:     Mental Status: She is alert.     Comments: Alert, awake, and oriented x3. Speech and comprehension intact. PERRL bilaterally. Can spontaneously move right side and LLE, LUE 2/5. Distal pulses (DPs) 2+ bilaterally.     Imaging: CT ANGIO HEAD W OR WO CONTRAST  Addendum Date: 11/13/2020   ADDENDUM REPORT: 11/13/2020 11:16 ADDENDUM: Transcription error in the impression. Should state occlusion or high-grade stenosis right  petrous ICA. Electronically Signed   By: Guadlupe Spanish M.D.   On: 11/13/2020 11:16   Result Date: 11/13/2020 CLINICAL DATA:  Code stroke follow-up, noncontrast CT positive for acute stroke EXAM: CT ANGIOGRAPHY HEAD AND NECK CT PERFUSION BRAIN TECHNIQUE: Multidetector CT imaging of the head and neck was performed using the standard protocol during bolus administration of intravenous contrast. Multiplanar CT image reconstructions and MIPs were obtained to evaluate the vascular anatomy. Carotid stenosis measurements (when applicable) are obtained utilizing NASCET criteria, using the distal internal carotid diameter as the denominator. Multiphase CT imaging of the brain was performed following IV bolus contrast injection. Subsequent parametric perfusion maps were calculated using RAPID software. CONTRAST:  OMNIPAQUE IOHEXOL 350 MG/ML SOLN COMPARISON:  None. FINDINGS: CTA NECK FINDINGS Aortic arch: Great vessel origins are patent. 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 Right carotid system: Patent. No measurable stenosis at the ICA origin. Left carotid system: Patent. No measurable stenosis at the ICA origin Vertebral arteries: Patent. Left vertebral artery is dominant. No measurable stenosis or evidence of dissection. Skeleton: No significant osseous abnormality Other neck: No mass or adenopathy. Upper chest: No apical lung mass. Review of the MIP images confirms the above findings CTA HEAD FINDINGS Anterior circulation: There is loss of right ICA enhancement within the carotid canal with reconstitution distally. Cavernous and supraclinoid portions are patent. Intracranial left ICA is patent. Bilateral anterior and middle cerebral arteries are patent. Posterior circulation: Intracranial vertebral arteries are patent and become diminutive after PICA origins. Basilar artery  is patent and small in caliber. This is due to bilateral fetal origin of posterior cerebral arteries.  Venous sinuses: As permitted by contrast timing, patent. 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 Remainder of proximal intracranial vessels are patent. Perfusion imaging demonstrates no evidence of core infarction despite findings on noncontrast head CT. Calculated territory at risk of 242 mL throughout the right MCA territory as well as MCA/ACA and MCA/PCA watershed territories. No occlusion or hemodynamically significant stenosis in the neck. Electronically Signed: By: [name] M.D. On: 11/13/2020 11:05   CT HEAD WO CONTRAST  Result Date: 11/13/2020 CLINICAL DATA:  Stroke follow-up. Status post emergent stent assisted angioplasty of right ICA petrous segment stenosis. EXAM: CT HEAD WITHOUT CONTRAST TECHNIQUE: Contiguous axial images were obtained from the base of the skull through the vertex without intravenous contrast. COMPARISON:  Head CT and CTA earlier today FINDINGS: Brain: Hypodensity is again seen in the right basal ganglia and adjacent deep white matter corresponding to the previously described acute infarct. No significant infarct extension, new infarct, intracranial hemorrhage, mass, midline shift, or extra-axial fluid collection is identified. The ventricles and sulci are normal. Vascular: Interval right petrous ICA stenting. Skull: No fracture or suspicious osseous lesion. Sinuses/Orbits: Mild right maxillary sinus mucosal thickening. Clear mastoid air cells. Unremarkable orbits. Other: None. IMPRESSION: 1. Unchanged acute right basal ganglia infarct. 2. No evidence of hemorrhage or other new intracranial abnormality. Electronically Signed   By: Logan Bores M.D.   On: 11/13/2020 17:27   CT ANGIO NECK W OR WO CONTRAST  Addendum Date:  11/13/2020   ADDENDUM REPORT: 11/13/2020 11:16 ADDENDUM: Transcription error in the impression. Should state occlusion or high-grade stenosis right petrous ICA. Electronically Signed   By: [name] M.D.   On: 11/13/2020 11:16   Result Date: 11/13/2020 CLINICAL DATA:  Code stroke follow-up, noncontrast CT positive for acute stroke EXAM: CT ANGIOGRAPHY HEAD AND NECK CT PERFUSION BRAIN TECHNIQUE: Multidetector CT imaging of the head and neck was performed using the standard protocol during bolus administration of intravenous contrast. Multiplanar CT image reconstructions and MIPs were obtained to evaluate the vascular anatomy. Carotid stenosis measurements (when applicable) are obtained utilizing NASCET criteria, using the distal internal carotid diameter as the denominator. Multiphase CT imaging of the brain was performed following IV bolus contrast injection. Subsequent parametric perfusion maps were calculated using RAPID software. CONTRAST:  168mL OMNIPAQUE IOHEXOL 350 MG/ML SOLN COMPARISON:  None. FINDINGS: CTA NECK FINDINGS Aortic arch: Great vessel origins are patent. 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 Right carotid system: Patent. No measurable stenosis at the ICA origin. Left carotid system: Patent. No measurable stenosis at the ICA origin Vertebral arteries: Patent. Left vertebral artery is dominant. No measurable stenosis or evidence of dissection. Skeleton: No significant osseous abnormality Other neck: No mass or adenopathy. Upper chest: No apical lung mass. Review of the MIP images confirms the above findings CTA HEAD FINDINGS Anterior circulation: There is loss of right ICA enhancement within the carotid canal with reconstitution distally. Cavernous and supraclinoid portions are patent. Intracranial left ICA is patent. Bilateral anterior and middle cerebral arteries are patent. Posterior circulation: Intracranial vertebral arteries are patent and become  diminutive after PICA origins. Basilar artery is patent and small in caliber.  This is due to bilateral fetal origin of posterior cerebral arteries. Venous sinuses: As permitted by contrast timing, patent. 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 Remainder of proximal intracranial vessels are patent. Perfusion imaging demonstrates no evidence of core infarction despite findings on noncontrast head CT. Calculated territory at risk of 242 mL throughout the right MCA territory as well as MCA/ACA and MCA/PCA watershed territories. No occlusion or hemodynamically significant stenosis in the neck. Electronically Signed: By: [name] M.D. On: 11/13/2020 11:05   MR ANGIO HEAD WO CONTRAST  Result Date: 11/14/2020 CLINICAL DATA:  Follow-up examination for acute stroke. History of emergent stent assisted angioplasty of right ICA petrous stenosis. EXAM: MRI HEAD WITHOUT CONTRAST MRA HEAD WITHOUT CONTRAST TECHNIQUE: Multiplanar, multiecho pulse sequences of the brain and surrounding structures were obtained without intravenous contrast. Angiographic images of the head were obtained using MRA technique without contrast. COMPARISON:  Prior CTs from 11/13/2020. FINDINGS: MRI HEAD FINDINGS Brain: Examination degraded by motion artifact. Cerebral volume within normal limits. No significant cerebral white matter disease. Patchy multifocal ischemic infarcts seen involving the subcortical and deep white matter of the right frontal and parietal lobes, largely watershed in distribution. Largest area of ischemia measures up to 3.1 cm at the right corona radiata just above the right basal ganglia. No associated hemorrhage or significant mass effect. No other evidence for acute  or subacute ischemia. Gray-white matter differentiation otherwise maintained. No encephalomalacia to suggest chronic cortical infarction elsewhere within the brain. No evidence for acute or chronic intracranial hemorrhage. No mass lesion, midline shift or mass effect. No hydrocephalus or extra-axial fluid collection. Pituitary gland suprasellar region normal. Midline structures intact. Vascular: Major intracranial vascular flow voids are maintained. Diminutive vertebrobasilar system noted. Skull and upper cervical spine: Craniocervical junction within normal limits. Upper cervical spine normal. Diffusely decreased T1 signal intensity seen within the visualized bone marrow, nonspecific, but most commonly related to anemia, smoking, or obesity. No focal marrow replacing lesion. No scalp soft tissue abnormality. Sinuses/Orbits: Globes and orbital soft tissues within normal limits. Mild scattered mucosal thickening noted within the paranasal sinuses. Right maxillary sinus retention cyst noted. Trace right mastoid effusion noted, of doubtful significance. Inner ear structures grossly normal. Other: None. MRA HEAD FINDINGS ANTERIOR CIRCULATION: Visualized distal cervical segments of the internal carotid arteries are patent with antegrade flow. Flow related signal loss seen within the petrous right ICA consistent with recent stent placement. Evaluation for implant stenosis limited due to susceptibility artifact. Widely plate and flow is seen distally within the cavernous and supraclinoid right ICA. The contralateral petrous, cavernous, and supraclinoid left ICAs widely patent. A1 segments widely patent. Normal anterior communicating artery complex. Anterior cerebral arteries patent to their distal aspects without stenosis. No M1 stenosis or occlusion. Negative MCA bifurcations. Distal MCA branches well perfused and symmetric. POSTERIOR CIRCULATION: Left vertebral artery dominant and widely patent to the vertebrobasilar  junction. Hypoplastic right vertebral artery largely terminates in PICA. Right PICA itself is patent. Left PICA origin patent and normal as well. Basilar diffusely diminutive but remains widely patent. Superior cerebellar arteries patent bilaterally. Predominant fetal type origin of both PCAs supplied via robust bilateral posterior communicating arteries. PCAs remain widely patent to their distal aspects. No intracranial aneurysm. IMPRESSION: MRI HEAD IMPRESSION: 1. Patchy multifocal acute ischemic right  MCA territory infarcts involving the subcortical and deep white matter of the right frontal and parietal lobes, largely watershed in distribution. No associated hemorrhage or significant mass effect. 2. Otherwise normal brain MRI for age. MRA HEAD IMPRESSION: 1. Sequelae of recent stent placement within the petrous right ICA. Evaluation for implant stenosis limited by susceptibility artifact, however, widely patent flow seen distally within the right carotid siphon. 2. No other hemodynamically significant stenosis or other vascular abnormality within the intracranial circulation. 3. Fetal type origin of both PCAs with overall diminutive vertebrobasilar system. Electronically Signed   By: Jeannine Boga M.D.   On: 11/14/2020 04:59   MR BRAIN WO CONTRAST  Result Date: 11/14/2020 CLINICAL DATA:  Follow-up examination for acute stroke. History of emergent stent assisted angioplasty of right ICA petrous stenosis. EXAM: MRI HEAD WITHOUT CONTRAST MRA HEAD WITHOUT CONTRAST TECHNIQUE: Multiplanar, multiecho pulse sequences of the brain and surrounding structures were obtained without intravenous contrast. Angiographic images of the head were obtained using MRA technique without contrast. COMPARISON:  Prior CTs from 11/13/2020. FINDINGS: MRI HEAD FINDINGS Brain: Examination degraded by motion artifact. Cerebral volume within normal limits. No significant cerebral white matter disease. Patchy multifocal ischemic  infarcts seen involving the subcortical and deep white matter of the right frontal and parietal lobes, largely watershed in distribution. Largest area of ischemia measures up to 3.1 cm at the right corona radiata just above the right basal ganglia. No associated hemorrhage or significant mass effect. No other evidence for acute or subacute ischemia. Gray-white matter differentiation otherwise maintained. No encephalomalacia to suggest chronic cortical infarction elsewhere within the brain. No evidence for acute or chronic intracranial hemorrhage. No mass lesion, midline shift or mass effect. No hydrocephalus or extra-axial fluid collection. Pituitary gland suprasellar region normal. Midline structures intact. Vascular: Major intracranial vascular flow voids are maintained. Diminutive vertebrobasilar system noted. Skull and upper cervical spine: Craniocervical junction within normal limits. Upper cervical spine normal. Diffusely decreased T1 signal intensity seen within the visualized bone marrow, nonspecific, but most commonly related to anemia, smoking, or obesity. No focal marrow replacing lesion. No scalp soft tissue abnormality. Sinuses/Orbits: Globes and orbital soft tissues within normal limits. Mild scattered mucosal thickening noted within the paranasal sinuses. Right maxillary sinus retention cyst noted. Trace right mastoid effusion noted, of doubtful significance. Inner ear structures grossly normal. Other: None. MRA HEAD FINDINGS ANTERIOR CIRCULATION: Visualized distal cervical segments of the internal carotid arteries are patent with antegrade flow. Flow related signal loss seen within the petrous right ICA consistent with recent stent placement. Evaluation for implant stenosis limited due to susceptibility artifact. Widely plate and flow is seen distally within the cavernous and supraclinoid right ICA. The contralateral petrous, cavernous, and supraclinoid left ICAs widely patent. A1 segments widely  patent. Normal anterior communicating artery complex. Anterior cerebral arteries patent to their distal aspects without stenosis. No M1 stenosis or occlusion. Negative MCA bifurcations. Distal MCA branches well perfused and symmetric. POSTERIOR CIRCULATION: Left vertebral artery dominant and widely patent to the vertebrobasilar junction. Hypoplastic right vertebral artery largely terminates in PICA. Right PICA itself is patent. Left PICA origin patent and normal as well. Basilar diffusely diminutive but remains widely patent. Superior cerebellar arteries patent bilaterally. Predominant fetal type origin of both PCAs supplied via robust bilateral posterior communicating arteries. PCAs remain widely patent to their distal aspects. No intracranial aneurysm. IMPRESSION: MRI HEAD IMPRESSION: 1. Patchy multifocal acute ischemic right MCA territory infarcts involving the subcortical and deep white matter of the right frontal and  parietal lobes, largely watershed in distribution. No associated hemorrhage or significant mass effect. 2. Otherwise normal brain MRI for age. MRA HEAD IMPRESSION: 1. Sequelae of recent stent placement within the petrous right ICA. Evaluation for implant stenosis limited by susceptibility artifact, however, widely patent flow seen distally within the right carotid siphon. 2. No other hemodynamically significant stenosis or other vascular abnormality within the intracranial circulation. 3. Fetal type origin of both PCAs with overall diminutive vertebrobasilar system. Electronically Signed   By: Jeannine Boga M.D.   On: 11/14/2020 04:59   CT CEREBRAL PERFUSION W CONTRAST  Addendum Date: 11/13/2020   ADDENDUM REPORT: 11/13/2020 11:16 ADDENDUM: Transcription error in the impression. Should state occlusion or high-grade stenosis right petrous ICA. Electronically Signed   By: [name] M.D.   On: 11/13/2020 11:16   Result Date: 11/13/2020 CLINICAL DATA:  Code stroke follow-up,  noncontrast CT positive for acute stroke EXAM: CT ANGIOGRAPHY HEAD AND NECK CT PERFUSION BRAIN TECHNIQUE: Multidetector CT imaging of the head and neck was performed using the standard protocol during bolus administration of intravenous contrast. Multiplanar CT image reconstructions and MIPs were obtained to evaluate the vascular anatomy. Carotid stenosis measurements (when applicable) are obtained utilizing NASCET criteria, using the distal internal carotid diameter as the denominator. Multiphase CT imaging of the brain was performed following IV bolus contrast injection. Subsequent parametric perfusion maps were calculated using RAPID software. CONTRAST:  152mL OMNIPAQUE IOHEXOL 350 MG/ML SOLN COMPARISON:  None. FINDINGS: CTA NECK FINDINGS Aortic arch: Great vessel origins are patent. 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 Right carotid system: Patent. No measurable stenosis at the ICA origin. Left carotid system: Patent. No measurable stenosis at the ICA origin Vertebral arteries: Patent. Left vertebral artery is dominant. No measurable stenosis or evidence of dissection. Skeleton: No significant osseous abnormality Other neck: No mass or adenopathy. Upper chest: No apical lung mass. Review of the MIP images confirms the above findings CTA HEAD FINDINGS Anterior circulation: There is loss of right ICA enhancement within the carotid canal with reconstitution distally. Cavernous and supraclinoid portions are patent. Intracranial left ICA is patent. Bilateral anterior and middle cerebral arteries are patent. Posterior circulation: Intracranial vertebral arteries are patent and become diminutive after PICA origins. Basilar artery is patent and small in caliber. This is due to bilateral fetal origin of posterior cerebral arteries. Venous sinuses: As permitted by contrast timing, patent. 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 Remainder of proximal intracranial vessels are patent. Perfusion imaging demonstrates no evidence of core infarction despite findings on noncontrast head CT. Calculated territory at risk of 242 mL throughout the right MCA territory as well as MCA/ACA and MCA/PCA watershed territories. No occlusion or hemodynamically significant stenosis in the neck. Electronically Signed: By: [name] M.D. On: 11/13/2020 11:05   DG Chest Port 1 View  Result Date: 11/13/2020 CLINICAL DATA:  40 year old female with cough. EXAM: PORTABLE CHEST 1 VIEW COMPARISON:  None. FINDINGS: No focal consolidation, pleural effusion or pneumothorax. The cardiac silhouette is within limits. No acute osseous pathology. IMPRESSION: No active disease. Electronically Signed   By: Anner Crete M.D.   On: 11/13/2020 18:47   ECHOCARDIOGRAM COMPLETE  Result Date: 11/14/2020    ECHOCARDIOGRAM REPORT   Patient Name:  [facility] Date of Exam: 11/14/2020 Medical Rec #:  [id number]      Height:       64.0 in Accession #:    [id number]     Weight:       180.0 lb Date of Birth:  [birth year]     BSA:          1.871 m Patient Age:    40 years       BP:           126/70 mmHg Patient Gender: F              HR:           78 bpm. Exam Location:  Inpatient Procedure: 2D Echo, 3D Echo, Cardiac Doppler and Color Doppler Indications:    Stroke  History:        Patient has no prior history of Echocardiogram examinations.                 Stroke. Covid infection 12/21.  Sonographer:    Roseanna Rainbow RDCS Referring Phys: [id number] [facility]  Sonographer Comments: Technically difficult study due to poor echo windows. IMPRESSIONS  1. Left ventricular ejection fraction, by estimation, is 60 to 65%. The left ventricle has normal function. The left ventricle has no  regional wall motion abnormalities. Left ventricular diastolic parameters were normal.  2. Right ventricular systolic function is normal. The right ventricular size is normal. There is normal pulmonary artery systolic pressure. The estimated right ventricular systolic pressure is 0000000 mmHg.  3. The mitral valve is normal in structure. Trivial mitral valve regurgitation. No evidence of mitral stenosis.  4. The aortic valve is tricuspid. Aortic valve regurgitation is not visualized. No aortic stenosis is present.  5. The inferior vena cava is normal in size with greater than 50% respiratory variability, suggesting right atrial pressure of 3 mmHg. FINDINGS  Left Ventricle: Left ventricular ejection fraction, by estimation, is 60 to 65%. The left ventricle has normal function. The left ventricle has no regional wall motion abnormalities. The left ventricular internal cavity size was normal in size. There is  no left ventricular hypertrophy. Left ventricular diastolic parameters were normal. Right Ventricle: The right ventricular size is normal. No increase in right ventricular wall thickness. Right ventricular systolic function is normal. There is normal pulmonary artery systolic pressure. The tricuspid regurgitant velocity is 1.84 m/s, and  with an assumed right atrial pressure of 3 mmHg, the estimated right ventricular systolic pressure is 0000000 mmHg. Left Atrium: Left atrial size was normal in size. Right Atrium: Right atrial size was normal in size. Pericardium: There is no evidence of pericardial effusion. Mitral Valve: The mitral valve is normal in structure. Trivial mitral valve regurgitation. No evidence of mitral valve stenosis. Tricuspid Valve: The tricuspid valve is normal in structure. Tricuspid valve regurgitation is trivial. Aortic Valve: The aortic valve is tricuspid. Aortic valve regurgitation is not visualized. No aortic stenosis is present. Pulmonic Valve: The pulmonic valve was normal in structure.  Pulmonic valve regurgitation is trivial. Aorta: The aortic root is normal in size and structure. Venous: The inferior vena cava is normal in size with greater than 50% respiratory variability, suggesting right atrial pressure of 3 mmHg. IAS/Shunts: No atrial level shunt detected by color flow Doppler.  LEFT VENTRICLE PLAX 2D LVIDd:         4.10 cm     Diastology LVIDs:         2.30 cm  LV e' medial:    9.25 cm/s LV PW:         1.20 cm     LV E/e' medial:  9.6 LV IVS:        1.10 cm     LV e' lateral:   10.70 cm/s LVOT diam:     1.50 cm     LV E/e' lateral: 8.3 LV SV:         34 LV SV Index:   18 LVOT Area:     1.77 cm  LV Volumes (MOD) LV vol d, MOD A2C: 38.0 ml LV vol d, MOD A4C: 61.9 ml LV vol s, MOD A2C: 16.8 ml LV vol s, MOD A4C: 21.5 ml LV SV MOD A2C:     21.2 ml LV SV MOD A4C:     61.9 ml LV SV MOD BP:      28.4 ml RIGHT VENTRICLE             IVC RV S prime:     12.20 cm/s  IVC diam: 2.10 cm TAPSE (M-mode): 2.0 cm LEFT ATRIUM             Index       RIGHT ATRIUM           Index LA diam:        3.40 cm 1.82 cm/m  RA Area:     10.70 cm LA Vol (A2C):   24.2 ml 12.94 ml/m RA Volume:   19.30 ml  10.32 ml/m LA Vol (A4C):   23.6 ml 12.62 ml/m LA Biplane Vol: 24.1 ml 12.88 ml/m  AORTIC VALVE LVOT Vmax:   105.00 cm/s LVOT Vmean:  65.700 cm/s LVOT VTI:    0.191 m  AORTA Ao Root diam: 3.10 cm Ao Asc diam:  2.80 cm MITRAL VALVE               TRICUSPID VALVE MV Area (PHT): 5.20 cm    TR Peak grad:   13.5 mmHg MV Decel Time: 146 msec    TR Vmax:        184.00 cm/s MV E velocity: 89.20 cm/s MV A velocity: 72.70 cm/s  SHUNTS MV E/A ratio:  1.23        Systemic VTI:  0.19 m                            Systemic Diam: 1.50 cm Loralie Champagne MD Electronically signed by Loralie Champagne MD Signature Date/Time: 11/14/2020/6:43:52 PM    Final    CT HEAD CODE STROKE WO CONTRAST  Result Date: 11/13/2020 CLINICAL DATA:  Code stroke.  Right-sided gaze, facial droop EXAM: CT HEAD WITHOUT CONTRAST TECHNIQUE: Contiguous axial  images were obtained from the base of the skull through the vertex without intravenous contrast. COMPARISON:  None. FINDINGS: Brain: There is no acute intracranial hemorrhage. Hypoattenuation is present involving the right caudate, adjacent white matter, and superior lentiform nucleus. No significant mass effect. No hydrocephalus. No extra-axial collection. Vascular: No hyperdense vessel. Skull: Unremarkable. Sinuses/Orbits: No acute abnormality Other: Mastoid air cells are clear. ASPECTS [facility] Stroke Program Early CT Score) - Ganglionic level infarction (caudate, lentiform nuclei, internal capsule, insula, M1-M3 cortex): 4 - Supraganglionic infarction (M4-M6 cortex): 3 Total score (0-10 with 10 being normal): 7 IMPRESSION: No acute intracranial hemorrhage. Acute infarction involving the right caudate, adjacent white matter, and superior lentiform nucleus. ASPECTS is 7. These results were called by telephone at  the time of interpretation on 11/13/2020 at 10:34 am to provider Dr. Erlinda Hong, who verbally acknowledged these results. Electronically Signed   By: [name] M.D.   On: 11/13/2020 10:38   VAS [place] LOWER EXTREMITY VENOUS (DVT)  Result Date: 11/14/2020  Lower Venous DVT Study Indications: Stroke.  Risk Factors: None identified. Anticoagulation: Lovenox. Comparison Study: No previous exam Performing Technologist: Vonzell Schlatter RVT  Examination Guidelines: A complete evaluation includes B-mode imaging, spectral Doppler, color Doppler, and power Doppler as needed of all accessible portions of each vessel. Bilateral testing is considered an integral part of a complete examination. Limited examinations for reoccurring indications may be performed as noted. The reflux portion of the exam is performed with the patient in reverse Trendelenburg.  +---------+---------------+---------+-----------+----------+--------------+ RIGHT    CompressibilityPhasicitySpontaneityPropertiesThrombus Aging  +---------+---------------+---------+-----------+----------+--------------+ CFV      Full           Yes      Yes                                 +---------+---------------+---------+-----------+----------+--------------+ SFJ      Full                                                        +---------+---------------+---------+-----------+----------+--------------+ FV Prox  Full                                                        +---------+---------------+---------+-----------+----------+--------------+ FV Mid   Full                                                        +---------+---------------+---------+-----------+----------+--------------+ FV DistalFull                                                        +---------+---------------+---------+-----------+----------+--------------+ PFV      Full                                                        +---------+---------------+---------+-----------+----------+--------------+ POP      Full           Yes      Yes                                 +---------+---------------+---------+-----------+----------+--------------+ PTV      Full                                                        +---------+---------------+---------+-----------+----------+--------------+  PERO     Full                                                        +---------+---------------+---------+-----------+----------+--------------+   +---------+---------------+---------+-----------+----------+--------------+ LEFT     CompressibilityPhasicitySpontaneityPropertiesThrombus Aging +---------+---------------+---------+-----------+----------+--------------+ CFV      Full           Yes      Yes                                 +---------+---------------+---------+-----------+----------+--------------+ SFJ      Full                                                         +---------+---------------+---------+-----------+----------+--------------+ FV Prox  Full                                                        +---------+---------------+---------+-----------+----------+--------------+ FV Mid   Full                                                        +---------+---------------+---------+-----------+----------+--------------+ FV DistalFull                                                        +---------+---------------+---------+-----------+----------+--------------+ PFV      Full                                                        +---------+---------------+---------+-----------+----------+--------------+ POP      Full           Yes      Yes                                 +---------+---------------+---------+-----------+----------+--------------+ PTV      Full                                                        +---------+---------------+---------+-----------+----------+--------------+ PERO     Full                                                        +---------+---------------+---------+-----------+----------+--------------+  Summary: BILATERAL: - No evidence of deep vein thrombosis seen in the lower extremities, bilaterally. -No evidence of popliteal cyst, bilaterally.   *See table(s) above for measurements and observations. Electronically signed by Servando Snare MD on 11/14/2020 at 1:58:02 PM.    Final     Labs:  CBC: Recent Labs    03/21/20 1050 11/13/20 1023 11/13/20 1027 11/15/20 0613  WBC 4.4 8.0  --  6.2  HGB 12.8 14.0 14.3 10.5*  HCT 37.5 41.9 42.0 31.6*  PLT 220.0 228  --  207    COAGS: Recent Labs    11/13/20 1023  INR 1.1  APTT 28    BMP: Recent Labs    03/21/20 1050 11/13/20 1023 11/13/20 1027 11/14/20 0549 11/15/20 0613  NA 138 137 139 138 140  K 4.3 3.7 3.6 3.5 3.5  CL 107 104 106 108 109  CO2 27 23  --  18* 20*  GLUCOSE 83 94 90 88 85  BUN 14 11 13 9 10   CALCIUM 9.1  9.3  --  8.4* 8.4*  CREATININE 0.84 1.06* 0.90 0.79 0.80  GFRNONAA  --  >60  --  >60 >60    LIVER FUNCTION TESTS: Recent Labs    03/21/20 1050 11/13/20 1023  BILITOT 0.5 1.3*  AST 17 27  ALT 18 37  ALKPHOS 32* 33*  PROT 6.7 7.4  ALBUMIN 4.5 4.3    Assessment and Plan:  History of acute CVA s/p cerebral arteriogram with emergent stent assisted angioplasty of right ICA petrous stenosis achieving a TICI 3 revascularization via right femoral approach 11/13/2020 by Dr. Estanislado Pandy. Patient's condition stable- awake and alert and following commands, can spontaneously move right side and LLE, LUE 2/5. Right femoral puncture site stable, distal pulses (DPs) 2+ bilaterally. Continue taking Brilinta 90 mg twice daily and Aspirin 81 mg once daily. For possible transfer to CIR. Plan to follow-up with Dr. Estanislado Pandy with CTA head/neck (with contrast) 3 months after discharge ([facility] schedulers to call patient to set up this appointment). Further plans per neurology- appreciate and agree with management. Please call [facility] with questions/concerns.   Electronically Signed: Earley Abide, PA-C 11/15/2020, 10:07 AM   I spent a total of 25 Minutes at the the patient's bedside AND on the patient's hospital floor or unit, greater than 50% of which was counseling/coordinating care for CVA s/p revascularization.

## 2020-11-15 NOTE — Progress Notes (Signed)
Inpatient Rehabilitation Admissions Coordinator  I met with patient and her Mom at bedside. We discussed goals and expectations of  a possible Cir admit. They prefer Cir at this time. I will begin insurance approval with [employer] and follow up on Monday. Admit pending approval from [place].  Danne Baxter, RN, MSN Rehab Admissions Coordinator [phone] 11/15/2020 3:12 PM

## 2020-11-15 NOTE — Progress Notes (Signed)
Transcranial doppler w/ bubble study completed.   Please see CV Proc for preliminary results.   Darlin Coco, RDMS

## 2020-11-15 NOTE — Progress Notes (Signed)
Physical Therapy Treatment Patient Details Name: Dana Henry MRN: [id number] DOB: October 27, 1980 Today's Date: 11/15/2020    History of Present Illness Dana Henry is a 40 y.o. Caucasian female with PMH of migraine, COVID infection 09/2020 presented to ED for code stroke (neglect, L sided weakness and decreased sensation, visual deficits on L). 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  Thrombectomy 1/26.    PT Comments    Pt sleeping upon arrival and reported being "groggy" this morning. Pt very motivated to participate and improve. Progressed mobility distance, allowing her to be able to walk short household distances with minA and use of a RW for support. However, her L arm weakness and neglect impacts her safety with mobility as she needs intermittent assistance to keep L hand in contact with RW and to safely manage the RW during turns. Educated pt on performing simultaneous movements with bil limbs to facilitate neural crossover and gentle pressure through the L arm to encourage muscle activation. Performed lower extremity exercises at end of session, with her R leg compensating for her L leg weakness. Will continue to follow acutely. Pt would greatly benefit from intensive therapies in the CIR setting as she is highly motivated, decreased function compared to PLOF, and young in age.   Follow Up Recommendations  [facility]     Equipment Recommendations  Rolling walker with 5" wheels;Other (comment) (Left platform vs hand/walker splint)    Recommendations for Other Services Rehab consult     Precautions / Restrictions Precautions Precautions: Fall Precaution Comments: L inattention Restrictions Weight Bearing Restrictions: No    Mobility  Bed Mobility Overal bed mobility: Needs Assistance Bed Mobility: Supine to Sit;Sit to Supine     Supine to sit: Min assist;HOB elevated Sit to supine: Min assist   General bed mobility comments: Cued pt to "punch" L UE across body  with AAROM provided. Cued pt to sldie legs off R EOB and grab R rail with R hand to transition onto R elbow > hand to ascend trunk, needing minA to initiate but once elbow was under her torso she completed transition to sit. MinA to manage L leg and arm back into bed safely.  Transfers Overall transfer level: Needs assistance Equipment used: Rolling walker (2 wheeled) Transfers: Sit to/from Stand Sit to Stand: Min assist;From elevated surface         General transfer comment: L UE on RW to start with R hand on bed to push up, minA for steadying and maintaining L hand contact with RW.  Ambulation/Gait Ambulation/Gait assistance: Min assist Gait Distance (Feet): 60 Feet Assistive device: Rolling walker (2 wheeled) Gait Pattern/deviations: Step-through pattern;Decreased stride length;Decreased step length - left;Decreased dorsiflexion - left Gait velocity: decr Gait velocity interpretation: <1.31 ft/sec, indicative of household ambulator General Gait Details: Pt tends to look to R. Needed cues to turn either direction to safely manage lines. Hand-over-hand intermittently and repositioning of L hand on RW, poor awareness when it would slip out of position. Decreased L step length needing tactile and verbal cues to minimally correct. Repeated cues for sequencing RW and feet movement with turning and to remain within RW. 1 LOB posteriorly, needing minA to recover.   Stairs             Wheelchair Mobility    Modified Rankin (Stroke Patients Only) Modified Rankin (Stroke Patients Only) Pre-Morbid Rankin Score: No symptoms Modified Rankin: Moderately severe disability     Balance Overall balance assessment: Needs assistance Sitting-balance support: Single  extremity supported;Feet supported Sitting balance-Leahy Scale: Fair     Standing balance support: Bilateral upper extremity supported Standing balance-Leahy Scale: Poor Standing balance comment: dependent on at least one UE  support for balance                            Cognition Arousal/Alertness: Awake/alert Behavior During Therapy: WFL for tasks assessed/performed Overall Cognitive Status: Impaired/Different from baseline Area of Impairment: Problem solving                             Problem Solving: Slow processing;Difficulty sequencing;Requires verbal cues General Comments: Slow processing with cues, needing VCs to sequence RW and feet with turns.      Exercises General Exercises - Lower Extremity Hip ABduction/ADduction: Strengthening;Both;10 reps;Supine (pillow squeezes and manual resistance with hip abduction) Straight Leg Raises: Strengthening;Both;10 reps Mini-Sqauts: Strengthening;10 reps (sit to stand from EOB using R UE with R foot anterior to L to encourage L leg usage, pt tends to move R leg though to compensate)    General Comments General comments (skin integrity, edema, etc.): Educated pt on neural crossover and to perform bil limb exercises simultaneously and to try gently weight bearing on L arm to encourage muscle activation      Pertinent Vitals/Pain Pain Assessment: No/denies pain    Home Living                      Prior Function            PT Goals (current goals can now be found in the care plan section) Acute Rehab PT Goals Patient Stated Goal: to be independent again; to move into new home PT Goal Formulation: With patient Time For Goal Achievement: 11/28/20 Potential to Achieve Goals: Good Progress towards PT goals: Progressing toward goals    Frequency    Min 4X/week      PT Plan Current plan remains appropriate    Co-evaluation              AM-PAC PT "6 Clicks" Mobility   Outcome Measure  Help needed turning from your back to your side while in a flat bed without using bedrails?: A Little Help needed moving from lying on your back to sitting on the side of a flat bed without using bedrails?: A Little Help  needed moving to and from a bed to a chair (including a wheelchair)?: A Little Help needed standing up from a chair using your arms (e.g., wheelchair or bedside chair)?: A Little Help needed to walk in hospital room?: A Little Help needed climbing 3-5 steps with a railing? : Total 6 Click Score: 16    End of Session Equipment Utilized During Treatment: Gait belt Activity Tolerance: Patient tolerated treatment well Patient left: in bed;with call bell/phone within reach;with bed alarm set;with family/visitor present   PT Visit Diagnosis: Hemiplegia and hemiparesis;Unsteadiness on feet (R26.81);Other abnormalities of gait and mobility (R26.89);Muscle weakness (generalized) (M62.81);Difficulty in walking, not elsewhere classified (R26.2);Other symptoms and signs involving the nervous system (R29.898) Hemiplegia - Right/Left: Left Hemiplegia - dominant/non-dominant: Non-dominant Hemiplegia - caused by: Cerebral infarction     Time: 1251-1318 PT Time Calculation (min) (ACUTE ONLY): 27 min  Charges:  $Gait Training: 8-22 mins $Therapeutic Exercise: 8-22 mins                     Radonna Bracher [facility],  PT, DPT Acute Rehabilitation Services  Pager: [phone] Office: [facility] 11/15/2020, 1:31 PM

## 2020-11-15 NOTE — Progress Notes (Addendum)
STROKE TEAM PROGRESS NOTE   INTERVAL HISTORY No acute overnight events. Patient evaluated at bedside this morning, no family present in the room.  Patient states she feels sleepy today, although feels better.  Patient is encouraged to eat and drink well so that we can discontinue IV fluids.  Patient complains of mild headache but no other new complaints./OT suggested CIR.  Blood pressure well controlled.  Neurologically patient is improving.  TCD bubble study scheduled for this afternoon.  Hypercoagulable panel pending and TEE scheduled for 11/18/2020.  Vitals:   11/15/20 0020 11/15/20 0353 11/15/20 0749 11/15/20 1132  BP: (!) 120/59 109/66 113/70 112/61  Pulse: 82 79 85 82  Resp: 18 18 18 18   Temp: 98.2 F (36.8 C) 98.6 F (37 C) 98.7 F (37.1 C) 98.7 F (37.1 C)  TempSrc: Oral Oral Oral Oral  SpO2: 98% 98% 98% 98%   CBC:  Recent Labs  Lab 11/13/20 1023 11/13/20 1027 11/15/20 0613  WBC 8.0  --  6.2  NEUTROABS 5.9  --   --   HGB 14.0 14.3 10.5*  HCT 41.9 42.0 31.6*  MCV 90.7  --  90.0  PLT 228  --  [id number]   Basic Metabolic Panel:  Recent Labs  Lab 11/14/20 0549 11/15/20 0613  NA 138 140  K 3.5 3.5  CL 108 109  CO2 18* 20*  GLUCOSE 88 85  BUN 9 10  CREATININE 0.79 0.80  CALCIUM 8.4* 8.4*   Lipid Panel:  Recent Labs  Lab 11/14/20 0549  CHOL 156  TRIG 111  HDL 38*  CHOLHDL 4.1  VLDL 22  LDLCALC 96   HgbA1c:  Recent Labs  Lab 11/14/20 0549  HGBA1C 5.1   Urine Drug Screen: Pending  Hypercoagulable panel: Pending  IMAGING past 24 hours  CT Head code stroke wo contrast 11/13/2020  IMPRESSION: No acute intracranial hemorrhage. Acute infarction involving the right caudate, adjacent white matter, and superior lentiform nucleus. ASPECTS is 7.  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 Remainder of proximal intracranial vessels are patent.  Perfusion  imaging demonstrates no evidence of core infarction despite findings on noncontrast head CT. Calculated territory at risk of 242 mL throughout the right MCA territory as well as MCA/ACA and MCA/PCA watershed territories.  No occlusion or hemodynamically significant stenosis in the neck.  CT head wo contrast 11/13/2020  IMPRESSION: 1. Unchanged acute right basal ganglia infarct. 2. No evidence of hemorrhage or other new intracranial abnormality.  Chest x-ray 11/13/2020  IMPRESSION: No active disease.  MR Brain  11/14/2020  1. 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 No associated hemorrhage or significant mass effect. 2. Otherwise normal brain MRI for age.  MRA Head  11/14/2020  MRA HEAD IMPRESSION:  1. Sequelae of recent stent placement within the petrous right ICA. Evaluation for implant stenosis limited by susceptibility artifact, however, widely patent flow seen distally within the right carotid siphon. 2. No other hemodynamically significant stenosis or other vascular abnormality within the intracranial circulation. 3. Fetal type origin of both PCAs with overall diminutive vertebrobasilar system.  VAS [place] lower Extremity 11/14/2020  Summary:  BILATERAL:  - No evidence of deep vein thrombosis seen in the lower extremities,  bilaterally.  -No evidence of popliteal cyst, bilaterally.   Echocardiogram 11/14/2020  1. Left  ventricular ejection fraction, by estimation, is 60 to 65%. The  left ventricle has normal function. The left ventricle has no regional  wall motion abnormalities. Left ventricular diastolic parameters were  normal.  2. Right ventricular systolic function is normal. The right ventricular  size is normal. There is normal pulmonary artery systolic pressure. The  estimated right ventricular systolic  pressure is 16.1 mmHg.  3. The mitral valve is normal in structure. Trivial mitral valve  regurgitation. No evidence of mitral stenosis.  4. The aortic valve is tricuspid. Aortic valve regurgitation is not  visualized. No aortic stenosis is present.  5. The inferior vena cava is normal in size with greater than 50%  respiratory variability, suggesting right atrial pressure of 3 mmHg.    PHYSICAL EXAM  General: Well developed, obese young female lying comfortably in bed, NAD HEENT: [place]/AT, MMM, PERRLA Cardiovascular: Regular rate and rhythm  Respiratory: No respiratory distress Abdominal: Soft, nontender, no rigidity Psych: Normal mood and affect Neurological:  General: NAD Mental Status: Alert, oriented, thought content appropriate.  Speech fluent without evidence of aphasia.  Able to follow pull commands without difficulty. Cranial Nerves: II:  Visual fields grossly normal, pupils equal, round, reactive to light and accommodation III,IV, VI: ptosis not present, extra-ocular motions intact bilaterally, right gaze preference resolved V,VII: smile asymmetric, left facial droop,facial light touch sensation decreased on left around 70% as compared to 100% on right VIII: hearing normal bilaterally IX,X: uvula rises symmetrically XI: bilateral shoulder shrug XII: midline tongue extension without atrophy or fasciculations  Motor: Right : Upper extremity   5/5    Left:     Upper extremity   proximal 2/5, bicep 3 / 5, tricep 2/5,                                                                                                  finger grip 2/5  Lower extremity   5/5     Lower extremity   5/5 Tone and bulk:normal tone throughout; no atrophy noted Sensory: Pinprick and light touch decreased by 30% on left side. Deep Tendon Reflexes:  Right: Upper Extremity   Left: Upper extremity   biceps (C-5 to C-6) 2/4   biceps (C-5 to C-6) 2/4 tricep (C7) 2/4    triceps (C7) 2/4 Brachioradialis (C6)  2/4  Brachioradialis (C6) 2/4  Lower Extremity Lower Extremity  quadriceps (L-2 to L-4) 2/4   quadriceps (L-2 to L-4) 2/4 Achilles (S1) 2/4   Achilles (S1) 2/4  Plantars: Right: downgoing   Left: downgoing Cerebellar: normal finger-to-nose  Gait: Deferred    ASSESSMENT/PLAN Dana Henry is a 40 y.o. female with PMH X of migraine, COVID-19 infection in 09/2020 presented to ED for left arm and left leg numbness.  Left arm> weaker than left leg since Sunday and worsening left facial droop and right gaze preference.  CT head showed right caudate and basal ganglia subacute infarct no tPA administered, due to outside of window.  CT head and neck shows right ICA petrous segment occlusion versus high-grade stenosis.  CTP showed large penumbra on right and  patient underwent successful thrombectomy.  MRI this morning shows patchy multifocal acute ischemic right MCA territory infarct involving the subcortical and deep white matter of right frontal and parietal lobes, largely watershed in distribution.  Echocardiogram pending today, planning to follow-up with TEE and TCD bubble study.  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  Code Stroke CT head : No acute intracranial hemorrhage. Acute infarction involving the right caudate, adjacent white matter, and superior lentiform nucleus. 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  CT perfusion: no core, large right penumbra  MRI : Patchy multifocal acute ischemic right MCA territory infarcts involving the subcortical and deep white matter of the right frontal and parietal lobes, largely watershed in distribution.  MRA : Sequelae of recent stent placement within the petrous right ICA. widely patent flow seen distally within the right carotid siphon.  2D Echo: EF 60 to 65%.  Doppler Lower extremities [place]: No  evidence of deep vein thrombosis   TCD bubble study: Positive PFO with spencer degree 2 on both rest and valsalva  TEE: Pending on 11/18/2020  LDL 96  HgbA1c 5.1  UDS + THC  Hypercoagulable and autoimmune labs pending  VTE prophylaxis - Lovenox 40 mg  No antithrombotic prior to admission, now on aspirin 81 mg daily and Brilinta (ticagrelor) 90 mg bid.   Therapy recommendations: CIR  Disposition: Pending  PFO  Positive TCD bubble study with spencer degree 2 at rest and with valsalva  TEE on 11/18/20 to further evaluation  May consider PFO closure if no other source of stroke found  Hypertension  Home meds: None  BP fluctuate . Long-term BP goal normotensive  Hyperlipidemia  Home meds: None  LDL 96, goal < 70  on lipitor 40  Continue statin at discharge  Anemia   Hb 14.0->10.5  Could be due to acute blood loss with procedure  CBC monitoring  Other Stroke Risk Factors  Obesity: Patient will be advised to exercise and lose weight.  Past COVID-19 infection on 09/2020  Migraine   Hospital day # 2  ATTENDING NOTE: I reviewed above note and agree with the assessment and plan. Pt was seen and examined.   40 year old female with history of migraine and Covid infection in 09/2020 admitted for left-sided weakness, right gaze, left facial droop. CT found subacute right caudate and CR/BG infarcts. CT head and neck showed right petrous ICA occlusion with distal reconstitution. CTP large penumbra. Stat post EVD with right ICA petrous segment stenting. Currently on aspirin and Brilinta. 2D echo EF 60-65%, no DVT, LDL 96, A1c 5.1. UDS + THC.  Awake alert, orientated x 3. No aphasia or dysarthria, following all simple commands. No gaze palsy, right gaze preference resolved. Visual field full. PERRL. Left facial droop. Tongue midline. Left UE proximal 2/5, bicep 3-/5, tricep 2/5, finger grip 2/5. Left LE 4+/5. Right UE and LE 5/5. Sensation symmetrical now and no  sensory neglect. Right FTN intact. Gait not tested.   Further stroke work up showed TCD bubble study spencer degree II with rest and valsalva. Will have TEE on 11/18/20 for further evaluation, may consider PFO closure if no other source of stroke found. Hb down from 14 to 10.5, will need close monitoring. Continue ASA and brinlinta and statin. PT/OT recommend CIR.  [name], MD PhD Stroke Neurology 11/15/2020 3:21 PM  To contact  Stroke Continuity provider, please refer to [email or web address] After hours, contact General Neurology

## 2020-11-15 NOTE — Progress Notes (Signed)
    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  After careful review of history and examination, the risks and benefits of transesophageal echocardiogram have been explained to the patient and mother at bedside including risks of esophageal damage, perforation (1:10,000 risk), bleeding, pharyngeal hematoma as well as other potential complications associated with conscious sedation including aspiration, arrhythmia, respiratory failure and death. Alternatives to treatment were discussed, questions were answered. Patient is willing to proceed.  TEE - Dr. Margaretann Loveless 11/18/20 @ 9am. NPO after midnight. Meds with sips.   Leanor Kail, PA-C 11/15/2020 5:42 PM

## 2020-11-15 NOTE — Discharge Instructions (Signed)
Femoral Site Care This sheet gives you information about how to care for yourself after your procedure. Your health care provider may also give you more specific instructions. If you have problems or questions, contact your health care provider. What can I expect after the procedure? After the procedure, it is common to have:  Bruising that usually fades within 1-2 weeks.  Tenderness at the site. Follow these instructions at home: Wound care 1. Follow instructions from your health care provider about how to take care of your insertion site. Make sure you: ? Wash your hands with soap and water before you change your bandage (dressing). If soap and water are not available, use hand sanitizer. ? Change your dressing as directed- pressure dressing removed 24 hours post-procedure (and switch for bandaid), bandaid removed 72 hours post-procedure 2. Do not take baths, swim, or use a hot tub for 7 days post-procedure. 3. You may shower 48 hours after the procedure or as told by your health care provider. ? Gently wash the site with plain soap and water. ? Pat the area dry with a clean towel. ? Do not rub the site. This may cause bleeding. 4. Check your site every day for signs of infection. Check for: ? Redness, swelling, or pain. ? Fluid or blood. ? Warmth. ? Pus or a bad smell. Activity  Do not stoop, bend, or lift anything that is heavier than 10 lb (4.5 kg) for 2 weeks post-procedure.  Do not drive self for 2 weeks post-procedure. Contact a health care provider if you have:  A fever or chills.  You have redness, swelling, or pain around your insertion site. Get help right away if:  The catheter insertion area swells very fast.  You pass out.  You suddenly start to sweat or your skin gets clammy.  The catheter insertion area is bleeding, and the bleeding does not stop when you hold steady pressure on the area.  The area near or just beyond the catheter insertion site becomes  pale, cool, tingly, or numb. These symptoms may represent a serious problem that is an emergency. Do not wait to see if the symptoms will go away. Get medical help right away. Call your local emergency services (911 in the [place] Do not drive yourself to the hospital.  This information is not intended to replace advice given to you by your health care provider. Make sure you discuss any questions you have with your health care provider. Document Revised: 10/18/2017 Document Reviewed: 10/18/2017 Elsevier Patient Education  2020 [employer] 

## 2020-11-16 LAB — CBC
HCT: 30 % — ABNORMAL LOW (ref 36.0–46.0)
Hemoglobin: 10.5 g/dL — ABNORMAL LOW (ref 12.0–15.0)
MCH: 31 pg (ref 26.0–34.0)
MCHC: 35 g/dL (ref 30.0–36.0)
MCV: 88.5 fL (ref 80.0–100.0)
Platelets: 193 10*3/uL (ref 150–400)
RBC: 3.39 MIL/uL — ABNORMAL LOW (ref 3.87–5.11)
RDW: 12.6 % (ref 11.5–15.5)
WBC: 6.2 10*3/uL (ref 4.0–10.5)
nRBC: 0 % (ref 0.0–0.2)

## 2020-11-16 LAB — LUPUS ANTICOAGULANT PANEL
DRVVT: 36 s (ref 0.0–47.0)
PTT Lupus Anticoagulant: 32.8 s (ref 0.0–51.9)

## 2020-11-16 LAB — BASIC METABOLIC PANEL
Anion gap: 8 (ref 5–15)
BUN: 10 mg/dL (ref 6–20)
CO2: 20 mmol/L — ABNORMAL LOW (ref 22–32)
Calcium: 8.2 mg/dL — ABNORMAL LOW (ref 8.9–10.3)
Chloride: 108 mmol/L (ref 98–111)
Creatinine, Ser: 0.81 mg/dL (ref 0.44–1.00)
GFR, Estimated: >60 mL/min (ref >60)
Glucose, Bld: 89 mg/dL (ref 70–99)
Potassium: 3.4 mmol/L — ABNORMAL LOW (ref 3.5–5.1)
Sodium: 136 mmol/L (ref 135–145)

## 2020-11-16 LAB — MPO/PR-3 (ANCA) ANTIBODIES
ANCA Proteinase 3: <3.5 U/mL (ref 0.0–3.5)
Myeloperoxidase Abs: <9 U/mL (ref 0.0–9.0)

## 2020-11-16 LAB — HOMOCYSTEINE: Homocysteine: 12.8 umol/L (ref 0.0–14.5)

## 2020-11-16 MED ORDER — POTASSIUM CHLORIDE 20 MEQ PO PACK
20.0000 meq | PACK | Freq: Once | ORAL | Status: AC
  Administered 2020-11-16: 20 meq via ORAL
  Filled 2020-11-16: qty 1

## 2020-11-16 NOTE — Progress Notes (Addendum)
STROKE TEAM PROGRESS NOTE   INTERVAL HISTORY No acute overnight events. Patient evaluated at bedside this morning, with mother present in the room.  Patient denies any new complaints and states she feels she is improving.  She states that she had her dinner yesterday and working on breakfast this morning.  D/C her IV fluids today.  Patient serum potassium 3.49, repleted with supplements.  Blood pressure well controlled.  Neurologically patient is improving.  Plan is to wait for CIR bed.  She is scheduled for TEE on 11/18/2020.  Vitals:   11/15/20 2005 11/15/20 2341 11/16/20 0419 11/16/20 0723  BP: (!) 95/55 116/60 123/66 118/68  Pulse: 73 80 82 76  Resp: 18 18 18 16   Temp: 99 F (37.2 C) 98.1 F (36.7 C) 99 F (37.2 C) 98.3 F (36.8 C)  TempSrc: Oral Oral Oral Oral  SpO2: 98% 98% 98% 98%   CBC:  Recent Labs  Lab 11/13/20 1023 11/13/20 1027 11/15/20 0613 11/16/20 0151  WBC 8.0  --  6.2 6.2  NEUTROABS 5.9  --   --   --   HGB 14.0   < > 10.5* 10.5*  HCT 41.9   < > 31.6* 30.0*  MCV 90.7  --  90.0 88.5  PLT 228  --  207 193   < > = values in this interval not displayed.   Basic Metabolic Panel:  Recent Labs  Lab 11/15/20 0613 11/16/20 0151  NA 140 136  K 3.5 3.4*  CL 109 108  CO2 20* 20*  GLUCOSE 85 89  BUN 10 10  CREATININE 0.80 0.81  CALCIUM 8.4* 8.2*   Lipid Panel:  Recent Labs  Lab 11/14/20 0549  CHOL 156  TRIG 111  HDL 38*  CHOLHDL 4.1  VLDL 22  LDLCALC 96   HgbA1c:  Recent Labs  Lab 11/14/20 0549  HGBA1C 5.1   Urine Drug Screen: Positive for THC  Hypercoagulable panel: Pending  IMAGING past 24 hours  CT Head code stroke wo contrast 11/13/2020  IMPRESSION: No acute intracranial hemorrhage. Acute infarction involving the right caudate, adjacent white matter, and superior lentiform nucleus. ASPECTS is 7.  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 Remainder of proximal intracranial vessels are patent.   Perfusion imaging demonstrates no evidence of core infarction despite findings on noncontrast head CT. Calculated territory at risk of 242 mL throughout the right MCA territory as well as MCA/ACA and MCA/PCA watershed territories.   No occlusion or hemodynamically significant stenosis in the neck.  CT head wo contrast 11/13/2020  IMPRESSION: 1. Unchanged acute right basal ganglia infarct. 2. No evidence of hemorrhage or other new intracranial abnormality.  Chest x-ray 11/13/2020  IMPRESSION: No active disease.  MR Brain  11/14/2020  1. 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 No associated hemorrhage or significant mass effect. 2. Otherwise normal brain MRI for age.  MRA Head  11/14/2020  MRA HEAD IMPRESSION:   1. Sequelae of recent stent placement within the petrous right ICA. Evaluation for implant stenosis limited by susceptibility artifact, however, widely patent flow seen distally within the right carotid siphon. 2. No other hemodynamically significant stenosis or other vascular abnormality within the intracranial circulation. 3. Fetal type origin of both PCAs with overall diminutive vertebrobasilar system.  VAS [place] lower Extremity  11/14/2020  Summary:  BILATERAL:  - No evidence of deep vein thrombosis seen in the lower extremities,  bilaterally.  -No evidence of popliteal cyst, bilaterally.   Echocardiogram 11/14/2020  1. Left ventricular ejection fraction, by estimation, is 60 to 65%. The  left ventricle has normal function. The left ventricle has no regional  wall motion abnormalities. Left ventricular diastolic parameters were  normal.   2. Right ventricular systolic function is normal. The right ventricular  size is normal. There  is normal pulmonary artery systolic pressure. The  estimated right ventricular systolic pressure is 06.2 mmHg.   3. The mitral valve is normal in structure. Trivial mitral valve  regurgitation. No evidence of mitral stenosis.   4. The aortic valve is tricuspid. Aortic valve regurgitation is not  visualized. No aortic stenosis is present.   5. The inferior vena cava is normal in size with greater than 50%  respiratory variability, suggesting right atrial pressure of 3 mmHg.    PHYSICAL EXAM  General: Well developed, obese young female lying comfortably in bed, NAD HEENT: [place]/AT, MMM, PERRLA Cardiovascular: Regular rate and rhythm  Respiratory: No respiratory distress Abdominal: Soft, nontender, no rigidity Psych: Normal mood and affect Neurological:  General: NAD Mental Status: Alert, oriented, thought content appropriate.  Speech fluent without evidence of aphasia.  Able to follow pull commands without difficulty. Cranial Nerves: II:  Visual fields grossly normal, pupils equal, round, reactive to light and accommodation III,IV, VI: ptosis not present, extra-ocular motions intact bilaterally, right gaze preference resolved V,VII: smile asymmetric, left facial droop,facial light touch sensation decreased on left around 70% as compared to 100% on right VIII: hearing normal bilaterally IX,X: uvula rises symmetrically XI: bilateral shoulder shrug XII: midline tongue extension without atrophy or fasciculations  Motor: Right : Upper extremity   5/5    Left:     Upper extremity   proximal 2/5, bicep 3 / 5, tricep 2/5,                                                                                                  finger grip 2/5  Lower extremity   5/5     Lower extremity   5/5 Tone and bulk:normal tone throughout; no atrophy noted Sensory: Pinprick and light touch decreased by 30% on left side. Deep Tendon Reflexes:  Right: Upper Extremity   Left: Upper extremity   biceps (C-5 to C-6)  2/4   biceps (C-5 to C-6) 2/4 tricep (C7) 2/4    triceps (C7) 2/4 Brachioradialis (C6) 2/4  Brachioradialis (C6) 2/4  Lower Extremity Lower Extremity  quadriceps (L-2 to L-4) 2/4   quadriceps (L-2 to L-4) 2/4 Achilles (S1) 2/4   Achilles (S1) 2/4  Plantars: Right: downgoing   Left: downgoing Cerebellar: normal finger-to-nose  Gait: Deferred    ASSESSMENT/PLAN [name] is a 40 y.o. female with PMH X of migraine, COVID-19 infection in 09/2020 presented to ED for left arm and left leg numbness.  Left arm> weaker than left leg since Sunday and worsening left facial droop and right gaze preference.  CT head  showed right caudate and basal ganglia subacute infarct no tPA administered, due to outside of window.  CT head and neck shows right ICA petrous segment occlusion versus high-grade stenosis.  CTP showed large penumbra on right and patient underwent successful thrombectomy.  MRI this morning shows patchy multifocal acute ischemic right MCA territory infarct involving the subcortical and deep white matter of right frontal and parietal lobes, largely watershed in distribution.  Echocardiogram pending today, planning to follow-up with TEE and TCD bubble study.  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 Code Stroke CT head : No acute intracranial hemorrhage. Acute infarction involving the right caudate, adjacent white matter, and superior lentiform nucleus. 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 CT perfusion: no core, large right penumbra MRI : Patchy multifocal acute ischemic right MCA territory infarcts involving the subcortical and deep white matter of the right frontal and parietal lobes, largely watershed in distribution. MRA : Sequelae of recent stent placement within the petrous right ICA. widely patent flow seen distally within the right  carotid siphon. 2D Echo: EF 60 to 65%. Doppler Lower extremities [place]: No evidence of deep vein thrombosis  TCD bubble study: Positive PFO with spencer degree 2 on both rest and valsalva TEE: Pending on 11/18/2020 LDL 96 HgbA1c 5.1 UDS + THC Hypercoagulable and autoimmune labs pending VTE prophylaxis - Lovenox 40 mg No antithrombotic prior to admission, now on aspirin 81 mg daily and Brilinta (ticagrelor) 90 mg bid.  Therapy recommendations: CIR Disposition: Pending  PFO Positive TCD bubble study with spencer degree 2 at rest and with valsalva TEE on 11/18/20 to further evaluation May consider PFO closure if no other source of stroke found  Hypertension Home meds: None BP fluctuate Long-term BP goal normotensive  Hyperlipidemia Home meds: None LDL 96, goal < 70 on lipitor 40 Continue statin at discharge  Anemia  Hb 14.0->10.5 Could be due to acute blood loss with procedure CBC monitoring  Other Stroke Risk Factors Obesity: Patient will be advised to exercise and lose weight. Past COVID-19 infection on 09/2020 Migraine   Hospital day # 3  ATTENDING NOTE: I reviewed above note and agree with the assessment and plan. Pt was seen and examined.   40 year old female with history of migraine and Covid infection in 09/2020 admitted for left-sided weakness, right gaze, left facial droop. CT found subacute right caudate and CR/BG infarcts. CT head and neck showed right petrous ICA occlusion with distal reconstitution. CTP large penumbra. Stat post EVD with right ICA petrous segment stenting. Currently on aspirin and Brilinta. 2D echo EF 60-65%, no DVT, LDL 96, A1c 5.1.  The patient did have Covid on Christmas Day 2021.  Awake alert, orientated x 3. No aphasia or dysarthria, following all simple commands. No gaze palsy, right gaze preference resolved. Visual field full. PERRL. Left facial droop. Tongue midline. Left UE proximal 2/5, bicep 3-/5, tricep 2/5, finger grip 2/5. Left LE  4+/5. Right UE and LE 5/5. Sensation symmetrical now and no sensory neglect.  Further stroke work up showed TCD bubble study spencer degree II with rest and valsalva. Will have TEE on 11/18/20 for further evaluation, may consider PFO closure if no other source of stroke found. Hb down from 14 to 10.5, will need close monitoring. Continue ASA and brinlinta and statin. PT/OT recommend CIR.   To contact Stroke Continuity provider, please  refer to [email or web address] After hours, contact General Neurology

## 2020-11-17 LAB — BASIC METABOLIC PANEL
Anion gap: 11 (ref 5–15)
BUN: 9 mg/dL (ref 6–20)
CO2: 20 mmol/L — ABNORMAL LOW (ref 22–32)
Calcium: 8.7 mg/dL — ABNORMAL LOW (ref 8.9–10.3)
Chloride: 106 mmol/L (ref 98–111)
Creatinine, Ser: 0.82 mg/dL (ref 0.44–1.00)
GFR, Estimated: >60 mL/min (ref >60)
Glucose, Bld: 101 mg/dL — ABNORMAL HIGH (ref 70–99)
Potassium: 3.4 mmol/L — ABNORMAL LOW (ref 3.5–5.1)
Sodium: 137 mmol/L (ref 135–145)

## 2020-11-17 LAB — BETA-2-GLYCOPROTEIN I ABS, IGG/M/A
Beta-2 Glyco I IgG: <9 GPI IgG units (ref 0–20)
Beta-2-Glycoprotein I IgA: <9 GPI IgA units (ref 0–25)
Beta-2-Glycoprotein I IgM: <9 GPI IgM units (ref 0–32)

## 2020-11-17 LAB — CARDIOLIPIN ANTIBODIES, IGG, IGM, IGA
Anticardiolipin IgA: <9 APL U/mL (ref 0–11)
Anticardiolipin IgG: <9 GPL U/mL (ref 0–14)
Anticardiolipin IgM: <9 MPL U/mL (ref 0–12)

## 2020-11-17 LAB — CBC
HCT: 31.6 % — ABNORMAL LOW (ref 36.0–46.0)
Hemoglobin: 11.4 g/dL — ABNORMAL LOW (ref 12.0–15.0)
MCH: 31.4 pg (ref 26.0–34.0)
MCHC: 36.1 g/dL — ABNORMAL HIGH (ref 30.0–36.0)
MCV: 87.1 fL (ref 80.0–100.0)
Platelets: 222 10*3/uL (ref 150–400)
RBC: 3.63 MIL/uL — ABNORMAL LOW (ref 3.87–5.11)
RDW: 12.5 % (ref 11.5–15.5)
WBC: 6.2 10*3/uL (ref 4.0–10.5)
nRBC: 0 % (ref 0.0–0.2)

## 2020-11-17 MED ORDER — ZOLPIDEM TARTRATE 5 MG PO TABS
5.0000 mg | ORAL_TABLET | Freq: Every day | ORAL | Status: DC
  Administered 2020-11-17 (×2): 5 mg via ORAL
  Filled 2020-11-17: qty 1

## 2020-11-17 NOTE — Progress Notes (Signed)
STROKE TEAM PROGRESS NOTE   INTERVAL HISTORY She reports that things are about the same. No change in function and in particular no worsening. She thinks that there may be some improvement.  Vitals:   11/16/20 2058 11/17/20 0005 11/17/20 0423 11/17/20 0802  BP: 112/65 120/76 (!) 98/54 (!) 102/56  Pulse: 86 75 93 77  Resp:  18 17 16   Temp: 98.3 F (36.8 C) 97.9 F (36.6 C) 98.2 F (36.8 C) 98.3 F (36.8 C)  TempSrc: Oral Oral Oral Oral  SpO2: 100% 95% 98% 97%   CBC:  Recent Labs  Lab 11/13/20 1023 11/13/20 1027 11/16/20 0151 11/17/20 0112  WBC 8.0   < > 6.2 6.2  NEUTROABS 5.9  --   --   --   HGB 14.0   < > 10.5* 11.4*  HCT 41.9   < > 30.0* 31.6*  MCV 90.7   < > 88.5 87.1  PLT 228   < > 193 222   < > = values in this interval not displayed.   Basic Metabolic Panel:  Recent Labs  Lab 11/16/20 0151 11/17/20 0112  NA 136 137  K 3.4* 3.4*  CL 108 106  CO2 20* 20*  GLUCOSE 89 101*  BUN 10 9  CREATININE 0.81 0.82  CALCIUM 8.2* 8.7*   Lipid Panel:  Recent Labs  Lab 11/14/20 0549  CHOL 156  TRIG 111  HDL 38*  CHOLHDL 4.1  VLDL 22  LDLCALC 96   HgbA1c:  Recent Labs  Lab 11/14/20 0549  HGBA1C 5.1   Urine Drug Screen: Positive for THC  Hypercoagulable panel: Pending  IMAGING past 24 hours  CT Head code stroke wo contrast 11/13/2020  IMPRESSION: No acute intracranial hemorrhage. Acute infarction involving the right caudate, adjacent white matter, and superior lentiform nucleus. ASPECTS is 7.  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 Remainder of proximal intracranial vessels are patent.   Perfusion imaging demonstrates no evidence of core infarction despite findings on noncontrast head CT. Calculated territory at risk of 242 mL throughout the right MCA territory as well as MCA/ACA and MCA/PCA watershed territories.   No occlusion or  hemodynamically significant stenosis in the neck.  CT head wo contrast 11/13/2020  IMPRESSION: 1. Unchanged acute right basal ganglia infarct. 2. No evidence of hemorrhage or other new intracranial abnormality.  Chest x-ray 11/13/2020  IMPRESSION: No active disease.  MR Brain  11/14/2020  1. 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 No associated hemorrhage or significant mass effect. 2. Otherwise normal brain MRI for age.  MRA Head  11/14/2020  MRA HEAD IMPRESSION:   1. Sequelae of recent stent placement within the petrous right ICA. Evaluation for implant stenosis limited by susceptibility artifact, however, widely patent flow seen distally within the right carotid siphon. 2. No other hemodynamically significant stenosis or other vascular abnormality within the intracranial circulation. 3. Fetal type origin of both PCAs with overall diminutive vertebrobasilar system.  VAS [place] lower Extremity 11/14/2020  Summary:  BILATERAL:  - No evidence of deep vein thrombosis seen in the lower extremities,  bilaterally.  -No evidence of popliteal cyst, bilaterally.   Echocardiogram 11/14/2020  1. Left ventricular ejection fraction, by estimation, is 60 to 65%. The  left ventricle has normal function. The left ventricle has no  regional  wall motion abnormalities. Left ventricular diastolic parameters were  normal.   2. Right ventricular systolic function is normal. The right ventricular  size is normal. There is normal pulmonary artery systolic pressure. The  estimated right ventricular systolic pressure is 16.1 mmHg.   3. The mitral valve is normal in structure. Trivial mitral valve  regurgitation. No evidence of mitral stenosis.   4. The aortic valve is tricuspid. Aortic valve regurgitation is not  visualized. No aortic stenosis is  present.   5. The inferior vena cava is normal in size with greater than 50%  respiratory variability, suggesting right atrial pressure of 3 mmHg.    PHYSICAL EXAM  General: Well developed, obese young female lying comfortably in bed, NAD HEENT: [place]/AT, MMM, PERRLA Cardiovascular: Regular rate and rhythm  Respiratory: No respiratory distress Abdominal: Soft, nontender, no rigidity Psych: Normal mood and affect Neurological:  General: NAD Mental Status: Alert, oriented, thought content appropriate.  Speech fluent without evidence of aphasia.  Able to follow pull commands without difficulty. Cranial Nerves: II:  Visual fields grossly normal, pupils equal, round, reactive to light and accommodation III,IV, VI: ptosis not present, extra-ocular motions intact bilaterally, right gaze preference resolved V,VII: smile asymmetric, left facial droop,facial light touch sensation decreased on left around 70% as compared to 100% on right VIII: hearing normal bilaterally IX,X: uvula rises symmetrically XI: bilateral shoulder shrug XII: midline tongue extension without atrophy or fasciculations  Motor: Right : Upper extremity   5/5    Left:     Upper extremity   proximal 2/5, bicep 3 / 5, tricep 3/5,                                                                                                  finger grip 2/5  Lower extremity   5/5     Lower extremity   5/5 Tone and bulk:normal tone throughout; no atrophy noted Sensory: Pinprick and light touch decreased by 30% on left side. Deep Tendon Reflexes:  Right: Upper Extremity   Left: Upper extremity   biceps (C-5 to C-6) 2/4   biceps (C-5 to C-6) 2/4 tricep (C7) 2/4    triceps (C7) 2/4 Brachioradialis (C6) 2/4  Brachioradialis (C6) 2/4  Lower Extremity Lower Extremity  quadriceps (L-2 to L-4) 2/4   quadriceps (L-2 to L-4) 2/4 Achilles (S1) 2/4   Achilles (S1) 2/4  Plantars: Right: downgoing   Left: downgoing Cerebellar: normal  finger-to-nose  Gait: Deferred    ASSESSMENT/PLAN Ms. Cayden Rautio is a 40 y.o. female with PMH X of migraine and COVID-19 infection in 09/2020 presented to ED for left arm and left leg numbness.  Left arm> weaker than left leg since Sunday and worsening left facial droop and right gaze preference.  CT head showed right caudate and basal ganglia subacute infarct no tPA administered, due to outside of window.  CT head and neck shows right ICA petrous segment occlusion versus high-grade stenosis.  CTP showed large penumbra on right and patient underwent successful thrombectomy.  MRI this morning shows patchy multifocal acute ischemic right MCA territory  infarct involving the subcortical and deep white matter of right frontal and parietal lobes, largely watershed in distribution.    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  Code Stroke CT head : No acute intracranial hemorrhage. Acute infarction involving the right caudate, adjacent white matter, and superior lentiform nucleus. 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  CT perfusion: no core, large right penumbra  MRI : Patchy multifocal acute ischemic right MCA territory infarcts involving the subcortical and deep white matter of the right frontal and parietal lobes, largely watershed in distribution.  MRA : Sequelae of recent stent placement within the petrous right ICA. widely patent flow seen distally within the right carotid siphon.  2D Echo: EF 60 to 65%.  Doppler Lower extremities [place]: No evidence of deep vein thrombosis   TCD bubble study: Positive PFO with spencer degree 2 on both rest and valsalva  TEE: Pending on 11/18/2020 - cardiology aware - will write NPO order.  LDL 96  HgbA1c 5.1  UDS + THC  Hypercoagulable and autoimmune labs pending (see below)  VTE prophylaxis - Lovenox  40 mg  No antithrombotic prior to admission, now on aspirin 81 mg daily and Brilinta (ticagrelor) 90 mg bid.   Therapy recommendations: CIR  Disposition: Pending  PFO  Positive TCD bubble study with spencer degree 2 at rest and with valsalva  TEE on Monday 11/18/20 to further evaluation  May consider PFO closure if no other source of stroke found  Hypertension  Home meds: None  BP fluctuate . Long-term BP goal normotensive  Hyperlipidemia  Home meds: None  LDL 96, goal < 70  On lipitor 40  Continue statin at discharge  Anemia   Hb 14.0->10.5->11.4  Could be due to acute blood loss with procedure  CBC monitoring  Other Stroke Risk Factors  Obesity: Patient will be advised to exercise and lose weight.  Past COVID-19 infection on 09/2020  Migraine   Additional Labs  Cardiolipin antibodies - < 9 - normal  Prothrombin Gene Mutation - pending   Lupus Anticoagulant - 32.8 - normal  Beta - 2 Glyco 1 IgG - < 9 - normal   Homocysteine - 12.8 - normal  DRVVT - 36 - normal  ANCA Proteinase 3 - < 3.5 - normal  ANCA titers - pending  ANA, IFA with reflex - pending   CRP - 0.6 - normal  Sed rate - 14 - normal  RPR - non reactive - normal    Hospital day # 4  ATTENDING NOTE: I reviewed above note and agree with the assessment and plan. Pt was seen and examined.   40 year old female with history of migraine and Covid infection in 09/2020 admitted for left-sided weakness, right gaze, left facial droop. CT found subacute right caudate and CR/BG infarcts. CT head and neck showed right petrous ICA occlusion with distal reconstitution. CTP large penumbra. Stat post EVD with right ICA petrous segment stenting. Currently on aspirin and Brilinta. 2D echo EF 60-65%, no DVT, LDL 96, A1c 5.1.  The patient did have Covid on Christmas Day 2021.  Awake alert, orientated x 3. No aphasia or dysarthria, following all simple commands. No gaze palsy, right gaze  preference resolved. Visual field full. PERRL. Left facial droop. Tongue midline. Left UE proximal 2/5, bicep 3-/5, tricep 2/5, finger grip 2/5. Left LE 4+/5. Right  UE and LE 5/5. Sensation symmetrical now and no sensory neglect.  Further stroke work up showed TCD bubble study spencer degree II with rest and valsalva. Will have TEE on 11/18/20 for further evaluation, may consider PFO closure if no other source of stroke found. Hb down from 14 to 10.5, will need close monitoring. Continue ASA and brinlinta and statin. PT/OT recommend CIR.   To contact Stroke Continuity provider, please refer to [email or web address] After hours, contact General Neurology

## 2020-11-17 NOTE — H&P (View-Only) (Signed)
STROKE TEAM PROGRESS NOTE   INTERVAL HISTORY She reports that things are about the same. No change in function and in particular no worsening. She thinks that there may be some improvement.  Vitals:   11/16/20 2058 11/17/20 0005 11/17/20 0423 11/17/20 0802  BP: 112/65 120/76 (!) 98/54 (!) 102/56  Pulse: 86 75 93 77  Resp:  18 17 16   Temp: 98.3 F (36.8 C) 97.9 F (36.6 C) 98.2 F (36.8 C) 98.3 F (36.8 C)  TempSrc: Oral Oral Oral Oral  SpO2: 100% 95% 98% 97%   CBC:  Recent Labs  Lab 11/13/20 1023 11/13/20 1027 11/16/20 0151 11/17/20 0112  WBC 8.0   < > 6.2 6.2  NEUTROABS 5.9  --   --   --   HGB 14.0   < > 10.5* 11.4*  HCT 41.9   < > 30.0* 31.6*  MCV 90.7   < > 88.5 87.1  PLT 228   < > 193 222   < > = values in this interval not displayed.   Basic Metabolic Panel:  Recent Labs  Lab 11/16/20 0151 11/17/20 0112  NA 136 137  K 3.4* 3.4*  CL 108 106  CO2 20* 20*  GLUCOSE 89 101*  BUN 10 9  CREATININE 0.81 0.82  CALCIUM 8.2* 8.7*   Lipid Panel:  Recent Labs  Lab 11/14/20 0549  CHOL 156  TRIG 111  HDL 38*  CHOLHDL 4.1  VLDL 22  LDLCALC 96   HgbA1c:  Recent Labs  Lab 11/14/20 0549  HGBA1C 5.1   Urine Drug Screen: Positive for THC  Hypercoagulable panel: Pending  IMAGING past 24 hours  CT Head code stroke wo contrast 11/13/2020  IMPRESSION: No acute intracranial hemorrhage. Acute infarction involving the right caudate, adjacent white matter, and superior lentiform nucleus. ASPECTS is 7.  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 Remainder of proximal intracranial vessels are patent.   Perfusion imaging demonstrates no evidence of core infarction despite findings on noncontrast head CT. Calculated territory at risk of 242 mL throughout the right MCA territory as well as MCA/ACA and MCA/PCA watershed territories.   No occlusion or  hemodynamically significant stenosis in the neck.  CT head wo contrast 11/13/2020  IMPRESSION: 1. Unchanged acute right basal ganglia infarct. 2. No evidence of hemorrhage or other new intracranial abnormality.  Chest x-ray 11/13/2020  IMPRESSION: No active disease.  MR Brain  11/14/2020  1. 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 No associated hemorrhage or significant mass effect. 2. Otherwise normal brain MRI for age.  MRA Head  11/14/2020  MRA HEAD IMPRESSION:   1. Sequelae of recent stent placement within the petrous right ICA. Evaluation for implant stenosis limited by susceptibility artifact, however, widely patent flow seen distally within the right carotid siphon. 2. No other hemodynamically significant stenosis or other vascular abnormality within the intracranial circulation. 3. Fetal type origin of both PCAs with overall diminutive vertebrobasilar system.  VAS [place] lower Extremity 11/14/2020  Summary:  BILATERAL:  - No evidence of deep vein thrombosis seen in the lower extremities,  bilaterally.  -No evidence of popliteal cyst, bilaterally.   Echocardiogram 11/14/2020  1. Left ventricular ejection fraction, by estimation, is 60 to 65%. The  left ventricle has normal function. The left ventricle has no  regional  wall motion abnormalities. Left ventricular diastolic parameters were  normal.   2. Right ventricular systolic function is normal. The right ventricular  size is normal. There is normal pulmonary artery systolic pressure. The  estimated right ventricular systolic pressure is 16.1 mmHg.   3. The mitral valve is normal in structure. Trivial mitral valve  regurgitation. No evidence of mitral stenosis.   4. The aortic valve is tricuspid. Aortic valve regurgitation is not  visualized. No aortic stenosis is  present.   5. The inferior vena cava is normal in size with greater than 50%  respiratory variability, suggesting right atrial pressure of 3 mmHg.    PHYSICAL EXAM  General: Well developed, obese young female lying comfortably in bed, NAD HEENT: [place]/AT, MMM, PERRLA Cardiovascular: Regular rate and rhythm  Respiratory: No respiratory distress Abdominal: Soft, nontender, no rigidity Psych: Normal mood and affect Neurological:  General: NAD Mental Status: Alert, oriented, thought content appropriate.  Speech fluent without evidence of aphasia.  Able to follow pull commands without difficulty. Cranial Nerves: II:  Visual fields grossly normal, pupils equal, round, reactive to light and accommodation III,IV, VI: ptosis not present, extra-ocular motions intact bilaterally, right gaze preference resolved V,VII: smile asymmetric, left facial droop,facial light touch sensation decreased on left around 70% as compared to 100% on right VIII: hearing normal bilaterally IX,X: uvula rises symmetrically XI: bilateral shoulder shrug XII: midline tongue extension without atrophy or fasciculations  Motor: Right : Upper extremity   5/5    Left:     Upper extremity   proximal 2/5, bicep 3 / 5, tricep 3/5,                                                                                                  finger grip 2/5  Lower extremity   5/5     Lower extremity   5/5 Tone and bulk:normal tone throughout; no atrophy noted Sensory: Pinprick and light touch decreased by 30% on left side. Deep Tendon Reflexes:  Right: Upper Extremity   Left: Upper extremity   biceps (C-5 to C-6) 2/4   biceps (C-5 to C-6) 2/4 tricep (C7) 2/4    triceps (C7) 2/4 Brachioradialis (C6) 2/4  Brachioradialis (C6) 2/4  Lower Extremity Lower Extremity  quadriceps (L-2 to L-4) 2/4   quadriceps (L-2 to L-4) 2/4 Achilles (S1) 2/4   Achilles (S1) 2/4  Plantars: Right: downgoing   Left: downgoing Cerebellar: normal  finger-to-nose  Gait: Deferred    ASSESSMENT/PLAN Ms. Cayden Rautio is a 40 y.o. female with PMH X of migraine and COVID-19 infection in 09/2020 presented to ED for left arm and left leg numbness.  Left arm> weaker than left leg since Sunday and worsening left facial droop and right gaze preference.  CT head showed right caudate and basal ganglia subacute infarct no tPA administered, due to outside of window.  CT head and neck shows right ICA petrous segment occlusion versus high-grade stenosis.  CTP showed large penumbra on right and patient underwent successful thrombectomy.  MRI this morning shows patchy multifocal acute ischemic right MCA territory  infarct involving the subcortical and deep white matter of right frontal and parietal lobes, largely watershed in distribution.    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  Code Stroke CT head : No acute intracranial hemorrhage. Acute infarction involving the right caudate, adjacent white matter, and superior lentiform nucleus. 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  CT perfusion: no core, large right penumbra  MRI : Patchy multifocal acute ischemic right MCA territory infarcts involving the subcortical and deep white matter of the right frontal and parietal lobes, largely watershed in distribution.  MRA : Sequelae of recent stent placement within the petrous right ICA. widely patent flow seen distally within the right carotid siphon.  2D Echo: EF 60 to 65%.  Doppler Lower extremities [place]: No evidence of deep vein thrombosis   TCD bubble study: Positive PFO with spencer degree 2 on both rest and valsalva  TEE: Pending on 11/18/2020 - cardiology aware - will write NPO order.  LDL 96  HgbA1c 5.1  UDS + THC  Hypercoagulable and autoimmune labs pending (see below)  VTE prophylaxis - Lovenox  40 mg  No antithrombotic prior to admission, now on aspirin 81 mg daily and Brilinta (ticagrelor) 90 mg bid.   Therapy recommendations: CIR  Disposition: Pending  PFO  Positive TCD bubble study with spencer degree 2 at rest and with valsalva  TEE on Monday 11/18/20 to further evaluation  May consider PFO closure if no other source of stroke found  Hypertension  Home meds: None  BP fluctuate . Long-term BP goal normotensive  Hyperlipidemia  Home meds: None  LDL 96, goal < 70  On lipitor 40  Continue statin at discharge  Anemia   Hb 14.0->10.5->11.4  Could be due to acute blood loss with procedure  CBC monitoring  Other Stroke Risk Factors  Obesity: Patient will be advised to exercise and lose weight.  Past COVID-19 infection on 09/2020  Migraine   Additional Labs  Cardiolipin antibodies - < 9 - normal  Prothrombin Gene Mutation - pending   Lupus Anticoagulant - 32.8 - normal  Beta - 2 Glyco 1 IgG - < 9 - normal   Homocysteine - 12.8 - normal  DRVVT - 36 - normal  ANCA Proteinase 3 - < 3.5 - normal  ANCA titers - pending  ANA, IFA with reflex - pending   CRP - 0.6 - normal  Sed rate - 14 - normal  RPR - non reactive - normal    Hospital day # 4  ATTENDING NOTE: I reviewed above note and agree with the assessment and plan. Pt was seen and examined.   40 year old female with history of migraine and Covid infection in 09/2020 admitted for left-sided weakness, right gaze, left facial droop. CT found subacute right caudate and CR/BG infarcts. CT head and neck showed right petrous ICA occlusion with distal reconstitution. CTP large penumbra. Stat post EVD with right ICA petrous segment stenting. Currently on aspirin and Brilinta. 2D echo EF 60-65%, no DVT, LDL 96, A1c 5.1.  The patient did have Covid on Christmas Day 2021.  Awake alert, orientated x 3. No aphasia or dysarthria, following all simple commands. No gaze palsy, right gaze  preference resolved. Visual field full. PERRL. Left facial droop. Tongue midline. Left UE proximal 2/5, bicep 3-/5, tricep 2/5, finger grip 2/5. Left LE 4+/5. Right  UE and LE 5/5. Sensation symmetrical now and no sensory neglect.  Further stroke work up showed TCD bubble study spencer degree II with rest and valsalva. Will have TEE on 11/18/20 for further evaluation, may consider PFO closure if no other source of stroke found. Hb down from 14 to 10.5, will need close monitoring. Continue ASA and brinlinta and statin. PT/OT recommend CIR.   To contact Stroke Continuity provider, please refer to [email or web address] After hours, contact General Neurology

## 2020-11-18 ENCOUNTER — Encounter (HOSPITAL_COMMUNITY)
Admission: EM | Disposition: A | Discharge status: Rehabilitation Facility | Payer: Self-pay | Source: Home / Self Care | Attending: Neurology

## 2020-11-18 ENCOUNTER — Inpatient Hospital Stay (HOSPITAL_COMMUNITY): Payer: BC Managed Care – PPO | Admitting: Anesthesiology

## 2020-11-18 ENCOUNTER — Encounter (HOSPITAL_COMMUNITY): Payer: Self-pay | Admitting: Physical Medicine & Rehabilitation

## 2020-11-18 ENCOUNTER — Other Ambulatory Visit: Payer: Self-pay

## 2020-11-18 ENCOUNTER — Encounter (HOSPITAL_COMMUNITY): Payer: Self-pay | Admitting: Neurology

## 2020-11-18 ENCOUNTER — Inpatient Hospital Stay (HOSPITAL_COMMUNITY): Payer: BC Managed Care – PPO

## 2020-11-18 ENCOUNTER — Inpatient Hospital Stay (HOSPITAL_COMMUNITY)
Admission: RE | Admit: 2020-11-18 | Discharge: 2020-11-22 | DRG: 057 | Disposition: A | Discharge status: Home / Self Care | Payer: BC Managed Care – PPO | Source: Intra-hospital | Attending: Physical Medicine & Rehabilitation | Admitting: Physical Medicine & Rehabilitation

## 2020-11-18 DIAGNOSIS — E785 Hyperlipidemia, unspecified: Secondary | ICD-10-CM | POA: Diagnosis present

## 2020-11-18 DIAGNOSIS — K5901 Slow transit constipation: Secondary | ICD-10-CM | POA: Diagnosis present

## 2020-11-18 DIAGNOSIS — Z98891 History of uterine scar from previous surgery: Secondary | ICD-10-CM

## 2020-11-18 DIAGNOSIS — Z87891 Personal history of nicotine dependence: Secondary | ICD-10-CM

## 2020-11-18 DIAGNOSIS — Z87442 Personal history of urinary calculi: Secondary | ICD-10-CM

## 2020-11-18 DIAGNOSIS — E669 Obesity, unspecified: Secondary | ICD-10-CM | POA: Diagnosis present

## 2020-11-18 DIAGNOSIS — I63511 Cerebral infarction due to unspecified occlusion or stenosis of right middle cerebral artery: Principal | ICD-10-CM

## 2020-11-18 DIAGNOSIS — Z882 Allergy status to sulfonamides status: Secondary | ICD-10-CM | POA: Diagnosis not present

## 2020-11-18 DIAGNOSIS — Z8616 Personal history of COVID-19: Secondary | ICD-10-CM | POA: Diagnosis not present

## 2020-11-18 DIAGNOSIS — E6609 Other obesity due to excess calories: Secondary | ICD-10-CM

## 2020-11-18 DIAGNOSIS — G8194 Hemiplegia, unspecified affecting left nondominant side: Secondary | ICD-10-CM | POA: Diagnosis not present

## 2020-11-18 DIAGNOSIS — Z95820 Peripheral vascular angioplasty status with implants and grafts: Secondary | ICD-10-CM | POA: Diagnosis not present

## 2020-11-18 DIAGNOSIS — F1210 Cannabis abuse, uncomplicated: Secondary | ICD-10-CM | POA: Diagnosis not present

## 2020-11-18 DIAGNOSIS — Z811 Family history of alcohol abuse and dependence: Secondary | ICD-10-CM | POA: Diagnosis not present

## 2020-11-18 DIAGNOSIS — D62 Acute posthemorrhagic anemia: Secondary | ICD-10-CM | POA: Diagnosis not present

## 2020-11-18 DIAGNOSIS — E441 Mild protein-calorie malnutrition: Secondary | ICD-10-CM | POA: Diagnosis not present

## 2020-11-18 DIAGNOSIS — E8809 Other disorders of plasma-protein metabolism, not elsewhere classified: Secondary | ICD-10-CM | POA: Diagnosis present

## 2020-11-18 DIAGNOSIS — I69354 Hemiplegia and hemiparesis following cerebral infarction affecting left non-dominant side: Secondary | ICD-10-CM | POA: Diagnosis not present

## 2020-11-18 DIAGNOSIS — Z8249 Family history of ischemic heart disease and other diseases of the circulatory system: Secondary | ICD-10-CM

## 2020-11-18 DIAGNOSIS — Z7151 Drug abuse counseling and surveillance of drug abuser: Secondary | ICD-10-CM | POA: Diagnosis not present

## 2020-11-18 DIAGNOSIS — Z79899 Other long term (current) drug therapy: Secondary | ICD-10-CM

## 2020-11-18 DIAGNOSIS — Z6830 Body mass index (BMI) 30.0-30.9, adult: Secondary | ICD-10-CM

## 2020-11-18 DIAGNOSIS — I639 Cerebral infarction, unspecified: Secondary | ICD-10-CM

## 2020-11-18 DIAGNOSIS — E46 Unspecified protein-calorie malnutrition: Secondary | ICD-10-CM

## 2020-11-18 HISTORY — PX: TEE WITHOUT CARDIOVERSION: SHX5443

## 2020-11-18 HISTORY — PX: BUBBLE STUDY: SHX6837

## 2020-11-18 LAB — CBC
HCT: 31.7 % — ABNORMAL LOW (ref 36.0–46.0)
Hemoglobin: 11.3 g/dL — ABNORMAL LOW (ref 12.0–15.0)
MCH: 31.1 pg (ref 26.0–34.0)
MCHC: 35.6 g/dL (ref 30.0–36.0)
MCV: 87.3 fL (ref 80.0–100.0)
Platelets: 216 10*3/uL (ref 150–400)
RBC: 3.63 MIL/uL — ABNORMAL LOW (ref 3.87–5.11)
RDW: 12.7 % (ref 11.5–15.5)
WBC: 6.5 10*3/uL (ref 4.0–10.5)
nRBC: 0 % (ref 0.0–0.2)

## 2020-11-18 LAB — BASIC METABOLIC PANEL
Anion gap: 10 (ref 5–15)
BUN: 10 mg/dL (ref 6–20)
CO2: 20 mmol/L — ABNORMAL LOW (ref 22–32)
Calcium: 9 mg/dL (ref 8.9–10.3)
Chloride: 106 mmol/L (ref 98–111)
Creatinine, Ser: 0.81 mg/dL (ref 0.44–1.00)
GFR, Estimated: >60 mL/min (ref >60)
Glucose, Bld: 95 mg/dL (ref 70–99)
Potassium: 3.3 mmol/L — ABNORMAL LOW (ref 3.5–5.1)
Sodium: 136 mmol/L (ref 135–145)

## 2020-11-18 LAB — ANTINUCLEAR ANTIBODIES, IFA: ANA Ab, IFA: NEGATIVE

## 2020-11-18 SURGERY — ECHOCARDIOGRAM, TRANSESOPHAGEAL
Anesthesia: Monitor Anesthesia Care

## 2020-11-18 MED ORDER — LIDOCAINE 2% (20 MG/ML) 5 ML SYRINGE
INTRAMUSCULAR | Status: DC | PRN
  Administered 2020-11-18: 80 mg via INTRAVENOUS

## 2020-11-18 MED ORDER — ACETAMINOPHEN 650 MG RE SUPP: 650.0000 mg | RECTAL | Status: DC | PRN

## 2020-11-18 MED ORDER — ENOXAPARIN SODIUM 40 MG/0.4ML ~~LOC~~ SOLN: 40.0000 mg | SUBCUTANEOUS | Status: DC

## 2020-11-18 MED ORDER — SENNOSIDES-DOCUSATE SODIUM 8.6-50 MG PO TABS
1.0000 | ORAL_TABLET | Freq: Every evening | ORAL | Status: DC | PRN
  Administered 2020-11-19: 1 via ORAL
  Filled 2020-11-18: qty 1

## 2020-11-18 MED ORDER — ENOXAPARIN SODIUM 40 MG/0.4ML ~~LOC~~ SOLN
40.0000 mg | SUBCUTANEOUS | Status: DC
  Administered 2020-11-19 – 2020-11-22 (×4): 40 mg via SUBCUTANEOUS
  Filled 2020-11-18 (×4): qty 0.4

## 2020-11-18 MED ORDER — SODIUM CHLORIDE 0.9 % IV SOLN: INTRAVENOUS | Status: DC

## 2020-11-18 MED ORDER — PROPOFOL 10 MG/ML IV BOLUS
INTRAVENOUS | Status: DC | PRN
  Administered 2020-11-18 (×2): 20 mg via INTRAVENOUS

## 2020-11-18 MED ORDER — TICAGRELOR 90 MG PO TABS
90.0000 mg | ORAL_TABLET | Freq: Two times a day (BID) | ORAL | Status: DC
  Administered 2020-11-18 – 2020-11-22 (×7): 90 mg via ORAL
  Filled 2020-11-18 (×8): qty 1

## 2020-11-18 MED ORDER — PANTOPRAZOLE SODIUM 40 MG PO TBEC
40.0000 mg | DELAYED_RELEASE_TABLET | Freq: Every day | ORAL | Status: DC
  Administered 2020-11-19 – 2020-11-22 (×4): 40 mg via ORAL
  Filled 2020-11-18 (×4): qty 1

## 2020-11-18 MED ORDER — GLYCOPYRROLATE PF 0.2 MG/ML IJ SOSY
PREFILLED_SYRINGE | INTRAMUSCULAR | Status: DC | PRN
  Administered 2020-11-18 (×2): .1 mg via INTRAVENOUS

## 2020-11-18 MED ORDER — PROPOFOL 500 MG/50ML IV EMUL
INTRAVENOUS | Status: DC | PRN
  Administered 2020-11-18: 125 ug/kg/min via INTRAVENOUS

## 2020-11-18 MED ORDER — POLYVINYL ALCOHOL 1.4 % OP SOLN
1.0000 [drp] | OPHTHALMIC | Status: DC | PRN
  Filled 2020-11-18: qty 15

## 2020-11-18 MED ORDER — ASPIRIN 81 MG PO CHEW
81.0000 mg | CHEWABLE_TABLET | Freq: Every day | ORAL | Status: DC
  Administered 2020-11-19 – 2020-11-22 (×4): 81 mg via ORAL
  Filled 2020-11-18 (×4): qty 1

## 2020-11-18 MED ORDER — LACTATED RINGERS IV SOLN: INTRAVENOUS | Status: DC | PRN

## 2020-11-18 MED ORDER — STROKE: EARLY STAGES OF RECOVERY BOOK
Status: AC
  Filled 2020-11-18: qty 1

## 2020-11-18 MED ORDER — ACETAMINOPHEN 160 MG/5ML PO SOLN: 650.0000 mg | ORAL | Status: DC | PRN

## 2020-11-18 MED ORDER — TICAGRELOR 90 MG PO TABS
90.0000 mg | ORAL_TABLET | Freq: Two times a day (BID) | ORAL | Status: DC
  Administered 2020-11-20: 90 mg
  Filled 2020-11-18: qty 1

## 2020-11-18 MED ORDER — BUTAMBEN-TETRACAINE-BENZOCAINE 2-2-14 % EX AERO
INHALATION_SPRAY | CUTANEOUS | Status: DC | PRN
  Administered 2020-11-18: 1 via TOPICAL

## 2020-11-18 MED ORDER — ATORVASTATIN CALCIUM 40 MG PO TABS
40.0000 mg | ORAL_TABLET | Freq: Every day | ORAL | Status: DC
  Administered 2020-11-19 – 2020-11-22 (×4): 40 mg via ORAL
  Filled 2020-11-18 (×4): qty 1

## 2020-11-18 MED ORDER — ACETAMINOPHEN 325 MG PO TABS: 650.0000 mg | ORAL_TABLET | ORAL | Status: DC | PRN

## 2020-11-18 MED ORDER — DEXMEDETOMIDINE (PRECEDEX) IN NS 20 MCG/5ML (4 MCG/ML) IV SYRINGE
PREFILLED_SYRINGE | INTRAVENOUS | Status: DC | PRN
  Administered 2020-11-18: 12 ug via INTRAVENOUS

## 2020-11-18 MED ORDER — ASPIRIN 81 MG PO CHEW: 81.0000 mg | CHEWABLE_TABLET | Freq: Every day | ORAL | Status: DC

## 2020-11-18 MED ORDER — TRAZODONE HCL 50 MG PO TABS
25.0000 mg | ORAL_TABLET | Freq: Once | ORAL | Status: AC
  Administered 2020-11-18: 25 mg via ORAL
  Filled 2020-11-18: qty 1

## 2020-11-18 NOTE — Anesthesia Preprocedure Evaluation (Addendum)
Anesthesia Evaluation  Patient identified by MRN, date of birth, ID band Patient awake    Reviewed: Allergy & Precautions, NPO status , Patient's Chart, lab work & pertinent test results  History of Anesthesia Complications Negative for: history of anesthetic complications  Airway Mallampati: II  TM Distance: >3 FB Neck ROM: Full    Dental  (+) Dental Advisory Given, Teeth Intact   Pulmonary former smoker,    Pulmonary exam normal        Cardiovascular negative cardio ROS Normal cardiovascular exam   '22 TTE - EF 60 to 65%. Trivial mitral valve regurgitation.    Neuro/Psych  Headaches, CVA, Residual Symptoms negative psych ROS   GI/Hepatic negative GI ROS, Neg liver ROS,   Endo/Other   K 3.3   Renal/GU negative Renal ROS     Musculoskeletal negative musculoskeletal ROS (+)   Abdominal   Peds  Hematology  (+) anemia ,   Anesthesia Other Findings Covid+ 09/2020 Covid test negative   Reproductive/Obstetrics                            Anesthesia Physical Anesthesia Plan  ASA: IV  Anesthesia Plan: MAC   Post-op Pain Management:    Induction: Intravenous  PONV Risk Score and Plan: 2 and Propofol infusion and Treatment may vary due to age or medical condition  Airway Management Planned: Nasal Cannula and Natural Airway  Additional Equipment: None  Intra-op Plan:   Post-operative Plan:   Informed Consent: I have reviewed the patients History and Physical, chart, labs and discussed the procedure including the risks, benefits and alternatives for the proposed anesthesia with the patient or authorized representative who has indicated his/her understanding and acceptance.       Plan Discussed with: CRNA and Anesthesiologist  Anesthesia Plan Comments:        Anesthesia Quick Evaluation

## 2020-11-18 NOTE — H&P (Signed)
Physical Medicine and Rehabilitation Admission H&P       HPI: Dana Henry is a 40 year old right-handed female history of migraine headaches, Covid infection 09/2020 and remote tobacco abuse. Patient lives with spouse and 2 young daughters. Independent prior to admission. Two-level home bed and bath on main level. Presented 11/13/2020 with acute onset of left-sided weakness. She was noted to have a low-grade fever. Admission chemistries unremarkable except creatinine 1.06, WBC 8000, hemoglobin 14, SARS coronavirus negative, urine drug screen positive marijuana. Cranial CT scan showed no acute intracranial hemorrhage. Acute infarct involving the right caudate, adjacent white matter and superior lentiform nucleus. Patient did not receive TPA. 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 Remainder of proximal intracranial vessels were patent. No occlusion or hemodynamically significant stenosis in the neck. Patient underwent cerebral arteriogram with emergent stent assisted angioplasty of the right ICA per interventional radiology. Echocardiogram with ejection fraction of 60 to 65%. No wall motion abnormalities. 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 No associated hemorrhage or mass-effect. Patient currently maintained on aspirin as well as Brilinta for CVA prophylaxis. Subcutaneous Lovenox for DVT prophylaxis. Initially on Cleviprex for blood pressure control. Plan for TEE 11/18/2020 showed no hemodynamically significant valvular heart disease.  No LA appendage or thrombus.  No LV apical thrombus.. Due to patient decreased functional ability left side weakness she was admitted for a comprehensive rehab program.   Review of Systems  Constitutional: Positive for malaise/fatigue. Negative  for chills and fever.  HENT: Negative for hearing loss.   Eyes: Negative for blurred vision and double vision.  Respiratory: Negative for cough and shortness of breath.   Cardiovascular: Negative for chest pain, palpitations and leg swelling.  Gastrointestinal: Positive for constipation. Negative for heartburn, nausea and vomiting.  Genitourinary: Negative for dysuria, flank pain and hematuria.  Musculoskeletal: Positive for myalgias.  Skin: Negative for rash.  Neurological: Positive for weakness and headaches.  All other systems reviewed and are negative.       Past Medical History:  Diagnosis Date  . Anemia    . History of cesarean delivery affecting pregnancy 08/10/2017  . Kidney stones      surgically removed at 40YO and 40YO  . Migraines      associated with menstrual cycle  . UTI (urinary tract infection)    . Vaginal Pap smear, abnormal           Past Surgical History:  Procedure Laterality Date  . CESAREAN SECTION N/A 07/21/2014    Procedure: CESAREAN SECTION;  Surgeon: Elveria Royals, MD;  Location: [facility];  Service: Obstetrics;  Laterality: N/A;  . CESAREAN SECTION N/A 08/10/2017    Procedure: Repeat CESAREAN SECTION;  Surgeon: Azucena Fallen, MD;  Location: [facility];  Service: Obstetrics;  Laterality: N/A;  EDD: 08/17/17 Allergy: Sulfa  . KIDNEY STONE SURGERY      . RADIOLOGY WITH ANESTHESIA N/A 11/13/2020    Procedure: IR WITH ANESTHESIA;  Surgeon: Radiologist, Medication, MD;  Location: [place];  Service: Radiology;  Laterality: N/A;  . TONSILLECTOMY AND ADENOIDECTOMY   1991         Family History  Problem Relation Age of Onset  . Hypertension Mother    . Alcohol abuse Father      Social History:  reports that she quit smoking about  7 years ago. Her smoking use included cigarettes. She smoked 0.50 packs per day. She has never used smokeless tobacco. She reports current alcohol use. She reports that she does not use drugs. Allergies:      Allergies   Allergen Reactions  . Sulfa Antibiotics Hives and Swelling          Medications Prior to Admission  Medication Sig Dispense Refill  . ibuprofen (ADVIL) 200 MG tablet Take 400 mg by mouth every 6 (six) hours as needed for mild pain or headache.      . Multiple Vitamins-Minerals (MULTIVITAMIN WITH MINERALS) tablet Take 1 tablet by mouth daily.          Drug Regimen Review Drug regimen was reviewed and remains appropriate with no significant issues identified   Home: Home Living Family/patient expects to be discharged to:: Private residence Living Arrangements: Spouse/significant other,Children Available Help at Discharge: Family Type of Home: House Home Access: Level entry Home Layout: Two level,Bed/bath upstairs,Able to live on main level with bedroom/bathroom (her bedroom upstairs; can stay on first floor) Alternate Level Stairs-Number of Steps: 13 Alternate Level Stairs-Rails: Right Bathroom Shower/Tub: Multimedia programmer: Standard Home Equipment: None   Functional History: Prior Function Level of Independence: Independent 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 Cued pt to sldie legs off R EOB and grab R rail with R hand to transition onto R elbow > hand to ascend trunk, needing minA to initiate but once elbow was under her torso she completed transition to sit. MinA to manage L leg and arm back into bed safely. Transfers Overall transfer level: Needs assistance Equipment used: Rolling walker (2 wheeled) Transfers: Sit to/from Stand Sit to Stand: Min assist,From elevated surface General transfer comment: L UE on RW to start with R hand on bed to push up, minA for steadying and  maintaining L hand contact with RW. Ambulation/Gait Ambulation/Gait assistance: Min assist Gait Distance (Feet): 60 Feet Assistive device: Rolling walker (2 wheeled) Gait Pattern/deviations: Step-through pattern,Decreased stride length,Decreased step length - left,Decreased dorsiflexion - left General Gait Details: Pt tends to look to R. Needed cues to turn either direction to safely manage lines. Hand-over-hand intermittently and repositioning of L hand on RW, poor awareness when it would slip out of position. Decreased L step length needing tactile and verbal cues to minimally correct. Repeated cues for sequencing RW and feet movement with turning and to remain within RW. 1 LOB posteriorly, needing minA to recover. Gait velocity: decr Gait velocity interpretation: <1.31 ft/sec, indicative of household ambulator   ADL: ADL Overall ADL's : Needs assistance/impaired Eating/Feeding: NPO Grooming: Minimal assistance,Sitting Grooming Details (indicate cue type and reason): requires assist for BUE tasks, able to use RUE normally Upper Body Bathing: Moderate assistance,Sitting Lower Body Bathing: Maximal assistance Upper Body Dressing : Maximal assistance Lower Body Dressing: Total assistance Toilet Transfer: Moderate assistance,+2 for physical assistance,+2 for safety/equipment,Stand-pivot,BSC Toilet Transfer Details (indicate cue type and reason): 2 person HHA Toileting- Clothing Manipulation and Hygiene: Moderate assistance,+2 for physical assistance,+2 for safety/equipment,Sit to/from stand Functional mobility during ADLs: +2 for physical assistance,+2 for safety/equipment,Minimal assistance (2 person HHA) General ADL Comments: L inattention   Cognition: Cognition Overall Cognitive Status: Impaired/Different from baseline Orientation Level: Oriented X4  Cognition Arousal/Alertness: Awake/alert Behavior During Therapy: WFL for tasks assessed/performed Overall Cognitive Status:  Impaired/Different from baseline Area of Impairment: Problem solving Problem Solving: Slow processing,Difficulty sequencing,Requires verbal cues General Comments: Slow processing with cues, needing VCs to sequence RW and feet with turns.   Physical Exam: Blood pressure (!) 112/57, pulse 77, temperature 98 F (36.7 C), temperature source Oral, resp. rate 18, last menstrual period 11/05/2020, SpO2 98 %, currently breastfeeding. Physical Exam Constitutional:      General: She is not in acute distress.    Appearance: Normal appearance.  HENT:     Head: Normocephalic and atraumatic.     Right Ear: External ear normal.     Left Ear: External ear normal.     Nose: Nose normal.     Mouth/Throat:     Mouth: Mucous membranes are moist.     Pharynx: Oropharynx is clear. No oropharyngeal exudate.  Eyes:     Extraocular Movements: Extraocular movements intact.     Pupils: Pupils are equal, round, and reactive to light.  Cardiovascular:     Rate and Rhythm: Normal rate and regular rhythm.     Heart sounds: No murmur heard. No gallop.   Pulmonary:     Effort: Pulmonary effort is normal. No respiratory distress.     Breath sounds: No wheezing, rhonchi or rales.  Abdominal:     General: Bowel sounds are normal. There is no distension.     Palpations: Abdomen is soft.     Tenderness: There is no abdominal tenderness.  Musculoskeletal:        General: No swelling or tenderness. Normal range of motion.     Cervical back: Normal range of motion and neck supple.  Skin:    General: Skin is warm and dry.  Neurological:     Mental Status: She is alert.     Comments: Alert and oriented x 3. Normal insight and awareness. Intact Memory. Normal language and speech. Cranial nerve exam unremarkable Patient is alert in no acute distress. Makes eye contact with examiner. Follows commands. LUE 3-/5 prox to 3/5 distally. LLE 4/5 prox to distal. No focal sensory deficits. DTR's1+  Psychiatric:        Mood and  Affect: Mood normal.        Behavior: Behavior normal.        Thought Content: Thought content normal.        Judgment: Judgment normal.        Lab Results Last 48 Hours        Results for orders placed or performed during the hospital encounter of 11/13/20 (from the past 48 hour(s))  CBC     Status: Abnormal    Collection Time: 11/17/20  1:12 AM  Result Value Ref Range    WBC 6.2 4.0 - 10.5 K/uL    RBC 3.63 (L) 3.87 - 5.11 MIL/uL    Hemoglobin 11.4 (L) 12.0 - 15.0 g/dL    HCT 31.6 (L) 36.0 - 46.0 %    MCV 87.1 80.0 - 100.0 fL    MCH 31.4 26.0 - 34.0 pg    MCHC 36.1 (H) 30.0 - 36.0 g/dL    RDW 12.5 11.5 - 15.5 %    Platelets 222 150 - 400 K/uL    nRBC 0.0 0.0 - 0.2 %      Comment: Performed at [facility], [place] [address], [place], [place] [address]  Basic metabolic panel     Status: Abnormal  Collection Time: 11/17/20  1:12 AM  Result Value Ref Range    Sodium 137 135 - 145 mmol/L    Potassium 3.4 (L) 3.5 - 5.1 mmol/L    Chloride 106 98 - 111 mmol/L    CO2 20 (L) 22 - 32 mmol/L    Glucose, Bld 101 (H) 70 - 99 mg/dL      Comment: Glucose reference range applies only to samples taken after fasting for at least 8 hours.    BUN 9 6 - 20 mg/dL    Creatinine, Ser 0.82 0.44 - 1.00 mg/dL    Calcium 8.7 (L) 8.9 - 10.3 mg/dL    GFR, Estimated >60 >60 mL/min      Comment: (NOTE) Calculated using the CKD-EPI Creatinine Equation (2021)      Anion gap 11 5 - 15      Comment: Performed at [facility] [address], [place], [place] [address]  CBC     Status: Abnormal    Collection Time: 11/18/20  1:54 AM  Result Value Ref Range    WBC 6.5 4.0 - 10.5 K/uL    RBC 3.63 (L) 3.87 - 5.11 MIL/uL    Hemoglobin 11.3 (L) 12.0 - 15.0 g/dL    HCT 31.7 (L) 36.0 - 46.0 %    MCV 87.3 80.0 - 100.0 fL    MCH 31.1 26.0 - 34.0 pg    MCHC 35.6 30.0 - 36.0 g/dL    RDW 12.7 11.5 - 15.5 %    Platelets 216 150 - 400 K/uL    nRBC 0.0 0.0 - 0.2 %      Comment: Performed at [facility], [place] [address] [place], [place] [id number]  Basic metabolic panel     Status: Abnormal    Collection Time: 11/18/20  1:54 AM  Result Value Ref Range    Sodium 136 135 - 145 mmol/L    Potassium 3.3 (L) 3.5 - 5.1 mmol/L    Chloride 106 98 - 111 mmol/L    CO2 20 (L) 22 - 32 mmol/L    Glucose, Bld 95 70 - 99 mg/dL      Comment: Glucose reference range applies only to samples taken after fasting for at least 8 hours.    BUN 10 6 - 20 mg/dL    Creatinine, Ser 0.81 0.44 - 1.00 mg/dL    Calcium 9.0 8.9 - 10.3 mg/dL    GFR, Estimated >60 >60 mL/min      Comment: (NOTE) Calculated using the CKD-EPI Creatinine Equation (2021)      Anion gap 10 5 - 15      Comment: Performed at [facility] [address] [place], [place] [address]      Imaging Results (Last 48 hours)  No results found.           Medical Problem List and Plan: 1. Left side weakness secondary to acute ischemic right MCA infarction with emergent stenting angioplasty right ICA per interventional radiology             -patient may shower             -ELOS/Goals: 12-15 days, supervision with PT, OT and sup/mod I with SLP. Pt may exceed these projected dates/goals. 2.  Antithrombotics: -DVT/anticoagulation: Lovenox             -antiplatelet therapy: Aspirin 81 mg daily and Brilinta 90 mg twice daily 3. Pain Management: Tylenol as needed 4.  Mood: Provide emotional support             -antipsychotic agents: N/A 5. Neuropsych: This patient is capable of making decisions on her own behalf. 6. Skin/Wound Care: Routine skin checks 7. Fluids/Electrolytes/Nutrition: Routine in and outs with follow-up chemistries 8. Remote Covid infection 09/2020. SARS corona is negative on admission. 9. Remote tobacco abuse. Urine drug screen positive marijuana. 10.Obesity.BMI 30.90.Dietery follow up         Cathlyn Parsons, PA-C 11/18/2020  I have personally performed a face to face diagnostic evaluation of this patient  and formulated the key components of the plan.  Additionally, I have personally reviewed laboratory data, imaging studies, as well as relevant notes and concur with the physician assistant's documentation above.  The patient's status has not changed from the original H&P.  Any changes in documentation from the acute care chart have been noted above.  Meredith Staggers, MD, [facility]

## 2020-11-18 NOTE — IPOC Note (Signed)
Individualized overall Plan of Care [facility]) Patient Details Name: Dana Henry MRN: [id number] DOB: [birth year]  Admitting Diagnosis: Right middle cerebral artery stroke [facility])  Hospital Problems: Principal Problem:   Right middle cerebral artery stroke ([employer]) Active Problems:   Slow transit constipation   Hypoalbuminemia due to protein-calorie malnutrition ([place])   Marijuana abuse   Left hemiparesis ([place])     Functional Problem List: Nursing Medication Management,Motor,Safety,Endurance  PT Balance,Endurance,Motor  OT Balance,Cognition,Endurance,Safety,Motor  SLP    TR         Basic ADL's: OT Grooming,Bathing,Dressing,Toileting     Advanced  ADL's: OT Simple Meal Preparation,Light Housekeeping,Laundry     Transfers: PT Bed to [employer]  OT Toilet,Tub/Shower     Locomotion: PT Ambulation,Stairs     Additional Impairments: OT Fuctional Use of Upper Extremity  SLP Social Cognition   Awareness,Attention  TR      Anticipated Outcomes Item Anticipated Outcome  Self Feeding    Swallowing      Basic self-care  mod I  Toileting  mod I   Bathroom Transfers mod I  Bowel/Bladder  Remain continent of bowel/bladder while in rehab  Transfers  mod I  Locomotion  mod I  Communication     Cognition  Mod I  Pain  no complain of pain  Safety/Judgment  able to call for help and express needs   Therapy Plan: PT Intensity: Minimum of 1-2 x/day ,45 to 90 minutes PT Frequency: 5 out of 7 days PT Duration Estimated Length of Stay: 7-10 days OT Intensity: Minimum of 1-2 x/day, 45 to 90 minutes OT Frequency: 5 out of 7 days OT Duration/Estimated Length of Stay: 7-10 days SLP Intensity: Minumum of 1-2 x/day, 30 to 90 minutes SLP Frequency: 3 to 5 out of 7 days SLP Duration/Estimated Length of Stay: 7-10    Team Interventions: Nursing Interventions Patient/Family [employer] Management/Prevention,Discharge Planning  PT  interventions Ambulation/gait training,Cognitive remediation/compensation,Balance/vestibular training,Community reintegration,Discharge planning,DME/adaptive equipment instruction,Disease management/prevention,Neuromuscular re-education,Functional mobility training,Functional electrical stimulation,Pain management,Patient/family education,Psychosocial support,Skin care/wound management,Splinting/orthotics,Stair training,Therapeutic Activities,Therapeutic Exercise,UE/LE Strength taining/ROM,UE/LE Coordination activities,Visual/perceptual remediation/compensation,Wheelchair propulsion/positioning  OT Interventions Balance/vestibular training,Discharge planning,Functional electrical stimulation,Self Care/advanced ADL retraining,Therapeutic Activities,UE/LE Coordination activities,Cognitive remediation/compensation,Disease mangement/prevention,Functional mobility training,Patient/family education,Therapeutic Exercise,DME/adaptive equipment instruction,Neuromuscular re-education,Psychosocial support,Splinting/orthotics,UE/LE Strength taining/ROM  SLP Interventions Cueing hierarchy,Functional tasks,Cognitive remediation/compensation  TR Interventions    SW/CM Interventions Discharge Planning,Psychosocial Support,Patient/Family Education,Disease Management/Prevention   Barriers to Discharge MD  Medical stability and Weight  Nursing      PT Insurance for SNF coverage,Home environment access/layout    OT      SLP      SW       Team Discharge Planning: Destination: PT-Home ,OT- Home , SLP-Home Projected Follow-up: PT-Outpatient PT, OT-  Outpatient OT, SLP-None Projected Equipment Needs: PT-To be determined, OT- To be determined, SLP-None recommended by SLP Equipment Details: PT-Pt owns no DME, OT-Pt does not currently own any equipment Patient/family involved in discharge planning: PT- Patient,  OT-Patient, SLP-Patient  MD ELOS: 7-10 days. Medical Rehab Prognosis:  Good Assessment: 40 year old  right-handed female history of migraine headaches, Covid infection 09/2020 and remote tobacco abuse. She presented on 11/13/2020 with acute onset of left-sided weakness. She was noted to have a low-grade fever. Admission chemistries unremarkable except creatinine 1.06, WBC 8000, hemoglobin 14, SARS coronavirus negative, urine drug screen positive marijuana. Cranial CT scan showed no acute intracranial hemorrhage. Acute infarct involving the right caudate, adjacent white matter and superior lentiform nucleus. Patient did not receive TPA. CT angiogram showed occlusion of the right petrous ICA  within the carotid canal with reconstitution. Remainder of proximal intracranial vessels were patent. No occlusion or hemodynamically significant stenosis in the neck. Patient underwent cerebral arteriogram with emergent stent assisted angioplasty of the right ICA per interventional radiology. Echocardiogram with ejection fraction of 60 to 65%. No wall motion abnormalities. 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 No associated hemorrhage or mass-effect. Patient currently maintained on aspirin as well as Brilinta for CVA prophylaxis. Initially on Cleviprex for blood pressure control. TEE 11/18/2020 showed no hemodynamically significant valvular heart disease.  No LA appendage or thrombus.  No LV apical thrombus.. Due to patient decreased functional ability left hemiparesis she was admitted for a comprehensive rehab program. Will set goals for Mod I with PT/OT.   Due to the current state of emergency, patients may not be receiving their 3-hours of Medicare-mandated therapy.  See Team Conference Notes for weekly updates to the plan of care

## 2020-11-18 NOTE — PMR Pre-admission (Signed)
PMR Admission Coordinator Pre-Admission Assessment  Patient: Dana Henry is an 40 y.o., female MRN: [id number] DOB: Feb 23, 1981 Height:   Weight:                Insurance Information HMO:     PPO: yes     PCP:      IPA:      80/20:      OTHER:  PRIMARY: [employer] [place]      Policy#: [id number]      Subscriber: pt CM Name: [name]      Phone#: [phone]     Fax#: [phone] Pre-Cert#: [id number] approved until 2/1 when updates are due     Employer: [employer] Benefits:  Phone #: [phone]     Name: 1/28 Eff. Date: 10/19/2017     Deduct: $1500      Out of Pocket Max: $3000      Life Max: none  CIR: 80  %    SNF: 80% Outpatient: 80%     Co-Pay: 20% per medical neccesity Home Health: 80%      Co-Pay: 120 visits DME: 80%     Co-Pay: 20% Providers: In network  SECONDARY: none      Policy#:       Phone#:   Development worker, community:       Phone#:   The Engineer, petroleum" for patients in Inpatient Rehabilitation Facilities with attached "Privacy Act [place] Records" was provided and verbally reviewed with: N/A  Emergency Contact Information Contact Information    Name Relation Home Work [place] Spouse [phone]  [place] Mother [phone]  [phone]     Current Medical History  Patient Admitting Diagnosis: CVA  History of Present Illness:   Dana Henry is a 40 year old right-handed female history of migraine headaches, Covid infection 09/2020 and remote tobacco abuse. Patient lives with spouse and 2 young daughters. Independent prior to admission. Two-level home bed and bath on main level. Presented 11/13/2020 with acute onset of left-sided weakness. She was noted to have a low-grade fever. Admission chemistries unremarkable except creatinine 1.06, WBC 8000, hemoglobin 14, SARS coronavirus negative, urine drug screen positive marijuana. Cranial CT scan showed no acute intracranial hemorrhage. Acute infarct  involving the right caudate, adjacent white matter and superior lentiform nucleus. Patient did not receive TPA. 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 Remainder of proximal intracranial vessels were patent. No occlusion or hemodynamically significant stenosis in the neck. Patient underwent cerebral arteriogram with emergent stent assisted angioplasty of the right ICA per interventional radiology. Echocardiogram with ejection fraction of 60 to 65%. No wall motion abnormalities. 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 No associated hemorrhage or mass-effect. Patient currently maintained on aspirin as well as Brilinta for CVA prophylaxis. Subcutaneous Lovenox for DVT prophylaxis. Initially on Cleviprex for blood pressure control. Plan for TEE 11/18/2020 showed no hemodynamically significant valvular heart disease.  No LA appendage or thrombus.  No LV apical thrombus..  Complete NIHSS TOTAL: 4 Glasgow Coma Scale Score: 15  Past Medical History  Past Medical History:  Diagnosis Date  . Anemia   . History of cesarean delivery affecting pregnancy 08/10/2017  . Kidney stones    surgically removed at 40YO and 40YO  . Migraines    associated with menstrual cycle  . UTI (urinary tract  infection)   . Vaginal Pap smear, abnormal     Family History  family history includes Alcohol abuse in her father; Hypertension in her mother.  Prior Rehab/Hospitalizations:  Has the patient had prior rehab or hospitalizations prior to admission? Yes  Has the patient had major surgery during 100 days prior to admission? Yes  Current Medications   Current Facility-Administered Medications:  .   stroke: mapping our early stages of recovery book, , Does not apply, Once, [name], MD .   acetaminophen (TYLENOL) tablet 650 mg, 650 mg, Oral, Q4H PRN, 650 mg at 11/15/20 0022 **OR** acetaminophen (TYLENOL) 160 MG/5ML solution 650 mg, 650 mg, Per Tube, Q4H PRN, 650 mg at 11/14/20 1755 **OR** acetaminophen (TYLENOL) suppository 650 mg, 650 mg, Rectal, Q4H PRN, [name], [name], MD .  aspirin chewable tablet 81 mg, 81 mg, Oral, Daily, 81 mg at 11/17/20 1004 **OR** aspirin chewable tablet 81 mg, 81 mg, Per Tube, Daily, [name], [name], MD .  atorvastatin (LIPITOR) tablet 40 mg, 40 mg, Oral, Daily, [name], MD, 40 mg at 11/17/20 1004 .  Chlorhexidine Gluconate Cloth 2 % PADS 6 each, 6 each, Topical, Daily, [name], [name], MD, 6 each at 11/17/20 1249 .  enoxaparin (LOVENOX) injection 40 mg, 40 mg, Subcutaneous, Q24H, [name], MD, 40 mg at 11/17/20 1245 .  fentaNYL (SUBLIMAZE) injection 12.5 mcg, 12.5 mcg, Intravenous, Once, [name], MD .  pantoprazole (PROTONIX) EC tablet 40 mg, 40 mg, Oral, Daily, [name], MD, 40 mg at 11/17/20 1004 .  polyvinyl alcohol (LIQUIFILM TEARS) 1.4 % ophthalmic solution 1 drop, 1 drop, Both Eyes, PRN, [name], MD .  senna-docusate (Senokot-S) tablet 1 tablet, 1 tablet, Oral, QHS PRN, [name], MD .  sodium chloride flush (NS) 0.9 % injection 3 mL, 3 mL, Intravenous, Once, [name], [name], DO .  ticagrelor (BRILINTA) tablet 90 mg, 90 mg, Oral, BID, 90 mg at 11/17/20 2123 **OR** ticagrelor (BRILINTA) tablet 90 mg, 90 mg, Per Tube, BID, [name], [name], MD .  zolpidem (AMBIEN) tablet 5 mg, 5 mg, Oral, QHS PRN, [name], MD, 5 mg at 11/17/20 0055 .  zolpidem (AMBIEN) tablet 5 mg, 5 mg, Oral, QHS, [name], MD, 5 mg at 11/17/20 2123  Patients Current Diet:  Diet Order            Diet Heart Room service appropriate? 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  Independent  Indoor Mobility: Did the patient need assistance with walking from room to room (with or without device)? Independent  Stairs: Did the patient need assistance with internal or external stairs (with or without device)? Independent  Functional Cognition: Did the patient need help planning regular tasks such as shopping or remembering to take medications? Independent  Home Assistive Devices / Equipment Home Assistive Devices/Equipment: None Home Equipment: None  Prior Device Use: Indicate devices/aids used by the patient prior to current illness, exacerbation  or injury? None of the above  Current Functional Level Cognition  Overall Cognitive Status: Impaired/Different from baseline Orientation Level: Oriented X4 General Comments: Slow processing with cues, needing VCs to sequence RW and feet with turns.    Extremity Assessment (includes Sensation/Coordination)  Upper Extremity Assessment: Defer to OT evaluation LUE Deficits / Details: grossly 2/5 some digit movement, did not attempt wrist (A line still in place), elbow 2-/5 LUE Sensation: decreased light touch LUE Coordination: decreased fine motor,decreased gross motor  Lower Extremity Assessment: LLE deficits/detail LLE Deficits / Details: knee extension 4+, ankle DF 5/5 LLE Sensation:  (? decreased as pt with difficulty keeping eyes closed so unsure if appropriate responses)    ADLs  Overall ADL's : Needs assistance/impaired Eating/Feeding: NPO Grooming: Minimal assistance,Sitting Grooming Details (indicate cue  type and reason): requires assist for BUE tasks, able to use RUE normally Upper Body Bathing: Moderate assistance,Sitting Lower Body Bathing: Maximal assistance Upper Body Dressing : Maximal assistance Lower Body Dressing: Total assistance Toilet Transfer: Moderate assistance,+2 for physical assistance,+2 for safety/equipment,Stand-pivot,BSC Toilet Transfer Details (indicate cue type and reason): 2 person HHA Toileting- Clothing Manipulation and Hygiene: Moderate assistance,+2 for physical assistance,+2 for safety/equipment,Sit to/from stand Functional mobility during ADLs: +2 for physical assistance,+2 for safety/equipment,Minimal assistance (2 person HHA) General ADL Comments: L inattention    Mobility  Overal bed mobility: Needs Assistance Bed Mobility: Supine to Sit,Sit to Supine Supine to sit: Min assist,HOB elevated Sit to supine: Min assist General bed mobility comments: Cued pt to "punch" L UE across body with AAROM provided. Cued pt to sldie legs off R EOB and grab R rail with R hand to transition onto R elbow > hand to ascend trunk, needing minA to initiate but once elbow was under her torso she completed transition to sit. MinA to manage L leg and arm back into bed safely.    Transfers  Overall transfer level: Needs assistance Equipment used: Rolling walker (2 wheeled) Transfers: Sit to/from Stand Sit to Stand: Min assist,From elevated surface General transfer comment: L UE on RW to start with R hand on bed to push up, minA for steadying and maintaining L hand contact with RW.    Ambulation / Gait / Stairs / Wheelchair Mobility  Ambulation/Gait Ambulation/Gait assistance: Herbalist (Feet): 60 Feet Assistive device: Rolling walker (2 wheeled) Gait Pattern/deviations: Step-through pattern,Decreased stride length,Decreased step length - left,Decreased dorsiflexion - left General Gait Details: Pt tends to look to R. Needed cues to turn either direction to safely  manage lines. Hand-over-hand intermittently and repositioning of L hand on RW, poor awareness when it would slip out of position. Decreased L step length needing tactile and verbal cues to minimally correct. Repeated cues for sequencing RW and feet movement with turning and to remain within RW. 1 LOB posteriorly, needing minA to recover. Gait velocity: decr Gait velocity interpretation: <1.31 ft/sec, indicative of household ambulator    Posture / Balance Balance Overall balance assessment: Needs assistance Sitting-balance support: Single extremity supported,Feet supported Sitting balance-Leahy Scale: Fair Standing balance support: Bilateral upper extremity supported Standing balance-Leahy Scale: Poor Standing balance comment: dependent on at least one UE support for balance    Special needs/care consideration CPAP sleep trial study while in hospital Mom, Cecille Rubin is visitor     Previous Home Environment  Living Arrangements: Spouse/significant other,Children  Lives With: Spouse (13 and 54 year old children) Available Help at Discharge: Family,Available 24 hours/day (Mom works for [employer]  and can work remote) Type  of Home: House Home Layout: Two level,Bed/bath upstairs,Able to live on main level with bedroom/bathroom (her bedroom upstairs; can stay on first floor) Alternate Level Stairs-Rails: Right Alternate Level Stairs-Number of Steps: 13 Home Access: Level entry Bathroom Shower/Tub: Multimedia programmer: Standard Bathroom Accessibility: Yes How Accessible: Accessible via walker Home Care Services: No Additional Comments: Had just moved into new home pta  Discharge Living Setting Plans for Discharge Living Setting: Patient's home,Lives with (comment) (spouse  and 73 and 87 year old children) Type of Home at Discharge: House Discharge Home Layout: Two level,Bed/bath upstairs Alternate Level Stairs-Rails: Right Alternate Level Stairs-Number of Steps: 13 Discharge Home  Access: Level entry Discharge Bathroom Shower/Tub: Walk-in shower Discharge Bathroom Toilet: Standard Discharge Bathroom Accessibility: Yes How Accessible: Accessible via walker Does the patient have any problems obtaining your medications?: No  Social/Family/Support Systems Patient Roles: Engineer, 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 Information: spouse and Mom Anticipated Caregiver: Mom and spouse Anticipated Caregiver's Contact Information: [place] [phone] and Mom [phone] Ability/Limitations of Caregiver: Mom can work remote from home Caregiver Availability: 24/7 Discharge Plan Discussed with Primary Caregiver: Yes Is Caregiver In Agreement with Plan?: Yes Does Caregiver/Family have Issues with Lodging/Transportation while Pt is in Rehab?: No  Goals Patient/Family Goal for Rehab: supervision ot min with pT and OT, supervision with SLP Expected length of stay: [facility]OS 13 to 16 days Pt/Family Agrees to Admission and willing to participate: Yes Program Orientation Provided & Reviewed with Pt/Caregiver Including Roles  & Responsibilities: Yes  Decrease burden of Care through IP rehab admission: n/a  Possible need for SNF placement upon discharge:not anticipated  Patient Condition: This patient's medical and functional status has changed since the consult dated: 11/15/2020 in which the Rehabilitation Physician determined and documented that the patient's condition is appropriate for intensive rehabilitative care in an inpatient rehabilitation facility. See "History of Present Illness" (above) for medical update. Functional changes are: min assist overall. Patient's medical and functional status update has been discussed with the Rehabilitation physician and patient remains appropriate for inpatient rehabilitation. Will admit to inpatient rehab today.  Preadmission Screen Completed By:  Cleatrice Burke, RN, 11/18/2020 11:10  AM ______________________________________________________________________   Discussed status with Dr. Naaman Plummer on 11/18/2020 at  1121 and received approval for admission today.  Admission Coordinator:  Cleatrice Burke, time 9678 Date 11/18/2020

## 2020-11-18 NOTE — CV Procedure (Signed)
INDICATIONS: stroke  PROCEDURE:   Informed consent was obtained prior to the procedure. The risks, benefits and alternatives for the procedure were discussed and the patient comprehended these risks.  Risks include, but are not limited to, cough, sore throat, vomiting, nausea, somnolence, esophageal and stomach trauma or perforation, bleeding, low blood pressure, aspiration, pneumonia, infection, trauma to the teeth and death.    After a procedural time-out, the oropharynx was anesthetized with 20% benzocaine spray.   During this procedure the patient was administered propofol per anesthesia.  The patient's heart rate, blood pressure, and oxygen saturation were monitored continuously during the procedure. The period of conscious sedation was 30 minutes, of which I was present face-to-face 100% of this time.  The transesophageal probe was inserted in the esophagus and stomach without difficulty and multiple views were obtained.  The patient was kept under observation until the patient left the procedure room.  The patient left the procedure room in stable condition.   Agitated microbubble saline contrast was administered.  COMPLICATIONS:    There were no immediate complications.  FINDINGS:   FORMAL ECHOCARDIOGRAM REPORT PENDING Normal biventricular function. No hemodynamically significant valvular heart disease. No LA appendage thrombus, no LV apical thrombus. Redundant atrial septum with possible color flow. PFO was unable to be demonstrated on this study with agitated saline.    RECOMMENDATIONS:     return to hospital room when alert.  Time Spent Directly with the Patient:  45 minutes   Elouise Munroe 11/18/2020, 9:35 AM

## 2020-11-18 NOTE — Anesthesia Postprocedure Evaluation (Signed)
Anesthesia Post Note  Patient: Dana Henry  Procedure(s) Performed: TRANSESOPHAGEAL ECHOCARDIOGRAM (TEE) (N/A ) BUBBLE STUDY     Patient location during evaluation: PACU Anesthesia Type: MAC Level of consciousness: awake and alert Pain management: pain level controlled Vital Signs Assessment: post-procedure vital signs reviewed and stable Respiratory status: spontaneous breathing, nonlabored ventilation and respiratory function stable Cardiovascular status: stable and blood pressure returned to baseline Anesthetic complications: no   No complications documented.  Last Vitals:  Vitals:   11/18/20 0929 11/18/20 0939  BP: 95/64 (!) 101/56  Pulse: 93 82  Resp: (!) 21 10  Temp: 36.5 C   SpO2: 93% 97%    Last Pain:  Vitals:   11/18/20 0939  TempSrc:   PainSc: 0-No pain                 Audry Pili

## 2020-11-18 NOTE — Progress Notes (Addendum)
STROKE TEAM PROGRESS NOTE   INTERVAL HISTORY  Patient evaluated at bedside this morning, mother present in the room.  Patient denies any new complaints and states she feels better this morning.  TEE completed this morning and shows redundant atrial septum with possible color flow, PFO was unable to be demonstrated on this study with agitated saline.  Results discussed with patient and mother and they shows fair understanding about it.  For closure of PFO it would require discontinuation of antiplatelet therapy and we do not plan to do it for at least 6 months.  Patient is informed about availability of room in CIR and patient feel excited to be moved there.  Patient is instructed again to continue DAPT for 3 months and then aspirin alone.  She is also instructed not to smoke marijuana, tobacco or use any other drugs.  Blood pressure well controlled.  Neurologically patient is improving.  Vitals:   11/18/20 0812 11/18/20 0929 11/18/20 0939 11/18/20 1108  BP: (!) 105/46 95/64 (!) 101/56 (!) 110/47  Pulse: 88 93 82 71  Resp: 16 (!) 21 10 16   Temp: 98.6 F (37 C) 97.7 F (36.5 C)  98.1 F (36.7 C)  TempSrc: Oral Oral  Oral  SpO2: 98% 93% 97% 94%   CBC:  Recent Labs  Lab 11/13/20 1023 11/13/20 1027 11/17/20 0112 11/18/20 0154  WBC 8.0   < > 6.2 6.5  NEUTROABS 5.9  --   --   --   HGB 14.0   < > 11.4* 11.3*  HCT 41.9   < > 31.6* 31.7*  MCV 90.7   < > 87.1 87.3  PLT 228   < > 222 216   < > = values in this interval not displayed.   Basic Metabolic Panel:  Recent Labs  Lab 11/17/20 0112 11/18/20 0154  NA 137 136  K 3.4* 3.3*  CL 106 106  CO2 20* 20*  GLUCOSE 101* 95  BUN 9 10  CREATININE 0.82 0.81  CALCIUM 8.7* 9.0   Lipid Panel:  Recent Labs  Lab 11/14/20 0549  CHOL 156  TRIG 111  HDL 38*  CHOLHDL 4.1  VLDL 22  LDLCALC 96   HgbA1c:  Recent Labs  Lab 11/14/20 0549  HGBA1C 5.1   Urine Drug Screen: Positive for THC  Hypercoagulable panel: Negative; ANA, ANCA  pending  IMAGING past 24 hours  CT Head code stroke wo contrast 11/13/2020  IMPRESSION: No acute intracranial hemorrhage. Acute infarction involving the right caudate, adjacent white matter, and superior lentiform nucleus. ASPECTS is 7.  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 Remainder of proximal intracranial vessels are patent.   Perfusion imaging demonstrates no evidence of core infarction despite findings on noncontrast head CT. Calculated territory at risk of 242 mL throughout the right MCA territory as well as MCA/ACA and MCA/PCA watershed territories.   No occlusion or hemodynamically significant stenosis in the neck.  CT head wo contrast 11/13/2020  IMPRESSION: 1. Unchanged acute right basal ganglia infarct. 2. No evidence of hemorrhage or other new intracranial abnormality.  Chest x-ray 11/13/2020  IMPRESSION: No active disease.  MR Brain  11/14/2020  1. 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 No associated hemorrhage or significant mass effect. 2. Otherwise normal brain MRI for age.  MRA  Head  11/14/2020  MRA HEAD IMPRESSION:   1. Sequelae of recent stent placement within the petrous right ICA. Evaluation for implant stenosis limited by susceptibility artifact, however, widely patent flow seen distally within the right carotid siphon. 2. No other hemodynamically significant stenosis or other vascular abnormality within the intracranial circulation. 3. Fetal type origin of both PCAs with overall diminutive vertebrobasilar system.  VAS [place] lower Extremity 11/14/2020  Summary:  BILATERAL:  - No evidence of deep vein thrombosis seen in the lower extremities,  bilaterally.  -No evidence of popliteal cyst,  bilaterally.   Echocardiogram 11/14/2020  1. Left ventricular ejection fraction, by estimation, is 60 to 65%. The  left ventricle has normal function. The left ventricle has no regional  wall motion abnormalities. Left ventricular diastolic parameters were  normal.   2. Right ventricular systolic function is normal. The right ventricular  size is normal. There is normal pulmonary artery systolic pressure. The  estimated right ventricular systolic pressure is 11.9 mmHg.   3. The mitral valve is normal in structure. Trivial mitral valve  regurgitation. No evidence of mitral stenosis.   4. The aortic valve is tricuspid. Aortic valve regurgitation is not  visualized. No aortic stenosis is present.   5. The inferior vena cava is normal in size with greater than 50%  respiratory variability, suggesting right atrial pressure of 3 mmHg.    PHYSICAL EXAM  General: Well developed, obese young female lying comfortably in bed, NAD HEENT: [place]/AT, MMM, PERRLA Cardiovascular: Regular rate and rhythm  Respiratory: No respiratory distress Abdominal: Soft, nontender, no rigidity Psych: Normal mood and affect Neurological:  Mental Status: Alert, oriented, thought content appropriate.  Speech fluent without evidence of aphasia.  Able to follow pull commands without difficulty. Cranial Nerves: II:  Visual fields grossly normal, pupils equal, round, reactive to light and accommodation III,IV, VI: ptosis not present, extra-ocular motions intact bilaterally, right gaze preference resolved V,VII: smile asymmetric, left facial droop,facial light touch sensation decreased on left around 70% as compared to 100% on right VIII: hearing normal bilaterally IX,X: uvula rises symmetrically XI: bilateral shoulder shrug XII: midline tongue extension without atrophy or fasciculations  Motor: Right : Upper extremity   5/5    Left:     Upper extremity  Drift present  proximal 2/5, bicep 3 / 5, tricep 3/5,                                                                                                   finger grip 2/5  Lower extremity   5/5     Lower extremity   5/5 Tone and bulk:normal tone throughout; no atrophy noted Sensory: Pinprick and light touch decreased by 30% on left side. Deep Tendon Reflexes:  Right: Upper Extremity   Left: Upper extremity   biceps (C-5 to C-6) 2/4   biceps (C-5 to C-6) 2/4 tricep (C7) 2/4    triceps (C7) 2/4 Brachioradialis (C6) 2/4  Brachioradialis (C6) 2/4  Lower Extremity Lower Extremity  quadriceps (L-2 to L-4) 2/4   quadriceps (L-2 to L-4) 2/4 Achilles (S1) 2/4  Achilles (S1) 2/4  Plantars: Right: downgoing   Left: downgoing Cerebellar: normal finger-to-nose  Gait: Deferred  NIHSS 2 Premorbid MRs 0  ASSESSMENT/PLAN [name] is a 40 y.o. female with PMH X of migraine and COVID-19 infection in 09/2020 presented to ED for left arm and left leg numbness.  Left arm> weaker than left leg since Sunday and worsening left facial droop and right gaze preference.  CT head showed right caudate and basal ganglia subacute infarct no tPA administered, due to outside of window.  CT head and neck shows right ICA petrous segment occlusion versus high-grade stenosis.  CTP showed large penumbra on right and patient underwent successful thrombectomy.  MRI this morning shows patchy multifocal acute ischemic right MCA territory infarct involving the subcortical and deep white matter of right frontal and parietal lobes, largely watershed in distribution.    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  Code Stroke CT head : No acute intracranial hemorrhage. Acute infarction involving the right caudate, adjacent white matter, and superior lentiform nucleus. 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  CT perfusion: no core, large right  penumbra  MRI : Patchy multifocal acute ischemic right MCA territory infarcts involving the subcortical and deep white matter of the right frontal and parietal lobes, largely watershed in distribution.  MRA : Sequelae of recent stent placement within the petrous right ICA. widely patent flow seen distally within the right carotid siphon.  2D Echo: EF 60 to 65%.  Doppler Lower extremities [place]: No evidence of deep vein thrombosis   TCD bubble study: Positive PFO with spencer degree 2 on both rest and valsalva  TEE: Redundant atrial septum with possible color flow. PFO was unable to be demonstrated on this study with agitated saline.    LDL 96  HgbA1c 5.1  UDS + THC  Hypercoagulable panel negative   Autoimmune labs pending (see below)  VTE prophylaxis - Lovenox 40 mg  No antithrombotic prior to admission, now on aspirin 81 mg daily and Brilinta (ticagrelor) 90 mg bid.   Therapy recommendations: CIR  Disposition: Today in CIR  PFO  Positive TCD bubble study with spencer degree 2 at rest and with valsalva  TEE: Redundant atrial septum with possible color flow. PFO was unable to be demonstrated on this study with agitated saline.    May consider PFO closure , not for at least next 6 months as it would require discontinuation of antiplatelet therapy.  Hypertension  Home meds: None  BP fluctuate . Long-term BP goal normotensive  Hyperlipidemia  Home meds: None  LDL 96, goal < 70  On lipitor 40  Continue statin at discharge  Anemia   Hb 14.0->10.5->11.3  Could be due to acute blood loss with procedure  CBC monitoring  Other Stroke Risk Factors  Obesity: Patient will be advised to exercise and lose weight.  Past COVID-19 infection on 09/2020  Migraine   Additional Labs  Cardiolipin antibodies - < 9 - normal  Prothrombin Gene Mutation - pending   Lupus Anticoagulant - 32.8 - normal  Beta - 2 Glyco 1 IgG - < 9 - normal   Homocysteine - 12.8 -  normal  DRVVT - 36 - normal  ANCA Proteinase 3 - < 3.5 - normal  ANCA titers - pending  ANA, IFA with reflex - pending   CRP -  0.6 - normal  Sed rate - 14 - normal  RPR - non reactive - normal    Hospital day # 5  ATTENDING NOTE: I have personally obtained history,examined this patient, reviewed notes, independently viewed imaging studies, participated in medical decision making and plan of care.ROS completed by me personally and pertinent positives fully documented  I have made any additions or clarifications directly to the above note. Agree with note above. Patient continues to improve neurologically but still has very mild left face and upper extremity predominantly distal hand weakness.  TEE suggest some color flow across the interatrial septum but no bubbles noted on agitated saline but I suspect Valsalva maneuver could not be performed.  Recommend aspirin and Brilinta for 6 months and may consider PFO closure later.  Continue aggressive risk factor modification.  Follow results of vasculitic panel and hypercoagulable labs.  Patient appears to be at risk for sleep apnea and may consider possible participation in the sleep smart study if interested.  She was given information to review and decide.  Long discussion with patient and her mother at the bedside and answered questions.  Likely discharge to rehab today if bed available Antony Contras, MD Medical Director [place] Pager: [phone] 11/18/2020 3:53 PM    To contact Stroke Continuity provider, please refer to [email or web address] After hours, contact General Neurology

## 2020-11-18 NOTE — Progress Notes (Signed)
Echocardiogram Echocardiogram Transesophageal has been performed.  Dana Henry 11/18/2020, 9:46 AM

## 2020-11-18 NOTE — Progress Notes (Signed)
Inpatient Rehabilitation Medication Review by a Pharmacist  A complete drug regimen review was completed for this patient to identify any potential clinically significant medication issues.  Clinically significant medication issues were identified:  no  Check AMION for pharmacist assigned to patient if future medication questions/issues arise during this admission.  Time spent performing this drug regimen review (minutes):  [place], [place], [place] 11/18/2020 5:00 PM

## 2020-11-18 NOTE — Progress Notes (Signed)
Jamse Arn, MD  Physician  Physical Medicine and Rehabilitation  Consult Note     Addendum  Date of Service:  11/15/2020  5:11 AM      Related encounter: ED to Hosp-Admission (Current) from 11/13/2020 in [facility]       Expand All Collapse All     Show:Clear all [x] Manual[x] Template[] Copied  Added by: [x] Angiulli, Lavon Paganini, PA-C[x] Jamse Arn, MD   [] Hover for details           Physical Medicine and Rehabilitation Consult Reason for Consult: Left side weakness with visual deficits Referring Physician: Dr.Xu   HPI: Dana Henry is a 40 y.o. right-handed female with history of migraine headaches, Covid infection 09/2020 remote tobacco abuse.  History taken from chart review of patient.  Patient lives with spouse and 2 young daughters.  Independent prior to admission.  Two-level home bed and bath on main level.  She presented on 11/13/2020 with acute left hemiparesis.  She was noted to have a fever of 102.  Cranial CT scan showed no acute intracranial hemorrhage.  Acute infarct involving the right caudate, adjacent white matter and superior lentiform nucleus.  Patient did not receive TPA.  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  Remainder of proximal intracranial vessels were patent.  No occlusion or hemodynamically significant stenosis in the neck.  Patient underwent cerebral arteriogram with emergent stent assisted angioplasty of right ICA per interventional radiology.  Echocardiogram with ejection fraction of 60-65%.  No wall motion abnormalities..  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  No associated hemorrhage or mass-effect.  Patient is currently maintained on aspirin as well as Brilinta for CVA prophylaxis.  Subcutaneous  Lovenox for DVT prophylaxis.  Cleviprex for blood pressure control.  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  Therapy evaluations completed with recommendations of physical medicine rehab consult due to left-sided hemiparesis.   Review of Systems  Constitutional: Positive for malaise/fatigue. Negative for chills and fever.  HENT: Negative for hearing loss.   Eyes: Negative for blurred vision and double vision.  Respiratory: Negative for cough.   Cardiovascular: Negative for chest pain, palpitations and leg swelling.  Gastrointestinal: Positive for constipation. Negative for heartburn, nausea and vomiting.  Genitourinary: Negative for dysuria, flank pain and hematuria.  Musculoskeletal: Positive for myalgias.  Skin: Negative for rash.  Neurological: Positive for weakness and headaches.  All other systems reviewed and are negative.       Past Medical History:  Diagnosis Date  . Anemia    . History of cesarean delivery affecting pregnancy 08/10/2017  . Kidney stones      surgically removed at 40YO and 40YO  . Migraines      associated with menstrual cycle  . UTI (urinary tract infection)    . Vaginal Pap smear, abnormal           Past Surgical History:  Procedure Laterality Date  . CESAREAN SECTION N/A 07/21/2014    Procedure: CESAREAN SECTION;  Surgeon: Elveria Royals, MD;  Location: [facility];  Service: Obstetrics;  Laterality: N/A;  . CESAREAN SECTION N/A 08/10/2017    Procedure: Repeat CESAREAN SECTION;  Surgeon: Azucena Fallen, MD;  Location: [facility];  Service: Obstetrics;  Laterality: N/A;  EDD: 08/17/17 Allergy: Sulfa  .  KIDNEY STONE SURGERY      . RADIOLOGY WITH ANESTHESIA N/A 11/13/2020    Procedure: IR WITH ANESTHESIA;  Surgeon: Radiologist, Medication, MD;  Location: [facility];  Service: Radiology;  Laterality: N/A;  . TONSILLECTOMY AND ADENOIDECTOMY   1991         Family History  Problem Relation Age of  Onset  . Hypertension Mother    . Alcohol abuse Father      Social History:  reports that she quit smoking about 7 years ago. Her smoking use included cigarettes. She smoked 0.50 packs per day. She has never used smokeless tobacco. She reports current alcohol use. She reports that she does not use drugs. Allergies:      Allergies  Allergen Reactions  . Sulfa Antibiotics Hives and Swelling          Medications Prior to Admission  Medication Sig Dispense Refill  . ibuprofen (ADVIL) 200 MG tablet Take 400 mg by mouth every 6 (six) hours as needed for mild pain or headache.      . Multiple Vitamins-Minerals (MULTIVITAMIN WITH MINERALS) tablet Take 1 tablet by mouth daily.          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; supply chain Functional Status:  Mobility: Bed Mobility Overal bed mobility: Needs Assistance Bed Mobility: Supine to Sit,Sit to Supine Supine to sit: Min assist,+2 for physical assistance,+2 for safety/equipment,HOB elevated Sit to supine: Min assist,+2 for physical assistance,+2 for safety/equipment General bed mobility comments: assist for sequencing, management of LUE and LLE (back into bed) and line management Transfers Overall transfer level: Needs assistance Equipment used: 2 person hand held assist Transfers: Sit to/from Stand Sit to Stand: Min assist,From elevated surface,+2  safety/equipment,+2 physical assistance General transfer comment: LUE supported by therapist, min A for boost and balance, once from EOB, and once from recliner Ambulation/Gait Ambulation/Gait assistance: Min assist,+2 physical assistance,+2 safety/equipment Gait Distance (Feet): 17 Feet (5 ft) Assistive device: 2 person hand held assist Gait Pattern/deviations: Step-through pattern,Decreased stride length General Gait Details: pt being lead to turn left, yet her gaze remained to her right (head and eyes to rt) but able to turn and look for chair on her left with cues Gait velocity: decr   ADL: ADL Overall ADL's : Needs assistance/impaired Eating/Feeding: NPO Grooming: Minimal assistance,Sitting Grooming Details (indicate cue type and reason): requires assist for BUE tasks, able to use RUE normally Upper Body Bathing: Moderate assistance,Sitting Lower Body Bathing: Maximal assistance Upper Body Dressing : Maximal assistance Lower Body Dressing: Total assistance Toilet Transfer: Moderate assistance,+2 for physical assistance,+2 for safety/equipment,Stand-pivot,BSC Toilet Transfer Details (indicate cue type and reason): 2 person HHA Toileting- Clothing Manipulation and Hygiene: Moderate assistance,+2 for physical assistance,+2 for safety/equipment,Sit to/from stand Functional mobility during ADLs: +2 for physical assistance,+2 for safety/equipment,Minimal assistance (2 person HHA) General ADL Comments: L inattention   Cognition: Cognition Overall Cognitive Status: Within Functional Limits for tasks assessed Orientation Level: Oriented X4 Cognition Arousal/Alertness: Awake/alert Behavior During Therapy: WFL for tasks assessed/performed Overall Cognitive Status: Within Functional Limits for tasks assessed  Blood pressure 109/66, pulse 79, temperature 98.6 F (37 C), temperature source Oral, resp. rate 18, last menstrual period 11/05/2020, SpO2 98 %, currently breastfeeding. Physical  Exam Vitals reviewed.  Constitutional:      Appearance: She is obese.  HENT:     Head: Normocephalic and atraumatic.     Right Ear: External ear normal.     Left Ear: External ear normal.     Nose: Nose normal.  Eyes:     General:        Right eye: No discharge.        Left eye: No discharge.     Extraocular Movements: Extraocular movements intact.  Cardiovascular:     Rate and Rhythm: Normal rate and regular rhythm.  Pulmonary:     Effort: Pulmonary effort is normal. No respiratory distress.     Breath sounds: No stridor.  Abdominal:     General: Abdomen is flat. Bowel sounds are normal. There is no distension.  Musculoskeletal:     Cervical back: Normal range of motion and neck supple.     Comments: No edema or tenderness in extremities  Skin:    General: Skin is warm and dry.  Neurological:     Mental Status: She is alert.     Comments: Alert and oriented x3 She does display some right side neglect.  Follows simple commands. Motor: RUE/RLE: 4+-5/5 proximal distal LUE: 0/5 proximal distal LE: 4 -/5 proximal distal Patient weakness  Psychiatric:        Mood and Affect: Affect is blunt and flat.        Speech: Speech is delayed.        Lab Results Last 24 Hours       Results for orders placed or performed during the hospital encounter of 11/13/20 (from the past 24 hour(s))  Hemoglobin A1c     Status: None    Collection Time: 11/14/20  5:49 AM  Result Value Ref Range    Hgb A1c MFr Bld 5.1 4.8 - 5.6 %    Mean Plasma Glucose 99.67 mg/dL  Lipid panel     Status: Abnormal    Collection Time: 11/14/20  5:49 AM  Result Value Ref Range    Cholesterol 156 0 - 200 mg/dL    Triglycerides 111 <150 mg/dL    HDL 38 (L) >40 mg/dL    Total CHOL/HDL Ratio 4.1 RATIO    VLDL 22 0 - 40 mg/dL    LDL Cholesterol 96 0 - 99 mg/dL  Basic metabolic panel     Status: Abnormal    Collection Time: 11/14/20  5:49 AM  Result Value Ref Range    Sodium 138 135 - 145 mmol/L    Potassium  3.5 3.5 - 5.1 mmol/L    Chloride 108 98 - 111 mmol/L    CO2 18 (L) 22 - 32 mmol/L    Glucose, Bld 88 70 - 99 mg/dL    BUN 9 6 - 20 mg/dL    Creatinine, Ser 0.79 0.44 - 1.00 mg/dL    Calcium 8.4 (L) 8.9 - 10.3 mg/dL    GFR, Estimated >60 >60 mL/min    Anion gap 12 5 - 15  HIV Antibody (routine testing w rflx)     Status: None    Collection Time: 11/14/20  7:14 AM  Result Value Ref Range    HIV Screen 4th Generation wRfx Non Reactive Non Reactive       Imaging Results (Last 48 hours)  CT ANGIO HEAD W OR WO CONTRAST   Addendum Date: 11/13/2020   ADDENDUM REPORT: 11/13/2020 11:16 ADDENDUM: Transcription error in the impression. Should state occlusion or high-grade stenosis right petrous ICA. Electronically Signed   By: [name] M.D.   On: 11/13/2020 11:16    Result Date: 11/13/2020 CLINICAL DATA:  Code stroke follow-up, noncontrast CT positive for acute stroke EXAM: CT ANGIOGRAPHY HEAD AND NECK CT PERFUSION BRAIN TECHNIQUE: Multidetector CT imaging of the head and neck was performed using the standard protocol during bolus administration of intravenous contrast. Multiplanar CT image reconstructions and MIPs were obtained to evaluate the vascular anatomy. Carotid stenosis measurements (when applicable) are obtained utilizing NASCET criteria, using the distal internal carotid diameter as the denominator. Multiphase CT imaging of the brain was performed following IV bolus contrast injection. Subsequent parametric perfusion maps were calculated using RAPID software. CONTRAST:  139mL OMNIPAQUE IOHEXOL 350 MG/ML SOLN COMPARISON:  None. FINDINGS: CTA NECK FINDINGS Aortic arch: Great vessel origins are patent. 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 Right carotid system: Patent. No measurable stenosis at the ICA origin. Left carotid system: Patent. No measurable stenosis at the ICA origin Vertebral arteries: Patent. Left vertebral artery is dominant. No measurable  stenosis or evidence of dissection. Skeleton: No significant osseous abnormality Other neck: No mass or adenopathy. Upper chest: No apical lung mass. Review of the MIP images confirms the above findings CTA HEAD FINDINGS Anterior circulation: There is loss of right ICA enhancement within the carotid canal with reconstitution distally. Cavernous and supraclinoid portions are patent. Intracranial left ICA is patent. Bilateral anterior and middle cerebral arteries are patent. Posterior circulation: Intracranial vertebral arteries are patent and become diminutive after PICA origins. Basilar artery is patent and small in caliber. This is due to bilateral fetal origin of posterior cerebral arteries. Venous sinuses: As permitted by contrast timing, patent. 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 Remainder of proximal intracranial vessels are patent. Perfusion imaging demonstrates no evidence of core infarction despite findings on noncontrast head CT. Calculated territory at risk of 242 mL throughout the right MCA territory as well as MCA/ACA and MCA/PCA watershed territories. No occlusion or hemodynamically significant stenosis in the neck. Electronically Signed: By: [name] M.D. On: 11/13/2020 11:05    CT HEAD WO CONTRAST   Result Date: 11/13/2020 CLINICAL DATA:  Stroke follow-up. Status post emergent stent assisted angioplasty of right ICA petrous segment stenosis. EXAM: CT HEAD WITHOUT CONTRAST TECHNIQUE: Contiguous axial images were obtained from the base of the skull through the vertex without intravenous contrast. COMPARISON:  Head CT and CTA earlier today FINDINGS: Brain: Hypodensity is again seen in the right basal ganglia and adjacent deep white  matter corresponding to the previously described acute infarct. No significant infarct extension, new infarct, intracranial hemorrhage, mass, midline shift, or extra-axial fluid collection is identified. The ventricles and sulci are normal. Vascular: Interval right petrous ICA stenting. Skull: No fracture or suspicious osseous lesion. Sinuses/Orbits: Mild right maxillary sinus mucosal thickening. Clear mastoid air cells. Unremarkable orbits. Other: None. IMPRESSION: 1. Unchanged acute right basal ganglia infarct. 2. No evidence of hemorrhage or other new intracranial abnormality. Electronically Signed   By: Logan Bores M.D.   On: 11/13/2020 17:27    CT ANGIO  NECK W OR WO CONTRAST   Addendum Date: 11/13/2020   ADDENDUM REPORT: 11/13/2020 11:16 ADDENDUM: Transcription error in the impression. Should state occlusion or high-grade stenosis right petrous ICA. Electronically Signed   By: [name] M.D.   On: 11/13/2020 11:16    Result Date: 11/13/2020 CLINICAL DATA:  Code stroke follow-up, noncontrast CT positive for acute stroke EXAM: CT ANGIOGRAPHY HEAD AND NECK CT PERFUSION BRAIN TECHNIQUE: Multidetector CT imaging of the head and neck was performed using the standard protocol during bolus administration of intravenous contrast. Multiplanar CT image reconstructions and MIPs were obtained to evaluate the vascular anatomy. Carotid stenosis measurements (when applicable) are obtained utilizing NASCET criteria, using the distal internal carotid diameter as the denominator. Multiphase CT imaging of the brain was performed following IV bolus contrast injection. Subsequent parametric perfusion maps were calculated using RAPID software. CONTRAST:  178mL OMNIPAQUE IOHEXOL 350 MG/ML SOLN COMPARISON:  None. FINDINGS: CTA NECK FINDINGS Aortic arch: Great vessel origins are patent. 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 Right carotid system: Patent. No measurable stenosis at the  ICA origin. Left carotid system: Patent. No measurable stenosis at the ICA origin Vertebral arteries: Patent. Left vertebral artery is dominant. No measurable stenosis or evidence of dissection. Skeleton: No significant osseous abnormality Other neck: No mass or adenopathy. Upper chest: No apical lung mass. Review of the MIP images confirms the above findings CTA HEAD FINDINGS Anterior circulation: There is loss of right ICA enhancement within the carotid canal with reconstitution distally. Cavernous and supraclinoid portions are patent. Intracranial left ICA is patent. Bilateral anterior and middle cerebral arteries are patent. Posterior circulation: Intracranial vertebral arteries are patent and become diminutive after PICA origins. Basilar artery is patent and small in caliber. This is due to bilateral fetal origin of posterior cerebral arteries. Venous sinuses: As permitted by contrast timing, patent. 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 Remainder of proximal intracranial vessels are patent. Perfusion imaging demonstrates no evidence of core infarction despite findings on noncontrast head CT. Calculated territory at risk of 242 mL throughout the right MCA territory as well as MCA/ACA and MCA/PCA watershed territories. No occlusion or hemodynamically significant stenosis in the neck. Electronically Signed: By: [name] M.D. On: 11/13/2020 11:05    MR ANGIO HEAD WO CONTRAST   Result Date: 11/14/2020 CLINICAL DATA:  Follow-up examination for acute stroke. History of emergent stent assisted angioplasty of right ICA petrous stenosis. EXAM: MRI HEAD WITHOUT CONTRAST MRA HEAD WITHOUT CONTRAST TECHNIQUE: Multiplanar, multiecho pulse sequences of the  brain and surrounding structures were obtained without intravenous contrast. Angiographic images of the head were obtained using MRA technique without contrast. COMPARISON:  Prior CTs from 11/13/2020. FINDINGS: MRI HEAD FINDINGS Brain: Examination degraded by motion artifact. Cerebral volume within normal limits. No significant cerebral white matter disease. Patchy multifocal ischemic infarcts seen involving the subcortical and deep white matter of the right frontal and parietal lobes, largely watershed in distribution. Largest area of ischemia measures up to 3.1 cm at the right corona radiata just above the right basal ganglia. No associated hemorrhage or significant mass effect. No other evidence for acute or subacute ischemia. Gray-white matter differentiation otherwise maintained. No encephalomalacia to suggest chronic cortical infarction elsewhere within the brain. No evidence for  acute or chronic intracranial hemorrhage. No mass lesion, midline shift or mass effect. No hydrocephalus or extra-axial fluid collection. Pituitary gland suprasellar region normal. Midline structures intact. Vascular: Major intracranial vascular flow voids are maintained. Diminutive vertebrobasilar system noted. Skull and upper cervical spine: Craniocervical junction within normal limits. Upper cervical spine normal. Diffusely decreased T1 signal intensity seen within the visualized bone marrow, nonspecific, but most commonly related to anemia, smoking, or obesity. No focal marrow replacing lesion. No scalp soft tissue abnormality. Sinuses/Orbits: Globes and orbital soft tissues within normal limits. Mild scattered mucosal thickening noted within the paranasal sinuses. Right maxillary sinus retention cyst noted. Trace right mastoid effusion noted, of doubtful significance. Inner ear structures grossly normal. Other: None. MRA HEAD FINDINGS ANTERIOR CIRCULATION: Visualized distal cervical segments of the internal carotid arteries are  patent with antegrade flow. Flow related signal loss seen within the petrous right ICA consistent with recent stent placement. Evaluation for implant stenosis limited due to susceptibility artifact. Widely plate and flow is seen distally within the cavernous and supraclinoid right ICA. The contralateral petrous, cavernous, and supraclinoid left ICAs widely patent. A1 segments widely patent. Normal anterior communicating artery complex. Anterior cerebral arteries patent to their distal aspects without stenosis. No M1 stenosis or occlusion. Negative MCA bifurcations. Distal MCA branches well perfused and symmetric. POSTERIOR CIRCULATION: Left vertebral artery dominant and widely patent to the vertebrobasilar junction. Hypoplastic right vertebral artery largely terminates in PICA. Right PICA itself is patent. Left PICA origin patent and normal as well. Basilar diffusely diminutive but remains widely patent. Superior cerebellar arteries patent bilaterally. Predominant fetal type origin of both PCAs supplied via robust bilateral posterior communicating arteries. PCAs remain widely patent to their distal aspects. No intracranial aneurysm. IMPRESSION: MRI HEAD IMPRESSION: 1. 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 No associated hemorrhage or significant mass effect. 2. Otherwise normal brain MRI for age. MRA HEAD IMPRESSION: 1. Sequelae of recent stent placement within the petrous right ICA. Evaluation for implant stenosis limited by susceptibility artifact, however, widely patent flow seen distally within the right carotid siphon. 2. No other hemodynamically significant stenosis or other vascular abnormality within the intracranial circulation. 3. Fetal type origin of both PCAs with overall diminutive vertebrobasilar system. Electronically Signed   By: Jeannine Boga M.D.    On: 11/14/2020 04:59    MR BRAIN WO CONTRAST   Result Date: 11/14/2020 CLINICAL DATA:  Follow-up examination for acute stroke. History of emergent stent assisted angioplasty of right ICA petrous stenosis. EXAM: MRI HEAD WITHOUT CONTRAST MRA HEAD WITHOUT CONTRAST TECHNIQUE: Multiplanar, multiecho pulse sequences of the brain and surrounding structures were obtained without intravenous contrast. Angiographic images of the head were obtained using MRA technique without contrast. COMPARISON:  Prior CTs from 11/13/2020. FINDINGS: MRI HEAD FINDINGS Brain: Examination degraded by motion artifact. Cerebral volume within normal limits. No significant cerebral white matter disease. Patchy multifocal ischemic infarcts seen involving the subcortical and deep white matter of the right frontal and parietal lobes, largely watershed in distribution. Largest area of ischemia measures up to 3.1 cm at the right corona radiata just above the right basal ganglia. No associated hemorrhage or significant mass effect. No other evidence for acute or subacute ischemia. Gray-white matter differentiation otherwise maintained. No encephalomalacia to suggest chronic cortical infarction elsewhere within the brain. No evidence for acute or chronic intracranial hemorrhage. No mass lesion, midline shift or mass effect.  No hydrocephalus or extra-axial fluid collection. Pituitary gland suprasellar region normal. Midline structures intact. Vascular: Major intracranial vascular flow voids are maintained. Diminutive vertebrobasilar system noted. Skull and upper cervical spine: Craniocervical junction within normal limits. Upper cervical spine normal. Diffusely decreased T1 signal intensity seen within the visualized bone marrow, nonspecific, but most commonly related to anemia, smoking, or obesity. No focal marrow replacing lesion. No scalp soft tissue abnormality. Sinuses/Orbits: Globes and orbital soft tissues within normal limits. Mild scattered  mucosal thickening noted within the paranasal sinuses. Right maxillary sinus retention cyst noted. Trace right mastoid effusion noted, of doubtful significance. Inner ear structures grossly normal. Other: None. MRA HEAD FINDINGS ANTERIOR CIRCULATION: Visualized distal cervical segments of the internal carotid arteries are patent with antegrade flow. Flow related signal loss seen within the petrous right ICA consistent with recent stent placement. Evaluation for implant stenosis limited due to susceptibility artifact. Widely plate and flow is seen distally within the cavernous and supraclinoid right ICA. The contralateral petrous, cavernous, and supraclinoid left ICAs widely patent. A1 segments widely patent. Normal anterior communicating artery complex. Anterior cerebral arteries patent to their distal aspects without stenosis. No M1 stenosis or occlusion. Negative MCA bifurcations. Distal MCA branches well perfused and symmetric. POSTERIOR CIRCULATION: Left vertebral artery dominant and widely patent to the vertebrobasilar junction. Hypoplastic right vertebral artery largely terminates in PICA. Right PICA itself is patent. Left PICA origin patent and normal as well. Basilar diffusely diminutive but remains widely patent. Superior cerebellar arteries patent bilaterally. Predominant fetal type origin of both PCAs supplied via robust bilateral posterior communicating arteries. PCAs remain widely patent to their distal aspects. No intracranial aneurysm. IMPRESSION: MRI HEAD IMPRESSION: 1. 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 No associated hemorrhage or significant mass effect. 2. Otherwise normal brain MRI for age. MRA HEAD IMPRESSION: 1. Sequelae of recent stent placement within the petrous right ICA. Evaluation for implant stenosis limited by susceptibility artifact,  however, widely patent flow seen distally within the right carotid siphon. 2. No other hemodynamically significant stenosis or other vascular abnormality within the intracranial circulation. 3. Fetal type origin of both PCAs with overall diminutive vertebrobasilar system. Electronically Signed   By: Jeannine Boga M.D.   On: 11/14/2020 04:59    CT CEREBRAL PERFUSION W CONTRAST   Addendum Date: 11/13/2020   ADDENDUM REPORT: 11/13/2020 11:16 ADDENDUM: Transcription error in the impression. Should state occlusion or high-grade stenosis right petrous ICA. Electronically Signed   By: [name] M.D.   On: 11/13/2020 11:16    Result Date: 11/13/2020 CLINICAL DATA:  Code stroke follow-up, noncontrast CT positive for acute stroke EXAM: CT ANGIOGRAPHY HEAD AND NECK CT PERFUSION BRAIN TECHNIQUE: Multidetector CT imaging of the head and neck was performed using the standard protocol during bolus administration of intravenous contrast. Multiplanar CT image reconstructions and MIPs were obtained to evaluate the vascular anatomy. Carotid stenosis measurements (when applicable) are obtained utilizing NASCET criteria, using the distal internal carotid diameter as the denominator. Multiphase CT imaging of the brain was performed following IV bolus contrast injection. Subsequent parametric perfusion maps were calculated using RAPID software. CONTRAST:  175mL OMNIPAQUE IOHEXOL 350 MG/ML SOLN COMPARISON:  None. FINDINGS: CTA NECK FINDINGS Aortic arch: Great vessel origins are patent. 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 Right carotid system: Patent. No measurable stenosis at the ICA origin. Left carotid system:  Patent. No measurable stenosis at the ICA origin Vertebral arteries: Patent. Left vertebral artery is dominant. No measurable stenosis or evidence of dissection. Skeleton: No significant osseous abnormality Other neck: No mass or adenopathy. Upper chest: No apical lung  mass. Review of the MIP images confirms the above findings CTA HEAD FINDINGS Anterior circulation: There is loss of right ICA enhancement within the carotid canal with reconstitution distally. Cavernous and supraclinoid portions are patent. Intracranial left ICA is patent. Bilateral anterior and middle cerebral arteries are patent. Posterior circulation: Intracranial vertebral arteries are patent and become diminutive after PICA origins. Basilar artery is patent and small in caliber. This is due to bilateral fetal origin of posterior cerebral arteries. Venous sinuses: As permitted by contrast timing, patent. 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 Remainder of proximal intracranial vessels are patent. Perfusion imaging demonstrates no evidence of core infarction despite findings on noncontrast head CT. Calculated territory at risk of 242 mL throughout the right MCA territory as well as MCA/ACA and MCA/PCA watershed territories. No occlusion or hemodynamically significant stenosis in the neck. Electronically Signed: By: [name] M.D. On: 11/13/2020 11:05    DG Chest Port 1 View   Result Date: 11/13/2020 CLINICAL DATA:  40 year old female with cough. EXAM: PORTABLE CHEST 1 VIEW COMPARISON:  None. FINDINGS: No focal consolidation, pleural effusion or pneumothorax. The cardiac silhouette is within limits. No acute osseous pathology. IMPRESSION: No active disease. Electronically Signed   By: Anner Crete M.D.   On: 11/13/2020 18:47    ECHOCARDIOGRAM COMPLETE   Result Date: 11/14/2020    ECHOCARDIOGRAM REPORT   Patient Name:   Dana Henry Date of Exam: 11/14/2020 Medical Rec #:  [id number]      Height:       64.0 in Accession #:    [id number]      Weight:       180.0 lb Date of Birth:  July 04, 1981     BSA:          1.871 m Patient Age:    9 years       BP:           126/70 mmHg Patient Gender: F              HR:           78 bpm. Exam Location:  Inpatient Procedure: 2D Echo, 3D Echo, Cardiac Doppler and Color Doppler Indications:    Stroke  History:        Patient has no prior history of Echocardiogram examinations.                 Stroke. Covid infection 12/21.  Sonographer:    Roseanna Rainbow RDCS Referring Phys: [id number] [facility]  Sonographer Comments: Technically difficult study due to poor echo windows. IMPRESSIONS  1. Left ventricular ejection fraction, by estimation, is 60 to 65%. The left ventricle has normal function. The left ventricle has no regional wall motion abnormalities. Left ventricular diastolic parameters were normal.  2. Right ventricular systolic function is normal. The right ventricular size is normal. There is normal pulmonary artery systolic pressure. The estimated right ventricular systolic pressure is 0000000 mmHg.  3. The mitral valve is normal in structure. Trivial mitral valve regurgitation. No evidence of mitral stenosis.  4. The aortic valve is tricuspid. Aortic valve regurgitation is not visualized. No aortic stenosis is present.  5. The inferior vena cava is normal in size with greater than 50% respiratory variability, suggesting right atrial pressure of 3 mmHg. FINDINGS  Left Ventricle: Left ventricular ejection fraction, by estimation, is 60 to 65%. The left ventricle has normal function. The left ventricle has no regional wall motion abnormalities. The left ventricular internal cavity size was normal in size. There is  no left ventricular hypertrophy. Left ventricular diastolic parameters were normal. Right Ventricle: The right ventricular size is normal. No increase in right ventricular wall thickness. Right ventricular systolic function is normal. There is normal pulmonary artery systolic pressure. The tricuspid regurgitant  velocity is 1.84 m/s, and  with an assumed right atrial pressure of 3 mmHg, the estimated right ventricular systolic pressure is 37.1 mmHg. Left Atrium: Left atrial size was normal in size. Right Atrium: Right atrial size was normal in size. Pericardium: There is no evidence of pericardial effusion. Mitral Valve: The mitral valve is normal in structure. Trivial mitral valve regurgitation. No evidence of mitral valve stenosis. Tricuspid Valve: The tricuspid valve is normal in structure. Tricuspid valve regurgitation is trivial. Aortic Valve: The aortic valve is tricuspid. Aortic valve regurgitation is not visualized. No aortic stenosis is present. Pulmonic Valve: The pulmonic valve was normal in structure. Pulmonic valve regurgitation is trivial. Aorta: The aortic root is normal in size and structure. Venous: The inferior vena cava is normal in size with greater than 50% respiratory variability, suggesting right atrial pressure of 3 mmHg. IAS/Shunts: No atrial level shunt detected by color flow Doppler.  LEFT VENTRICLE PLAX 2D LVIDd:         4.10 cm     Diastology LVIDs:         2.30 cm     LV e' medial:    9.25 cm/s LV PW:         1.20 cm     LV E/e' medial:  9.6 LV IVS:        1.10 cm     LV e' lateral:   10.70 cm/s LVOT diam:     1.50 cm     LV E/e' lateral: 8.3 LV SV:         34 LV SV Index:   18 LVOT Area:     1.77 cm  LV Volumes (MOD) LV vol d, MOD A2C: 38.0 ml LV vol d, MOD A4C: 61.9 ml LV vol s, MOD A2C: 16.8 ml LV vol s, MOD A4C: 21.5 ml LV SV MOD A2C:     21.2 ml LV SV MOD A4C:     61.9 ml LV SV MOD BP:      28.4 ml RIGHT VENTRICLE             IVC RV S prime:     12.20 cm/s  IVC diam: 2.10 cm TAPSE (M-mode): 2.0 cm LEFT ATRIUM             Index       RIGHT ATRIUM           Index LA diam:        3.40 cm 1.82 cm/m  RA Area:     10.70 cm LA Vol (A2C):   24.2 ml 12.94 ml/m RA Volume:   19.30 ml  10.32 ml/m LA Vol (A4C):   23.6 ml 12.62 ml/m LA Biplane Vol: 24.1 ml 12.88 ml/m  AORTIC VALVE LVOT Vmax:  105.00 cm/s LVOT Vmean:  65.700 cm/s LVOT VTI:    0.191 m  AORTA Ao Root diam: 3.10 cm Ao Asc diam:  2.80 cm MITRAL VALVE               TRICUSPID VALVE MV Area (PHT): 5.20 cm    TR Peak grad:   13.5 mmHg MV Decel Time: 146 msec    TR Vmax:        184.00 cm/s MV E velocity: 89.20 cm/s MV A velocity: 72.70 cm/s  SHUNTS MV E/A ratio:  1.23        Systemic VTI:  0.19 m                            Systemic Diam: 1.50 cm Loralie Champagne MD Electronically signed by Loralie Champagne MD Signature Date/Time: 11/14/2020/6:43:52 PM    Final     CT HEAD CODE STROKE WO CONTRAST   Result Date: 11/13/2020 CLINICAL DATA:  Code stroke.  Right-sided gaze, facial droop EXAM: CT HEAD WITHOUT CONTRAST TECHNIQUE: Contiguous axial images were obtained from the base of the skull through the vertex without intravenous contrast. COMPARISON:  None. FINDINGS: Brain: There is no acute intracranial hemorrhage. Hypoattenuation is present involving the right caudate, adjacent white matter, and superior lentiform nucleus. No significant mass effect. No hydrocephalus. No extra-axial collection. Vascular: No hyperdense vessel. Skull: Unremarkable. Sinuses/Orbits: No acute abnormality Other: Mastoid air cells are clear. ASPECTS [facility] Stroke Program Early CT Score) - Ganglionic level infarction (caudate, lentiform nuclei, internal capsule, insula, M1-M3 cortex): 4 - Supraganglionic infarction (M4-M6 cortex): 3 Total score (0-10 with 10 being normal): 7 IMPRESSION: No acute intracranial hemorrhage. Acute infarction involving the right caudate, adjacent white matter, and superior lentiform nucleus. ASPECTS is 7. These results were called by telephone at the time of interpretation on 11/13/2020 at 10:34 am to provider Dr. Erlinda Hong, who verbally acknowledged these results. Electronically Signed   By: [name] M.D.   On: 11/13/2020 10:38    VAS [place] LOWER EXTREMITY VENOUS (DVT)   Result Date: 11/14/2020  Lower Venous DVT Study Indications: Stroke.  Risk  Factors: None identified. Anticoagulation: Lovenox. Comparison Study: No previous exam Performing Technologist: Vonzell Schlatter RVT  Examination Guidelines: A complete evaluation includes B-mode imaging, spectral Doppler, color Doppler, and power Doppler as needed of all accessible portions of each vessel. Bilateral testing is considered an integral part of a complete examination. Limited examinations for reoccurring indications may be performed as noted. The reflux portion of the exam is performed with the patient in reverse Trendelenburg.  +---------+---------------+---------+-----------+----------+--------------+ RIGHT    CompressibilityPhasicitySpontaneityPropertiesThrombus Aging +---------+---------------+---------+-----------+----------+--------------+ CFV      Full           Yes      Yes                                 +---------+---------------+---------+-----------+----------+--------------+ SFJ      Full                                                        +---------+---------------+---------+-----------+----------+--------------+ FV Prox  Full                                                        +---------+---------------+---------+-----------+----------+--------------+  FV Mid   Full                                                        +---------+---------------+---------+-----------+----------+--------------+ FV DistalFull                                                        +---------+---------------+---------+-----------+----------+--------------+ PFV      Full                                                        +---------+---------------+---------+-----------+----------+--------------+ POP      Full           Yes      Yes                                 +---------+---------------+---------+-----------+----------+--------------+ PTV      Full                                                         +---------+---------------+---------+-----------+----------+--------------+ PERO     Full                                                        +---------+---------------+---------+-----------+----------+--------------+   +---------+---------------+---------+-----------+----------+--------------+ LEFT     CompressibilityPhasicitySpontaneityPropertiesThrombus Aging +---------+---------------+---------+-----------+----------+--------------+ CFV      Full           Yes      Yes                                 +---------+---------------+---------+-----------+----------+--------------+ SFJ      Full                                                        +---------+---------------+---------+-----------+----------+--------------+ FV Prox  Full                                                        +---------+---------------+---------+-----------+----------+--------------+ FV Mid   Full                                                        +---------+---------------+---------+-----------+----------+--------------+  FV DistalFull                                                        +---------+---------------+---------+-----------+----------+--------------+ PFV      Full                                                        +---------+---------------+---------+-----------+----------+--------------+ POP      Full           Yes      Yes                                 +---------+---------------+---------+-----------+----------+--------------+ PTV      Full                                                        +---------+---------------+---------+-----------+----------+--------------+ PERO     Full                                                        +---------+---------------+---------+-----------+----------+--------------+  Summary: BILATERAL: - No evidence of deep vein thrombosis seen in the lower extremities, bilaterally. -No evidence of  popliteal cyst, bilaterally.   *See table(s) above for measurements and observations. Electronically signed by Servando Snare MD on 11/14/2020 at 1:58:02 PM.    Final        Assessment/Plan: Diagnosis: Acute infarct involving the right caudate, adjacent white matter and superior lentiform nucleus.   Stroke: Continue secondary stroke prophylaxis and Risk Factor Modification listed below:   Antiplatelet therapy:   Blood Pressure Management:  Continue current medication with prn's with permisive HTN per primary team Statin Agent:   Diabetes management:   Tobacco abuse:   Left sided hemiparesis: fit for orthosis to prevent contractures (resting hand splint for day, wrist cock up splint at night, PRAFO, etc) PT/OT for mobility, ADL training  Motor recovery: Fluoxetine Labs independently reviewed.  Records reviewed and summated above.multifocal acute ischemic right MCA territory infarcts      1. Does the need for close, 24 hr/day medical supervision in concert with the patient's rehab needs make it unreasonable for this patient to be served in a less intensive setting? Yes  2. Co-Morbidities requiring supervision/potential complications: migraine headaches, Covid infection 09/2020, remote tobacco abuse, post stroke dysphagia (advance diet as tolerated), ABLA (repeat labs, consider transfusion if necessary to ensure appropriate perfusion for increased activity tolerance) 3. Due to bladder management, safety, medication administration and patient education, does the patient require 24 hr/day rehab nursing? Yes 4. Does the patient require coordinated care of a physician, rehab nurse, therapy disciplines of PT/OT/SLP to address physical and functional deficits in the context of the above medical diagnosis(es)? Yes Addressing deficits in the following areas: balance, endurance, locomotion, strength, transferring, bathing,  dressing, toileting, cognition and psychosocial support 5. Can the patient actively  participate in an intensive therapy program of at least 3 hrs of therapy per day at least 5 days per week? Yes 6. The potential for patient to make measurable gains while on inpatient rehab is excellent 7. Anticipated functional outcomes upon discharge from inpatient rehab are supervision and min assist  with PT, supervision and min assist with OT, modified independent with SLP. 8. Estimated rehab length of stay to reach the above functional goals is: 13-16 days. 9. Anticipated discharge destination: Home 10. Overall Rehab/Functional Prognosis: good   RECOMMENDATIONS: This patient's condition is appropriate for continued rehabilitative care in the following setting: CIR Patient has agreed to participate in recommended program. Yes Note that insurance prior authorization may be required for reimbursement for recommended care.   Comment: Rehab Admissions Coordinator to follow up.   I have personally performed a face to face diagnostic evaluation, including, but not limited to relevant history and physical exam findings, of this patient and developed relevant assessment and plan.  Additionally, I have reviewed and concur with the physician assistant's documentation above.    Delice Lesch, MD, ABPMR Cathlyn Parsons, PA-C 11/15/2020      Revision History                             Routing History              Note Details  Author Jamse Arn, MD File Time 11/15/2020  2:01 PM  Author Type Physician Status Addendum  Last Editor Jamse Arn, MD Service Physical Medicine and Rehabilitation

## 2020-11-18 NOTE — Progress Notes (Signed)
Dana Gong, RN  Rehab Admission Coordinator  Physical Medicine and Rehabilitation  PMR Pre-admission     Signed  Date of Service:  11/18/2020 11:09 AM      Related encounter: ED to Hosp-Admission (Current) from 11/13/2020 in [facility]       Signed          Show:Clear all [x] Manual[x] Template[x] Copied  Added by: [x] Dana Gong, RN   [] Hover for details  PMR Admission Coordinator Pre-Admission Assessment   Patient: Dana Henry is an 40 y.o., female MRN: [id number] DOB: 11-26-1980 Height:   Weight:                                                                                                                                                    Insurance Information HMO:     PPO: yes     PCP:      IPA:      80/20:      OTHER:  PRIMARYLorella [place] of [place]      Policy#: [id number]      Subscriber: pt CM Name: Dana Henry      Phone#: [phone]     Fax#: [phone] Pre-Cert#: [id number] approved until 2/1 when updates are due     Employer: [employer] Benefits:  Phone #: [phone]     Name: 1/28 Eff. Date: 10/19/2017     Deduct: $1500      Out of Pocket Max: $3000      Life Max: none  CIR: 80  %    SNF: 80% Outpatient: 80%     Co-Pay: 20% per medical neccesity Home Health: 80%      Co-Pay: 120 visits DME: 80%     Co-Pay: 20% Providers: In network  SECONDARY: none      Policy#:       Phone#:    Development worker, community:       Phone#:    The Engineer, petroleum" for patients in Inpatient Rehabilitation Facilities with attached "Privacy Act [place] Records" was provided and verbally reviewed with: N/A   Emergency Contact Information         Contact Information     Name Relation Home Work Dana Henry Spouse [phone]   Dana Henry Mother [phone]   [phone]       Current Medical History  Patient Admitting Diagnosis: CVA   History of Present Illness:   Dana Henry is  a 40 year old right-handed female history of migraine headaches, Covid infection 09/2020 and remote tobacco abuse. Patient lives with spouse and 2 young daughters. Independent prior to admission. Two-level home bed and bath on main level. Presented 11/13/2020 with acute onset of left-sided weakness. She was noted to have a low-grade fever. Admission chemistries unremarkable except creatinine 1.06, WBC 8000, hemoglobin 14, SARS coronavirus  negative, urine drug screen positive marijuana. Cranial CT scan showed no acute intracranial hemorrhage. Acute infarct involving the right caudate, adjacent white matter and superior lentiform nucleus. Patient did not receive TPA. 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 Remainder of proximal intracranial vessels were patent. No occlusion or hemodynamically significant stenosis in the neck. Patient underwent cerebral arteriogram with emergent stent assisted angioplasty of the right ICA per interventional radiology. Echocardiogram with ejection fraction of 60 to 65%. No wall motion abnormalities. 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 No associated hemorrhage or mass-effect. Patient currently maintained on aspirin as well as Brilinta for CVA prophylaxis. Subcutaneous Lovenox for DVT prophylaxis. Initially on Cleviprex for blood pressure control. Plan for TEE 11/18/2020 showed no hemodynamically significant valvular heart disease.  No LA appendage or thrombus.  No LV apical thrombus..   Complete NIHSS TOTAL: 4 Glasgow Coma Scale Score: 15   Past Medical History      Past Medical History:  Diagnosis Date  . Anemia    . History of cesarean delivery affecting pregnancy 08/10/2017  . Kidney stones      surgically removed at 40YO and 40YO  . Migraines       associated with menstrual cycle  . UTI (urinary tract infection)    . Vaginal Pap smear, abnormal        Family History  family history includes Alcohol abuse in her father; Hypertension in her mother.   Prior Rehab/Hospitalizations:  Has the patient had prior rehab or hospitalizations prior to admission? Yes   Has the patient had major surgery during 100 days prior to admission? Yes   Current Medications    Current Facility-Administered Medications:  .   stroke: mapping our early stages of recovery book, , Does not apply, Once, [name], MD .  acetaminophen (TYLENOL) tablet 650 mg, 650 mg, Oral, Q4H PRN, 650 mg at 11/15/20 0022 **OR** acetaminophen (TYLENOL) 160 MG/5ML solution 650 mg, 650 mg, Per Tube, Q4H PRN, 650 mg at 11/14/20 1755 **OR** acetaminophen (TYLENOL) suppository 650 mg, 650 mg, Rectal, Q4H PRN, [name], [name], MD .  aspirin chewable tablet 81 mg, 81 mg, Oral, Daily, 81 mg at 11/17/20 1004 **OR** aspirin chewable tablet 81 mg, 81 mg, Per Tube, Daily, [name], [name], MD .  atorvastatin (LIPITOR) tablet 40 mg, 40 mg, Oral, Daily, [name], MD, 40 mg at 11/17/20 1004 .  Chlorhexidine Gluconate Cloth 2 % PADS 6 each, 6 each, Topical, Daily, [name], [name], MD, 6 each at 11/17/20 1249 .  enoxaparin (LOVENOX) injection 40 mg, 40 mg, Subcutaneous, Q24H, [name], MD, 40 mg at 11/17/20 1245 .  fentaNYL (SUBLIMAZE) injection 12.5 mcg, 12.5 mcg, Intravenous, Once, [name], MD .  pantoprazole (PROTONIX) EC tablet 40 mg, 40 mg, Oral, Daily, [name], MD, 40 mg at 11/17/20 1004 .  polyvinyl alcohol (LIQUIFILM TEARS) 1.4 % ophthalmic solution 1 drop, 1 drop, Both Eyes, PRN, [name], MD .  senna-docusate (Senokot-S) tablet 1 tablet, 1 tablet, Oral, QHS PRN, [name], MD .  sodium chloride flush (NS) 0.9 % injection 3 mL, 3 mL, Intravenous, Once, [name], [name], DO .  ticagrelor (BRILINTA) tablet 90 mg, 90 mg, Oral, BID, 90 mg at 11/17/20 2123 **OR**  ticagrelor (BRILINTA) tablet 90 mg, 90 mg, Per Tube, BID, [name], [name], MD .  zolpidem (  AMBIEN) tablet 5 mg, 5 mg, Oral, QHS PRN, [name], MD, 5 mg at 11/17/20 0055 .  zolpidem (AMBIEN) tablet 5 mg, 5 mg, Oral, QHS, [name], MD, 5 mg at 11/17/20 2123   Patients Current Diet:     Diet Order                      Diet Heart Room service appropriate? 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  Independent   Indoor Mobility: Did the patient need assistance with walking from room to room (with or without device)? Independent   Stairs: Did the patient need assistance with internal or external stairs (with or without device)? Independent   Functional Cognition: Did the patient need help planning regular tasks such as shopping or remembering to take medications? Independent   Home Assistive Devices / Equipment Home Assistive Devices/Equipment: None Home Equipment: None   Prior Device Use: Indicate devices/aids used by the patient prior to current illness, exacerbation or injury? None of the above   Current Functional Level Cognition   Overall Cognitive Status: Impaired/Different from baseline Orientation Level: Oriented X4 General Comments: Slow processing with cues, needing VCs to sequence RW and feet with turns.    Extremity Assessment (includes Sensation/Coordination)   Upper Extremity  Assessment: Defer to OT evaluation LUE Deficits / Details: grossly 2/5 some digit movement, did not attempt wrist (A line still in place), elbow 2-/5 LUE Sensation: decreased light touch LUE Coordination: decreased fine motor,decreased gross motor  Lower Extremity Assessment: LLE deficits/detail LLE Deficits / Details: knee extension 4+, ankle DF 5/5 LLE Sensation:  (? decreased as pt with difficulty keeping eyes closed so unsure if appropriate responses)     ADLs   Overall ADL's : Needs assistance/impaired Eating/Feeding: NPO Grooming: Minimal assistance,Sitting Grooming Details (indicate cue type and reason): requires assist for BUE tasks, able to use RUE normally Upper Body Bathing: Moderate assistance,Sitting Lower Body Bathing: Maximal assistance Upper Body Dressing : Maximal assistance Lower Body Dressing: Total assistance Toilet Transfer: Moderate assistance,+2 for physical assistance,+2 for safety/equipment,Stand-pivot,BSC Toilet Transfer Details (indicate cue type and reason): 2 person HHA Toileting- Clothing Manipulation and Hygiene: Moderate assistance,+2 for physical assistance,+2 for safety/equipment,Sit to/from stand Functional mobility during ADLs: +2 for physical assistance,+2 for safety/equipment,Minimal assistance (2 person HHA) General ADL Comments: L inattention     Mobility   Overal bed mobility: Needs Assistance Bed Mobility: Supine to Sit,Sit to Supine Supine to sit: Min assist,HOB elevated Sit to supine: Min assist General bed mobility comments: Cued pt to "punch" L UE across body with AAROM provided. Cued pt to sldie legs off R EOB and grab R rail with R hand to transition onto R elbow > hand to ascend trunk, needing minA to initiate but once elbow was under her torso she completed transition to sit. MinA to manage L leg and  arm back into bed safely.     Transfers   Overall transfer level: Needs assistance Equipment used: Rolling walker (2 wheeled) Transfers:  Sit to/from Stand Sit to Stand: Min assist,From elevated surface General transfer comment: L UE on RW to start with R hand on bed to push up, minA for steadying and maintaining L hand contact with RW.     Ambulation / Gait / Stairs / Wheelchair Mobility   Ambulation/Gait Ambulation/Gait assistance: Herbalist (Feet): 60 Feet Assistive device: Rolling walker (2 wheeled) Gait Pattern/deviations: Step-through pattern,Decreased stride length,Decreased step length - left,Decreased dorsiflexion - left General Gait Details: Pt tends to look to R. Needed cues to turn either direction to safely manage lines. Hand-over-hand intermittently and repositioning of L hand on RW, poor awareness when it would slip out of position. Decreased L step length needing tactile and verbal cues to minimally correct. Repeated cues for sequencing RW and feet movement with turning and to remain within RW. 1 LOB posteriorly, needing minA to recover. Gait velocity: decr Gait velocity interpretation: <1.31 ft/sec, indicative of household ambulator     Posture / Balance Balance Overall balance assessment: Needs assistance Sitting-balance support: Single extremity supported,Feet supported Sitting balance-Leahy Scale: Fair Standing balance support: Bilateral upper extremity supported Standing balance-Leahy Scale: Poor Standing balance comment: dependent on at least one UE support for balance     Special needs/care consideration CPAP sleep trial study while in hospital Mom, Cecille Rubin is visitor        Previous Home Environment  Living Arrangements: Spouse/significant other,Children  Lives With: Spouse (31 and 68 year old children) Available Help at Discharge: Family,Available 24 hours/day (Mom works for [employer]  and can work remote) Type of Home: [place]: Two level,Bed/bath upstairs,Able to live on main level with bedroom/bathroom (her bedroom upstairs; can stay on first floor) Alternate Level  Stairs-Rails: Right Alternate Level Stairs-Number of Steps: 13 Home Access: Level entry Bathroom Shower/Tub: Multimedia programmer: Standard Bathroom Accessibility: Yes How Accessible: Accessible via walker Hubbard: No Additional Comments: Had just moved into new home pta   Discharge Living Setting Plans for Discharge Living Setting: Patient's home,Lives with (comment) (spouse  and 52 and 72 year old children) Type of Home at Discharge: House Discharge Home Layout: Two level,Bed/bath upstairs Alternate Level Stairs-Rails: Right Alternate Level Stairs-Number of Steps: 13 Discharge Home Access: Level entry Discharge Bathroom Shower/Tub: Walk-in shower Discharge Bathroom Toilet: Standard Discharge Bathroom Accessibility: Yes How Accessible: Accessible via walker Does the patient have any problems obtaining your medications?: No   Social/Family/Support Systems Patient Roles: Engineer, 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 Information: spouse and Mom Anticipated Caregiver: Mom and spouse Anticipated Caregiver's Contact Information: [place] [phone] and Mom [phone] Ability/Limitations of Caregiver: Mom can work remote from home Caregiver Availability: 24/7 Discharge Plan Discussed with Primary Caregiver: Yes Is Caregiver In Agreement with Plan?: Yes Does Caregiver/Family have Issues with Lodging/Transportation while Pt is in Rehab?: No   Goals Patient/Family Goal for Rehab: supervision ot min with pT and OT, supervision with SLP Expected length of stay: ELOS 13 to 16 days Pt/Family Agrees to Admission and willing to participate: Yes Program Orientation Provided & Reviewed with Pt/Caregiver Including Roles  & Responsibilities: Yes   Decrease burden of Care through IP rehab admission: n/a   Possible need for SNF placement upon discharge:not anticipated   Patient Condition: This patient's medical and functional status has changed since the consult dated:  11/15/2020 in which the Rehabilitation Physician determined  and documented that the patient's condition is appropriate for intensive rehabilitative care in an inpatient rehabilitation facility. See "History of Present Illness" (above) for medical update. Functional changes are: min assist overall. Patient's medical and functional status update has been discussed with the Rehabilitation physician and patient remains appropriate for inpatient rehabilitation. Will admit to inpatient rehab today.   Preadmission Screen Completed By:  Cleatrice Burke, RN, 11/18/2020 11:10 AM ______________________________________________________________________   Discussed status with Dr. Naaman Plummer on 11/18/2020 at  1121 and received approval for admission today.   Admission Coordinator:  Cleatrice Burke, time 9458 Date 11/18/2020             Cosigned by: Meredith Staggers, MD at 11/18/2020 11:39 AM    Revision History                    Note Details  Author Dana Gong, RN File Time 11/18/2020 11:22 AM  Author Type Rehab Admission Coordinator Status Signed  Last Editor Dana Gong, RN Service Physical Medicine and Rehabilitation

## 2020-11-18 NOTE — Progress Notes (Signed)
Report called to 4W Nurse Pearline Cables.  CNA transported pt to rm 4W02.  Mother with pt

## 2020-11-18 NOTE — TOC Transition Note (Signed)
Transition of Care [facility]) - CM/SW Discharge Note   Patient Details  Name: Dana Henry MRN: [id number] Date of Birth: 1981/07/27  Transition of Care [facility]) CM/SW Contact:  Pollie Friar, RN Phone Number: 11/18/2020, 2:19 PM   Clinical Narrative:    Pt discharging to CIR today. CM signing off.   Final next level of care: IP Rehab Facility Barriers to Discharge: No Barriers Identified   Patient Goals and CMS Choice     Choice offered to / list presented to : Patient  Discharge Placement                       Discharge Plan and Services                                     Social Determinants of Health (SDOH) Interventions     Readmission Risk Interventions No flowsheet data found.

## 2020-11-18 NOTE — Interval H&P Note (Signed)
History and Physical Interval Note:  11/18/2020 8:29 AM  Dana Henry  has presented today for surgery, with the diagnosis of STROKE.  The various methods of treatment have been discussed with the patient and family. After consideration of risks, benefits and other options for treatment, the patient has consented to  Procedure(s): TRANSESOPHAGEAL ECHOCARDIOGRAM (TEE) (N/A) as a surgical intervention.  The patient's history has been reviewed, patient examined, no change in status, stable for surgery.  I have reviewed the patient's chart and labs.  Questions were answered to the patient's satisfaction.     Elouise Munroe

## 2020-11-18 NOTE — Progress Notes (Signed)
SLP Cancellation Note  Patient Details Name: Dana Henry MRN: [id number] DOB: 04-14-1981   Cancelled evaluation: Pt is being D/Cd to CIR today. Will defer speech/language eval to CIR SLP.  Alima Naser L. Tivis Ringer, [place] CCC/SLP Acute Rehabilitation Services Office number [phone] Pager [phone]            Juan Quam Laurice 11/18/2020, 11:10 AM

## 2020-11-18 NOTE — H&P (Signed)
Physical Medicine and Rehabilitation Admission H&P     HPI: Dana Henry is a 40 year old right-handed female history of migraine headaches, Covid infection 09/2020 and remote tobacco abuse. Patient lives with spouse and 2 young daughters. Independent prior to admission. Two-level home bed and bath on main level. Presented 11/13/2020 with acute onset of left-sided weakness. She was noted to have a low-grade fever. Admission chemistries unremarkable except creatinine 1.06, WBC 8000, hemoglobin 14, SARS coronavirus negative, urine drug screen positive marijuana. Cranial CT scan showed no acute intracranial hemorrhage. Acute infarct involving the right caudate, adjacent white matter and superior lentiform nucleus. Patient did not receive TPA. 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 Remainder of proximal intracranial vessels were patent. No occlusion or hemodynamically significant stenosis in the neck. Patient underwent cerebral arteriogram with emergent stent assisted angioplasty of the right ICA per interventional radiology. Echocardiogram with ejection fraction of 60 to 65%. No wall motion abnormalities. 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 No associated hemorrhage or mass-effect. Patient currently maintained on aspirin as well as Brilinta for CVA prophylaxis. Subcutaneous Lovenox for DVT prophylaxis. Initially on Cleviprex for blood pressure control. Plan for TEE 11/18/2020 showed no hemodynamically significant valvular heart disease.  No LA appendage or thrombus.  No LV apical thrombus.. Due to patient decreased functional ability left side weakness she was admitted for a comprehensive rehab program.  Review of Systems  Constitutional: Positive for malaise/fatigue. Negative for  chills and fever.  HENT: Negative for hearing loss.   Eyes: Negative for blurred vision and double vision.  Respiratory: Negative for cough and shortness of breath.   Cardiovascular: Negative for chest pain, palpitations and leg swelling.  Gastrointestinal: Positive for constipation. Negative for heartburn, nausea and vomiting.  Genitourinary: Negative for dysuria, flank pain and hematuria.  Musculoskeletal: Positive for myalgias.  Skin: Negative for rash.  Neurological: Positive for weakness and headaches.  All other systems reviewed and are negative.  Past Medical History:  Diagnosis Date  . Anemia   . History of cesarean delivery affecting pregnancy 08/10/2017  . Kidney stones    surgically removed at 40YO and 40YO  . Migraines    associated with menstrual cycle  . UTI (urinary tract infection)   . Vaginal Pap smear, abnormal    Past Surgical History:  Procedure Laterality Date  . CESAREAN SECTION N/A 07/21/2014   Procedure: CESAREAN SECTION;  Surgeon: Elveria Royals, MD;  Location: [facility];  Service: Obstetrics;  Laterality: N/A;  . CESAREAN SECTION N/A 08/10/2017   Procedure: Repeat CESAREAN SECTION;  Surgeon: Azucena Fallen, MD;  Location: [facility];  Service: Obstetrics;  Laterality: N/A;  EDD: 08/17/17 Allergy: Sulfa  . KIDNEY STONE SURGERY    . RADIOLOGY WITH ANESTHESIA N/A 11/13/2020   Procedure: IR WITH ANESTHESIA;  Surgeon: Radiologist, Medication, MD;  Location: [facility];  Service: Radiology;  Laterality: N/A;  . TONSILLECTOMY AND ADENOIDECTOMY  1991   Family History  Problem Relation Age of Onset  . Hypertension Mother   . Alcohol abuse Father    Social History:  reports that she quit smoking about 7 years ago. Her smoking use included cigarettes. She smoked 0.50 packs per day. She has never used smokeless tobacco. She reports current alcohol use. She reports that she does not use drugs. Allergies:  Allergies  Allergen  Reactions  . Sulfa Antibiotics Hives  and Swelling   Medications Prior to Admission  Medication Sig Dispense Refill  . ibuprofen (ADVIL) 200 MG tablet Take 400 mg by mouth every 6 (six) hours as needed for mild pain or headache.    . Multiple Vitamins-Minerals (MULTIVITAMIN WITH MINERALS) tablet Take 1 tablet by mouth daily.      Drug Regimen Review Drug regimen was reviewed and remains appropriate with no significant issues identified  Home: Home Living Family/patient expects to be discharged to:: Private residence Living Arrangements: Spouse/significant other,Children Available Help at Discharge: Family Type of Home: House Home Access: Level entry Home Layout: Two level,Bed/bath upstairs,Able to live on main level with bedroom/bathroom (her bedroom upstairs; can stay on first floor) Alternate Level Stairs-Number of Steps: 13 Alternate Level Stairs-Rails: Right Bathroom Shower/Tub: Multimedia programmer: Standard Home Equipment: None   Functional History: Prior Function Level of Independence: Independent 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 Cued pt to sldie legs off R EOB and grab R rail with R hand to transition onto R elbow > hand to ascend trunk, needing minA to initiate but once elbow was under her torso she completed transition to sit. MinA to manage L leg and arm back into bed safely. Transfers Overall transfer level: Needs assistance Equipment used: Rolling walker (2 wheeled) Transfers: Sit to/from Stand Sit to Stand: Min assist,From elevated surface General transfer comment: L UE on RW to start with R hand on bed to push up, minA for steadying and maintaining L hand contact with RW. Ambulation/Gait Ambulation/Gait  assistance: Min assist Gait Distance (Feet): 60 Feet Assistive device: Rolling walker (2 wheeled) Gait Pattern/deviations: Step-through pattern,Decreased stride length,Decreased step length - left,Decreased dorsiflexion - left General Gait Details: Pt tends to look to R. Needed cues to turn either direction to safely manage lines. Hand-over-hand intermittently and repositioning of L hand on RW, poor awareness when it would slip out of position. Decreased L step length needing tactile and verbal cues to minimally correct. Repeated cues for sequencing RW and feet movement with turning and to remain within RW. 1 LOB posteriorly, needing minA to recover. Gait velocity: decr Gait velocity interpretation: <1.31 ft/sec, indicative of household ambulator    ADL: ADL Overall ADL's : Needs assistance/impaired Eating/Feeding: NPO Grooming: Minimal assistance,Sitting Grooming Details (indicate cue type and reason): requires assist for BUE tasks, able to use RUE normally Upper Body Bathing: Moderate assistance,Sitting Lower Body Bathing: Maximal assistance Upper Body Dressing : Maximal assistance Lower Body Dressing: Total assistance Toilet Transfer: Moderate assistance,+2 for physical assistance,+2 for safety/equipment,Stand-pivot,BSC Toilet Transfer Details (indicate cue type and reason): 2 person HHA Toileting- Clothing Manipulation and Hygiene: Moderate assistance,+2 for physical assistance,+2 for safety/equipment,Sit to/from stand Functional mobility during ADLs: +2 for physical assistance,+2 for safety/equipment,Minimal assistance (2 person HHA) General ADL Comments: L inattention  Cognition: Cognition Overall Cognitive Status: Impaired/Different from baseline Orientation Level: Oriented X4 Cognition Arousal/Alertness: Awake/alert Behavior During Therapy: WFL for tasks assessed/performed Overall Cognitive Status: Impaired/Different from baseline Area of Impairment: Problem solving Problem  Solving: Slow processing,Difficulty sequencing,Requires verbal cues General Comments: Slow processing with cues, needing VCs to sequence RW and feet with turns.  Physical Exam: Blood pressure (!) 112/57, pulse 77, temperature 98 F (36.7  C), temperature source Oral, resp. rate 18, last menstrual period 11/05/2020, SpO2 98 %, currently breastfeeding. Physical Exam Constitutional:      General: She is not in acute distress.    Appearance: Normal appearance.  HENT:     Head: Normocephalic and atraumatic.     Right Ear: External ear normal.     Left Ear: External ear normal.     Nose: Nose normal.     Mouth/Throat:     Mouth: Mucous membranes are moist.     Pharynx: Oropharynx is clear. No oropharyngeal exudate.  Eyes:     Extraocular Movements: Extraocular movements intact.     Pupils: Pupils are equal, round, and reactive to light.  Cardiovascular:     Rate and Rhythm: Normal rate and regular rhythm.     Heart sounds: No murmur heard. No gallop.   Pulmonary:     Effort: Pulmonary effort is normal. No respiratory distress.     Breath sounds: No wheezing, rhonchi or rales.  Abdominal:     General: Bowel sounds are normal. There is no distension.     Palpations: Abdomen is soft.     Tenderness: There is no abdominal tenderness.  Musculoskeletal:        General: No swelling or tenderness. Normal range of motion.     Cervical back: Normal range of motion and neck supple.  Skin:    General: Skin is warm and dry.  Neurological:     Mental Status: She is alert.     Comments: Alert and oriented x 3. Normal insight and awareness. Intact Memory. Normal language and speech. Cranial nerve exam unremarkable Patient is alert in no acute distress. Makes eye contact with examiner. Follows commands. LUE 3-/5 prox to 3/5 distally. LLE 4/5 prox to distal. No focal sensory deficits. DTR's1+  Psychiatric:        Mood and Affect: Mood normal.        Behavior: Behavior normal.        Thought  Content: Thought content normal.        Judgment: Judgment normal.     Results for orders placed or performed during the hospital encounter of 11/13/20 (from the past 48 hour(s))  CBC     Status: Abnormal   Collection Time: 11/17/20  1:12 AM  Result Value Ref Range   WBC 6.2 4.0 - 10.5 K/uL   RBC 3.63 (L) 3.87 - 5.11 MIL/uL   Hemoglobin 11.4 (L) 12.0 - 15.0 g/dL   HCT 31.6 (L) 36.0 - 46.0 %   MCV 87.1 80.0 - 100.0 fL   MCH 31.4 26.0 - 34.0 pg   MCHC 36.1 (H) 30.0 - 36.0 g/dL   RDW 12.5 11.5 - 15.5 %   Platelets 222 150 - 400 K/uL   nRBC 0.0 0.0 - 0.2 %    Comment: Performed at [facility], [place] [address] [place], [place] [address]  Basic metabolic panel     Status: Abnormal   Collection Time: 11/17/20  1:12 AM  Result Value Ref Range   Sodium 137 135 - 145 mmol/L   Potassium 3.4 (L) 3.5 - 5.1 mmol/L   Chloride 106 98 - 111 mmol/L   CO2 20 (L) 22 - 32 mmol/L   Glucose, Bld 101 (H) 70 - 99 mg/dL    Comment: Glucose reference range applies only to samples taken after fasting for at least 8 hours.   BUN 9 6 - 20 mg/dL   Creatinine, Ser  0.82 0.44 - 1.00 mg/dL   Calcium 8.7 (L) 8.9 - 10.3 mg/dL   GFR, Estimated >60 >60 mL/min    Comment: (NOTE) Calculated using the CKD-EPI Creatinine Equation (2021)    Anion gap 11 5 - 15    Comment: Performed at [facility] [address] [place], [place] [address]  CBC     Status: Abnormal   Collection Time: 11/18/20  1:54 AM  Result Value Ref Range   WBC 6.5 4.0 - 10.5 K/uL   RBC 3.63 (L) 3.87 - 5.11 MIL/uL   Hemoglobin 11.3 (L) 12.0 - 15.0 g/dL   HCT 31.7 (L) 36.0 - 46.0 %   MCV 87.3 80.0 - 100.0 fL   MCH 31.1 26.0 - 34.0 pg   MCHC 35.6 30.0 - 36.0 g/dL   RDW 12.7 11.5 - 15.5 %   Platelets 216 150 - 400 K/uL   nRBC 0.0 0.0 - 0.2 %    Comment: Performed at [facility], [place] [address] [place], [place] [address]  Basic metabolic panel     Status: Abnormal   Collection Time: 11/18/20  1:54 AM  Result Value Ref  Range   Sodium 136 135 - 145 mmol/L   Potassium 3.3 (L) 3.5 - 5.1 mmol/L   Chloride 106 98 - 111 mmol/L   CO2 20 (L) 22 - 32 mmol/L   Glucose, Bld 95 70 - 99 mg/dL    Comment: Glucose reference range applies only to samples taken after fasting for at least 8 hours.   BUN 10 6 - 20 mg/dL   Creatinine, Ser 0.81 0.44 - 1.00 mg/dL   Calcium 9.0 8.9 - 10.3 mg/dL   GFR, Estimated >60 >60 mL/min    Comment: (NOTE) Calculated using the CKD-EPI Creatinine Equation (2021)    Anion gap 10 5 - 15    Comment: Performed at [facility] [address], [place], [place] [address]   No results found.     Medical Problem List and Plan: 1. Left side weakness secondary to acute ischemic right MCA infarction with emergent stenting angioplasty right ICA per interventional radiology  -patient may shower  -ELOS/Goals: 12-15 days, supervision with PT, OT and sup/mod I with SLP. Pt may exceed these projected dates/goals. 2.  Antithrombotics: -DVT/anticoagulation: Lovenox  -antiplatelet therapy: Aspirin 81 mg daily and Brilinta 90 mg twice daily 3. Pain Management: Tylenol as needed 4. Mood: Provide emotional support  -antipsychotic agents: N/A 5. Neuropsych: This patient is capable of making decisions on her own behalf. 6. Skin/Wound Care: Routine skin checks 7. Fluids/Electrolytes/Nutrition: Routine in and outs with follow-up chemistries 8. Remote Covid infection 09/2020. SARS corona is negative on admission. 9. Remote tobacco abuse. Urine drug screen positive marijuana. 10.Obesity.BMI 30.90.Dietery follow up     Cathlyn Parsons, PA-C 11/18/2020

## 2020-11-18 NOTE — Transfer of Care (Signed)
Immediate Anesthesia Transfer of Care Note  Patient: Dana Henry  Procedure(s) Performed: TRANSESOPHAGEAL ECHOCARDIOGRAM (TEE) (N/A ) BUBBLE STUDY  Patient Location: Endoscopy Unit  Anesthesia Type:MAC  Level of Consciousness: awake, alert  and oriented  Airway & Oxygen Therapy: Patient Spontanous Breathing  Post-op Assessment: Report given to RN and Post -op Vital signs reviewed and stable  Post vital signs: Reviewed and stable  Last Vitals:  Vitals Value Taken Time  BP 95/64 11/18/20 0929  Temp    Pulse 84 11/18/20 0932  Resp 23 11/18/20 0932  SpO2 91 % 11/18/20 0932  Vitals shown include unvalidated device data.  Last Pain:  Vitals:   11/18/20 0929  TempSrc:   PainSc: 0-No pain         Complications: No complications documented.

## 2020-11-18 NOTE — Progress Notes (Signed)
Inpatient Rehabilitation Admissions Coordinator   I have insurance approval and CIR bed to admit her to today. I met at bedside with patient, her Mom and Dr. Leonie Man. I will make the arrangements to admit today. Acute team and TOC made aware.  Danne Baxter, RN, MSN Rehab Admissions Coordinator [phone] 11/18/2020 11:01 AM

## 2020-11-18 NOTE — Hospital Course (Signed)
Dear Dana Henry,  You presented to hospital after stroke and had a procedure and stent placement. To do list: 1.  Please continue taking aspirin and Brilinta for 3 months and then aspirin alone. 2.  Please make an appointment to see outpatient neurologist in 4 to 6 weeks after discharge from inpatient rehabilitation. 3.  Please see your PCP in 1 to 2 weeks after discharge from inpatient rehabilitation.  It was a pleasure to take care of you! Dr. Demaris Callander

## 2020-11-18 NOTE — Progress Notes (Signed)
Dana Henry was requesting Ambien, pharmacy was called. She had received Ambien 5 mg for the last three nights. We will order Trazodone 25 mg HS for tonight. Order will be given to [facility].

## 2020-11-18 NOTE — Discharge Summary (Addendum)
Stroke Discharge Summary  Patient ID: Dana Henry   MRN: [id number]      DOB: 03-08-1981  Date of Admission: 11/13/2020 Date of Discharge: 11/18/2020  Attending Physician:  Dana Fila, MD, Stroke MD Consultant(s):   cardiology Patient's PCP:  Dana Mclean, MD  Discharge Diagnoses:  1. 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 3.  Left hemiparesis 4.  Small PFO likely incidental Active Problems:   Stroke (cerebrum) (HCC)   Internal carotid artery stenosis, right   Dysphagia, post-stroke   Acute blood loss anemia   Medications to be continued on Rehab Allergies as of 11/18/2020      Reactions   Sulfa Antibiotics Hives, Swelling      Medication List    ASK your doctor about these medications   ibuprofen 200 MG tablet Commonly known as: ADVIL Take 400 mg by mouth every 6 (six) hours as needed for mild pain or headache.   multivitamin with minerals tablet Take 1 tablet by mouth daily.       LABORATORY STUDIES CBC    Component Value Date/Time   WBC 6.5 11/18/2020 0154   RBC 3.63 (L) 11/18/2020 0154   HGB 11.3 (L) 11/18/2020 0154   HCT 31.7 (L) 11/18/2020 0154   PLT 216 11/18/2020 0154   MCV 87.3 11/18/2020 0154   MCH 31.1 11/18/2020 0154   MCHC 35.6 11/18/2020 0154   RDW 12.7 11/18/2020 0154   LYMPHSABS 1.5 11/13/2020 1023   MONOABS 0.5 11/13/2020 1023   EOSABS 0.0 11/13/2020 1023   BASOSABS 0.0 11/13/2020 1023   CMP    Component Value Date/Time   NA 136 11/18/2020 0154   K 3.3 (L) 11/18/2020 0154   CL 106 11/18/2020 0154   CO2 20 (L) 11/18/2020 0154   GLUCOSE 95 11/18/2020 0154   BUN 10 11/18/2020 0154   CREATININE 0.81 11/18/2020 0154   CALCIUM 9.0 11/18/2020 0154   PROT 7.4 11/13/2020 1023   ALBUMIN 4.3  11/13/2020 1023   AST 27 11/13/2020 1023   ALT 37 11/13/2020 1023   ALKPHOS 33 (L) 11/13/2020 1023   BILITOT 1.3 (H) 11/13/2020 1023   GFRNONAA >60 11/18/2020 0154   COAGS Lab Results  Component Value Date   INR 1.1 11/13/2020   Lipid Panel    Component Value Date/Time   CHOL 156 11/14/2020 0549   TRIG 111 11/14/2020 0549   HDL 38 (L) 11/14/2020 0549   CHOLHDL 4.1 11/14/2020 0549   VLDL 22 11/14/2020 0549   LDLCALC 96 11/14/2020 0549   HgbA1C  Lab Results  Component Value Date   HGBA1C 5.1 11/14/2020   Urinalysis    Component Value Date/Time   BILIRUBINUR neg 10/06/2012 1424   PROTEINUR neg 10/06/2012 1424   UROBILINOGEN 0.2 10/06/2012 1424   NITRITE neg 10/06/2012 1424   LEUKOCYTESUR Trace 10/06/2012 1424   Urine Drug Screen     Component Value Date/Time   LABOPIA NONE DETECTED 11/15/2020 1136   COCAINSCRNUR NONE DETECTED 11/15/2020 1136   LABBENZ NONE DETECTED 11/15/2020 1136   AMPHETMU NONE DETECTED 11/15/2020 1136   THCU POSITIVE (A) 11/15/2020 1136   LABBARB NONE DETECTED 11/15/2020 1136    Alcohol Level No alcohol use reported.  SIGNIFICANT DIAGNOSTIC STUDIES  CT Head code stroke wo contrast 11/13/2020  IMPRESSION: No acute intracranial hemorrhage. Acute infarction involving the right caudate, adjacent white matter, and superior lentiform nucleus. ASPECTS is 7.  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 Remainder of proximal intracranial vessels are patent.  Perfusion imaging demonstrates no evidence of core infarction despite findings on noncontrast head CT. Calculated territory at risk of 242 mL throughout the right MCA territory as well as MCA/ACA and MCA/PCA watershed territories.  No occlusion or hemodynamically significant stenosis in the neck.  CT head wo contrast 11/13/2020  IMPRESSION: 1. Unchanged acute right basal ganglia  infarct. 2. No evidence of hemorrhage or other new intracranial abnormality.  Chest x-ray 11/13/2020  IMPRESSION: No active disease.  MR Brain  11/14/2020  1. 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 No associated hemorrhage or significant mass effect. 2. Otherwise normal brain MRI for age.  MRA Head  11/14/2020  MRA HEAD IMPRESSION:  1. Sequelae of recent stent placement within the petrous right ICA. Evaluation for implant stenosis limited by susceptibility artifact, however, widely patent flow seen distally within the right carotid siphon. 2. No other hemodynamically significant stenosis or other vascular abnormality within the intracranial circulation. 3. Fetal type origin of both PCAs with overall diminutive vertebrobasilar system.  VAS [place] lower Extremity 11/14/2020  Summary:  BILATERAL:  - No evidence of deep vein thrombosis seen in the lower extremities,  bilaterally.  -No evidence of popliteal cyst, bilaterally.   Echocardiogram 11/14/2020  1. Left ventricular ejection fraction, by estimation, is 60 to 65%. The  left ventricle has normal function. The left ventricle has no regional  wall motion abnormalities. Left ventricular diastolic parameters were  normal.  2. Right ventricular systolic function is normal. The right ventricular  size is normal. There is normal pulmonary artery systolic pressure. The  estimated right ventricular systolic pressure is 0000000 mmHg.  3. The mitral valve is normal in structure. Trivial mitral valve  regurgitation. No evidence of mitral stenosis.  4. The aortic valve is tricuspid. Aortic valve regurgitation is not  visualized. No aortic stenosis is present.  5. The inferior vena cava is normal in size with greater than 50%  respiratory variability, suggesting right atrial  pressure of 3 mmHg.       HISTORY OF PRESENT ILLNESS per record:   Dana Henry is a 40 y.o. Caucasian female with PMH of migraine, COVID infection 09/2020 presented to ED for code stroke.  Pt pt, she started to have left sided numbness and weakness since Sunday. She was in [place] and she started to have left arm and leg numbness and left arm weaker than leg. She was able to walk and her husband was having cough and fever so she only told her husband and not anybody else. Her husband was tested COVID negative and gradually getting better. Pt symptoms did not improve or worsen. Yesterday she had fever 102 and went to [facility], per mom at that time, she still had difficulty with left arm weakness, only can hold halfway. Pt stated that her left arm and leg numbness and weakness remained the same. This morning around 9:15am, mom found pt to have right gaze and left facial droop and left sided weakness getting worse. EMS called and pt sent to [facility] for evaluation. 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  CTA head and neck showed right ICA petrous segment occlusion vs. High grade stenosis. CTP large penumbra on the right. Discussed with pt and family, they are in agreement for possible thrombectomy.   Pt not on medication at home. But per mom, pt has a lot of stress lately.   LSN: Sunday tPA Given: No: outside window mRS = 0   HOSPITAL COURSE Ms. Laveah Gloster is a 39 y.o. female with PMH X of migraine and COVID-19 infection in 09/2020 presented to ED for left arm and left leg numbness.  Left arm> weaker than left leg since Sunday and worsening left facial droop and right gaze preference.  CT head showed right caudate and basal ganglia subacute infarct no tPA administered, due to outside of window.  CT head and neck shows right ICA petrous segment occlusion versus high-grade stenosis.  CTP showed large penumbra on right and patient underwent successful thrombectomy.  MRI this morning  shows patchy multifocal acute ischemic right MCA territory infarct involving the subcortical and deep white matter of right frontal and parietal lobes, largely watershed in distribution.    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  Code Stroke CT head : No acute intracranial hemorrhage. Acute infarction involving the right caudate, adjacent white matter, and superior lentiform nucleus. 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  CT perfusion: no core, large right penumbra  MRI : Patchy multifocal acute ischemic right MCA territory infarcts involving the subcortical and deep white matter of the right frontal and parietal lobes, largely watershed in distribution.  MRA : Sequelae of recent stent placement within the petrous right ICA. widely patent flow seen distally within the right carotid siphon.  2D Echo: EF 60 to 65%.  Doppler Lower extremities [place]: No evidence of deep vein thrombosis   TCD bubble study: Positive PFO with spencer degree 2 on both rest and valsalva  TEE: Redundant atrial septum with possible color flow. PFO was unable to be demonstrated on this study with agitated saline.  LDL 96  HgbA1c 5.1  UDS + THC  Hypercoagulable panel negative   Autoimmune labs pending (see below)  VTE prophylaxis - Lovenox 40 mg  No antithrombotic prior to admission, now on aspirin 81 mg daily and Brilinta (ticagrelor) 90 mg bid.   Therapy recommendations: CIR  Disposition: Today in CIR  PFO  Positive TCD bubble study with spencer degree 2 at rest and with valsalva  TEE: Redundant atrial septum with possible color flow. PFO was unable to be demonstrated on this study with agitated saline.  May consider PFO closure , not for at least next 6 months as it would require discontinuation of antiplatelet therapy.  Hypertension  Home  meds: None  BP fluctuate  Long-term BP goal normotensive  Hyperlipidemia  Home meds: None  LDL 96, goal < 70  On lipitor 40  Continue statin at discharge  Anemia   Hb 14.0->10.5->11.3  Could be due to acute blood loss with procedure  CBC monitoring  Other Stroke Risk Factors  Obesity: Patient will be advised to exercise and lose weight.  Past COVID-19 infection on 09/2020  Migraine   Additional Labs  Cardiolipin antibodies - < 9- normal  Prothrombin Gene Mutation-pending  Lupus Anticoagulant - 32.8- normal  Beta - 2 Glyco 1 IgG - < 9- normal  Homocysteine -12.8 - normal  DRVVT -  36 - normal  ANCA Proteinase 3 - < 3.5 - normal  ANCA titers - pending  ANA, IFA with reflex - pending   CRP - 0.6 - normal  Sed rate - 14 - normal  RPR - non reactive - normal   DISCHARGE EXAM Blood pressure (!) 110/47, pulse 71, temperature 98.1 F (36.7 C), temperature source Oral, resp. rate 16, last menstrual period 11/05/2020, SpO2 94 %, currently breastfeeding. General: Well developed, obese young female lying comfortably in bed, NAD HEENT: [place]/AT, MMM, PERRLA Cardiovascular: Regular rate and rhythm  Respiratory: No respiratory distress Abdominal: Soft, nontender, no rigidity Psych: Normal mood and affect Neurological:  Mental Status: Alert, oriented, thought content appropriate.  Speech fluent without evidence of aphasia.  Able to follow pull commands without difficulty.  No dysarthria Cranial Nerves: II:  Visual fields grossly normal, pupils equal, round, reactive to light and accommodation III,IV, VI: ptosis not present, extra-ocular motions intact bilaterally, right gaze preference resolved V,VII: smile asymmetric, left facial droop,facial light touch sensation decreased on left around 70% as compared to 100% on right VIII: hearing normal bilaterally IX,X: uvula rises symmetrically XI: bilateral shoulder shrug XII: midline tongue extension  without atrophy or fasciculations  Motor: Right :  Upper extremity   5/5                                      Left:     Upper extremity   proximal 2/5, bicep 3 / 5, tricep 3/5,                                                                                                  finger grip 2/5             Lower extremity   5/5                                                  Lower extremity   5/5 Tone and bulk:normal tone throughout; no atrophy noted Sensory: Pinprick and light touch decreased by 30% on left side. Deep Tendon Reflexes:  Right: Upper Extremity                       Left: Upper extremity   biceps (C-5 to C-6) 2/4                       biceps (C-5 to C-6) 2/4 tricep (C7) 2/4                                     triceps (C7) 2/4 Brachioradialis (C6) 2/4                      Brachioradialis (C6) 2/4  Lower Extremity Lower  Extremity  quadriceps (L-2 to L-4) 2/4                 quadriceps (L-2 to L-4) 2/4 Achilles (S1) 2/4                                  Achilles (S1) 2/4  Plantars: Right: downgoing                                Left: downgoing Cerebellar: normal finger-to-nose  Gait: Deferred  NIHSS 2 premornid MRs 0   Discharge Diet      Diet   Diet Heart Room service appropriate? Yes; Fluid consistency: Thin   liquids  DISCHARGE PLAN  Disposition:  Transfer to [place] for ongoing PT, OT and ST  aspirin 81 mg daily and Brilinta (ticagrelor) 90 mg bid for secondary stroke prevention for 3 months then aspirin 81 mg alone.  Recommend ongoing stroke risk factor control by Primary Care Physician at time of discharge from inpatient rehabilitation.  Follow-up PCP Copland, Gay Filler, MD in 2 weeks following discharge from rehab.  Follow-up in [facility] in 4 weeks following discharge from rehab, office to schedule an appointment.   35 minutes were spent preparing discharge.  I have personally obtained  history,examined this patient, reviewed notes, independently viewed imaging studies, participated in medical decision making and plan of care.ROS completed by me personally and pertinent positives fully documented  I have made any additions or clarifications directly to the above note. Agree with note above.  Continue aspirin and Plavix given risk you carotid stent and may consider a possible endovascular PFO closure after 6 months the need for dual antiplatelet therapy is no longer necessary.  Consider possible participation in the sleep smart study patient was given information to review and decide.  Follow-up as an outpatient stroke clinic in 6 weeks.  Antony Contras, MD Medical Director [facility] Pager: [phone] 11/18/2020 3:50 PM

## 2020-11-18 NOTE — Anesthesia Procedure Notes (Signed)
Procedure Name: MAC Performed by: Valda Favia, CRNA Pre-anesthesia Checklist: Patient identified, Emergency Drugs available, Suction available, Patient being monitored and Timeout performed Patient Re-evaluated:Patient Re-evaluated prior to induction Oxygen Delivery Method: Nasal cannula Airway Equipment and Method: Bite block Placement Confirmation: positive ETCO2 Dental Injury: Teeth and Oropharynx as per pre-operative assessment

## 2020-11-19 ENCOUNTER — Other Ambulatory Visit: Payer: Self-pay

## 2020-11-19 ENCOUNTER — Encounter (HOSPITAL_COMMUNITY): Payer: Self-pay | Admitting: Internal Medicine

## 2020-11-19 DIAGNOSIS — K5901 Slow transit constipation: Secondary | ICD-10-CM | POA: Diagnosis not present

## 2020-11-19 DIAGNOSIS — E8809 Other disorders of plasma-protein metabolism, not elsewhere classified: Secondary | ICD-10-CM

## 2020-11-19 DIAGNOSIS — E46 Unspecified protein-calorie malnutrition: Secondary | ICD-10-CM

## 2020-11-19 DIAGNOSIS — F1210 Cannabis abuse, uncomplicated: Secondary | ICD-10-CM | POA: Diagnosis not present

## 2020-11-19 DIAGNOSIS — G8194 Hemiplegia, unspecified affecting left nondominant side: Secondary | ICD-10-CM

## 2020-11-19 DIAGNOSIS — I63511 Cerebral infarction due to unspecified occlusion or stenosis of right middle cerebral artery: Secondary | ICD-10-CM | POA: Diagnosis not present

## 2020-11-19 LAB — CBC WITH DIFFERENTIAL/PLATELET
Abs Immature Granulocytes: 0.02 10*3/uL (ref 0.00–0.07)
Basophils Absolute: 0 10*3/uL (ref 0.0–0.1)
Basophils Relative: 1 %
Eosinophils Absolute: 0.2 10*3/uL (ref 0.0–0.5)
Eosinophils Relative: 3 %
HCT: 34.9 % — ABNORMAL LOW (ref 36.0–46.0)
Hemoglobin: 11.8 g/dL — ABNORMAL LOW (ref 12.0–15.0)
Immature Granulocytes: 0 %
Lymphocytes Relative: 28 %
Lymphs Abs: 1.8 10*3/uL (ref 0.7–4.0)
MCH: 30.3 pg (ref 26.0–34.0)
MCHC: 33.8 g/dL (ref 30.0–36.0)
MCV: 89.5 fL (ref 80.0–100.0)
Monocytes Absolute: 0.4 10*3/uL (ref 0.1–1.0)
Monocytes Relative: 6 %
Neutro Abs: 3.9 10*3/uL (ref 1.7–7.7)
Neutrophils Relative %: 62 %
Platelets: 229 10*3/uL (ref 150–400)
RBC: 3.9 MIL/uL (ref 3.87–5.11)
RDW: 12.8 % (ref 11.5–15.5)
WBC: 6.4 10*3/uL (ref 4.0–10.5)
nRBC: 0 % (ref 0.0–0.2)

## 2020-11-19 LAB — COMPREHENSIVE METABOLIC PANEL
ALT: 34 U/L (ref 0–44)
AST: 28 U/L (ref 15–41)
Albumin: 3.5 g/dL (ref 3.5–5.0)
Alkaline Phosphatase: 30 U/L — ABNORMAL LOW (ref 38–126)
Anion gap: 11 (ref 5–15)
BUN: 13 mg/dL (ref 6–20)
CO2: 21 mmol/L — ABNORMAL LOW (ref 22–32)
Calcium: 9.1 mg/dL (ref 8.9–10.3)
Chloride: 106 mmol/L (ref 98–111)
Creatinine, Ser: 0.86 mg/dL (ref 0.44–1.00)
GFR, Estimated: >60 mL/min (ref >60)
Glucose, Bld: 94 mg/dL (ref 70–99)
Potassium: 3.6 mmol/L (ref 3.5–5.1)
Sodium: 138 mmol/L (ref 135–145)
Total Bilirubin: 0.9 mg/dL (ref 0.3–1.2)
Total Protein: 6.3 g/dL — ABNORMAL LOW (ref 6.5–8.1)

## 2020-11-19 LAB — GLUCOSE, CAPILLARY: Glucose-Capillary: 95 mg/dL (ref 70–99)

## 2020-11-19 LAB — ANCA TITERS
Atypical P-ANCA titer: <1:20 {titer}
C-ANCA: <1:20 {titer}
P-ANCA: <1:20 {titer}

## 2020-11-19 MED ORDER — PROSOURCE PLUS PO LIQD
30.0000 mL | Freq: Two times a day (BID) | ORAL | Status: DC
  Administered 2020-11-19 – 2020-11-20 (×4): 30 mL via ORAL
  Filled 2020-11-19 (×4): qty 30

## 2020-11-19 MED ORDER — POLYETHYLENE GLYCOL 3350 17 G PO PACK
17.0000 g | PACK | Freq: Two times a day (BID) | ORAL | Status: DC
  Administered 2020-11-19 – 2020-11-20 (×4): 17 g via ORAL
  Filled 2020-11-19 (×6): qty 1

## 2020-11-19 MED ORDER — TRAZODONE HCL 50 MG PO TABS
25.0000 mg | ORAL_TABLET | Freq: Every evening | ORAL | Status: DC | PRN
  Administered 2020-11-19 – 2020-11-21 (×3): 25 mg via ORAL
  Filled 2020-11-19 (×3): qty 1

## 2020-11-19 NOTE — Progress Notes (Signed)
Inpatient Rehabilitation Care Coordinator Assessment and Plan Patient Details  Name: Dana Henry MRN: [id number] Date of Birth: [birth year]  Today's Date: 11/19/2020  Hospital Problems: Principal Problem:   Right middle cerebral artery stroke [facility]) Active Problems:   Slow transit constipation   Hypoalbuminemia due to protein-calorie malnutrition (HCC)   Marijuana abuse   Left hemiparesis ([place])  Past Medical History:  Past Medical History:  Diagnosis Date  . Anemia   . History of cesarean delivery affecting pregnancy 08/10/2017  . Kidney stones    surgically removed at 40YO and 40YO  . Migraines    associated with menstrual cycle  . UTI (urinary tract infection)   . Vaginal Pap smear, abnormal    Past Surgical History:  Past Surgical History:  Procedure Laterality Date  . BUBBLE STUDY  11/18/2020   Procedure: BUBBLE STUDY;  Surgeon: Elouise Munroe, MD;  Location: [place];  Service: Cardiology;;  . CESAREAN SECTION N/A 07/21/2014   Procedure: CESAREAN SECTION;  Surgeon: Elveria Royals, MD;  Location: [facility];  Service: Obstetrics;  Laterality: N/A;  . CESAREAN SECTION N/A 08/10/2017   Procedure: Repeat CESAREAN SECTION;  Surgeon: Azucena Fallen, MD;  Location: [place];  Service: Obstetrics;  Laterality: N/A;  EDD: 08/17/17 Allergy: Sulfa  . IR CT HEAD LTD  11/13/2020  . IR INTRA CRAN STENT  11/13/2020  . IR PERCUTANEOUS ART THROMBECTOMY/INFUSION INTRACRANIAL INC DIAG ANGIO  11/13/2020  . KIDNEY STONE SURGERY    . RADIOLOGY WITH ANESTHESIA N/A 11/13/2020   Procedure: IR WITH ANESTHESIA;  Surgeon: Radiologist, Medication, MD;  Location: [place];  Service: Radiology;  Laterality: N/A;  . TEE WITHOUT CARDIOVERSION N/A 11/18/2020   Procedure: TRANSESOPHAGEAL ECHOCARDIOGRAM (TEE);  Surgeon: Elouise Munroe, MD;  Location: [place];  Service: Cardiology;  Laterality: N/A;  . TONSILLECTOMY AND ADENOIDECTOMY  1991   Social History:  reports that she quit  smoking about 7 years ago. Her smoking use included cigarettes. She smoked 0.50 packs per day. She has never used smokeless tobacco. She reports current alcohol use. She reports that she does not use drugs.  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 Return to Work Plans: yes Public relations account executive Issues: n/a Guardian/Conservator: n/a   Abuse/Neglect Abuse/Neglect Assessment Can Be Completed: Yes Physical Abuse: Denies Verbal Abuse: Denies Sexual Abuse: Denies Exploitation of patient/patient's resources: Denies Self-Neglect: Denies  Emotional Status Substance Abuse History: Positive marijuana screen/tobacco  Patient / Family Perceptions, Expectations & Goals Pt/Family understanding of illness & functional limitations: yes Premorbid pt/family roles/activities: Previously independent, driving, going to school working remote  [employer]: None Premorbid Home Care/DME Agencies: None Transportation available at discharge: family able to transport Resource referrals recommended: Neuropsychology (hx of substance and tobacco)  Discharge Planning Living Arrangements: Spouse/significant other,Parent,Children Support Systems: Spouse/significant other,Children,Parent Type of Residence: Private residence (2 level home, level entry, 13 steps to 2nd level) Insurance Resources: Multimedia programmer (specify) ([place]) Museum/gallery curator Resources: Employment Museum/gallery curator Screen Referred: No Living Expenses: Medical laboratory scientific officer Management:  Patient,Spouse Does the patient have any problems obtaining your medications?: No Home Management: Independent Care Coordinator Anticipated Follow Up Needs: HH/OP Expected length of stay: 13-16 Days  Clinical Impression  Sw met with patient introduced self, explained role and process. SW will continue to follow up. No additional questions or concerns   Dyanne Iha 11/19/2020, 1:43 PM

## 2020-11-19 NOTE — Plan of Care (Signed)
  Problem: Consults Goal: RH STROKE PATIENT EDUCATION Description: See Patient Education module for education specifics  Outcome: Progressing   Problem: RH SAFETY Goal: RH STG ADHERE TO SAFETY PRECAUTIONS W/ASSISTANCE/DEVICE Description: STG Adhere to Safety Precautions With Mod I Assistance/Device. Outcome: Progressing Goal: RH STG DECREASED RISK OF FALL WITH ASSISTANCE Description: STG Decreased Risk of Fall With Mod I Assistance. Outcome: Progressing   Problem: RH BOWEL ELIMINATION Goal: RH STG MANAGE BOWEL WITH ASSISTANCE Description: STG Manage Bowel with mod I Assistance. Outcome: Progressing   Problem: RH PAIN MANAGEMENT Goal: RH STG PAIN MANAGED AT OR BELOW PT'S PAIN GOAL Description: Pain scale <4/10 Outcome: Progressing   Problem: RH KNOWLEDGE DEFICIT Goal: RH STG INCREASE KNOWLEDGE OF STROKE PROPHYLAXIS Outcome: Progressing

## 2020-11-19 NOTE — CV Procedure (Deleted)
INDICATIONS: stroke  PROCEDURE:   Informed consent was obtained prior to the procedure. The risks, benefits and alternatives for the procedure were discussed and the patient comprehended these risks.  Risks include, but are not limited to, cough, sore throat, vomiting, nausea, somnolence, esophageal and stomach trauma or perforation, bleeding, low blood pressure, aspiration, pneumonia, infection, trauma to the teeth and death.    After a procedural time-out, the oropharynx was anesthetized with 20% benzocaine spray.   During this procedure the patient was administered propofol per anesthesia.  The patient's heart rate, blood pressure, and oxygen saturation were monitored continuously during the procedure. The period of conscious sedation was 30 minutes, of which I was present face-to-face 100% of this time.  The transesophageal probe was inserted in the esophagus and stomach without difficulty and multiple views were obtained.  The patient was kept under observation until the patient left the procedure room.  The patient left the procedure room in stable condition.   Agitated microbubble saline contrast was administered.  COMPLICATIONS:    There were no immediate complications.  FINDINGS:   FORMAL ECHOCARDIOGRAM REPORT PENDING  1. Evidence of atrial level shunting detected by color flow Doppler.  Agitated saline contrast bubble study was negative, could not demonstrate  interatrial shunt. Probable PFO, though right to left shunting could not  be provoked on this exam.  2. Left ventricular ejection fraction, by estimation, is 55 to 60%. The  left ventricle has normal function.  3. Right ventricular systolic function is normal. The right ventricular  size is normal.  4. No left atrial/left atrial appendage thrombus was detected.  5. The mitral valve is normal in structure. Trivial mitral valve  regurgitation. No evidence of mitral stenosis.  6. The aortic valve is tricuspid.  Aortic valve regurgitation is not  visualized.   RECOMMENDATIONS:    Return to hospital room when alert.   Time Spent Directly with the Patient:   45 minutes   [place] 11/19/2020, 11:04 AM

## 2020-11-19 NOTE — Progress Notes (Signed)
Inpatient [facility] Individual Statement of Services  Patient Name:  Dana Henry  Date:  11/19/2020  Welcome to the [facility].  Our goal is to provide you with an individualized program based on your diagnosis and situation, designed to meet your specific needs.  With this comprehensive rehabilitation program, you will be expected to participate in at least 3 hours of rehabilitation therapies Monday-Friday, with modified therapy programming on the weekends.  Your rehabilitation program will include the following services:  Physical Therapy (PT), Occupational Therapy (OT), Speech Therapy (ST), 24 hour per day rehabilitation nursing, Therapeutic Recreaction (TR), Neuropsychology, Care Coordinator, Rehabilitation Medicine, Nutrition Services, Pharmacy Services and Other  Weekly team conferences will be held on Tuesday to discuss your progress.  Your Inpatient Rehabilitation Care Coordinator will talk with you frequently to get your input and to update you on team discussions.  Team conferences with you and your family in attendance may also be held.  Expected length of stay: 13-16 Days  Overall anticipated outcome: Supervision/Min A  Depending on your progress and recovery, your program may change. Your Inpatient Rehabilitation Care Coordinator will coordinate services and will keep you informed of any changes. Your Inpatient Rehabilitation Care Coordinator's name and contact numbers are listed  below.  The following services may also be recommended but are not provided by the [facility]:    [place] will be made to provide these services after discharge if needed.  Arrangements include referral to agencies that provide these services.  Your insurance has been verified to be:  [employer] Your primary doctor is:  Lamar Blinks, MD  Pertinent information  will be shared with your doctor and your insurance company.  Inpatient Rehabilitation Care Coordinator:  Ovidio Kin, Marlinda Mike [phone] or Emilia Beck  Information discussed with and copy given to patient by: Dyanne Iha, 11/19/2020, 11:32 AM

## 2020-11-19 NOTE — Progress Notes (Signed)
Inpatient Rehabilitation  Patient information reviewed and entered into eRehab system by Cameo Shewell M. Stavros Cail, M.A., CCC/SLP, PPS Coordinator.  Information including medical coding, functional ability and quality indicators will be reviewed and updated through discharge.    

## 2020-11-19 NOTE — Progress Notes (Signed)
[facility] PHYSICAL MEDICINE & REHABILITATION PROGRESS NOTE  Subjective/Complaints: Patient seen laying in bed this morning.  She states she slept well overnight.  Required sleep aid overnight.  She states she is ready begin therapies.  She notes constipation.  ROS: + Constipation. Denies CP, SOB, N/V/D  Objective: Vital Signs: Blood pressure 105/69, pulse 75, temperature 98.2 F (36.8 C), resp. rate 17, last menstrual period 11/05/2020, SpO2 98 %, currently breastfeeding. ECHO TEE  Result Date: 11/18/2020    TRANSESOPHOGEAL ECHO REPORT   Patient Name:   Dana Henry Date of Exam: 11/18/2020 Medical Rec #:  [id number]      Height:       64.0 in Accession #:    [id number]     Weight:       180.0 lb Date of Birth:  July 12, 1981     BSA:          1.871 m Patient Age:    40 years       BP:           90/66 mmHg Patient Gender: F              HR:           86 bpm. Exam Location:  Inpatient Procedure: Transesophageal Echo, 3D Echo, Color Doppler, Cardiac Doppler and            Saline Contrast Bubble Study Indications:     Stroke i163.9  History:         Patient has prior history of Echocardiogram examinations, most                  recent 11/14/2020.  Sonographer:     Raquel Sarna Senior RDCS Referring Phys:  [id number] Leanor Kail Diagnosing Phys: Cherlynn Kaiser MD PROCEDURE: After discussion of the risks and benefits of a TEE, an informed consent was obtained from the patient. TEE procedure time was 19 minutes. The transesophogeal probe was passed without difficulty through the esophogus of the patient. Imaged were obtained with the patient in a left lateral decubitus position. Local oropharyngeal anesthetic was provided with Cetacaine. Sedation performed by different physician. The patient was monitored while under deep sedation. Anesthestetic sedation was provided intravenously by Anesthesiology: 358mg  of Propofol, 80mg  of Lidocaine. Image quality was good. The patient's vital signs; including heart rate,  blood pressure, and oxygen saturation; remained stable throughout the procedure. The patient developed no complications during the procedure. IMPRESSIONS  1. Evidence of atrial level shunting detected by color flow Doppler. Agitated saline contrast bubble study was negative, could not demonstrate interatrial shunt. Probable PFO, though right to left shunting could not be provoked on this exam.  2. Left ventricular ejection fraction, by estimation, is 55 to 60%. The left ventricle has normal function.  3. Right ventricular systolic function is normal. The right ventricular size is normal.  4. No left atrial/left atrial appendage thrombus was detected.  5. The mitral valve is normal in structure. Trivial mitral valve regurgitation. No evidence of mitral stenosis.  6. The aortic valve is tricuspid. Aortic valve regurgitation is not visualized. FINDINGS  Left Ventricle: Left ventricular ejection fraction, by estimation, is 55 to 60%. The left ventricle has normal function. The left ventricular internal cavity size was normal in size. Right Ventricle: The right ventricular size is normal. No increase in right ventricular wall thickness. Right ventricular systolic function is normal. Left Atrium: Left atrial size was normal in size. No left atrial/left atrial appendage thrombus was detected. Right  Atrium: Right atrial size was normal in size. Pericardium: There is no evidence of pericardial effusion. Mitral Valve: The mitral valve is normal in structure. Trivial mitral valve regurgitation. No evidence of mitral valve stenosis. Tricuspid Valve: The tricuspid valve is normal in structure. Tricuspid valve regurgitation is trivial. No evidence of tricuspid stenosis. Aortic Valve: The aortic valve is tricuspid. Aortic valve regurgitation is not visualized. Pulmonic Valve: The pulmonic valve was grossly normal. Pulmonic valve regurgitation is trivial. Aorta: The aortic root and ascending aorta are structurally normal, with no  evidence of dilitation. IAS/Shunts: Evidence of atrial level shunting detected by color flow Doppler. Agitated saline contrast was given intravenously to evaluate for intracardiac shunting. Agitated saline contrast bubble study was negative, with no evidence of any interatrial shunt.   3D Volume EF LV 3D EDV:   95.89 ml LV 3D ESV:   48.33 ml AORTIC VALVE LVOT Vmax:   102.00 cm/s LVOT Vmean:  71.500 cm/s LVOT VTI:    0.195 m  SHUNTS Systemic VTI: 0.20 m Cherlynn Kaiser MD Electronically signed by Cherlynn Kaiser MD Signature Date/Time: 11/18/2020/12:44:09 PM    Final    Recent Labs    11/18/20 0154 11/19/20 0500  WBC 6.5 6.4  HGB 11.3* 11.8*  HCT 31.7* 34.9*  PLT 216 229   Recent Labs    11/18/20 0154 11/19/20 0500  NA 136 138  K 3.3* 3.6  CL 106 106  CO2 20* 21*  GLUCOSE 95 94  BUN 10 13  CREATININE 0.81 0.86  CALCIUM 9.0 9.1    Intake/Output Summary (Last 24 hours) at 11/19/2020 0825 Last data filed at 11/18/2020 1815 Gross per 24 hour  Intake 240 ml  Output --  Net 240 ml        Physical Exam: BP 105/69   Pulse 75   Temp 98.2 F (36.8 C)   Resp 17   LMP 11/05/2020   SpO2 98%  Constitutional: No distress . Vital signs reviewed. HENT: Normocephalic.  Atraumatic. Eyes: EOMI. No discharge. Cardiovascular: No JVD.  RRR. Respiratory: Normal effort.  No stridor.  Bilateral clear to auscultation. GI: Non-distended.  BS +. Skin: Warm and dry.  Intact. Psych: Normal mood.  Normal behavior. Musc: No edema in extremities.  No tenderness in extremities. Neuro: Alert Makes eye contact with examiner.  Follows commands.  Motor: LUE: 4 -/5 proximal distal LLE: 4/5 proximal to distal   Assessment/Plan: 1. Functional deficits which require 3+ hours per day of interdisciplinary therapy in a comprehensive inpatient rehab setting.  Physiatrist is providing close team supervision and 24 hour management of active medical problems listed below.  Physiatrist and rehab team continue  to assess barriers to discharge/monitor patient progress toward functional and medical goals   Care Tool:  Bathing              Bathing assist       Upper Body Dressing/Undressing Upper body dressing        Upper body assist      Lower Body Dressing/Undressing Lower body dressing            Lower body assist       Toileting Toileting    Toileting assist       Transfers Chair/bed transfer  Transfers assist           Locomotion Ambulation   Ambulation assist              Walk 10 feet activity   Assist  Walk 50 feet activity   Assist           Walk 150 feet activity   Assist           Walk 10 feet on uneven surface  activity   Assist           Wheelchair     Assist               Wheelchair 50 feet with 2 turns activity    Assist            Wheelchair 150 feet activity     Assist           Medical Problem List and Plan: 1.Left side hemiparesis secondary to acute ischemic right MCA infarction with emergent stenting angioplasty right ICA per interventional radiology  Begin CIR evaluations 2. Antithrombotics: -DVT/anticoagulation:Lovenox -antiplatelet therapy: Aspirin 81 mg daily and Brilinta 90 mg twice daily 3. Pain Management:Tylenol as needed 4. Mood:Provide emotional support -antipsychotic agents: N/A 5. Neuropsych: This patientiscapable of making decisions on herown behalf. 6. Skin/Wound Care:Routine skin checks 7. Fluids/Electrolytes/Nutrition:Routine in and outs  BMP within acceptable range on 2/1 8. Remote Covid infection 09/2020. SARS corona is negative on admission. 9. Remote tobacco abuse. Urine drug screen positive marijuana.  Counsel 10.Obesity. BMI 30.90.Dietery follow up 11.  Hypoalbuminemia  Supplement initiated on 2/1 12.  Slow transit constipation  Bowel meds increased on 2/1   LOS: 1 days A FACE TO FACE  EVALUATION WAS PERFORMED  Blayne Frankie Lorie Phenix 11/19/2020, 8:25 AM

## 2020-11-19 NOTE — Evaluation (Signed)
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 Anemia   . History of cesarean delivery affecting pregnancy 08/10/2017  . Kidney stones    surgically removed at 40YO and 40YO  . Migraines    associated with menstrual cycle  . UTI (urinary tract infection)   . Vaginal Pap smear, abnormal    Past Surgical History:  Past Surgical History:  Procedure Laterality Date  . BUBBLE STUDY  11/18/2020   Procedure: BUBBLE STUDY;  Surgeon: Elouise Munroe, MD;  Location: [facility];  Service: Cardiology;;  . CESAREAN SECTION N/A 07/21/2014   Procedure: CESAREAN SECTION;  Surgeon: Elveria Royals, MD;  Location: [facility];  Service: Obstetrics;  Laterality: N/A;  . CESAREAN SECTION N/A 08/10/2017   Procedure: Repeat CESAREAN SECTION;  Surgeon: Azucena Fallen, MD;  Location: [facility];  Service: Obstetrics;  Laterality: N/A;  EDD: 08/17/17 Allergy: Sulfa  . IR CT HEAD LTD  11/13/2020  . IR INTRA CRAN STENT  11/13/2020  . IR PERCUTANEOUS ART THROMBECTOMY/INFUSION INTRACRANIAL INC DIAG ANGIO  11/13/2020  . KIDNEY STONE SURGERY    . RADIOLOGY WITH ANESTHESIA N/A 11/13/2020   Procedure: IR WITH ANESTHESIA;  Surgeon: Radiologist, Medication, MD;  Location: [facility];  Service: Radiology;  Laterality: N/A;  . TEE WITHOUT CARDIOVERSION N/A 11/18/2020   Procedure: TRANSESOPHAGEAL ECHOCARDIOGRAM (TEE);  Surgeon: Elouise Munroe, MD;  Location: [facility];  Service: Cardiology;  Laterality: N/A;  . TONSILLECTOMY AND ADENOIDECTOMY  1991    Assessment / Plan / Recommendation Clinical Impression Dana Henry is a 40 year old right-handed female history of migraine headaches, Covid infection 09/2020 and remote tobacco abuse. Patient lives with spouse and 2 young daughters. Independent prior to admission. Two-level home bed and bath on main level. Presented 11/13/2020 with acute onset of left-sided weakness. She was noted to have a low-grade fever. Admission chemistries unremarkable except creatinine 1.06, WBC 8000, hemoglobin 14, SARS coronavirus negative, urine drug screen positive marijuana. Cranial CT scan showed no acute intracranial hemorrhage. Acute infarct involving the right caudate, adjacent white matter and superior lentiform nucleus. Patient did not receive TPA. 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 Remainder of proximal intracranial vessels were patent. No occlusion or hemodynamically significant stenosis in the neck. Patient underwent cerebral arteriogram with emergent stent assisted angioplasty of the right ICA per interventional radiology. Echocardiogram with ejection fraction of 60 to 65%. No wall motion abnormalities. 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 No associated hemorrhage or mass-effect. Patient currently maintained on aspirin as well as Brilinta for CVA prophylaxis. Subcutaneous Lovenox for DVT prophylaxis. Initially on Cleviprex for blood pressure control. Plan for TEE 1/31/2022showed no hemodynamically significant valvular heart disease. No LA appendage or thrombus. No LV apical thrombus.. Due to patient decreased functional ability left side weakness she was admitted for a comprehensive  rehab  program.  Pt presents with very mild impairments in executive function, higher level attention and emergent/anticipatory awareness. Pt scored WFL on all CLQT subsections, however subsections of executive function and attention were near borderline. Pt was working full time and has two children; an active life. Therefore would benefit from [place] focusing on higher level skills such as; divided attention, executive function and awareness. Pt demonstrated WF oral motor skills and consumed regular textures and thin liquids via straw with no dysphagia noted. Pt supports no changes in swallow or speech function, only noting feelings of "slowness" in cognitive skills. Pt would benefit from skilled ST services in order to maximize functional independence and reduce burden of care, no further ST services anticipated at discharge.    Skilled Therapeutic Interventions          Skilled ST services focused on cognitive skills. SLP facilitated administration of cognitive linguistic formal assessment and provided education of results. SLP and pt collaborated to set goals for cognitive linguistic needs during length of stay. All questions answered to satisfaction.  Pt was left in room with call bell within reach and bed alarm set. SLP recommends to continue skilled services.  SLP Assessment  Patient will need skilled Speech Lanaguage Pathology Services during CIR admission    Recommendations  SLP Diet Recommendations: Thin;Age appropriate regular solids Liquid Administration via: Straw;Cup Medication Administration: Whole meds with liquid Supervision: Patient able to self feed Postural Changes and/or Swallow Maneuvers: Seated upright 90 degrees Oral Care Recommendations: Oral care BID Patient destination: Home Follow up Recommendations: None Equipment Recommended: None recommended by SLP    SLP Frequency 3 to 5 out of 7 days   SLP Duration  SLP Intensity  SLP Treatment/Interventions 7-10  Minumum of  1-2 x/day, 30 to 90 minutes  Cueing hierarchy;Functional tasks;Cognitive remediation/compensation    Pain Pain Assessment Pain Scale: 0-10 Pain Score: 0-No pain  Prior Functioning Cognitive/Linguistic Baseline: Within functional limits Type of Home: House  Lives With: Spouse Available Help at Discharge: Family;Available 24 hours/day Vocation: Full time employment  SLP Evaluation Cognition Overall Cognitive Status: Impaired/Different from baseline Orientation Level: Oriented X4 Attention: Divided;Alternating Alternating Attention: Appears intact Divided Attention: Impaired Memory: Appears intact Awareness: Impaired Awareness Impairment: Anticipatory impairment;Emergent impairment Problem Solving: Impaired Problem Solving Impairment: Verbal complex;Functional complex Safety/Judgment: Appears intact  Comprehension Auditory Comprehension Overall Auditory Comprehension: Appears within functional limits for tasks assessed Expression Expression Primary Mode of Expression: Verbal Verbal Expression Overall Verbal Expression: Appears within functional limits for tasks assessed Written Expression Dominant Hand: Right Oral Motor Oral Motor/Sensory Function Overall Oral Motor/Sensory Function: Within functional limits Motor Speech Overall Motor Speech: Appears within functional limits for tasks assessed Respiration: Within functional limits Phonation: Normal Resonance: Within functional limits Articulation: Within functional limitis Intelligibility: Intelligible  Care Tool Care Tool Cognition Expression of Ideas and Wants Expression of Ideas and Wants: Without difficulty (complex and basic) - expresses complex messages without difficulty and with speech that is clear and easy to understand   Understanding Verbal and Non-Verbal Content Understanding Verbal and Non-Verbal Content: Understands (complex and basic) - clear comprehension without cues or repetitions    Memory/Recall Ability *first 3 days only Memory/Recall Ability *first 3 days only: Current season;Staff names and faces;Location of own room;That he or she is in a hospital/hospital unit     PMSV Assessment  PMSV Trial Intelligibility: Intelligible  Bedside Swallowing Assessment General Diet Prior to this Study: Regular (dys 3 till yesterday transfering to CIR) Behavior/Cognition: Alert;Cooperative;Pleasant mood Oral  Cavity - Dentition: Adequate natural dentition Self-Feeding Abilities: Able to feed self Patient Positioning: Upright in bed Baseline Vocal Quality: Normal Volitional Cough: Strong Volitional Swallow: Able to elicit  Oral Care Assessment Does patient have any of the following "high(er) risk" factors?: None of the above Does patient have any of the following "at risk" factors?: None of the above Patient is LOW RISK: Follow universal precautions (see row information) Ice Chips Ice chips: Not tested Thin Liquid Thin Liquid: Within functional limits Presentation: Straw;Self Fed Nectar Thick Nectar Thick Liquid: Not tested Honey Thick Honey Thick Liquid: Not tested Puree Puree: Not tested Solid Solid: Within functional limits Presentation: Self Fed BSE Assessment Risk for Aspiration Impact on safety and function: No limitations  Short Term Goals: Week 1: SLP Short Term Goal 1 (Week 1): STG=LTG due to short ELOS  Refer to Care Plan for Long Term Goals  Recommendations for other services: None   Discharge Criteria: Patient will be discharged from SLP if patient refuses treatment 3 consecutive times without medical reason, if treatment goals not met, if there is a change in medical status, if patient makes no progress towards goals or if patient is discharged from hospital.  The above assessment, treatment plan, treatment alternatives and goals were discussed and mutually agreed upon: by patient    Jonte Shiller  [facility] 11/19/2020, 12:52 PM

## 2020-11-19 NOTE — Evaluation (Signed)
Physical Therapy Assessment and Plan  Patient Details  Name: Dana Henry MRN: [id number] Date of Birth: 1981/03/31  PT Diagnosis: Abnormality of gait, Difficulty walking, Hemiparesis non-dominant, Impaired cognition and Muscle weakness Rehab Potential: Excellent ELOS: 7-10 days   Today's Date: 11/19/2020 PT Individual Time: 1300-1415 PT Individual Time Calculation (min): 75 min    Hospital Problem: Principal Problem:   Right middle cerebral artery stroke ([place]) Active Problems:   Slow transit constipation   Hypoalbuminemia due to protein-calorie malnutrition ([place])   Marijuana abuse   Left hemiparesis ([place])   Past Medical History:  Past Medical History:  Diagnosis Date  . Anemia   . History of cesarean delivery affecting pregnancy 08/10/2017  . Kidney stones    surgically removed at 40YO and 40YO  . Migraines    associated with menstrual cycle  . UTI (urinary tract infection)   . Vaginal Pap smear, abnormal    Past Surgical History:  Past Surgical History:  Procedure Laterality Date  . BUBBLE STUDY  11/18/2020   Procedure: BUBBLE STUDY;  Surgeon: Elouise Munroe, MD;  Location: [facility];  Service: Cardiology;;  . CESAREAN SECTION N/A 07/21/2014   Procedure: CESAREAN SECTION;  Surgeon: Elveria Royals, MD;  Location: [facility];  Service: Obstetrics;  Laterality: N/A;  . CESAREAN SECTION N/A 08/10/2017   Procedure: Repeat CESAREAN SECTION;  Surgeon: Azucena Fallen, MD;  Location: [facility];  Service: Obstetrics;  Laterality: N/A;  EDD: 08/17/17 Allergy: Sulfa  . IR CT HEAD LTD  11/13/2020  . IR INTRA CRAN STENT  11/13/2020  . IR PERCUTANEOUS ART THROMBECTOMY/INFUSION INTRACRANIAL INC DIAG ANGIO  11/13/2020  . KIDNEY STONE SURGERY    . RADIOLOGY WITH ANESTHESIA N/A 11/13/2020   Procedure: IR WITH ANESTHESIA;  Surgeon: Radiologist, Medication, MD;  Location: [facility];  Service: Radiology;  Laterality: N/A;  . TEE WITHOUT CARDIOVERSION N/A 11/18/2020   Procedure:  TRANSESOPHAGEAL ECHOCARDIOGRAM (TEE);  Surgeon: Elouise Munroe, MD;  Location: [facility];  Service: Cardiology;  Laterality: N/A;  . TONSILLECTOMY AND ADENOIDECTOMY  1991    Assessment & Plan Clinical Impression: Patient is a 40 year old right-handed female history of migraine headaches, Covid infection 09/2020 and remote tobacco abuse. Patient lives with spouse and 2 young daughters. Independent prior to admission. Two-level home bed and bath on main level. Presented 11/13/2020 with acute onset of left-sided weakness. She was noted to have a low-grade fever. Admission chemistries unremarkable except creatinine 1.06, WBC 8000, hemoglobin 14, SARS coronavirus negative, urine drug screen positive marijuana. Cranial CT scan showed no acute intracranial hemorrhage. Acute infarct involving the right caudate, adjacent white matter and superior lentiform nucleus. Patient did not receive TPA. 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 Remainder of proximal intracranial vessels were patent. No occlusion or hemodynamically significant stenosis in the neck. Patient underwent cerebral arteriogram with emergent stent assisted angioplasty of the right ICA per interventional radiology. Echocardiogram with ejection fraction of 60 to 65%. No wall motion abnormalities. 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 No associated hemorrhage or mass-effect. Patient currently maintained on aspirin as well as Brilinta for CVA prophylaxis. Subcutaneous Lovenox for DVT prophylaxis. Initially on Cleviprex for blood pressure control. Plan for TEE 1/31/2022showed no hemodynamically significant valvular heart disease. No LA appendage or thrombus. No LV apical thrombus.. Due to patient decreased functional ability left  side weakness she was admitted for a comprehensive rehab program. Patient transferred to CIR on 11/18/2020 .   Patient currently requires min with mobility secondary to muscle weakness, decreased cardiorespiratoy endurance, unbalanced muscle activation, decreased problem solving and delayed processing and decreased standing balance and hemiplegia.  Prior to hospitalization, patient was independent  with mobility and lived with Family,Spouse,Other (Comment) (Mother lives with her) in a House home.  Home access is 1-2Stairs to enter.  Patient will benefit from skilled PT intervention to maximize safe functional mobility and minimize fall risk for planned discharge home with intermittent assist.  Anticipate patient will benefit from follow up OP at discharge.  PT - End of Session Activity Tolerance: Tolerates 30+ min activity without fatigue Endurance Deficit: Yes Endurance Deficit Description: Pt benefits from intermittent seated RBs throughout self care PT Assessment Rehab Potential (ACUTE/IP ONLY): Excellent PT Barriers to Discharge: Insurance for SNF coverage;Home environment access/layout PT Patient demonstrates impairments in the following area(s): Balance;Endurance;Motor PT Transfers Functional Problem(s): Bed to Chair;Car;Furniture;Floor PT Locomotion Functional Problem(s): Ambulation;Stairs PT Plan PT Intensity: Minimum of 1-2 x/day ,45 to 90 minutes PT Frequency: 5 out of 7 days PT Duration Estimated Length of Stay: 7-10 days PT Treatment/Interventions: Ambulation/gait training;Cognitive remediation/compensation;Balance/vestibular training;Community reintegration;Discharge planning;DME/adaptive equipment instruction;Disease management/prevention;Neuromuscular re-education;Functional mobility training;Functional electrical stimulation;Pain management;Patient/family education;Psychosocial support;Skin care/wound management;Splinting/orthotics;Stair training;Therapeutic Activities;Therapeutic  Exercise;UE/LE Strength taining/ROM;UE/LE Coordination activities;Visual/perceptual remediation/compensation;Wheelchair propulsion/positioning PT Transfers Anticipated Outcome(s): mod I PT Locomotion Anticipated Outcome(s): mod I PT Recommendation Recommendations for Other Services: Neuropsych consult Follow Up Recommendations: Outpatient PT Patient destination: Home Equipment Recommended: To be determined Equipment Details: Pt owns no DME   PT Evaluation Precautions/Restrictions Precautions Precautions: Fall Precaution Comments: left sided weakness (UE>LE) Restrictions Weight Bearing Restrictions: No General Chart Reviewed: Yes Additional Pertinent History: migraine headaches, Covid infection 09/2020 and remote tobacco abuse Family/Caregiver Present: No Vital SignsTherapy Vitals Temp: (!) 97.4 F (36.3 C) Temp Source: Oral Pulse Rate: 74 Resp: 15 BP: (!) 102/51 Patient Position (if appropriate): Lying Oxygen Therapy SpO2: 99 % O2 Device: Room Air Pain Pain Assessment Pain Scale: 0-10 Pain Score: 0-No pain Home Living/Prior Functioning Home Living Available Help at Discharge: Family;Available 24 hours/day Type of Home: House Home Access: Stairs to enter [employer] of Steps: 1-2 Entrance Stairs-Rails: None Home Layout: Two level;Bed/bath upstairs;Able to live on main level with bedroom/bathroom Alternate Level Stairs-Number of Steps: 7 + 7 (landing seperating steps) Alternate Level Stairs-Rails: Right Bathroom Shower/Tub: Walk-in shower;Tub/shower unit (walk-in shower on 2nd floor, tub-shower on 1st floor) Bathroom Toilet: Standard Additional Comments: Had just moved into new home pta  Lives With: Family;Spouse;Other (Comment) (Mother lives with her) Prior Function Level of Independence: Independent with basic ADLs;Independent with gait;Independent with homemaking with ambulation;Independent with transfers  Able to Take Stairs?: Yes Driving:  Yes Vocation: Full time employment 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 Vision/Perception  Vision - Risk analyst: Within Functional Limits Ocular Range of Motion: Within Functional Limits Tracking/Visual Pursuits: Able to track stimulus in all quads without difficulty Saccades: Within functional limits Perception Perception: Within Functional Limits Praxis Praxis: Intact  Cognition Overall Cognitive Status: Impaired/Different from baseline Arousal/Alertness: Awake/alert Orientation Level: Oriented X4 Attention: Divided;Alternating Alternating Attention: Appears intact Divided Attention: Impaired Divided Attention Impairment: Functional basic;Functional complex Memory: Appears intact Immediate Memory Recall: Sock;Blue;Bed Memory Recall Sock: With Cue Memory Recall Blue: Without Cue Memory Recall Bed: Without Cue Awareness: Appears intact Awareness Impairment: Anticipatory impairment;Emergent impairment Problem Solving: Appears intact Problem Solving Impairment: Verbal complex;Functional  complex Safety/Judgment: Appears intact Sensation Sensation Light Touch: Appears Intact Hot/Cold: Appears Intact Proprioception: Appears Intact Stereognosis: Appears Intact Coordination Gross Motor Movements are Fluid and Coordinated: Yes Fine Motor Movements are Fluid and Coordinated: No Finger Nose Finger Test: slow and labored with decreased precision LUE; WNL RUE Motor  Motor Motor: Hemiplegia Motor - Skilled Clinical Observations: Left sided hemi (UE>LE weakness)   Trunk/Postural Assessment  Cervical Assessment Cervical Assessment: Within Functional Limits Thoracic Assessment Thoracic Assessment: Within Functional Limits Lumbar Assessment Lumbar Assessment: Within Functional Limits Postural Control Postural Control: Within Functional Limits  Balance Balance Balance Assessed: Yes Standardized Balance  Assessment Standardized Balance Assessment: Berg Balance Test Berg Balance Test Sit to Stand: Able to stand without using hands and stabilize independently Standing Unsupported: Able to stand safely 2 minutes Sitting with Back Unsupported but Feet Supported on Floor or Stool: Able to sit safely and securely 2 minutes Stand to Sit: Sits safely with minimal use of hands Transfers: Able to transfer safely, minor use of hands Standing Unsupported with Eyes Closed: Able to stand 10 seconds with supervision Standing Ubsupported with Feet Together: Able to place feet together independently and stand for 1 minute with supervision From Standing, Reach Forward with Outstretched Arm: Can reach confidently >25 cm (10") From Standing Position, Pick up Object from Floor: Able to pick up shoe safely and easily From Standing Position, Turn to Look Behind Over each Shoulder: Looks behind one side only/other side shows less weight shift Turn 360 Degrees: Able to turn 360 degrees safely one side only in 4 seconds or less Standing Unsupported, Alternately Place Feet on Step/Stool: Needs assistance to keep from falling or unable to try Standing Unsupported, One Foot in Front: Able to plae foot ahead of the other independently and hold 30 seconds Standing on One Leg: Able to lift leg independently and hold 5-10 seconds Total Score: 46 Static Sitting Balance Static Sitting - Balance Support: No upper extremity supported Static Sitting - Level of Assistance: 7: Independent Dynamic Sitting Balance Dynamic Sitting - Balance Support: No upper extremity supported Dynamic Sitting - Level of Assistance: 5: Stand by assistance Static Standing Balance Static Standing - Balance Support: Bilateral upper extremity supported Static Standing - Level of Assistance: 5: Stand by assistance Dynamic Standing Balance Dynamic Standing - Balance Support: During functional activity;Right upper extremity supported Dynamic Standing -  Level of Assistance: 5: Stand by assistance Extremity Assessment  RUE Assessment RUE Assessment: Within Functional Limits LUE Assessment LUE Assessment: Exceptions to [facility] Passive Range of Motion (PROM) Comments: WFL Active Range of Motion (AROM) Comments: Shoulder very limited FF and abduction; full AROM all other joints General Strength Comments: Shoulder: FF/ABD 2-/5; ER: 3/5 IR 3+/5 Ext 3+/5; elbow flex 3+/5 ext 3+/5, FA pro/sup 3/5; wrist ext 3+/5 flex 3-/5; digital flex/ext 3/5 RLE Assessment RLE Assessment: Within Functional Limits LLE Assessment LLE Assessment: Within Functional Limits General Strength Comments: Grossly 4+/5  Care Tool Care Tool Bed Mobility Roll left and right activity   Roll left and right assist level: Independent    Sit to lying activity   Sit to lying assist level: Independent    Lying to sitting edge of bed activity   Lying to sitting edge of bed assist level: Independent     Care Tool Transfers Sit to stand transfer   Sit to stand assist level: Contact Guard/Touching assist    Chair/bed transfer   Chair/bed transfer assist level: Contact Guard/Touching assist     Toilet transfer   Assist  Level: Contact Guard/Touching Gaffer transfer assist level: Contact Guard/Touching assist      Care Tool Locomotion Ambulation   Assist level: Contact Guard/Touching assist Assistive device: No Device Max distance: 250f  Walk 10 feet activity   Assist level: Contact Guard/Touching assist Assistive device: No Device   Walk 50 feet with 2 turns activity   Assist level: Contact Guard/Touching assist Assistive device: No Device  Walk 150 feet activity   Assist level: Contact Guard/Touching assist Assistive device: No Device  Walk 10 feet on uneven surfaces activity   Assist level: Contact Guard/Touching assist Assistive device: Other (comment) (no device)  Stairs   Assist level: Contact Guard/Touching assist Stairs  assistive device: 1 hand rail Max number of stairs: 12  Walk up/down 1 step activity   Walk up/down 1 step (curb) assist level: Contact Guard/Touching assist Walk up/down 1 step or curb assistive device: 1 hand rail    Walk up/down 4 steps activity Walk up/down 4 steps assist level: Contact Guard/Touching assist Walk up/down 4 steps assistive device: 1 hand rail  Walk up/down 12 steps activity   Walk up/down 12 steps assist level: Contact Guard/Touching assist Walk up/down 12 steps assistive device: 1 hand rail  Pick up small objects from floor   Pick up small object from the floor assist level: Supervision/Verbal cueing    Wheelchair Will patient use wheelchair at discharge?: No          Wheel 50 feet with 2 turns activity      Wheel 150 feet activity        Refer to Care Plan for Long Term Goals  SHORT TERM GOAL WEEK 1 PT Short Term Goal 1 (Week 1): STG = LTG due to ELOS  Recommendations for other services: Neuropsych  Skilled Therapeutic Intervention Mobility Bed Mobility Bed Mobility: Rolling Right;Rolling Left;Supine to Sit;Sit to Supine Rolling Right: Independent Rolling Left: Independent Supine to Sit: Independent Sit to Supine: Independent Transfers Transfers: Sit to Stand;Stand to Sit;Stand Pivot Transfers Sit to Stand: Contact Guard/Touching assist Stand to Sit: Contact Guard/Touching assist Stand Pivot Transfers: Contact Guard/Touching assist Transfer (Assistive device): None Locomotion  Gait Ambulation: Yes Gait Assistance: Contact Guard/Touching assist Gait Distance (Feet): 200 Feet Assistive device: None Gait Gait: Yes Gait Pattern: Impaired Gait Pattern: Step-through pattern;Decreased step length - left;Decreased step length - right;Decreased stance time - left;Decreased hip/knee flexion - left;Wide base of support Gait velocity: decr Stairs / Additional Locomotion Stairs: Yes Stairs Assistance: Contact Guard/Touching assist Stair Management  Technique: One rail Right;Alternating pattern;Step to pattern (alternating for ascent, step-to for descent) Number of Stairs: 12 Height of Stairs: 6 Ramp: Contact Guard/touching assist Wheelchair Mobility Wheelchair Mobility: No  Skilled Intervention:  Pt received supine in bed, awake and agreeable to therapy. No reports of pain. Pleasant throughout session, oriented x4. Initiated functional mobility as outlined above and completed BERG as described above. Pt able to complete bed mobility indep, transfers with CGA and no AD, and ambulated ~2069fwith CGA and no AD in hallways. Able to perform 12 stairs with CGA and 1 hand rail. Ended session supine in bed with bed alarm on and needs within reach.  Patient demonstrates increased fall risk as noted by score of   46/56 on Berg Balance Scale.  (<36= high risk for falls, close to 100%; 37-45 significant >80%; 46-51 moderate >50%; 52-55 lower >25%)  Instructed pt in results of PT evaluation as detailed above, PT POC, rehab potential,  rehab goals, and discharge recommendations. Additionally discussed CIR's policies regarding fall safety and use of chair alarm and/or quick release belt. Pt verbalized understanding and in agreement. Will update pt's family members as they become available.   Discharge Criteria: Patient will be discharged from PT if patient refuses treatment 3 consecutive times without medical reason, if treatment goals not met, if there is a change in medical status, if patient makes no progress towards goals or if patient is discharged from hospital.  The above assessment, treatment plan, treatment alternatives and goals were discussed and mutually agreed upon: by patient  Alger Simons PT, DPT 11/19/2020, 3:32 PM

## 2020-11-19 NOTE — Evaluation (Signed)
Occupational Therapy Assessment and Plan  Patient Details  Name: Dana Henry MRN: [id number] Date of Birth: June 21, 1981  OT Diagnosis: hemiplegia affecting non-dominant side, muscle weakness (generalized) and unsteadiness on feet Rehab Potential: Rehab Potential (ACUTE ONLY): Excellent ELOS: 7-10 days   Today's Date: 11/19/2020 OT Individual Time: 1100-1210 OT Individual Time Calculation (min): 70 min     Hospital Problem: Principal Problem:   Right middle cerebral artery stroke ([place]) Active Problems:   Slow transit constipation   Hypoalbuminemia due to protein-calorie malnutrition (HCC)   Marijuana abuse   Left hemiparesis ([place])   Past Medical History:  Past Medical History:  Diagnosis Date  . Anemia   . History of cesarean delivery affecting pregnancy 08/10/2017  . Kidney stones    surgically removed at 40YO and 40YO  . Migraines    associated with menstrual cycle  . UTI (urinary tract infection)   . Vaginal Pap smear, abnormal    Past Surgical History:  Past Surgical History:  Procedure Laterality Date  . BUBBLE STUDY  11/18/2020   Procedure: BUBBLE STUDY;  Surgeon: Elouise Munroe, MD;  Location: [facility];  Service: Cardiology;;  . CESAREAN SECTION N/A 07/21/2014   Procedure: CESAREAN SECTION;  Surgeon: Elveria Royals, MD;  Location: [facility];  Service: Obstetrics;  Laterality: N/A;  . CESAREAN SECTION N/A 08/10/2017   Procedure: Repeat CESAREAN SECTION;  Surgeon: Azucena Fallen, MD;  Location: [facility];  Service: Obstetrics;  Laterality: N/A;  EDD: 08/17/17 Allergy: Sulfa  . IR CT HEAD LTD  11/13/2020  . IR INTRA CRAN STENT  11/13/2020  . IR PERCUTANEOUS ART THROMBECTOMY/INFUSION INTRACRANIAL INC DIAG ANGIO  11/13/2020  . KIDNEY STONE SURGERY    . RADIOLOGY WITH ANESTHESIA N/A 11/13/2020   Procedure: IR WITH ANESTHESIA;  Surgeon: Radiologist, Medication, MD;  Location: [facility];  Service: Radiology;  Laterality: N/A;  . TEE WITHOUT CARDIOVERSION N/A  11/18/2020   Procedure: TRANSESOPHAGEAL ECHOCARDIOGRAM (TEE);  Surgeon: Elouise Munroe, MD;  Location: [facility];  Service: Cardiology;  Laterality: N/A;  . TONSILLECTOMY AND ADENOIDECTOMY  1991    Assessment & Plan Clinical Impression: Wipperfurth is a 40 year old right-handed female history of migraine headaches, Covid infection 09/2020 and remote tobacco abuse. Patient lives with spouse and 2 young daughters. Independent prior to admission. Two-level home bed and bath on main level. Presented 11/13/2020 with acute onset of left-sided weakness. She was noted to have a low-grade fever. Admission chemistries unremarkable except creatinine 1.06, WBC 8000, hemoglobin 14, SARS coronavirus negative, urine drug screen positive marijuana. Cranial CT scan showed no acute intracranial hemorrhage. Acute infarct involving the right caudate, adjacent white matter and superior lentiform nucleus. Patient did not receive TPA. 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 Remainder of proximal intracranial vessels were patent. No occlusion or hemodynamically significant stenosis in the neck. Patient underwent cerebral arteriogram with emergent stent assisted angioplasty of the right ICA per interventional radiology. Echocardiogram with ejection fraction of 60 to 65%. No wall motion abnormalities. 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 No associated hemorrhage or mass-effect. Patient currently maintained on aspirin as well as Brilinta for CVA prophylaxis. Subcutaneous Lovenox for DVT prophylaxis. Initially on Cleviprex for blood pressure control. Plan for TEE 1/31/2022showed no hemodynamically significant valvular heart disease. No LA appendage or thrombus. No LV apical thrombus.  Patient transferred  to CIR on 11/18/2020 .    Patient currently requires min with basic self-care skills secondary to muscle weakness, decreased cardiorespiratoy endurance, decreased coordination and decreased motor planning, decreased attention, decreased awareness, decreased problem solving, decreased safety awareness, decreased memory and delayed processing and decreased sitting balance, decreased standing balance, hemiplegia and decreased balance strategies.  Prior to hospitalization, patient could complete ADLs and IADLs with independent .  Patient will benefit from skilled intervention to increase independence with basic self-care skills prior to discharge home with care partner.  Anticipate patient will require intermittent supervision and follow up outpatient.  OT - End of Session Activity Tolerance: Tolerates 30+ min activity with multiple rests Endurance Deficit: Yes Endurance Deficit Description: Pt benefits from intermittent seated RBs throughout self care OT Assessment Rehab Potential (ACUTE ONLY): Excellent OT Patient demonstrates impairments in the following area(s): Balance;Cognition;Endurance;Safety;Motor OT Basic ADL's Functional Problem(s): Grooming;Bathing;Dressing;Toileting OT Advanced ADL's Functional Problem(s): Simple Meal Preparation;Light Housekeeping;Laundry OT Transfers Functional Problem(s): Toilet;Tub/Shower OT Additional Impairment(s): Fuctional Use of Upper Extremity OT Plan OT Intensity: Minimum of 1-2 x/day, 45 to 90 minutes OT Frequency: 5 out of 7 days OT Duration/Estimated Length of Stay: 7-10 days OT Treatment/Interventions: Balance/vestibular training;Discharge planning;Functional electrical stimulation;Self Care/advanced ADL retraining;Therapeutic Activities;UE/LE Coordination activities;Cognitive remediation/compensation;Disease mangement/prevention;Functional mobility training;Patient/family education;Therapeutic Exercise;DME/adaptive equipment instruction;Neuromuscular  re-education;Psychosocial support;Splinting/orthotics;UE/LE Strength taining/ROM OT Basic Self-Care Anticipated Outcome(s): mod I OT Toileting Anticipated Outcome(s): mod I OT Bathroom Transfers Anticipated Outcome(s): mod I OT Recommendation Patient destination: Home Follow Up Recommendations: Outpatient OT Equipment Recommended: To be determined Equipment Details: Pt does not currently own any equipment   OT Evaluation Precautions/Restrictions  Precautions Precautions: Fall Precaution Comments: left sided weakness (UE>LE) Restrictions Weight Bearing Restrictions: No General Chart Reviewed: Yes Pain Pain Assessment Pain Scale: 0-10 Pain Score: 0-No pain Home Living/Prior Functioning Home Living Family/patient expects to be discharged to:: Private residence Living Arrangements: Spouse/significant other,Parent,Children Available Help at Discharge: Family,Available 24 hours/day Type of Home: House Home Access: Stairs to enter [employer] of Steps: 1-2 Entrance Stairs-Rails: None Home Layout: Two level,Bed/bath upstairs,Able to live on main level with bedroom/bathroom Alternate Level Stairs-Number of Steps: 7 + 7 (landing seperating steps) Alternate Level Stairs-Rails: Right Bathroom Shower/Tub: Walk-in shower,Tub/shower unit (walk-in shower on 2nd floor, tub-shower on 1st floor) Bathroom Toilet: Standard Additional Comments: Had just moved into new home pta  Lives With: Family,Spouse,Other (Comment) (Mother lives with her) Prior Function Level of Independence: Independent with basic ADLs,Independent with gait,Independent with homemaking with ambulation,Independent with transfers  Able to Take Stairs?: Yes Driving: Yes Vocation: Full time employment 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 Vision Baseline Vision/History: Wears glasses Wears Glasses: At all times Patient Visual Report: Blurring of  vision Vision Assessment?: Yes Eye Alignment: Within Functional Limits Ocular Range of Motion: Within Functional Limits Tracking/Visual Pursuits: Able to track stimulus in all quads without difficulty Saccades: Within functional limits Visual Fields: No apparent deficits Perception  Perception: Within Functional Limits Praxis Praxis: Intact Cognition Overall Cognitive Status: Impaired/Different from baseline Arousal/Alertness: Awake/alert Orientation Level: Person;Place;Situation Person: Oriented Place: Oriented Situation: Oriented Year: 2022 Month: February Day of Week: Correct Memory: Appears intact Immediate Memory Recall: Sock;Blue;Bed Memory Recall Sock: With Cue Memory Recall Blue: Without Cue Memory Recall Bed: Without Cue Attention: Divided;Alternating Divided Attention: Impaired Divided Attention Impairment: Functional basic;Functional complex Awareness: Appears intact Awareness Impairment: Anticipatory impairment;Emergent impairment Problem Solving: Appears intact Problem Solving Impairment: Verbal complex;Functional complex Safety/Judgment: Appears intact Sensation Sensation Light Touch: Appears Intact Hot/Cold: Appears Intact  Proprioception: Impaired by gross assessment Stereognosis: Not tested Coordination Gross Motor Movements are Fluid and Coordinated: Yes Fine Motor Movements are Fluid and Coordinated: No Finger Nose Finger Test: slow and labored with decreased precision LUE; WNL RUE Motor  Motor Motor: Hemiplegia Motor - Skilled Clinical Observations: Left sided hemi (UE>LE weakness)  Trunk/Postural Assessment  Cervical Assessment Cervical Assessment: Within Functional Limits Thoracic Assessment Thoracic Assessment: Within Functional Limits Lumbar Assessment Lumbar Assessment: Within Functional Limits Postural Control Postural Control: Within Functional Limits  Balance Balance Balance Assessed: Yes Static Sitting Balance Static Sitting -  Balance Support: No upper extremity supported Static Sitting - Level of Assistance: 7: Independent Dynamic Sitting Balance Dynamic Sitting - Balance Support: No upper extremity supported Dynamic Sitting - Level of Assistance: 5: Stand by assistance Static Standing Balance Static Standing - Balance Support: Bilateral upper extremity supported Static Standing - Level of Assistance: 5: Stand by assistance Dynamic Standing Balance Dynamic Standing - Balance Support: During functional activity;Right upper extremity supported Dynamic Standing - Level of Assistance: 5: Stand by assistance Extremity/Trunk Assessment RUE Assessment RUE Assessment: Within Functional Limits LUE Assessment LUE Assessment: Exceptions to [facility] Passive Range of Motion (PROM) Comments: WFL Active Range of Motion (AROM) Comments: Shoulder very limited FF and abduction; full AROM all other joints General Strength Comments: Shoulder: FF/ABD 2-/5; ER: 3/5 IR 3+/5 Ext 3+/5; elbow flex 3+/5 ext 3+/5, FA pro/sup 3/5; wrist ext 3+/5 flex 3-/5; digital flex/ext 3/5  Care Tool Care Tool Self Care Eating   Eating Assist Level: Independent    Oral Care    Oral Care Assist Level: Set up assist    Bathing   Body parts bathed by patient: Right arm;Left arm;Chest;Abdomen;Front perineal area;Buttocks;Right upper leg;Left upper leg;Right lower leg;Left lower leg;Face     Assist Level: Contact Guard/Touching assist    Upper Body Dressing(including orthotics)   What is the patient wearing?: Pull over shirt   Assist Level: Minimal Assistance - Patient > 75%    Lower Body Dressing (excluding footwear)   What is the patient wearing?: Pants Assist for lower body dressing: Contact Guard/Touching assist    Putting on/Taking off footwear   What is the patient wearing?: Non-skid slipper socks Assist for footwear: Supervision/Verbal cueing       Care Tool Toileting Toileting activity   Assist for toileting: Contact Guard/Touching  assist     Care Tool Bed Mobility Roll left and right activity        Sit to lying activity        Lying to sitting edge of bed activity         Care Tool Transfers Sit to stand transfer        Chair/bed transfer         Toilet transfer   Assist Level: Contact Guard/Touching assist     Care Tool Cognition Expression of Ideas and Wants Expression of Ideas and Wants: Without difficulty (complex and basic) - expresses complex messages without difficulty and with speech that is clear and easy to understand   Understanding Verbal and Non-Verbal Content Understanding Verbal and Non-Verbal Content: Understands (complex and basic) - clear comprehension without cues or repetitions   Memory/Recall Ability *first 3 days only Memory/Recall Ability *first 3 days only: Current season;Staff names and faces;Location of own room;That he or she is in a hospital/hospital unit    Refer to Care Plan for Long Term Goals  SHORT TERM GOAL WEEK 1 OT Short Term Goal 1 (Week 1): STGs=LTGs due to  ELOS  Recommendations for other services: None    Skilled Therapeutic Intervention ADL ADL Grooming: Setup Where Assessed-Grooming: Standing at sink Upper Body Bathing: Minimal assistance Where Assessed-Upper Body Bathing: Shower Lower Body Bathing: Contact guard Where Assessed-Lower Body Bathing: Shower Upper Body Dressing: Supervision/safety Where Assessed-Upper Body Dressing: Other (Comment) (sitting on tub bench) Lower Body Dressing: Contact guard Where Assessed-Lower Body Dressing: Other (Comment) (sitting/standing at tub bench) Walk-In Shower Transfer: Curator Method: Ambulating (grab bars and RW) Youth worker: Manufacturing systems engineer  Bed Mobility Bed Mobility: Rolling Right;Supine to Sit Rolling Right: Independent Supine to Sit: Independent Transfers Sit to Stand: Contact Guard/Touching assist Stand to Sit: Contact Guard/Touching assist    Skilled Intervention: Pt semi upright in bed, agreeable to OT session.  Initial evaluation completed, collaborated with pt regarding OT POC.  Pt completed self care and functional mobility per above levels of assist needed.  Pt returned to sitting EOB with CSW arriving at end of session, call bell in reach, bed alarm on.   Discharge Criteria: Patient will be discharged from OT if patient refuses treatment 3 consecutive times without medical reason, if treatment goals not met, if there is a change in medical status, if patient makes no progress towards goals or if patient is discharged from hospital.  The above assessment, treatment plan, treatment alternatives and goals were discussed and mutually agreed upon: by patient  Ezekiel Slocumb 11/19/2020, 1:21 PM

## 2020-11-19 NOTE — Progress Notes (Signed)
Chaplain responded to Spiritual Consult for delivery and explanation of HCPOA.  Chaplain found pt alert, bright eyed and sitting up in bed.  Chaplain explained document, advising that during this time of Covid we are unable to notarize documents.  Chaplain advised seeking a notary at personal bank or credit union or personal attorney upon discharge.  Chaplain offered additional support; pt denied having any further needs.  [place]

## 2020-11-20 DIAGNOSIS — E6609 Other obesity due to excess calories: Secondary | ICD-10-CM

## 2020-11-20 DIAGNOSIS — Z6830 Body mass index (BMI) 30.0-30.9, adult: Secondary | ICD-10-CM

## 2020-11-20 LAB — ANCA TITERS
Atypical P-ANCA titer: <1:20 {titer}
C-ANCA: <1:20 {titer}
P-ANCA: <1:20 {titer}

## 2020-11-20 MED ORDER — BLOOD PRESSURE CONTROL BOOK
Freq: Once | Status: AC
  Filled 2020-11-20: qty 1

## 2020-11-20 MED ORDER — SENNOSIDES-DOCUSATE SODIUM 8.6-50 MG PO TABS
2.0000 | ORAL_TABLET | Freq: Two times a day (BID) | ORAL | Status: DC
  Administered 2020-11-20 (×2): 2 via ORAL
  Filled 2020-11-20 (×4): qty 2

## 2020-11-20 MED ORDER — EXERCISE FOR HEART AND HEALTH BOOK
Freq: Once | Status: AC
  Filled 2020-11-20: qty 1

## 2020-11-20 NOTE — Progress Notes (Signed)
Occupational Therapy Session Note  Patient Details  Name: Jamison Yuhasz MRN: [id number] Date of Birth: May 05, 1981  Today's Date: 11/20/2020 OT Individual Time: 1000-1100 OT Individual Time Calculation (min): 60 min    Short Term Goals: Week 1:  OT Short Term Goal 1 (Week 1): STGs=LTGs due to ELOS  Skilled Therapeutic Interventions/Progress Updates:    Pt sitting up in recliner, finishing breakfast, no c/o pain, requesting to shower and change clothes.  Pt completed all functional mobility without AD at supervision level including sit<>stand to/from recliner, ambulation recliner<>bathroom, sit<>stand at tub bench.  Pt retrieved ADL items as well with supervision.  Educated pt on hemitechnique to doff shirt and pt able to return demonstrate with mod I. Doffed underwear and pants with distant supervision needing VCs to promote seated threading over feet.  Pt able to use LUE throughout bathing tasks including washing hair and RUE.  Pt donned shirt, underwear and pants with supervision. Pt initially donned underwear on backwards, however with increased time pt able to problem solve and self correct error without assist needed.  Pt seated in recliner with call bell in reach at end of session.  Therapy Documentation Precautions:  Precautions Precautions: Fall Precaution Comments: left sided weakness (UE>LE) Restrictions Weight Bearing Restrictions: No   Therapy/Group: Individual Therapy  Ezekiel Slocumb 11/20/2020, 12:27 PM

## 2020-11-20 NOTE — Patient Care Conference (Signed)
Inpatient RehabilitationTeam Conference and Plan of Care Update Date: 11/20/2020   Time: 11:34 AM    Patient Name: Dana Henry      Medical Record Number: [id number]  Date of Birth: [birth year] Sex: Female         Room/Bed: 4W02C/4W02C-01 Payor Info: Payor: [place] / Plan: BCBS COMM PPO / Product Type: *No Product type* /    Admit Date/Time:  11/18/2020  3:47 PM  Primary Diagnosis:  Right middle cerebral artery stroke [facility])  Hospital Problems: Principal Problem:   Right middle cerebral artery stroke ([place]) Active Problems:   Slow transit constipation   Hypoalbuminemia due to protein-calorie malnutrition ([facility])   Marijuana abuse   Left hemiparesis ([place])   Class 1 obesity due to excess calories with serious comorbidity and body mass index (BMI) of 30.0 to 30.9 in adult    Expected Discharge Date: Expected Discharge Date: 11/22/20  Team Members Present: Physician leading conference: Dr. Delice Lesch Care Coodinator Present: Dorien Chihuahua, RN, BSN, CRRN;Loralee Pacas, [place] Nurse Present: Suella Grove, RN PT Present: Ginnie Smart, PT OT Present: Leretha Pol, OT SLP Present: Nadara Mode, SLP PPS Coordinator present : Gunnar Fusi, Novella Olive, PT     Current Status/Progress Goal Weekly Team Focus  Bowel/Bladder   pt continent x2 LBM 1/26 pt on laxatives with active bowel sounds  Pt will remain continent and have a BM within 3 days  q2 toileting, laxatives given to pt, assess bowel sounds   Swallow/Nutrition/ Hydration             ADL's   min assist - CGA for self care; CGA- supervision functional mobility; limited LUE functional use due to weakness and impaired motor control  mod I for BADLs  Initial evaluation, self care training, neuro re-ed LUE, dynamic standing balance and functional transfer training   Mobility             Communication             Safety/Cognition/ Behavioral Observations  Supervison A, higher level only. scored  boarderline of WFL in executive function  Mod I  executive function/complex problem solving, divided attention and anticipatory awareness   Pain   pt denies pain  Pt pain level will remain 3 out of 10  assess q shift, give prn pain meds   Skin   red area near groin from previous TEE  Pt will not develop new skin breakdown  assess skin qshift, educate pt on prevention of skin breakdown, monitor groin area for discoloration or hematomas     Discharge Planning:  Plans to discharge home with mom, lives with spouse and children. Mom works for [employer], able to work remote, 13 steps to enter home   Team Discussion: Doing well. Anticipate no DME needs for discharge.  Patient on target to meet rehab goals: yes, supervision with ADLs  *See Care Plan and progress notes for long and short-term goals.   Revisions to Treatment Plan:   Teaching Needs: Transfers, toileting, medications, secondary risk management, etc.   Current Barriers to Discharge: None  Possible Resolutions to Barriers: Family education OP follow up    Medical Summary Current Status: Left side hemiparesis secondary to acute ischemic right MCA infarction with emergent stenting angioplasty right ICA per interventional radiology  Barriers to Discharge: Medical stability;Weight   Possible Resolutions to Barriers/Weekly Focus: Therapies, optimize bowel meds, counsel regarding illicit substances   Continued Need for Acute Rehabilitation Level of Care: The patient requires daily  medical management by a physician with specialized training in physical medicine and rehabilitation for the following reasons: Direction of a multidisciplinary physical rehabilitation program to maximize functional independence : Yes Medical management of patient stability for increased activity during participation in an intensive rehabilitation regime.: Yes Analysis of laboratory values and/or radiology reports with any subsequent need for medication  adjustment and/or medical intervention. : Yes   I attest that I was present, lead the team conference, and concur with the assessment and plan of the team.   Dorien Chihuahua B 11/20/2020, 1:05 PM

## 2020-11-20 NOTE — Discharge Summary (Addendum)
Physician Discharge Summary  Patient ID: Dana Henry MRN: [id number] DOB/AGE: [birth year] 40 y.o.  Admit date: 11/18/2020 Discharge date: 11/22/2020  Discharge Diagnoses:  Principal Problem:   Right middle cerebral artery stroke [facility]) Active Problems:   Slow transit constipation   Hypoalbuminemia due to protein-calorie malnutrition (HCC)   Marijuana abuse   Left hemiparesis (HCC)   Class 1 obesity due to excess calories with serious comorbidity and body mass index (BMI) of 30.0 to 30.9 in adult DVT prophylaxis Remote Covid infection Remote tobacco abuse Obesity  Discharged Condition: Stable  Significant Diagnostic Studies: CT ANGIO HEAD W OR WO CONTRAST  Addendum Date: 11/13/2020   ADDENDUM REPORT: 11/13/2020 11:16 ADDENDUM: Transcription error in the impression. Should state occlusion or high-grade stenosis right petrous ICA. Electronically Signed   By: [name] M.D.   On: 11/13/2020 11:16   Result Date: 11/13/2020 CLINICAL DATA:  Code stroke follow-up, noncontrast CT positive for acute stroke EXAM: CT ANGIOGRAPHY HEAD AND NECK CT PERFUSION BRAIN TECHNIQUE: Multidetector CT imaging of the head and neck was performed using the standard protocol during bolus administration of intravenous contrast. Multiplanar CT image reconstructions and MIPs were obtained to evaluate the vascular anatomy. Carotid stenosis measurements (when applicable) are obtained utilizing NASCET criteria, using the distal internal carotid diameter as the denominator. Multiphase CT imaging of the brain was performed following IV bolus contrast injection. Subsequent parametric perfusion maps were calculated using RAPID software. CONTRAST:  191mL OMNIPAQUE IOHEXOL 350 MG/ML SOLN COMPARISON:  None. FINDINGS: CTA NECK FINDINGS Aortic arch: Great vessel origins are patent. 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 Right carotid system: Patent. No measurable stenosis at the ICA  origin. Left carotid system: Patent. No measurable stenosis at the ICA origin Vertebral arteries: Patent. Left vertebral artery is dominant. No measurable stenosis or evidence of dissection. Skeleton: No significant osseous abnormality Other neck: No mass or adenopathy. Upper chest: No apical lung mass. Review of the MIP images confirms the above findings CTA HEAD FINDINGS Anterior circulation: There is loss of right ICA enhancement within the carotid canal with reconstitution distally. Cavernous and supraclinoid portions are patent. Intracranial left ICA is patent. Bilateral anterior and middle cerebral arteries are patent. Posterior circulation: Intracranial vertebral arteries are patent and become diminutive after PICA origins. Basilar artery is patent and small in caliber. This is due to bilateral fetal origin of posterior cerebral arteries. Venous sinuses: As permitted by contrast timing, patent. 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 Remainder of proximal intracranial vessels are patent. Perfusion imaging demonstrates no evidence of core infarction despite findings on noncontrast head CT. Calculated territory at risk of 242 mL throughout the right MCA territory as well as MCA/ACA and MCA/PCA watershed territories. No occlusion or hemodynamically significant stenosis in the neck. Electronically Signed: By: [name] M.D. On: 11/13/2020 11:05   CT HEAD WO CONTRAST  Result Date: 11/13/2020 CLINICAL DATA:  Stroke follow-up. Status post emergent stent assisted angioplasty of right ICA petrous segment stenosis. EXAM: CT HEAD WITHOUT CONTRAST TECHNIQUE: Contiguous axial images were obtained from the base of the skull through the vertex without  intravenous contrast. COMPARISON:  Head CT and CTA earlier today FINDINGS: Brain: Hypodensity is again seen in the right basal ganglia and adjacent deep white matter  corresponding to the previously described acute infarct. No significant infarct extension, new infarct, intracranial hemorrhage, mass, midline shift, or extra-axial fluid collection is identified. The ventricles and sulci are normal. Vascular: Interval right petrous ICA stenting. Skull: No fracture or suspicious osseous lesion. Sinuses/Orbits: Mild right maxillary sinus mucosal thickening. Clear mastoid air cells. Unremarkable orbits. Other: None. IMPRESSION: 1. Unchanged acute right basal ganglia infarct. 2. No evidence of hemorrhage or other new intracranial abnormality. Electronically Signed   By: Logan Bores M.D.   On: 11/13/2020 17:27   CT ANGIO NECK W OR WO CONTRAST  Addendum Date: 11/13/2020   ADDENDUM REPORT: 11/13/2020 11:16 ADDENDUM: Transcription error in the impression. Should state occlusion or high-grade stenosis right petrous ICA. Electronically Signed   By: [name] M.D.   On: 11/13/2020 11:16   Result Date: 11/13/2020 CLINICAL DATA:  Code stroke follow-up, noncontrast CT positive for acute stroke EXAM: CT ANGIOGRAPHY HEAD AND NECK CT PERFUSION BRAIN TECHNIQUE: Multidetector CT imaging of the head and neck was performed using the standard protocol during bolus administration of intravenous contrast. Multiplanar CT image reconstructions and MIPs were obtained to evaluate the vascular anatomy. Carotid stenosis measurements (when applicable) are obtained utilizing NASCET criteria, using the distal internal carotid diameter as the denominator. Multiphase CT imaging of the brain was performed following IV bolus contrast injection. Subsequent parametric perfusion maps were calculated using RAPID software. CONTRAST:  121mL OMNIPAQUE IOHEXOL 350 MG/ML SOLN COMPARISON:  None. FINDINGS: CTA NECK FINDINGS Aortic arch: Great vessel  origins are patent. 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 Right carotid system: Patent. No measurable stenosis at the ICA origin. Left carotid system: Patent. No measurable stenosis at the ICA origin Vertebral arteries: Patent. Left vertebral artery is dominant. No measurable stenosis or evidence of dissection. Skeleton: No significant osseous abnormality Other neck: No mass or adenopathy. Upper chest: No apical lung mass. Review of the MIP images confirms the above findings CTA HEAD FINDINGS Anterior circulation: There is loss of right ICA enhancement within the carotid canal with reconstitution distally. Cavernous and supraclinoid portions are patent. Intracranial left ICA is patent. Bilateral anterior and middle cerebral arteries are patent. Posterior circulation: Intracranial vertebral arteries are patent and become diminutive after PICA origins. Basilar artery is patent and small in caliber. This is due to bilateral fetal origin of posterior cerebral arteries. Venous sinuses: As permitted by contrast timing, patent. 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 Remainder of proximal intracranial vessels are patent. Perfusion imaging demonstrates no evidence of core infarction despite findings on noncontrast head CT. Calculated territory at risk of 242 mL throughout the right MCA territory as well as MCA/ACA and MCA/PCA watershed territories. No occlusion or hemodynamically significant stenosis in the neck. Electronically Signed: By: [name] M.D. On: 11/13/2020 11:05   MR ANGIO HEAD WO CONTRAST  Result Date: 11/14/2020 CLINICAL DATA:  Follow-up examination for acute stroke. History of emergent stent assisted  angioplasty of right ICA petrous stenosis. EXAM: MRI HEAD WITHOUT CONTRAST MRA HEAD WITHOUT CONTRAST TECHNIQUE: Multiplanar, multiecho pulse sequences of the brain and surrounding structures were obtained without intravenous contrast. Angiographic images of the head were obtained using MRA technique without contrast. COMPARISON:  Prior CTs from 11/13/2020. FINDINGS: MRI HEAD FINDINGS Brain: Examination degraded by  motion artifact. Cerebral volume within normal limits. No significant cerebral white matter disease. Patchy multifocal ischemic infarcts seen involving the subcortical and deep white matter of the right frontal and parietal lobes, largely watershed in distribution. Largest area of ischemia measures up to 3.1 cm at the right corona radiata just above the right basal ganglia. No associated hemorrhage or significant mass effect. No other evidence for acute or subacute ischemia. Gray-white matter differentiation otherwise maintained. No encephalomalacia to suggest chronic cortical infarction elsewhere within the brain. No evidence for acute or chronic intracranial hemorrhage. No mass lesion, midline shift or mass effect. No hydrocephalus or extra-axial fluid collection. Pituitary gland suprasellar region normal. Midline structures intact. Vascular: Major intracranial vascular flow voids are maintained. Diminutive vertebrobasilar system noted. Skull and upper cervical spine: Craniocervical junction within normal limits. Upper cervical spine normal. Diffusely decreased T1 signal intensity seen within the visualized bone marrow, nonspecific, but most commonly related to anemia, smoking, or obesity. No focal marrow replacing lesion. No scalp soft tissue abnormality. Sinuses/Orbits: Globes and orbital soft tissues within normal limits. Mild scattered mucosal thickening noted within the paranasal sinuses. Right maxillary sinus retention cyst noted. Trace right mastoid effusion noted, of doubtful significance. Inner  ear structures grossly normal. Other: None. MRA HEAD FINDINGS ANTERIOR CIRCULATION: Visualized distal cervical segments of the internal carotid arteries are patent with antegrade flow. Flow related signal loss seen within the petrous right ICA consistent with recent stent placement. Evaluation for implant stenosis limited due to susceptibility artifact. Widely plate and flow is seen distally within the cavernous and supraclinoid right ICA. The contralateral petrous, cavernous, and supraclinoid left ICAs widely patent. A1 segments widely patent. Normal anterior communicating artery complex. Anterior cerebral arteries patent to their distal aspects without stenosis. No M1 stenosis or occlusion. Negative MCA bifurcations. Distal MCA branches well perfused and symmetric. POSTERIOR CIRCULATION: Left vertebral artery dominant and widely patent to the vertebrobasilar junction. Hypoplastic right vertebral artery largely terminates in PICA. Right PICA itself is patent. Left PICA origin patent and normal as well. Basilar diffusely diminutive but remains widely patent. Superior cerebellar arteries patent bilaterally. Predominant fetal type origin of both PCAs supplied via robust bilateral posterior communicating arteries. PCAs remain widely patent to their distal aspects. No intracranial aneurysm. IMPRESSION: MRI HEAD IMPRESSION: 1. 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 No associated hemorrhage or significant mass effect. 2. Otherwise normal brain MRI for age. MRA HEAD IMPRESSION: 1. Sequelae of recent stent placement within the petrous right ICA. Evaluation for implant stenosis limited by susceptibility artifact, however, widely patent flow seen distally within the right carotid siphon. 2. No other hemodynamically significant stenosis or other vascular abnormality within the  intracranial circulation. 3. Fetal type origin of both PCAs with overall diminutive vertebrobasilar system. Electronically Signed   By: Jeannine Boga M.D.   On: 11/14/2020 04:59   MR BRAIN WO CONTRAST  Result Date: 11/14/2020 CLINICAL DATA:  Follow-up examination for acute stroke. History of emergent stent assisted angioplasty of right ICA petrous stenosis. EXAM: MRI HEAD WITHOUT CONTRAST MRA HEAD WITHOUT CONTRAST TECHNIQUE: Multiplanar, multiecho pulse sequences of the brain and surrounding structures were obtained without intravenous contrast. Angiographic images of the head were obtained using MRA technique without contrast. COMPARISON:  Prior CTs from 11/13/2020. FINDINGS: MRI HEAD FINDINGS Brain: Examination degraded by motion artifact. Cerebral volume within normal limits. No significant cerebral white matter disease. Patchy multifocal  ischemic infarcts seen involving the subcortical and deep white matter of the right frontal and parietal lobes, largely watershed in distribution. Largest area of ischemia measures up to 3.1 cm at the right corona radiata just above the right basal ganglia. No associated hemorrhage or significant mass effect. No other evidence for acute or subacute ischemia. Gray-white matter differentiation otherwise maintained. No encephalomalacia to suggest chronic cortical infarction elsewhere within the brain. No evidence for acute or chronic intracranial hemorrhage. No mass lesion, midline shift or mass effect. No hydrocephalus or extra-axial fluid collection. Pituitary gland suprasellar region normal. Midline structures intact. Vascular: Major intracranial vascular flow voids are maintained. Diminutive vertebrobasilar system noted. Skull and upper cervical spine: Craniocervical junction within normal limits. Upper cervical spine normal. Diffusely decreased T1 signal intensity seen within the visualized bone marrow, nonspecific, but most commonly related to anemia, smoking, or  obesity. No focal marrow replacing lesion. No scalp soft tissue abnormality. Sinuses/Orbits: Globes and orbital soft tissues within normal limits. Mild scattered mucosal thickening noted within the paranasal sinuses. Right maxillary sinus retention cyst noted. Trace right mastoid effusion noted, of doubtful significance. Inner ear structures grossly normal. Other: None. MRA HEAD FINDINGS ANTERIOR CIRCULATION: Visualized distal cervical segments of the internal carotid arteries are patent with antegrade flow. Flow related signal loss seen within the petrous right ICA consistent with recent stent placement. Evaluation for implant stenosis limited due to susceptibility artifact. Widely plate and flow is seen distally within the cavernous and supraclinoid right ICA. The contralateral petrous, cavernous, and supraclinoid left ICAs widely patent. A1 segments widely patent. Normal anterior communicating artery complex. Anterior cerebral arteries patent to their distal aspects without stenosis. No M1 stenosis or occlusion. Negative MCA bifurcations. Distal MCA branches well perfused and symmetric. POSTERIOR CIRCULATION: Left vertebral artery dominant and widely patent to the vertebrobasilar junction. Hypoplastic right vertebral artery largely terminates in PICA. Right PICA itself is patent. Left PICA origin patent and normal as well. Basilar diffusely diminutive but remains widely patent. Superior cerebellar arteries patent bilaterally. Predominant fetal type origin of both PCAs supplied via robust bilateral posterior communicating arteries. PCAs remain widely patent to their distal aspects. No intracranial aneurysm. IMPRESSION: MRI HEAD IMPRESSION: 1. 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 No associated hemorrhage or significant mass effect. 2. Otherwise normal brain MRI for  age. MRA HEAD IMPRESSION: 1. Sequelae of recent stent placement within the petrous right ICA. Evaluation for implant stenosis limited by susceptibility artifact, however, widely patent flow seen distally within the right carotid siphon. 2. No other hemodynamically significant stenosis or other vascular abnormality within the intracranial circulation. 3. Fetal type origin of both PCAs with overall diminutive vertebrobasilar system. Electronically Signed   By: Jeannine Boga M.D.   On: 11/14/2020 04:59   IR Intra Cran Stent  Result Date: 11/18/2020 INDICATION: New onset right gaze deviation and left sided weakness arm greater than leg. 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 EXAM: 1. EMERGENT LARGE VESSEL OCCLUSION THROMBOLYSIS (anterior CIRCULATION) COMPARISON:  CT angiogram of the head and neck November 13, 2020. MEDICATIONS: Ancef 2 g IV antibiotic was administered within 1 hour of the procedure. ANESTHESIA/SEDATION: General anesthesia CONTRAST:  Isovue 300 approximately 100 mL FLUOROSCOPY TIME:  Fluoroscopy Time: 21 minutes 18 seconds (1739 mGy). COMPLICATIONS: None immediate. TECHNIQUE: Following a full explanation of  the procedure along with the potential associated complications, an informed witnessed consent was obtained patient's mother. The risks of intracranial hemorrhage of 10%, worsening neurological deficit, ventilator dependency, death and inability to revascularize were all reviewed in detail with the patient's mother. The patient was then put under general anesthesia by the Department of Anesthesiology at [facility]. The right groin was prepped and draped in the usual sterile fashion. Thereafter using modified Seldinger technique, transfemoral access into the right common femoral artery was obtained without difficulty. Over a 0.035 inch guidewire an 8 French 25 cm Pinnacle sheath was inserted.  Through this, and also over a 0.035 inch guidewire a combination of a 5.5 [place] Berenstein support catheter inside of an 087 balloon guide catheter was advanced to the aortic arch region and selectively positioned in the right common carotid artery. The guidewire and the support catheter were removed. Good aspiration was obtained from the hub of the balloon guide catheter. Gentle control arteriogram performed through this demonstrated the right external carotid artery and its major branches to be widely patent. The right internal carotid artery at the bulb and its proximal half appears widely patent. There is a tapered severe narrowing leading up to pre occlusive stenosis of the horizontal petrous segment of the right internal carotid artery. Slow flow distal to this is noted into the petrous, cavernous and supraclinoid segments. The right middle cerebral artery opacifies into the capillary and venous phases. Unopacified blood is seen in the middle cerebral artery from the contralateral left ICA via the anterior communicating artery. No gross filling defects are seen in the right MCA branches. Over a 0.014 inch Synchro standard micro guidewire with a J-tip configuration, a 2.5 mm x 15 mm Gateway angioplasty balloon catheter which had been prepped with heparinized saline infusion, and flushed with heparinized saline infusion was advanced to the distal cervical right ICA. The micro guidewire was then gently manipulated without much resistance to the petrous cervical junction. This was then followed by the advancement of the Gateway angioplasty microcatheter such that the distal marker was distal to the stenosis and the proximal marker was proximal to the stenosis. Thereafter a control angioplasty was then performed using micro inflation syringe device via micro tubing to 6 atmospheres achieving a diameter of 2.5 mm where it was maintained for approximately 60 seconds. Balloon was then deflated and retrieved  proximally. Control arteriogram performed through balloon guide and the now proximal right ICA demonstrated modest improvement in the caliber of the angioplastied segment with free flow more distally into the right middle cerebral artery distribution. A second angioplasty was then performed again to 6 atmospheres where it was maintained for approximately 4 seconds. Balloon was then deflated and advanced more distally over the wire into the proximal cavernous segment. The guidewire was removed. Gateway angioplasty balloon catheter was then exchanged for an 014 inch support 300 cm exchange micro guidewire. The micro guidewire had a J configuration to avoid dissections or inducing spasm. Control arteriogram performed through the balloon guide in the proximal right ICA demonstrated moderately improved caliber of the angioplastied segment of the right internal carotid artery petrous segment. Measurements were then performed of the right internal carotid artery and the most normal segment cervical petrous junction, and the distal cervical right ICA. It was decided to place a 3.5 mm x 34 mm Resolute Onyx stent. This was then retrogradely prepped with heparinized saline infusion, and with 50% contrast and 50% heparinized saline infusion. Using the rapid exchange technique, stent  delivery system was then advanced without difficulty and positioned with the distal marker at the mid petrous right ICA, and the proximal marker at the cervical petrous junction. Thereafter, stent was deployed by inflating the balloon using micro inflation syringe device via micro tubing to approximately 3 atmospheres where it was maintained for approximately 25 seconds. The balloon was then deflated and retrieved proximally. A control arteriogram performed through the balloon guide in the right internal carotid artery demonstrated excellent apposition of the distal and the proximal portion of the stent with excellent caliber with minimal stenosis  of the angioplastied segment. Moderate spasm was noted just distal to the stent which responded to 25 mcg of nitroglycerin intra-arterially. For better apposition at the proximal end, the balloon was retrieved proximally and a second inflation was performed again to 9 atmospheres which was maintained for approximately 20 seconds. The balloon was deflated and retrieved under constant visualization. Stent balloon was retrieved and removed with exchange guidewire in the distal cavernous segment. Control arteriograms were then performed at 15 and 30 minutes post stent deployment. These continued to demonstrate excellent flow through the stented segment and throughout the right internal carotid artery extra cranially and intracranially. Significant improved caliber and flow was noted in the right middle cerebral and the now right anterior cerebral artery into the capillary and venous phases with a TICI 3 revascularization. Right posterior communicating artery was visualized with wide patency also. A final control arteriogram performed through the balloon guide in the right common carotid artery following removal of the exchange micro guidewire continued to demonstrate excellent flow through right internal carotid artery extra cranially and intracranially without evidence of intraluminal filling defects or of occlusions or of spasm at the site of the stent, and also intracranially. Balloon guide was then removed. Diagnostic catheter was then introduced into the left common carotid artery. Arteriogram performed demonstrated the left external carotid artery and its major branches to be widely patent. The left internal carotid artery at the bulb to the cranial skull base also demonstrates wide patency with minimal arteriosclerotic disease in the proximal cavernous segment. Left posterior communicating artery was seen opacifying the left posterior cerebral distribution. The left middle cerebral artery and the left anterior  cerebral artery opacify into the capillary and venous phases. Partial cross-filling via the anterior communicating artery of the right anterior cerebral A1 segment was noted. Incidental note made of the presence of a marginal sinus on the right side communicating from the posterior aspect of the right internal carotid artery bulb to the region of the right transverse sinus proximal region/torcula junction, a developmental variation. The patient was loaded with 81 mg of aspirin and 180 mg of Brilinta via orogastric tube prior to the angioplasty. Right before stent placement, patient was given a bolus dose of cangrelor IV followed by 4 hour infusion with a CT to follow at that time. Balloon guide was removed with successful hemostasis achieved at the right groin puncture site with an 8 French Angio-Seal closure device. The right groin appeared soft. Distal pulses remained Dopplerable in both feet unchanged. An immediate CT of the brain demonstrated no evidence of intracranial hemorrhage, or mass effect or midline shift. Patient was then extubated without difficulty. Upon recovery, the patient was able to bend her left knee and follow simple commands appropriately. She denied any headaches, nausea, vomiting or visual changes. Minimal motor movement was evident in the left upper extremity. The patient was then transferred to the neuro ICU via the PACU. FINDINGS:  As above. PROCEDURE: As above. 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 PLAN: Follow-up in the clinic 2 to 3 weeks post discharge. Electronically Signed   By: Luanne Bras M.D.   On: 11/15/2020 12:11   IR CT  Head Ltd  Result Date: 11/18/2020 INDICATION: New onset right gaze deviation and left sided weakness arm greater than leg. 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 EXAM: 1. EMERGENT LARGE VESSEL OCCLUSION THROMBOLYSIS (anterior CIRCULATION) COMPARISON:  CT angiogram of the head and neck November 13, 2020. MEDICATIONS: Ancef 2 g IV antibiotic was administered within 1 hour of the procedure. ANESTHESIA/SEDATION: General anesthesia CONTRAST:  Isovue 300 approximately 100 mL FLUOROSCOPY TIME:  Fluoroscopy Time: 21 minutes 18 seconds (1739 mGy). COMPLICATIONS: None immediate. TECHNIQUE: Following a full explanation of the procedure along with the potential associated complications, an informed witnessed consent was obtained patient's mother. The risks of intracranial hemorrhage of 10%, worsening neurological deficit, ventilator dependency, death and inability to revascularize were all reviewed in detail with the patient's mother. The patient was then put under general anesthesia by the Department of Anesthesiology at [facility]. The right groin was prepped and draped in the usual sterile fashion. Thereafter using modified Seldinger technique, transfemoral access into the right common femoral artery was obtained without difficulty. Over a 0.035 inch guidewire an 8 French 25 cm Pinnacle sheath was inserted. Through this, and also over a 0.035 inch guidewire a combination of a 5.5 [place] Berenstein support catheter inside of an 087 balloon guide catheter was advanced to the aortic arch region and selectively positioned in the right common carotid artery. The guidewire and the support catheter were removed. Good aspiration was obtained from the hub of the balloon guide catheter. Gentle control arteriogram performed through this demonstrated the right external carotid artery and its major branches to be widely patent. The  right internal carotid artery at the bulb and its proximal half appears widely patent. There is a tapered severe narrowing leading up to pre occlusive stenosis of the horizontal petrous segment of the right internal carotid artery. Slow flow distal to this is noted into the petrous, cavernous and supraclinoid segments. The right middle cerebral artery opacifies into the capillary and venous phases. Unopacified blood is seen in the middle cerebral artery from the contralateral left ICA via the anterior communicating artery. No gross filling defects are seen in the right MCA branches. Over a 0.014 inch Synchro standard micro guidewire with a J-tip configuration, a 2.5 mm x 15 mm Gateway angioplasty balloon catheter which had been prepped with heparinized saline infusion, and flushed with heparinized saline infusion was advanced to the distal cervical right ICA. The micro guidewire was then gently manipulated without much resistance to the petrous cervical junction. This was then followed by the advancement of the Gateway angioplasty microcatheter such that the distal marker was distal to the stenosis and the proximal marker was proximal to the stenosis. Thereafter a control angioplasty was then performed using micro inflation syringe device via micro tubing to 6 atmospheres achieving a diameter of 2.5 mm where it was maintained for approximately 60 seconds. Balloon was then deflated and retrieved proximally. Control  arteriogram performed through balloon guide and the now proximal right ICA demonstrated modest improvement in the caliber of the angioplastied segment with free flow more distally into the right middle cerebral artery distribution. A second angioplasty was then performed again to 6 atmospheres where it was maintained for approximately 4 seconds. Balloon was then deflated and advanced more distally over the wire into the proximal cavernous segment. The guidewire was removed. Gateway angioplasty balloon  catheter was then exchanged for an 014 inch support 300 cm exchange micro guidewire. The micro guidewire had a J configuration to avoid dissections or inducing spasm. Control arteriogram performed through the balloon guide in the proximal right ICA demonstrated moderately improved caliber of the angioplastied segment of the right internal carotid artery petrous segment. Measurements were then performed of the right internal carotid artery and the most normal segment cervical petrous junction, and the distal cervical right ICA. It was decided to place a 3.5 mm x 34 mm Resolute Onyx stent. This was then retrogradely prepped with heparinized saline infusion, and with 50% contrast and 50% heparinized saline infusion. Using the rapid exchange technique, stent delivery system was then advanced without difficulty and positioned with the distal marker at the mid petrous right ICA, and the proximal marker at the cervical petrous junction. Thereafter, stent was deployed by inflating the balloon using micro inflation syringe device via micro tubing to approximately 3 atmospheres where it was maintained for approximately 25 seconds. The balloon was then deflated and retrieved proximally. A control arteriogram performed through the balloon guide in the right internal carotid artery demonstrated excellent apposition of the distal and the proximal portion of the stent with excellent caliber with minimal stenosis of the angioplastied segment. Moderate spasm was noted just distal to the stent which responded to 25 mcg of nitroglycerin intra-arterially. For better apposition at the proximal end, the balloon was retrieved proximally and a second inflation was performed again to 9 atmospheres which was maintained for approximately 20 seconds. The balloon was deflated and retrieved under constant visualization. Stent balloon was retrieved and removed with exchange guidewire in the distal cavernous segment. Control arteriograms were then  performed at 15 and 30 minutes post stent deployment. These continued to demonstrate excellent flow through the stented segment and throughout the right internal carotid artery extra cranially and intracranially. Significant improved caliber and flow was noted in the right middle cerebral and the now right anterior cerebral artery into the capillary and venous phases with a TICI 3 revascularization. Right posterior communicating artery was visualized with wide patency also. A final control arteriogram performed through the balloon guide in the right common carotid artery following removal of the exchange micro guidewire continued to demonstrate excellent flow through right internal carotid artery extra cranially and intracranially without evidence of intraluminal filling defects or of occlusions or of spasm at the site of the stent, and also intracranially. Balloon guide was then removed. Diagnostic catheter was then introduced into the left common carotid artery. Arteriogram performed demonstrated the left external carotid artery and its major branches to be widely patent. The left internal carotid artery at the bulb to the cranial skull base also demonstrates wide patency with minimal arteriosclerotic disease in the proximal cavernous segment. Left posterior communicating artery was seen opacifying the left posterior cerebral distribution. The left middle cerebral artery and the left anterior cerebral artery opacify into the capillary and venous phases. Partial cross-filling via the anterior communicating artery of the right anterior cerebral A1 segment was noted. Incidental note  made of the presence of a marginal sinus on the right side communicating from the posterior aspect of the right internal carotid artery bulb to the region of the right transverse sinus proximal region/torcula junction, a developmental variation. The patient was loaded with 81 mg of aspirin and 180 mg of Brilinta via orogastric tube prior  to the angioplasty. Right before stent placement, patient was given a bolus dose of cangrelor IV followed by 4 hour infusion with a CT to follow at that time. Balloon guide was removed with successful hemostasis achieved at the right groin puncture site with an 8 French Angio-Seal closure device. The right groin appeared soft. Distal pulses remained Dopplerable in both feet unchanged. An immediate CT of the brain demonstrated no evidence of intracranial hemorrhage, or mass effect or midline shift. Patient was then extubated without difficulty. Upon recovery, the patient was able to bend her left knee and follow simple commands appropriately. She denied any headaches, nausea, vomiting or visual changes. Minimal motor movement was evident in the left upper extremity. The patient was then transferred to the neuro ICU via the PACU. FINDINGS: As above. PROCEDURE: As above. 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 PLAN: Follow-up in the clinic 2 to 3 weeks post discharge. Electronically Signed   By: Luanne Bras M.D.   On: 11/15/2020 12:11   CT CEREBRAL PERFUSION W CONTRAST  Addendum Date: 11/13/2020   ADDENDUM REPORT: 11/13/2020 11:16 ADDENDUM: Transcription error in the impression. Should state occlusion or high-grade stenosis right petrous ICA. Electronically Signed   By: [name] M.D.   On: 11/13/2020 11:16   Result Date: 11/13/2020 CLINICAL DATA:  Code stroke follow-up, noncontrast CT positive for acute stroke EXAM: CT ANGIOGRAPHY HEAD AND NECK CT PERFUSION BRAIN TECHNIQUE: Multidetector CT imaging of the head and neck was performed using the standard  protocol during bolus administration of intravenous contrast. Multiplanar CT image reconstructions and MIPs were obtained to evaluate the vascular anatomy. Carotid stenosis measurements (when applicable) are obtained utilizing NASCET criteria, using the distal internal carotid diameter as the denominator. Multiphase CT imaging of the brain was performed following IV bolus contrast injection. Subsequent parametric perfusion maps were calculated using RAPID software. CONTRAST:  155mL OMNIPAQUE IOHEXOL 350 MG/ML SOLN COMPARISON:  None. FINDINGS: CTA NECK FINDINGS Aortic arch: Great vessel origins are patent. 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 Right carotid system: Patent. No measurable stenosis at the ICA origin. Left carotid system: Patent. No measurable stenosis at the ICA origin Vertebral arteries: Patent. Left vertebral artery is dominant. No measurable stenosis or evidence of dissection. Skeleton: No significant osseous abnormality Other neck: No mass or adenopathy. Upper chest: No apical lung mass. Review of the MIP images confirms the above findings CTA HEAD FINDINGS Anterior circulation: There is loss of right ICA enhancement within the carotid canal with reconstitution distally. Cavernous and supraclinoid portions are patent. Intracranial left ICA is patent. Bilateral anterior and middle cerebral arteries are patent. Posterior circulation: Intracranial vertebral arteries are patent and become diminutive after PICA origins. Basilar artery is patent and small in caliber. This is due to bilateral fetal origin of posterior cerebral arteries. Venous sinuses: As permitted by contrast timing, patent. 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 Remainder of proximal intracranial vessels are patent. Perfusion imaging demonstrates no evidence of core infarction despite findings on noncontrast head CT. Calculated territory at risk of 242 mL throughout the right MCA territory as well as MCA/ACA and MCA/PCA watershed territories. No occlusion or hemodynamically significant stenosis in the neck. Electronically Signed: By: [name] M.D. On: 11/13/2020 11:05   DG Chest Port 1 View  Result Date: 11/13/2020 CLINICAL DATA:  40 year old female with cough. EXAM: PORTABLE CHEST 1 VIEW COMPARISON:  None. FINDINGS: No focal consolidation, pleural effusion or pneumothorax. The cardiac silhouette is within limits. No acute osseous pathology. IMPRESSION: No active disease. Electronically Signed   By: Anner Crete M.D.   On: 11/13/2020 18:47   VAS [place] TRANSCRANIAL DOPPLER W BUBBLES  Result Date: 11/16/2020  Transcranial Doppler with Bubble Indications: Stroke. Comparison Study: No prior studies. Performing Technologist: Darlin Coco RDMS  Examination Guidelines: A complete evaluation includes B-mode imaging, spectral Doppler, color Doppler, and power Doppler as needed of all accessible portions of each vessel. Bilateral testing is considered an integral part of a complete examination. Limited examinations for reoccurring indications may be performed as noted.  Summary:  A vascular evaluation was performed. The left middle cerebral artery was studied. An IV was inserted into the patient's right forearm. Verbal informed consent was obtained.  Between 5 and 15 HITS (High Intensity Transient Signals) were heard at rest and with valsalva maneuver, indicating a Spencer Grade 2 PFO (Patent Foramen Ovale). *See table(s) above for TCD measurements and observations.  Diagnosing physician: Antony Contras MD Electronically signed by Antony Contras MD on 11/16/2020 at 11:16:01 AM.    Final    ECHOCARDIOGRAM COMPLETE  Result Date: 11/14/2020     ECHOCARDIOGRAM REPORT   Patient Name:   Dana Henry Date of Exam: 11/14/2020 Medical Rec #:  [id number]      Height:       64.0 in Accession #:    [id number]     Weight:       180.0 lb Date of Birth:  01/19/1981     BSA:          1.871 m Patient Age:    87 years       BP:           126/70 mmHg Patient Gender: F              HR:           78 bpm. Exam Location:  Inpatient Procedure: 2D Echo, 3D Echo, Cardiac Doppler and Color Doppler Indications:    Stroke  History:        Patient has no prior history of Echocardiogram examinations.                 Stroke. Covid infection 12/21.  Sonographer:    Roseanna Rainbow RDCS Referring Phys: [id number] [facility]  Sonographer Comments: Technically difficult study due to poor echo windows. IMPRESSIONS  1. Left ventricular ejection fraction, by estimation, is 60 to 65%. The left ventricle has normal function. The left ventricle has no regional wall motion abnormalities. Left ventricular diastolic parameters were normal.  2. Right ventricular systolic function is normal. The right ventricular size is normal. There is normal pulmonary artery systolic pressure. The estimated  right ventricular systolic pressure is 0000000 mmHg.  3. The mitral valve is normal in structure. Trivial mitral valve regurgitation. No evidence of mitral stenosis.  4. The aortic valve is tricuspid. Aortic valve regurgitation is not visualized. No aortic stenosis is present.  5. The inferior vena cava is normal in size with greater than 50% respiratory variability, suggesting right atrial pressure of 3 mmHg. FINDINGS  Left Ventricle: Left ventricular ejection fraction, by estimation, is 60 to 65%. The left ventricle has normal function. The left ventricle has no regional wall motion abnormalities. The left ventricular internal cavity size was normal in size. There is  no left ventricular hypertrophy. Left ventricular diastolic parameters were normal. Right Ventricle: The right ventricular size is normal. No increase in  right ventricular wall thickness. Right ventricular systolic function is normal. There is normal pulmonary artery systolic pressure. The tricuspid regurgitant velocity is 1.84 m/s, and  with an assumed right atrial pressure of 3 mmHg, the estimated right ventricular systolic pressure is 0000000 mmHg. Left Atrium: Left atrial size was normal in size. Right Atrium: Right atrial size was normal in size. Pericardium: There is no evidence of pericardial effusion. Mitral Valve: The mitral valve is normal in structure. Trivial mitral valve regurgitation. No evidence of mitral valve stenosis. Tricuspid Valve: The tricuspid valve is normal in structure. Tricuspid valve regurgitation is trivial. Aortic Valve: The aortic valve is tricuspid. Aortic valve regurgitation is not visualized. No aortic stenosis is present. Pulmonic Valve: The pulmonic valve was normal in structure. Pulmonic valve regurgitation is trivial. Aorta: The aortic root is normal in size and structure. Venous: The inferior vena cava is normal in size with greater than 50% respiratory variability, suggesting right atrial pressure of 3 mmHg. IAS/Shunts: No atrial level shunt detected by color flow Doppler.  LEFT VENTRICLE PLAX 2D LVIDd:         4.10 cm     Diastology LVIDs:         2.30 cm     LV e' medial:    9.25 cm/s LV PW:         1.20 cm     LV E/e' medial:  9.6 LV IVS:        1.10 cm     LV e' lateral:   10.70 cm/s LVOT diam:     1.50 cm     LV E/e' lateral: 8.3 LV SV:         34 LV SV Index:   18 LVOT Area:     1.77 cm  LV Volumes (MOD) LV vol d, MOD A2C: 38.0 ml LV vol d, MOD A4C: 61.9 ml LV vol s, MOD A2C: 16.8 ml LV vol s, MOD A4C: 21.5 ml LV SV MOD A2C:     21.2 ml LV SV MOD A4C:     61.9 ml LV SV MOD BP:      28.4 ml RIGHT VENTRICLE             IVC RV S prime:     12.20 cm/s  IVC diam: 2.10 cm TAPSE (M-mode): 2.0 cm LEFT ATRIUM             Index       RIGHT ATRIUM           Index LA diam:        3.40 cm 1.82 cm/m  RA Area:     10.70 cm LA Vol (A2C):    24.2 ml 12.94 ml/m RA Volume:  19.30 ml  10.32 ml/m LA Vol (A4C):   23.6 ml 12.62 ml/m LA Biplane Vol: 24.1 ml 12.88 ml/m  AORTIC VALVE LVOT Vmax:   105.00 cm/s LVOT Vmean:  65.700 cm/s LVOT VTI:    0.191 m  AORTA Ao Root diam: 3.10 cm Ao Asc diam:  2.80 cm MITRAL VALVE               TRICUSPID VALVE MV Area (PHT): 5.20 cm    TR Peak grad:   13.5 mmHg MV Decel Time: 146 msec    TR Vmax:        184.00 cm/s MV E velocity: 89.20 cm/s MV A velocity: 72.70 cm/s  SHUNTS MV E/A ratio:  1.23        Systemic VTI:  0.19 m                            Systemic Diam: 1.50 cm Loralie Champagne MD Electronically signed by Loralie Champagne MD Signature Date/Time: 11/14/2020/6:43:52 PM    Final    ECHO TEE  Result Date: 11/18/2020    TRANSESOPHOGEAL ECHO REPORT   Patient Name:   Dana Henry Date of Exam: 11/18/2020 Medical Rec #:  [id number]      Height:       64.0 in Accession #:    [id number]     Weight:       180.0 lb Date of Birth:  10-Jun-1981     BSA:          1.871 m Patient Age:    33 years       BP:           90/66 mmHg Patient Gender: F              HR:           86 bpm. Exam Location:  Inpatient Procedure: Transesophageal Echo, 3D Echo, Color Doppler, Cardiac Doppler and            Saline Contrast Bubble Study Indications:     Stroke i163.9  History:         Patient has prior history of Echocardiogram examinations, most                  recent 11/14/2020.  Sonographer:     Raquel Sarna Senior RDCS Referring Phys:  [id number] Leanor Kail Diagnosing Phys: Cherlynn Kaiser MD PROCEDURE: After discussion of the risks and benefits of a TEE, an informed consent was obtained from the patient. TEE procedure time was 19 minutes. The transesophogeal probe was passed without difficulty through the esophogus of the patient. Imaged were obtained with the patient in a left lateral decubitus position. Local oropharyngeal anesthetic was provided with Cetacaine. Sedation performed by different physician. The patient was monitored while  under deep sedation. Anesthestetic sedation was provided intravenously by Anesthesiology: 358mg  of Propofol, 80mg  of Lidocaine. Image quality was good. The patient's vital signs; including heart rate, blood pressure, and oxygen saturation; remained stable throughout the procedure. The patient developed no complications during the procedure. IMPRESSIONS  1. Evidence of atrial level shunting detected by color flow Doppler. Agitated saline contrast bubble study was negative, could not demonstrate interatrial shunt. Probable PFO, though right to left shunting could not be provoked on this exam.  2. Left ventricular ejection fraction, by estimation, is 55 to 60%. The left ventricle has normal function.  3. Right ventricular systolic function is normal. The right ventricular size  is normal.  4. No left atrial/left atrial appendage thrombus was detected.  5. The mitral valve is normal in structure. Trivial mitral valve regurgitation. No evidence of mitral stenosis.  6. The aortic valve is tricuspid. Aortic valve regurgitation is not visualized. FINDINGS  Left Ventricle: Left ventricular ejection fraction, by estimation, is 55 to 60%. The left ventricle has normal function. The left ventricular internal cavity size was normal in size. Right Ventricle: The right ventricular size is normal. No increase in right ventricular wall thickness. Right ventricular systolic function is normal. Left Atrium: Left atrial size was normal in size. No left atrial/left atrial appendage thrombus was detected. Right Atrium: Right atrial size was normal in size. Pericardium: There is no evidence of pericardial effusion. Mitral Valve: The mitral valve is normal in structure. Trivial mitral valve regurgitation. No evidence of mitral valve stenosis. Tricuspid Valve: The tricuspid valve is normal in structure. Tricuspid valve regurgitation is trivial. No evidence of tricuspid stenosis. Aortic Valve: The aortic valve is tricuspid. Aortic valve  regurgitation is not visualized. Pulmonic Valve: The pulmonic valve was grossly normal. Pulmonic valve regurgitation is trivial. Aorta: The aortic root and ascending aorta are structurally normal, with no evidence of dilitation. IAS/Shunts: Evidence of atrial level shunting detected by color flow Doppler. Agitated saline contrast was given intravenously to evaluate for intracardiac shunting. Agitated saline contrast bubble study was negative, with no evidence of any interatrial shunt.   3D Volume EF LV 3D EDV:   95.89 ml LV 3D ESV:   48.33 ml AORTIC VALVE LVOT Vmax:   102.00 cm/s LVOT Vmean:  71.500 cm/s LVOT VTI:    0.195 m  SHUNTS Systemic VTI: 0.20 m Cherlynn Kaiser MD Electronically signed by Cherlynn Kaiser MD Signature Date/Time: 11/18/2020/12:44:09 PM    Final    IR PERCUTANEOUS ART THROMBECTOMY/INFUSION INTRACRANIAL INC DIAG ANGIO  Result Date: 11/18/2020 INDICATION: New onset right gaze deviation and left sided weakness arm greater than leg. 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 EXAM: 1. EMERGENT LARGE VESSEL OCCLUSION THROMBOLYSIS (anterior CIRCULATION) COMPARISON:  CT angiogram of the head and neck November 13, 2020. MEDICATIONS: Ancef 2 g IV antibiotic was administered within 1 hour of the procedure. ANESTHESIA/SEDATION: General anesthesia CONTRAST:  Isovue 300 approximately 100 mL FLUOROSCOPY TIME:  Fluoroscopy Time: 21 minutes 18 seconds (1739 mGy). COMPLICATIONS: None immediate. TECHNIQUE: Following a full explanation of the procedure along with the potential associated complications, an informed witnessed consent was obtained patient's mother. The risks of intracranial hemorrhage of 10%, worsening neurological deficit, ventilator dependency, death and inability to revascularize were all reviewed in detail with the patient's mother. The patient was then put under general anesthesia by the Department of  Anesthesiology at [facility]. The right groin was prepped and draped in the usual sterile fashion. Thereafter using modified Seldinger technique, transfemoral access into the right common femoral artery was obtained without difficulty. Over a 0.035 inch guidewire an 8 French 25 cm Pinnacle sheath was inserted. Through this, and also over a 0.035 inch guidewire a combination of a 5.5 [place] Berenstein support catheter inside of an 087 balloon guide catheter was advanced to the aortic arch region and selectively positioned in the right common carotid artery. The guidewire and the support catheter were removed. Good aspiration was obtained from the hub of the balloon guide catheter. Gentle control arteriogram performed through this demonstrated the right external carotid artery and its major  branches to be widely patent. The right internal carotid artery at the bulb and its proximal half appears widely patent. There is a tapered severe narrowing leading up to pre occlusive stenosis of the horizontal petrous segment of the right internal carotid artery. Slow flow distal to this is noted into the petrous, cavernous and supraclinoid segments. The right middle cerebral artery opacifies into the capillary and venous phases. Unopacified blood is seen in the middle cerebral artery from the contralateral left ICA via the anterior communicating artery. No gross filling defects are seen in the right MCA branches. Over a 0.014 inch Synchro standard micro guidewire with a J-tip configuration, a 2.5 mm x 15 mm Gateway angioplasty balloon catheter which had been prepped with heparinized saline infusion, and flushed with heparinized saline infusion was advanced to the distal cervical right ICA. The micro guidewire was then gently manipulated without much resistance to the petrous cervical junction. This was then followed by the advancement of the Gateway angioplasty microcatheter such that the distal marker was distal to the  stenosis and the proximal marker was proximal to the stenosis. Thereafter a control angioplasty was then performed using micro inflation syringe device via micro tubing to 6 atmospheres achieving a diameter of 2.5 mm where it was maintained for approximately 60 seconds. Balloon was then deflated and retrieved proximally. Control arteriogram performed through balloon guide and the now proximal right ICA demonstrated modest improvement in the caliber of the angioplastied segment with free flow more distally into the right middle cerebral artery distribution. A second angioplasty was then performed again to 6 atmospheres where it was maintained for approximately 4 seconds. Balloon was then deflated and advanced more distally over the wire into the proximal cavernous segment. The guidewire was removed. Gateway angioplasty balloon catheter was then exchanged for an 014 inch support 300 cm exchange micro guidewire. The micro guidewire had a J configuration to avoid dissections or inducing spasm. Control arteriogram performed through the balloon guide in the proximal right ICA demonstrated moderately improved caliber of the angioplastied segment of the right internal carotid artery petrous segment. Measurements were then performed of the right internal carotid artery and the most normal segment cervical petrous junction, and the distal cervical right ICA. It was decided to place a 3.5 mm x 34 mm Resolute Onyx stent. This was then retrogradely prepped with heparinized saline infusion, and with 50% contrast and 50% heparinized saline infusion. Using the rapid exchange technique, stent delivery system was then advanced without difficulty and positioned with the distal marker at the mid petrous right ICA, and the proximal marker at the cervical petrous junction. Thereafter, stent was deployed by inflating the balloon using micro inflation syringe device via micro tubing to approximately 3 atmospheres where it was maintained  for approximately 25 seconds. The balloon was then deflated and retrieved proximally. A control arteriogram performed through the balloon guide in the right internal carotid artery demonstrated excellent apposition of the distal and the proximal portion of the stent with excellent caliber with minimal stenosis of the angioplastied segment. Moderate spasm was noted just distal to the stent which responded to 25 mcg of nitroglycerin intra-arterially. For better apposition at the proximal end, the balloon was retrieved proximally and a second inflation was performed again to 9 atmospheres which was maintained for approximately 20 seconds. The balloon was deflated and retrieved under constant visualization. Stent balloon was retrieved and removed with exchange guidewire in the distal cavernous segment. Control arteriograms were then performed at 15 and  30 minutes post stent deployment. These continued to demonstrate excellent flow through the stented segment and throughout the right internal carotid artery extra cranially and intracranially. Significant improved caliber and flow was noted in the right middle cerebral and the now right anterior cerebral artery into the capillary and venous phases with a TICI 3 revascularization. Right posterior communicating artery was visualized with wide patency also. A final control arteriogram performed through the balloon guide in the right common carotid artery following removal of the exchange micro guidewire continued to demonstrate excellent flow through right internal carotid artery extra cranially and intracranially without evidence of intraluminal filling defects or of occlusions or of spasm at the site of the stent, and also intracranially. Balloon guide was then removed. Diagnostic catheter was then introduced into the left common carotid artery. Arteriogram performed demonstrated the left external carotid artery and its major branches to be widely patent. The left internal  carotid artery at the bulb to the cranial skull base also demonstrates wide patency with minimal arteriosclerotic disease in the proximal cavernous segment. Left posterior communicating artery was seen opacifying the left posterior cerebral distribution. The left middle cerebral artery and the left anterior cerebral artery opacify into the capillary and venous phases. Partial cross-filling via the anterior communicating artery of the right anterior cerebral A1 segment was noted. Incidental note made of the presence of a marginal sinus on the right side communicating from the posterior aspect of the right internal carotid artery bulb to the region of the right transverse sinus proximal region/torcula junction, a developmental variation. The patient was loaded with 81 mg of aspirin and 180 mg of Brilinta via orogastric tube prior to the angioplasty. Right before stent placement, patient was given a bolus dose of cangrelor IV followed by 4 hour infusion with a CT to follow at that time. Balloon guide was removed with successful hemostasis achieved at the right groin puncture site with an 8 French Angio-Seal closure device. The right groin appeared soft. Distal pulses remained Dopplerable in both feet unchanged. An immediate CT of the brain demonstrated no evidence of intracranial hemorrhage, or mass effect or midline shift. Patient was then extubated without difficulty. Upon recovery, the patient was able to bend her left knee and follow simple commands appropriately. She denied any headaches, nausea, vomiting or visual changes. Minimal motor movement was evident in the left upper extremity. The patient was then transferred to the neuro ICU via the PACU. FINDINGS: As above. PROCEDURE: As above. 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 PLAN: Follow-up in the clinic 2 to 3 weeks post discharge. Electronically Signed   By: Luanne Bras M.D.   On: 11/15/2020 12:11   CT HEAD CODE STROKE WO CONTRAST  Result Date: 11/13/2020 CLINICAL DATA:  Code stroke.  Right-sided gaze, facial droop EXAM: CT HEAD WITHOUT CONTRAST TECHNIQUE: Contiguous axial images were obtained from the base of the skull through the vertex without intravenous contrast. COMPARISON:  None. FINDINGS: Brain: There is no acute intracranial hemorrhage. Hypoattenuation is present involving the right caudate, adjacent white matter, and superior lentiform nucleus. No significant mass effect. No hydrocephalus. No extra-axial collection. Vascular: No hyperdense vessel. Skull: Unremarkable. Sinuses/Orbits: No acute abnormality Other: Mastoid air cells are clear. ASPECTS [facility]  Stroke Program Early CT Score) - Ganglionic level infarction (caudate, lentiform nuclei, internal capsule, insula, M1-M3 cortex): 4 - Supraganglionic infarction (M4-M6 cortex): 3 Total score (0-10 with 10 being normal): 7 IMPRESSION: No acute intracranial hemorrhage. Acute infarction involving the right caudate, adjacent white matter, and superior lentiform nucleus. ASPECTS is 7. These results were called by telephone at the time of interpretation on 11/13/2020 at 10:34 am to provider Dr. Erlinda Hong, who verbally acknowledged these results. Electronically Signed   By: [name] M.D.   On: 11/13/2020 10:38   VAS [place] LOWER EXTREMITY VENOUS (DVT)  Result Date: 11/14/2020  Lower Venous DVT Study Indications: Stroke.  Risk Factors: None identified. Anticoagulation: Lovenox. Comparison Study: No previous exam Performing Technologist: Vonzell Schlatter RVT  Examination Guidelines: A complete evaluation includes B-mode imaging, spectral Doppler, color Doppler, and  power Doppler as needed of all accessible portions of each vessel. Bilateral testing is considered an integral part of a complete examination. Limited examinations for reoccurring indications may be performed as noted. The reflux portion of the exam is performed with the patient in reverse Trendelenburg.  +---------+---------------+---------+-----------+----------+--------------+ RIGHT    CompressibilityPhasicitySpontaneityPropertiesThrombus Aging +---------+---------------+---------+-----------+----------+--------------+ CFV      Full           Yes      Yes                                 +---------+---------------+---------+-----------+----------+--------------+ SFJ      Full                                                        +---------+---------------+---------+-----------+----------+--------------+ FV Prox  Full                                                        +---------+---------------+---------+-----------+----------+--------------+ FV Mid   Full                                                        +---------+---------------+---------+-----------+----------+--------------+ FV DistalFull                                                        +---------+---------------+---------+-----------+----------+--------------+ PFV      Full                                                        +---------+---------------+---------+-----------+----------+--------------+ POP      Full           Yes      Yes                                 +---------+---------------+---------+-----------+----------+--------------+  PTV      Full                                                        +---------+---------------+---------+-----------+----------+--------------+ PERO     Full                                                        +---------+---------------+---------+-----------+----------+--------------+    +---------+---------------+---------+-----------+----------+--------------+ LEFT     CompressibilityPhasicitySpontaneityPropertiesThrombus Aging +---------+---------------+---------+-----------+----------+--------------+ CFV      Full           Yes      Yes                                 +---------+---------------+---------+-----------+----------+--------------+ SFJ      Full                                                        +---------+---------------+---------+-----------+----------+--------------+ FV Prox  Full                                                        +---------+---------------+---------+-----------+----------+--------------+ FV Mid   Full                                                        +---------+---------------+---------+-----------+----------+--------------+ FV DistalFull                                                        +---------+---------------+---------+-----------+----------+--------------+ PFV      Full                                                        +---------+---------------+---------+-----------+----------+--------------+ POP      Full           Yes      Yes                                 +---------+---------------+---------+-----------+----------+--------------+ PTV      Full                                                        +---------+---------------+---------+-----------+----------+--------------+  PERO     Full                                                        +---------+---------------+---------+-----------+----------+--------------+  Summary: BILATERAL: - No evidence of deep vein thrombosis seen in the lower extremities, bilaterally. -No evidence of popliteal cyst, bilaterally.   *See table(s) above for measurements and observations. Electronically signed by Servando Snare MD on 11/14/2020 at 1:58:02 PM.    Final     Labs:  Basic Metabolic Panel: Recent Labs  Lab  11/15/20 0613 11/16/20 0151 11/17/20 0112 11/18/20 0154 11/19/20 0500  NA 140 136 137 136 138  K 3.5 3.4* 3.4* 3.3* 3.6  CL 109 108 106 106 106  CO2 20* 20* 20* 20* 21*  GLUCOSE 85 89 101* 95 94  BUN 10 10 9 10 13   CREATININE 0.80 0.81 0.82 0.81 0.86  CALCIUM 8.4* 8.2* 8.7* 9.0 9.1    CBC: Recent Labs  Lab 11/17/20 0112 11/18/20 0154 11/19/20 0500  WBC 6.2 6.5 6.4  NEUTROABS  --   --  3.9  HGB 11.4* 11.3* 11.8*  HCT 31.6* 31.7* 34.9*  MCV 87.1 87.3 89.5  PLT 222 216 229    CBG: Recent Labs  Lab 11/19/20 1637  GLUCAP 95   Family history.  Mother with hypertension.  Denies any colon cancer esophageal cancer or rectal cancer  Brief HPI:   Dana Henry is a 40 y.o. right-handed female with history of migraine headaches, Covid infection 09/2020 and remote tobacco abuse.  Lives with spouse and 2 young children.  Independent prior to admission.  Two-level home bed and bath main level.  Presented 11/13/2020 with acute onset of left-sided weakness.  She was noted to have a low-grade fever.  Admission chemistries unremarkable except creatinine 1.06 hemoglobin 14 SARS providers negative urine drug screen positive marijuana.  Cranial CT scan showed no acute intracranial hemorrhage.  Acute infarct involving the right caudate, adjacent white matter and superior lentiform nucleus.  Patient did not receive TPA.  CT angiogram showed occlusion of the right petrous ICA within the right carotid canal with reconstitution.  Remainder of proximal intracranial vessels were patent.  No occlusion or hemodynamically significant stenosis in the neck.  Patient underwent cerebral arteriogram with emergent stent assisted angioplasty of the right ICA per interventional radiology.  Echocardiogram with ejection fraction of 60 to 65%.  No wall motion abnormalities.  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  No associated hemorrhage or mass-effect.  Patient maintained on aspirin as well as Brilinta for CVA prophylaxis.  Subcutaneous Lovenox for DVT prophylaxis.  Initially on Cleviprex for blood pressure control.  TEE completed showing no hemodynamically significant valvular heart disease.  No LA appendage or thrombus.  No LV apical thrombus.  Due to patient decreased functional ability left side weakness she was admitted for a comprehensive rehab program.   Hospital Course: [place] Mccartin was admitted to rehab 11/18/2020 for inpatient therapies to consist of PT, ST and OT at least three hours five days a week. Past admission physiatrist, therapy team and rehab RN have worked together to provide customized collaborative inpatient rehab.  Pertaining to patient's acute ischemic right MCA infarction emergent  stenting angioplasty right ICA per interventional radiology.  She remained on aspirin and Brilinta as directed.  She would follow-up with neurology as well as interventional radiology.  Subcutaneous Lovenox for DVT prophylaxis no bleeding episodes.  She did have a remote Covid infection December 2021 SARS [place] is negative on this admission.  Remote tobacco abuse urine drug screen positive marijuana which she did receive counseling for.  Noted obesity BMI 30.90 dietary follow-up.  Noted hyperlipidemia patient remained on Lipitor.   Blood pressures were monitored on TID basis and controlled     Rehab course: During patient's stay in rehab weekly team conferences were held to monitor patient's progress, set goals and discuss barriers to discharge. At admission, patient required minimal assist 60 feet rolling walker minimal assist sit to stand minimal assist sit to supine.  Moderate assist upper body bathing max is lower body bathing max is soft body dressing total assist lower body dressing  Physical exam.  Blood pressure 112/57 pulse 77 temperature 98  respirations 18 oxygen saturation 90% room air Constitutional.  No acute distress HEENT Head.  Normocephalic and atraumatic Eyes.  Pupils round and reactive to light no discharge without nystagmus Neck.  Supple nontender no JVD without thyromegaly Cardiac regular rate rhythm no extra sounds or murmur heard Abdomen.  Soft nontender positive bowel sounds without rebound Respiratory effort normal no respiratory distress without wheeze Skin.  Warm and dry Musculoskeletal no swelling or tenderness normal range of motion Neurologic.  Alert oriented normal insight and awareness.  Cranial nerves unremarkable.  Left upper extremity 3 -/5 proximal to 3/5 distally left lower extremity 4/5 proximal to distal no focal sensory deficits  He/She  has had improvement in activity tolerance, balance, postural control as well as ability to compensate for deficits. He/She has had improvement in functional use RUE/LUE  and RLE/LLE as well as improvement in awareness.  Minimal assist mobility.  Requires minimal assist for basic self-care tasks.  Follow-up speech therapy she exhibited some very mild impairments in executive functioning high-level attention.  It was discussed with family for supervision for safety.  Full family teaching completed plan discharged home       Disposition: Discharged home    Diet: Regular Special Instructions: No driving smoking or alcohol  Medications at discharge 1.  Tylenol as needed 2.  Aspirin 81 mg p.o. daily 3.  Lipitor 40 mg p.o. daily 4.  Protonix 40 mg p.o. daily 5.  Brilinta 90 mg twice daily 6.  MiraLAX twice daily hold for loose stools  30-35 minutes were spent completing discharge summary and discharge planning  Discharge Instructions     Ambulatory referral to Neurology   Complete by: As directed    An appointment is requested in approximately 4 weeks right MCA infarction   Ambulatory referral to Physical Medicine Rehab   Complete by: As directed     Moderate complexity follow up 1-2 weeks right MCA infarct        Follow-up Information     Dana Arn, MD Follow up.   Specialty: Physical Medicine and Rehabilitation Why: Office to call for appointment Contact information: [address] [place] [address] [phone]                 Signed: Cathlyn Parsons 11/22/2020, 5:24 AM Patient was seen, face-face, and physical exam performed by me on day of discharge, greater than 30 minutes of total time spent.. Please see progress note from day of discharge  as well.  Delice Lesch, MD, ABPMR

## 2020-11-20 NOTE — Progress Notes (Signed)
Physical Therapy Session Note  Patient Details  Name: Dana Henry MRN: [id number] Date of Birth: Oct 23, 1980  Today's Date: 11/20/2020 PT Individual Time: 0800-0915 + 1345-1430 PT Individual Time Calculation (min): 75 min  + 45 min  Short Term Goals: Week 1:  PT Short Term Goal 1 (Week 1): STG = LTG due to ELOS  Skilled Therapeutic Interventions/Progress Updates:     1st session:  Pt greeted supine in bed, awake and agreeable to therapy. Reports mild stomach "cramping" and RN is aware. Pt reports she has not had a BM since admission which was >1 week ago. Encouraged OOB to recliner at end of session and during the day to promote toileting needs.   Supine<>sit mod I with bed features. Pt requesting to brush teeth and she was able to perform sit<>stand with supervision and no AD, ambulated sinkside where she performed oral care with supervision using RUE and LUE appropriately. Ambulated with CGA and no AD from her room to main therapy gym. Performed higher level gait training including lateral side stepping L<>R and tandem walking ~15ft, requiring CGA/minA due to frequent minor LOB's laterally.   Standing there-ex at // bars with supervision and BUE support to bar. Required hand-over-hand assist to maintain grasp in LUE and promote weight bearing during standing exercises: -1x10 mini squats -1x10 hip abduction with 4# ankle weight, bilaterally -1x10 hamstring curls with 4# ankle weight, bilaterally -1x20 heel raises *therapist cueing for correct muscle activation and sequencing throughout  NMR for dynamic standing balance on blue air-ex pad with CGA for steadying: -3x10 bicep curls with 3# dowel rod -2x5sec isometric bicep holds with elbow at 90deg on 3# dowel rod -1x60sec isometric bicep holds with elbow at 90deg on 3# dowel rod -1x10 chest press with 3# dowel rod -1x10 rows with 3# dowel rod (AAROM for end range) -1x10 shldr press with 3# dowel rod (AAROM for end range)  Ambulated  to ortho gym with CGA and no AD and setup at the upper extremity ergometer, where she completed x4 minutes forwards and x4 minutes backwards, requiring AAROM and hand-over-hand assist to maintain grasp with LUE. Completed this while standing.  Gait training x413ft with supervision and no AD, performing dual cog taks with serial casting by 3's from 100. Pt able to complete with 90% accuracy and noted decreased gait speed while completing. No formal LOB noted but patient reports mild fatigue. Gait training x628ft with supervision and no AD back to her room. She ended session seated in recliner with needs in reach, made comfortable.   2nd session:  Pt greeted ambulating from bathroom with staff supervision. Handoff of care from NT as pt agreeable to therapy. Ambulated from her room to main therapy gym with supervision and no AD, ~175t. Performed higher level balance with inverted bosu ball inside // bars. She required CGA for stepping on and off the inverted bosu ball but was able to maintain balance with CGA progressing to close supervision with BUE support on // bar. Performed static standing on inverted bosu ball for bouts of ~15-45 seconds. Attempted eyes closed but unable to maintain balance unassisted for >5 seconds. Performed perturbations to both bosu ball and her, random rhythmic patterns, pt able to control balance well throughout with intermittent RUE assist to // bar.   Focused remainder of session on LUE strengthening. Performed arm punches/jabs with 3# dumbbell forwards and across midline, able to complete full AROM. Also completed shldr press with 3# dumbbell, requiring AAROM for end range. Performed modified  chair push ups to promote LUE weight bearing and tricep facilitation. Performed lateral weight shifting L<>R along mat table with BUE's in external rotation, again promoting weight bearing.   Ambulated back to her room with supervision and no AD, ended session seated EOB with husband at  bedside. Husband confirmed tomorrow's amilly ed session.   Therapy Documentation Precautions:  Precautions Precautions: Fall Precaution Comments: left sided weakness (UE>LE) Restrictions Weight Bearing Restrictions: No  Therapy/Group: Individual Therapy  Dana Henry P Dana Henry PT 11/20/2020, 11:49 AM

## 2020-11-20 NOTE — Progress Notes (Signed)
Patient ID: Dana Henry, female   DOB: 11/03/1980, 40 y.o.   MRN: [id number] Team Conference Report to Patient/Family  Team Conference discussion was reviewed with the patient and caregiver, including goals, any changes in plan of care and target discharge date.  Patient and caregiver express understanding and are in agreement.  The patient has a target discharge date of 11/22/20.  Dyanne Iha 11/20/2020, 2:02 PM

## 2020-11-20 NOTE — Progress Notes (Signed)
Patient ID: Dana Henry, female   DOB: 12/23/1980, 40 y.o.   MRN: [id number] Met with the patient to review role of the nurse CM and collaboration with the SW to facilitate preparation for discharge. Reviewed secondary stroke risks including  HLD and hx of migraines and post COVID. Discussed cholesterol lowering diet and medications for HLD along with DAPT per MD of ASA and Brilinta twice a day. Patient given handouts on ASA and your heart and the DASH diet. No questions or concerns noted at present. Team conference scheduled for tomorrow. Continue to follow along to discharge to address educational needs/concerns. Margarito Liner

## 2020-11-20 NOTE — Progress Notes (Signed)
Dana Henry PHYSICAL MEDICINE & REHABILITATION PROGRESS NOTE  Subjective/Complaints: Patient seen ambulating later sitting up with therapies.  She states she slept well overnight.  She denies complaints.  ROS: Denies CP, SOB, N/V/D  Objective: Vital Signs: Blood pressure (!) 97/53, pulse 70, temperature 98.1 F (36.7 C), resp. rate 14, last menstrual period 11/05/2020, SpO2 98 %, currently breastfeeding. ECHO TEE  Result Date: 11/18/2020    TRANSESOPHOGEAL ECHO REPORT   Patient Name:   Dana Henry Date of Exam: 11/18/2020 Medical Rec #:  [id number]      Height:       64.0 in Accession #:    [id number]     Weight:       180.0 lb Date of Birth:  02/21/1981     BSA:          1.871 m Patient Age:    40 years       BP:           90/66 mmHg Patient Gender: F              HR:           86 bpm. Exam Location:  Inpatient Procedure: Transesophageal Echo, 3D Echo, Color Doppler, Cardiac Doppler and            Saline Contrast Bubble Study Indications:     Stroke i163.9  History:         Patient has prior history of Echocardiogram examinations, most                  recent 11/14/2020.  Sonographer:     Raquel Sarna Senior RDCS Referring Phys:  [id number] Leanor Kail Diagnosing Phys: Cherlynn Kaiser MD PROCEDURE: After discussion of the risks and benefits of a TEE, an informed consent was obtained from the patient. TEE procedure time was 19 minutes. The transesophogeal probe was passed without difficulty through the esophogus of the patient. Imaged were obtained with the patient in a left lateral decubitus position. Local oropharyngeal anesthetic was provided with Cetacaine. Sedation performed by different physician. The patient was monitored while under deep sedation. Anesthestetic sedation was provided intravenously by Anesthesiology: 358mg  of Propofol, 80mg  of Lidocaine. Image quality was good. The patient's vital signs; including heart rate, blood pressure, and oxygen saturation; remained stable throughout the  procedure. The patient developed no complications during the procedure. IMPRESSIONS  1. Evidence of atrial level shunting detected by color flow Doppler. Agitated saline contrast bubble study was negative, could not demonstrate interatrial shunt. Probable PFO, though right to left shunting could not be provoked on this exam.  2. Left ventricular ejection fraction, by estimation, is 55 to 60%. The left ventricle has normal function.  3. Right ventricular systolic function is normal. The right ventricular size is normal.  4. No left atrial/left atrial appendage thrombus was detected.  5. The mitral valve is normal in structure. Trivial mitral valve regurgitation. No evidence of mitral stenosis.  6. The aortic valve is tricuspid. Aortic valve regurgitation is not visualized. FINDINGS  Left Ventricle: Left ventricular ejection fraction, by estimation, is 55 to 60%. The left ventricle has normal function. The left ventricular internal cavity size was normal in size. Right Ventricle: The right ventricular size is normal. No increase in right ventricular wall thickness. Right ventricular systolic function is normal. Left Atrium: Left atrial size was normal in size. No left atrial/left atrial appendage thrombus was detected. Right Atrium: Right atrial size was normal in size. Pericardium: There is no evidence  of pericardial effusion. Mitral Valve: The mitral valve is normal in structure. Trivial mitral valve regurgitation. No evidence of mitral valve stenosis. Tricuspid Valve: The tricuspid valve is normal in structure. Tricuspid valve regurgitation is trivial. No evidence of tricuspid stenosis. Aortic Valve: The aortic valve is tricuspid. Aortic valve regurgitation is not visualized. Pulmonic Valve: The pulmonic valve was grossly normal. Pulmonic valve regurgitation is trivial. Aorta: The aortic root and ascending aorta are structurally normal, with no evidence of dilitation. IAS/Shunts: Evidence of atrial level shunting  detected by color flow Doppler. Agitated saline contrast was given intravenously to evaluate for intracardiac shunting. Agitated saline contrast bubble study was negative, with no evidence of any interatrial shunt.   3D Volume EF LV 3D EDV:   95.89 ml LV 3D ESV:   48.33 ml AORTIC VALVE LVOT Vmax:   102.00 cm/s LVOT Vmean:  71.500 cm/s LVOT VTI:    0.195 m  SHUNTS Systemic VTI: 0.20 m Cherlynn Kaiser MD Electronically signed by Cherlynn Kaiser MD Signature Date/Time: 11/18/2020/12:44:09 PM    Final    Recent Labs    11/18/20 0154 11/19/20 0500  WBC 6.5 6.4  HGB 11.3* 11.8*  HCT 31.7* 34.9*  PLT 216 229   Recent Labs    11/18/20 0154 11/19/20 0500  NA 136 138  K 3.3* 3.6  CL 106 106  CO2 20* 21*  GLUCOSE 95 94  BUN 10 13  CREATININE 0.81 0.86  CALCIUM 9.0 9.1    Intake/Output Summary (Last 24 hours) at 11/20/2020 0916 Last data filed at 11/19/2020 1900 Gross per 24 hour  Intake 720 ml  Output --  Net 720 ml        Physical Exam: BP (!) 97/53 (BP Location: Right Arm)   Pulse 70   Temp 98.1 F (36.7 C)   Resp 14   LMP 11/05/2020   SpO2 98%  Constitutional: No distress . Vital signs reviewed. HENT: Normocephalic.  Atraumatic. Eyes: EOMI. No discharge. Cardiovascular: No JVD.  RRR. Respiratory: Normal effort.  No stridor.  Bilateral clear to auscultation. GI: Non-distended.  BS +. Skin: Warm and dry.  Intact. Psych: Normal mood.  Normal behavior. Musc: No edema in extremities.  No tenderness in extremities. Neuro: Alert Makes eye contact with examiner.  Follows commands.  Motor: LUE: 4/5 proximal distal LLE: 4-4+/5 proximal to distal   Assessment/Plan: 1. Functional deficits which require 3+ hours per day of interdisciplinary therapy in a comprehensive inpatient rehab setting.  Physiatrist is providing close team supervision and 24 hour management of active medical problems listed below.  Physiatrist and rehab team continue to assess barriers to discharge/monitor  patient progress toward functional and medical goals   Care Tool:  Bathing    Body parts bathed by patient: Right arm,Left arm,Chest,Abdomen,Front perineal area,Buttocks,Right upper leg,Left upper leg,Right lower leg,Left lower leg,Face         Bathing assist Assist Level: Contact Guard/Touching assist     Upper Body Dressing/Undressing Upper body dressing   What is the patient wearing?: Pull over shirt    Upper body assist Assist Level: Minimal Assistance - Patient > 75%    Lower Body Dressing/Undressing Lower body dressing      What is the patient wearing?: Pants     Lower body assist Assist for lower body dressing: Contact Guard/Touching assist     Toileting Toileting    Toileting assist Assist for toileting: Contact Guard/Touching assist     Transfers Chair/bed transfer  Transfers assist  Chair/bed transfer assist level: Contact Guard/Touching assist     Locomotion Ambulation   Ambulation assist      Assist level: Contact Guard/Touching assist Assistive device: No Device Max distance: 269ft   Walk 10 feet activity   Assist     Assist level: Contact Guard/Touching assist Assistive device: No Device   Walk 50 feet activity   Assist    Assist level: Contact Guard/Touching assist Assistive device: No Device    Walk 150 feet activity   Assist    Assist level: Contact Guard/Touching assist Assistive device: No Device    Walk 10 feet on uneven surface  activity   Assist     Assist level: Contact Guard/Touching assist Assistive device: Other (comment) (no device)   Wheelchair     Assist Will patient use wheelchair at discharge?: No             Wheelchair 50 feet with 2 turns activity    Assist            Wheelchair 150 feet activity     Assist           Medical Problem List and Plan: 1.Left side 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 Please see conference note from today as well.  2. Antithrombotics: -DVT/anticoagulation:Lovenox -antiplatelet therapy: Aspirin 81 mg daily and Brilinta 90 mg twice daily 3. Pain Management:Tylenol as needed 4. Mood:Provide emotional support -antipsychotic agents: N/A 5. Neuropsych: This patientiscapable of making decisions on herown behalf. 6. Skin/Wound Care:Routine skin checks 7. Fluids/Electrolytes/Nutrition:Routine in and outs  BMP within acceptable range on 2/1 8. Remote Covid infection 09/2020. SARS corona is negative on admission. 9. Remote tobacco abuse. Urine drug screen positive marijuana.  Counsel 10. Obesity. BMI 30.90.Dietery follow up  Encourage weight loss 11. Hypoalbuminemia  Supplement initiated on 2/1 12. Slow transit constipation  Bowel meds increased on 2/1, increased again on 2/2   LOS: 2 days A FACE TO FACE EVALUATION WAS PERFORMED  Arrow Tomko Lorie Phenix 11/20/2020, 9:16 AM

## 2020-11-20 NOTE — Progress Notes (Signed)
Speech Language Pathology Daily Session Note  Patient Details  Name: Dana Henry MRN: [id number] Date of Birth: 10-01-1981  Today's Date: 11/20/2020 SLP Individual Time: 6948-5462 SLP Individual Time Calculation (min): 30 min  Short Term Goals: Week 1: SLP Short Term Goal 1 (Week 1): STG=LTG due to short ELOS  Skilled Therapeutic Interventions:   Patient seen for skilled SLP intervention to focus on cognitive goals. Patient completed simulated insurance comparison task and required only one verbal cue to locate information. No significant errors observed. Plan is to complete task next date to simulate her job. Patient continues to benefit from skilled SLP to maximize cognitive function prior to discharge.  Pain Pain Assessment Pain Scale: 0-10 Pain Score: 0-No pain  Therapy/Group: Individual Therapy  Sonia Baller, MA, CCC-SLP Speech Therapy

## 2020-11-21 ENCOUNTER — Other Ambulatory Visit (HOSPITAL_COMMUNITY): Payer: Self-pay | Admitting: Physician Assistant

## 2020-11-21 DIAGNOSIS — D62 Acute posthemorrhagic anemia: Secondary | ICD-10-CM

## 2020-11-21 LAB — PROTHROMBIN GENE MUTATION

## 2020-11-21 MED ORDER — ATORVASTATIN CALCIUM 40 MG PO TABS: 40.0000 mg | ORAL_TABLET | Freq: Every day | ORAL | 0 refills | Status: DC

## 2020-11-21 MED ORDER — TICAGRELOR 90 MG PO TABS: 90.0000 mg | ORAL_TABLET | Freq: Two times a day (BID) | ORAL | 0 refills | Status: DC

## 2020-11-21 MED ORDER — POLYETHYLENE GLYCOL 3350 17 G PO PACK: 17.0000 g | PACK | Freq: Two times a day (BID) | ORAL | 0 refills | Status: DC

## 2020-11-21 MED ORDER — ASPIRIN 81 MG PO CHEW
81.0000 mg | CHEWABLE_TABLET | Freq: Every day | ORAL | Status: AC
Start: 2020-11-21 — End: ?

## 2020-11-21 MED ORDER — PANTOPRAZOLE SODIUM 40 MG PO TBEC
40.0000 mg | DELAYED_RELEASE_TABLET | Freq: Every day | ORAL | 0 refills | Status: DC
Start: 2020-11-21 — End: 2020-12-23

## 2020-11-21 MED ORDER — ACETAMINOPHEN 325 MG PO TABS: 650.0000 mg | ORAL_TABLET | ORAL | Status: DC | PRN

## 2020-11-21 MED FILL — BRILINTA 90 MG TABLET: 90 | 30 days supply | Qty: 60 | Fill #0

## 2020-11-21 MED FILL — PANTOPRAZOLE SOD DR 40 MG T: 40 | 30 days supply | Qty: 30 | Fill #0

## 2020-11-21 MED FILL — ATORVASTATIN CALCIUM 40 MG: 40 | 30 days supply | Qty: 30 | Fill #0

## 2020-11-21 NOTE — Discharge Instructions (Signed)
Inpatient Rehab Discharge Instructions  Dana Henry Discharge date and time: No discharge date for patient encounter.   Activities/Precautions/ Functional Status: Activity: activity as tolerated Diet: regular diet Wound Care: Routine skin checks Functional status:  ___ No restrictions     ___ Walk up steps independently ___ 24/7 supervision/assistance   ___ Walk up steps with assistance ___ Intermittent supervision/assistance  ___ Bathe/dress independently ___ Walk with walker     _x__ Bathe/dress with assistance ___ Walk Independently    ___ Shower independently ___ Walk with assistance    ___ Shower with assistance ___ No alcohol     ___ Return to work/school ________  COMMUNITY REFERRALS UPON DISCHARGE:    Outpatient: PT                  Agency:[facility] [phone]              Appointment Date/Time: Facility to Determine at Discharge   Medical Equipment/Items Ordered: NO DME                                                  Special Instructions: No driving smoking or alcohol STROKE/TIA DISCHARGE INSTRUCTIONS SMOKING Cigarette smoking nearly doubles your risk of having a stroke & is the single most alterable risk factor  If you smoke or have smoked in the last 12 months, you are advised to quit smoking for your health.  Most of the excess cardiovascular risk related to smoking disappears within a year of stopping.  Ask you doctor about anti-smoking medications  [place] Quit Line: 1-800-QUIT NOW  Free Smoking Cessation Classes [phone]  CHOLESTEROL Know your levels; limit fat & cholesterol in your diet  Lipid Panel     Component Value Date/Time   CHOL 156 11/14/2020 0549   TRIG 111 11/14/2020 0549   HDL 38 (L) 11/14/2020 0549   CHOLHDL 4.1 11/14/2020 0549   VLDL 22 11/14/2020 0549   LDLCALC 96 11/14/2020 0549      Many patients benefit from treatment even if their cholesterol is at goal.  Goal: Total Cholesterol (CHOL) less  than 160  Goal:  Triglycerides (TRIG) less than 150  Goal:  HDL greater than 40  Goal:  LDL (LDLCALC) less than 100   BLOOD PRESSURE American Stroke Association blood pressure target is less that 120/80 mm/Hg  Your discharge blood pressure is:  BP: 105/69  Monitor your blood pressure  Limit your salt and alcohol intake  Many individuals will require more than one medication for high blood pressure  DIABETES (A1c is a blood sugar average for last 3 months) Goal HGBA1c is under 7% (HBGA1c is blood sugar average for last 3 months)  Diabetes: No known diagnosis of diabetes    Lab Results  Component Value Date   HGBA1C 5.1 11/14/2020     Your HGBA1c can be lowered with medications, healthy diet, and exercise.  Check your blood sugar as directed by your physician  Call your physician if you experience unexplained or low blood sugars.  PHYSICAL ACTIVITY/REHABILITATION Goal is 30 minutes at least 4 days per week  Activity: Increase activity slowly, Therapies: Physical Therapy: Home Health Return to work:   Activity decreases your risk of heart attack and stroke and makes your heart stronger.  It helps control your weight and blood pressure; helps you  relax and can improve your mood.  Participate in a regular exercise program.  Talk with your doctor about the best form of exercise for you (dancing, walking, swimming, cycling).  DIET/WEIGHT Goal is to maintain a healthy weight  Your discharge diet is:  Diet Order            Diet Heart Room service appropriate? Yes; Fluid consistency: Thin  Diet effective now                 liquids Your height is:    Your current weight is:   Your Body Mass Index (BMI) is:     Following the type of diet specifically designed for you will help prevent another stroke.  Your goal weight range is:    Your goal Body Mass Index (BMI) is 19-24.  Healthy food habits can help reduce 3 risk factors for stroke:  High cholesterol, hypertension, and  excess weight.  RESOURCES Stroke/Support Group:  Call [phone]   STROKE EDUCATION PROVIDED/REVIEWED AND GIVEN TO PATIENT Stroke warning signs and symptoms How to activate emergency medical system (call 911). Medications prescribed at discharge. Need for follow-up after discharge. Personal risk factors for stroke. Pneumonia vaccine given:  Flu vaccine given:  My questions have been answered, the writing is legible, and I understand these instructions.  I will adhere to these goals & educational materials that have been provided to me after my discharge from the hospital.      My questions have been answered and I understand these instructions. I will adhere to these goals and the provided educational materials after my discharge from the hospital.  Patient/Caregiver Signature _______________________________ Date __________  Clinician Signature _______________________________________ Date __________  Please bring this form and your medication list with you to all your follow-up doctor's appointments.

## 2020-11-21 NOTE — Progress Notes (Signed)
Occupational Therapy Discharge Summary  Patient Details  Name: Dana Henry MRN: [id number] Date of Birth: [birth year]  Today's Date: 11/21/2020 OT Individual Time: 1307-1400 OT Individual Time Calculation (min): 53 min    Patient has met 9 of 9 long term goals due to improved activity tolerance, improved balance, ability to compensate for deficits, functional use of  RIGHT upper and RIGHT lower extremity and improved coordination.  Patient to discharge at overall Modified Independent level for BADLs and intermittent supervision for IADLs.  Patient's care partner is independent to provide the necessary cognitive assistance at discharge.    Recommendation:  Patient will benefit from ongoing skilled OT services in outpatient setting to continue to advance functional skills in the area of iADL and neuro re-education RUE and strengthening..  Equipment: No equipment provided  Reasons for discharge: treatment goals met and discharge from hospital   Skilled Intervention: Pt sitting up in w/c, husband present for caregiver education.  Instructed pt on HEP including shoulder scaption, sidelying ER, biceps and triceps curls using free weights, ball toss/catch/bounce, min resistive putty pinch and gross grip.  Pt completed with mod I 1 set of each during session.  Educated husband regarding pts current functional status of mod I for basic ADLs and supervision for cooking and household chores.  Educated pt and husband regarding safe shower transfer stepping over 2 inch threshold and pt able to return demonstrate through simulation using wooden threshold frame with mod I.  Pt seated in w/c at end of session, call bell in reach.  Patient/family agrees with progress made and goals achieved: Yes  OT Discharge Precautions/Restrictions  Precautions Precautions: Fall Precaution Comments: left sided weakness (UE>LE) Restrictions Weight Bearing Restrictions: No Pain Pain Assessment Pain Scale: 0-10 Pain  Score: 0-No pain ADL ADL Eating: Independent Where Assessed-Eating: Wheelchair Grooming: Modified independent Where Assessed-Grooming: Sitting at sink Upper Body Bathing: Modified independent Where Assessed-Upper Body Bathing: Shower (sitting on shower seat) Lower Body Bathing: Modified independent Where Assessed-Lower Body Bathing: Shower (shower seat) Upper Body Dressing: Modified independent (Device) Where Assessed-Upper Body Dressing: Chair Lower Body Dressing: Modified independent Where Assessed-Lower Body Dressing: Chair Toileting: Modified independent Where Assessed-Toileting: Glass blower/designer: Diplomatic Services operational officer Method: Magazine features editor: Close supervision Social research officer, government Method: Heritage manager: Radio broadcast assistant Vision Baseline Vision/History: Wears glasses Wears Glasses: At all times Patient Visual Report: Blurring of vision Vision Assessment?: No apparent visual deficits Visual Fields: No apparent deficits Perception  Perception: Within Functional Limits Praxis Praxis: Intact Cognition Overall Cognitive Status: Within Functional Limits for tasks assessed Arousal/Alertness: Awake/alert Orientation Level: Oriented X4 Divided Attention: Appears intact Memory: Appears intact Awareness: Appears intact Problem Solving: Impaired Problem Solving Impairment: Functional basic;Functional complex Safety/Judgment: Appears intact Sensation Sensation Light Touch: Appears Intact Hot/Cold: Appears Intact Proprioception: Appears Intact Stereognosis: Not tested Coordination Gross Motor Movements are Fluid and Coordinated: Yes Fine Motor Movements are Fluid and Coordinated: No Coordination and Movement Description: No dysmetria or dyscoordination but impaired movement patterns with LUE 2/2 hemiparesis. LLE WFL Finger Nose Finger Test: slow and labored with decreased precision LUE; WNL RUE; improved since  IE Motor  Motor Motor: Hemiplegia Motor - Discharge Observations: Left sided hemi (UE>LE weakness). Much improved since date of evaluation Mobility  Bed Mobility Bed Mobility: Rolling Right;Rolling Left;Supine to Sit;Sit to Supine Rolling Right: Independent Rolling Left: Independent Supine to Sit: Independent Sit to Supine: Independent Transfers Sit to Stand: Independent Stand to Sit: Independent  Trunk/Postural Assessment  Cervical Assessment Cervical Assessment:  Within Functional Limits Thoracic Assessment Thoracic Assessment: Within Functional Limits Lumbar Assessment Lumbar Assessment: Within Functional Limits Postural Control Postural Control: Within Functional Limits  Balance Balance Balance Assessed: Yes Static Sitting Balance Static Sitting - Balance Support: No upper extremity supported Static Sitting - Level of Assistance: 7: Independent Dynamic Sitting Balance Dynamic Sitting - Balance Support: No upper extremity supported Dynamic Sitting - Level of Assistance: 7: Independent Static Standing Balance Static Standing - Balance Support: No upper extremity supported Static Standing - Level of Assistance: 7: Independent Dynamic Standing Balance Dynamic Standing - Balance Support: Left upper extremity supported Dynamic Standing - Level of Assistance: 6: Modified independent (Device/Increase time) Extremity/Trunk Assessment RUE Assessment RUE Assessment: Within Functional Limits LUE Assessment LUE Assessment: Exceptions to [facility] Passive Range of Motion (PROM) Comments: Darryll Capers   Caryl Asp Delila Kuklinski 11/21/2020, 9:51 AM

## 2020-11-21 NOTE — Progress Notes (Signed)
[facility] PHYSICAL MEDICINE & REHABILITATION PROGRESS NOTE  Subjective/Complaints: Patient continues in bed this morning.  She states she slept well overnight.  She is question regarding prognosis and her menstrual cycle in correlation with anticoagulation.  ROS: Denies CP, SOB, N/V/D  Objective: Vital Signs: Blood pressure 103/73, pulse 66, temperature 98 F (36.7 C), resp. rate 16, last menstrual period 11/05/2020, SpO2 99 %, currently breastfeeding. No results found. Recent Labs    11/19/20 0500  WBC 6.4  HGB 11.8*  HCT 34.9*  PLT 229   Recent Labs    11/19/20 0500  NA 138  K 3.6  CL 106  CO2 21*  GLUCOSE 94  BUN 13  CREATININE 0.86  CALCIUM 9.1    Intake/Output Summary (Last 24 hours) at 11/21/2020 0940 Last data filed at 11/20/2020 1752 Gross per 24 hour  Intake 720 ml  Output --  Net 720 ml        Physical Exam: BP 103/73   Pulse 66   Temp 98 F (36.7 C)   Resp 16   LMP 11/05/2020   SpO2 99%  Constitutional: No distress . Vital signs reviewed. HENT: Normocephalic.  Atraumatic. Eyes: EOMI. No discharge. Cardiovascular: No JVD.  RRR. Respiratory: Normal effort.  No stridor.  Bilateral clear to auscultation. GI: Non-distended.  BS +. Skin: Warm and dry.  Intact. Psych: Normal mood.  Normal behavior. Musc: No edema in extremities.  No tenderness in extremities. Neuro: Alert Makes eye contact with examiner.  Follows commands.  Motor: LUE: 4/5 proximal distal, improving LLE: 4-4+/5 proximal to distal, improving  Assessment/Plan: 1. Functional deficits which require 3+ hours per day of interdisciplinary therapy in a comprehensive inpatient rehab setting.  Physiatrist is providing close team supervision and 24 hour management of active medical problems listed below.  Physiatrist and rehab team continue to assess barriers to discharge/monitor patient progress toward functional and medical goals   Care Tool:  Bathing    Body parts bathed by  patient: Right arm,Left arm,Chest,Abdomen,Front perineal area,Buttocks,Right upper leg,Left upper leg,Right lower leg,Left lower leg,Face         Bathing assist Assist Level: Supervision/Verbal cueing     Upper Body Dressing/Undressing Upper body dressing   What is the patient wearing?: Pull over shirt    Upper body assist Assist Level: Supervision/Verbal cueing    Lower Body Dressing/Undressing Lower body dressing      What is the patient wearing?: Pants,Underwear/pull up     Lower body assist Assist for lower body dressing: Independent     Toileting Toileting    Toileting assist Assist for toileting: Independent     Transfers Chair/bed transfer  Transfers assist     Chair/bed transfer assist level: Contact Guard/Touching assist     Locomotion Ambulation   Ambulation assist      Assist level: Contact Guard/Touching assist Assistive device: No Device Max distance: 280ft   Walk 10 feet activity   Assist     Assist level: Contact Guard/Touching assist Assistive device: No Device   Walk 50 feet activity   Assist    Assist level: Contact Guard/Touching assist Assistive device: No Device    Walk 150 feet activity   Assist    Assist level: Contact Guard/Touching assist Assistive device: No Device    Walk 10 feet on uneven surface  activity   Assist     Assist level: Contact Guard/Touching assist Assistive device: Other (comment) (no device)   Wheelchair     Assist Will patient  use wheelchair at discharge?: No             Wheelchair 50 feet with 2 turns activity    Assist            Wheelchair 150 feet activity     Assist           Medical Problem List and Plan: 1.Left side 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  Antithrombotics: -DVT/anticoagulation:Lovenox -antiplatelet therapy: Aspirin 81 mg daily and Brilinta 90 mg twice daily 3. Pain Management:Tylenol as needed 4. Mood:Provide emotional support -antipsychotic agents: N/A 5. Neuropsych: This patientiscapable of making decisions on herown behalf. 6. Skin/Wound Care:Routine skin checks 7. Fluids/Electrolytes/Nutrition:Routine in and outs  BMP within acceptable range on 2/1 8. Remote Covid infection 09/2020. SARS corona is negative on admission. 9. Remote tobacco abuse. Urine drug screen positive marijuana.  Counsel-discussed again 10. Obesity. BMI 30.90.Dietery follow up  Encourage weight loss 11. Hypoalbuminemia  Supplement initiated on 2/1 12. Slow transit constipation  Bowel meds increased on 2/1, increased again on 2/2  Improving 13.  Acute blood loss anemia  Hemoglobin 11.8 on 2/1  Continue to monitor   LOS: 3 days A FACE TO FACE EVALUATION WAS PERFORMED  Brytani Voth Lorie Phenix 11/21/2020, 9:40 AM

## 2020-11-21 NOTE — Progress Notes (Signed)
Speech Language Pathology Discharge Summary  Patient Details  Name: Dana Henry MRN: [id number] Date of Birth: Jun 14, 1981  Today's Date: 11/21/2020 SLP Individual Time: 1115-1200 SLP Individual Time Calculation (min): 45 min   Skilled Therapeutic Interventions:  Patient seen for skilled ST session with focus on simulated work task Building services engineer) and patient/family education. Patient able to complete simulated work task without difficulty and with 100% accuracy. SLP discussed with patient and later on, spouse, that suspect her main difficulties with cognitive processing will be in fatigue (mental, physical) and recommended rest breaks, time frames to complete tasks, etc.    Patient has met 3 of 3 long term goals.  Patient to discharge at overall Modified Independent level.  Reasons goals not met: N/A   Clinical Impression/Discharge Summary: Patient made great progress during short term CIR stay and met 3/3 LTG"s for SLP intervention. She is currently modified independent with complex level reasoning, attention and cognitive processing. She was able to complete simulated spread sheet task (based on tasks from her work) with 100% accuracy and in reasonable amount of time. Patient does not require further SLP intervention at this time.  Care Partner:  Caregiver Able to Provide Assistance: Yes  Type of Caregiver Assistance: Physical;Cognitive  Recommendation:  None      Equipment: None   Reasons for discharge: Discharged from hospital      Sonia Baller, [place], [facility]

## 2020-11-21 NOTE — Discharge Summary (Signed)
Physical Therapy Discharge Summary  Patient Details  Name: Dana Henry MRN: [id number] Date of Birth: 1981/05/03  Today's Date: 11/21/2020 PT Individual Time: 1030-1100 + 1400-1455 PT Individual Time Calculation (min): 30 min  + 55 min   Patient has met 10 of 10 long term goals due to improved activity tolerance, improved balance, increased strength, ability to compensate for deficits, functional use of  left upper extremity and left lower extremity and improved coordination.  Patient to discharge at an ambulatory level Independent.   Patient's care partner is independent to provide the necessary cognitive assistance at discharge.  Reasons goals not met: n/a  Recommendation:  Patient will benefit from ongoing skilled PT services in outpatient setting to continue to advance safe functional mobility, address ongoing impairments in LUE > LLE weakness, higher level balance and gait deficits in order to improve quality of life and minimize fall risk.  Equipment: No equipment provided  Reasons for discharge: treatment goals met and discharge from hospital  Patient/family agrees with progress made and goals achieved: Yes  PT Discharge Precautions/Restrictions Precautions Precautions: Fall Precaution Comments: left sided weakness (UE>LE) Restrictions Weight Bearing Restrictions: No Vital Signs Therapy Vitals Temp: 98 F (36.7 C) Pulse Rate: 66 Resp: 16 BP: 103/73 Patient Position (if appropriate): Lying Oxygen Therapy SpO2: 99 % Pain    No pain reported Vision/Perception  Perception Perception: Within Functional Limits Praxis Praxis: Intact  Cognition Overall Cognitive Status: Within Functional Limits for tasks assessed Arousal/Alertness: Awake/alert Orientation Level: Oriented X4 Problem Solving: Appears intact Problem Solving Impairment: Verbal complex;Functional complex Safety/Judgment: Appears intact Sensation Sensation Light Touch: Appears Intact Hot/Cold:  Appears Intact Proprioception: Appears Intact Stereognosis: Appears Intact Coordination Gross Motor Movements are Fluid and Coordinated: Yes Fine Motor Movements are Fluid and Coordinated: No Coordination and Movement Description: No dysmetria or dyscoordination but impaired movement patterns with LUE 2/2 hemiparesis. LLE WFL Motor  Motor Motor: Hemiplegia Motor - Discharge Observations: Left sided hemi (UE>LE weakness). Much improved since date of evaluation  Mobility Bed Mobility Bed Mobility: Rolling Right;Rolling Left;Supine to Sit;Sit to Supine Rolling Right: Independent Rolling Left: Independent Supine to Sit: Independent Sit to Supine: Independent Transfers Transfers: Sit to Stand;Stand to Sit;Stand Pivot Transfers Sit to Stand: Independent Stand to Sit: Independent Stand Pivot Transfers: Independent Transfer (Assistive device): None Locomotion  Gait Ambulation: Yes Gait Assistance: Independent Gait Distance (Feet): 1000 Feet Assistive device: None Gait Gait: Yes Gait Pattern: Within Functional Limits Gait Pattern: Step-through pattern;Decreased step length - left;Decreased stance time - left;Wide base of support Gait velocity: decr Stairs / Additional Locomotion Stairs: Yes Stairs Assistance: Independent Stair Management Technique: One rail Right (alternating for ascent, step-to for descent) Number of Stairs: 12 Height of Stairs: 6 Ramp: Independent Wheelchair Mobility Wheelchair Mobility: No  Trunk/Postural Assessment  Cervical Assessment Cervical Assessment: Within Functional Limits Thoracic Assessment Thoracic Assessment: Within Functional Limits Lumbar Assessment Lumbar Assessment: Within Functional Limits Postural Control Postural Control: Within Functional Limits  Balance Balance Balance Assessed: Yes Standardized Balance Assessment Standardized Balance Assessment: Berg Balance Test Berg Balance Test Sit to Stand: Able to stand without using  hands and stabilize independently Standing Unsupported: Able to stand safely 2 minutes Sitting with Back Unsupported but Feet Supported on Floor or Stool: Able to sit safely and securely 2 minutes Stand to Sit: Sits safely with minimal use of hands Transfers: Able to transfer safely, minor use of hands Standing Unsupported with Eyes Closed: Able to stand 10 seconds safely Standing Ubsupported with Feet Together: Able to place feet  together independently and stand 1 minute safely From Standing, Reach Forward with Outstretched Arm: Can reach confidently >25 cm (10") From Standing Position, Pick up Object from Floor: Able to pick up shoe safely and easily From Standing Position, Turn to Look Behind Over each Shoulder: Looks behind from both sides and weight shifts well Turn 360 Degrees: Able to turn 360 degrees safely in 4 seconds or less Standing Unsupported, Alternately Place Feet on Step/Stool: Able to stand independently and safely and complete 8 steps in 20 seconds Standing Unsupported, One Foot in Front: Able to place foot tandem independently and hold 30 seconds Standing on One Leg: Able to lift leg independently and hold > 10 seconds Total Score: 56 Static Sitting Balance Static Sitting - Balance Support: No upper extremity supported Static Sitting - Level of Assistance: 7: Independent Dynamic Sitting Balance Dynamic Sitting - Balance Support: No upper extremity supported Dynamic Sitting - Level of Assistance: 7: Independent Static Standing Balance Static Standing - Balance Support: No upper extremity supported Static Standing - Level of Assistance: 7: Independent Dynamic Standing Balance Dynamic Standing - Balance Support: Left upper extremity supported Dynamic Standing - Level of Assistance: 6: Modified independent (Device/Increase time)  Extremity Assessment      RLE Assessment RLE Assessment: Within Functional Limits LLE Assessment LLE Assessment: Within Functional  Limits General Strength Comments: Grossly 4+/5  Skilled intervention:  1st session: Pt greeted seated upright in recliner, awake and motivated to participate in therapy. No c/o pain. Sit<>stand indep from recliner and ambulated from her room to main therapy gym indep, ~~150f. Performed BERG with details listed above. Scoring 56/56 indicating lower falls risk. Updated patients sign in room indicating pt is indep and free to ambulate in her room unsupervised. RN made aware.   Focused remainder of session on LUE strengthening, performed the following there-ex: -1x15 bicep curls with 4# dumbbell -1x10 forward punches/jabs with 4# dumbbell -1x10 sup/pronation with 4# dumbbell -1x10 radial/ulnar deviation, unweighted -1x10 #4 dumbbell shoulder flys -1x10 isometric holds with elbow at 90 deg and 4# dumbbell  Ambulated back to her room indep and ended session seated in recliner. Needs in reach  Patient demonstrates increased fall risk as noted by score of   56/56 on Berg Balance Scale.  (<36= high risk for falls, close to 100%; 37-45 significant >80%; 46-51 moderate >50%; 52-55 lower >25%)  2nd session: Pt greeted seated in recliner, agreeable to therapy. Reports no pain pain. Husband at bedside and focus of session on scheduled family education/training. Discussion held with patient and husband regarding home safety, role of f/u therapies, stroke recovery. All questions and concerns addressed. With husband present, patient demonstrated ability to ambulate >5077findep with no AD, perform car transfer indep without difficulty, and navigate up/down x12 steps indep with 1 hand rail support. No LOB's or knee buckling noted. Husband appreciative and they both feel prepared and comfortable with upcoming DC. Spent remaining time focusing on HEP which was listed below. Provided her with level 3 theraband to complete some of the exercises and we reviewed all of them. Pt ended session seated in recliner, with  needs in reach.   HEP developed (see below) and hand-out provided to patient. Reviewed exercises with her and she voiced good understanding and demonstration of techniques. All questions and concerns addressed.  Access Code: [email or web address]: [email or web address] Date: 11/21/2020 Prepared by: ChGinnie SmartExercises Standing Bicep Curls Supinated with Dumbbells - 1 x daily - 7 x weekly - 3 sets - 10 reps  Standing Bicep Curls Neutral with Dumbbells - 1 x daily - 7 x weekly - 3 sets - 10 reps Standing Shoulder Abduction with Bent Elbow with Dumbbell - 1 x daily - 7 x weekly - 3 sets - 10 reps Scapular Protraction with Dumbbells - 1 x daily - 7 x weekly - 3 sets - 10 reps Standing Row with Anchored Resistance - 1 x daily - 7 x weekly - 3 sets - 10 reps Single Arm Punch with Resistance - 1 x daily - 7 x weekly - 3 sets - 10 reps  Access Code: [email or web address] URL: [email or web address] Date: 11/21/2020 Prepared by: Ginnie Smart  Exercises Side Stepping with Resistance at Thighs - 1 x daily - 7 x weekly - 3 sets - 10 reps Squat - 1 x daily - 7 x weekly - 3 sets - 10 reps Tandem Walking - 1 x daily - 7 x weekly - 3 sets - 10 reps Backward Tandem Walking with Counter Support - 1 x daily - 7 x weekly - 3 sets - 10 reps Goblet Squat with Kettlebell - 1 x daily - 7 x weekly - 3 sets - 10 reps   [employer] PT, DPT 11/21/2020, 7:44 AM

## 2020-11-21 NOTE — Progress Notes (Signed)
Inpatient Rehabilitation Care Coordinator Discharge Note  The overall goal for the admission was met for:   Discharge location: Yes, home   Length of Stay: Yes, 4 Days  Discharge activity level: Yes  Home/community participation: Yes  Services provided included: MD, RD, PT, OT, SLP, RN, CM, TR, Pharmacy, Neuropsych and SW  Financial Services: Private Insurance: [place] offered to/list presented PP:IRJJOAC   Follow-up services arranged: Outpatient: [facility] [place]  Comments (or additional information): PT   Patient/Family verbalized understanding of follow-up arrangements: Yes  Individual responsible for coordination of the follow-up plan: self, [phone]  Confirmed correct DME delivered: Dyanne Iha 11/21/2020    Dyanne Iha

## 2020-11-22 LAB — PROTHROMBIN GENE MUTATION

## 2020-11-22 NOTE — Progress Notes (Signed)
Pt left per wheelchair to private vehicle with all belongings. No complications noted.  Sheela Stack, LPN

## 2020-11-22 NOTE — Progress Notes (Signed)
PA Dan in to discuss discharge instructions with pt. Pt in agreement. No further questions. Belongings gathered.  Sheela Stack, LPN

## 2020-11-22 NOTE — Progress Notes (Signed)
[place] PHYSICAL MEDICINE & REHABILITATION PROGRESS NOTE  Subjective/Complaints: Patient seen laying in bed this morning.  She states she slept well overnight.  She states she is ready for discharge.  ROS: Denies CP, SOB, N/V/D  Objective: Vital Signs: Blood pressure 108/64, pulse 79, temperature 97.7 F (36.5 C), temperature source Oral, resp. rate 16, last menstrual period 11/05/2020, SpO2 100 %, currently breastfeeding. No results found. No results for input(s): WBC, HGB, HCT, PLT in the last 72 hours. No results for input(s): NA, K, CL, CO2, GLUCOSE, BUN, CREATININE, CALCIUM in the last 72 hours.  Intake/Output Summary (Last 24 hours) at 11/22/2020 1041 Last data filed at 11/21/2020 1300 Gross per 24 hour  Intake 240 ml  Output --  Net 240 ml        Physical Exam: BP 108/64 (BP Location: Right Arm)   Pulse 79   Temp 97.7 F (36.5 C) (Oral)   Resp 16   LMP 11/05/2020   SpO2 100%  Constitutional: No distress . Vital signs reviewed. HENT: Normocephalic.  Atraumatic. Eyes: EOMI. No discharge. Cardiovascular: No JVD.  RRR. Respiratory: Normal effort.  No stridor.  Bilateral clear to auscultation. GI: Non-distended.  BS +. Skin: Warm and dry.  Intact. Psych: Normal mood.  Normal behavior. Musc: No edema in extremities.  No tenderness in extremities. Neuro: Alert Makes eye contact with examiner.  Follows commands.  Motor: LUE: 4/5 proximal distal, improving LLE: 4-4+/5 proximal to distal, improving  Assessment/Plan: 1. Functional deficits which require 3+ hours per day of interdisciplinary therapy in a comprehensive inpatient rehab setting.  Physiatrist is providing close team supervision and 24 hour management of active medical problems listed below.  Physiatrist and rehab team continue to assess barriers to discharge/monitor patient progress toward functional and medical goals   Care Tool:  Bathing    Body parts bathed by patient: Right arm,Left  arm,Chest,Abdomen,Front perineal area,Buttocks,Right upper leg,Left upper leg,Right lower leg,Left lower leg,Face         Bathing assist Assist Level: Supervision/Verbal cueing     Upper Body Dressing/Undressing Upper body dressing   What is the patient wearing?: Pull over shirt    Upper body assist Assist Level: Supervision/Verbal cueing    Lower Body Dressing/Undressing Lower body dressing      What is the patient wearing?: Underwear/pull up,Pants     Lower body assist Assist for lower body dressing: Independent     Toileting Toileting    Toileting assist Assist for toileting: Independent     Transfers Chair/bed transfer  Transfers assist     Chair/bed transfer assist level: Independent     Locomotion Ambulation   Ambulation assist      Assist level: Independent Assistive device: No Device Max distance: 1040ft   Walk 10 feet activity   Assist     Assist level: Independent Assistive device: No Device   Walk 50 feet activity   Assist    Assist level: Independent Assistive device: No Device    Walk 150 feet activity   Assist    Assist level: Independent Assistive device: No Device    Walk 10 feet on uneven surface  activity   Assist     Assist level: Independent Assistive device: Other (comment) (no device)   Wheelchair     Assist Will patient use wheelchair at discharge?: No             Wheelchair 50 feet with 2 turns activity    Assist  Wheelchair 150 feet activity     Assist           Medical Problem List and Plan: 1.Left side hemiparesis secondary to acute ischemic right MCA infarction with emergent stenting angioplasty right ICA per interventional radiology  DC today  Will see patient for hospital follow-up in 1 month post-discharge 2. Antithrombotics: -DVT/anticoagulation:Lovenox -antiplatelet therapy: Aspirin 81 mg daily and Brilinta 90 mg twice daily 3.  Pain Management:Tylenol as needed 4. Mood:Provide emotional support -antipsychotic agents: N/A 5. Neuropsych: This patientiscapable of making decisions on herown behalf. 6. Skin/Wound Care:Routine skin checks 7. Fluids/Electrolytes/Nutrition:Routine in and outs  BMP within acceptable range on 2/1 8. Remote Covid infection 09/2020. SARS corona is negative on admission. 9. Remote tobacco abuse. Urine drug screen positive marijuana.  Counsel-discussed again 10. Obesity. BMI 30.90.Dietery follow up  Encourage weight loss 11. Hypoalbuminemia  Supplement initiated on 2/1 12. Slow transit constipation  Bowel meds increased on 2/1, increased again on 2/2  Improved 13.  Acute blood loss anemia  Hemoglobin 11.8 on 2/1  Continue to monitor  > 30 minutes spent in total in discharge planning between myself and PA regarding aforementioned, as well discussion regarding DME equipment, follow-up appointments, follow-up therapies, discharge medications, discharge recommendations, answering questions.  Please see discharge summary as well.  LOS: 4 days A FACE TO FACE EVALUATION WAS PERFORMED  Donise Woodle Lorie Phenix 11/22/2020, 10:41 AM

## 2020-11-25 ENCOUNTER — Telehealth (HOSPITAL_COMMUNITY): Payer: Self-pay

## 2020-11-25 ENCOUNTER — Encounter: Payer: Self-pay | Admitting: Family Medicine

## 2020-11-25 NOTE — Telephone Encounter (Signed)
Called to schedule f/u, no answer, vm full. AW  

## 2020-11-26 MED ORDER — FLUOXETINE HCL 20 MG PO TABS: 20.0000 mg | ORAL_TABLET | Freq: Every day | ORAL | 5 refills | Status: DC

## 2020-11-26 NOTE — Addendum Note (Signed)
Addended by: Lamar Blinks C on: 11/26/2020 08:55 AM   Modules accepted: Orders

## 2020-11-27 ENCOUNTER — Telehealth: Payer: Self-pay

## 2020-11-27 NOTE — Telephone Encounter (Signed)
Transition Care Management Unsuccessful Follow-up Telephone Call  Date of discharge and from where:  11/18/20 [facility]  Attempts:  1st Attempt no answer at 2:59 pm, no return call  Reason for unsuccessful TCM follow-up call:  Left voice message

## 2020-11-29 ENCOUNTER — Encounter: Payer: Self-pay | Admitting: Registered Nurse

## 2020-11-29 ENCOUNTER — Encounter: Payer: BC Managed Care – PPO | Attending: Registered Nurse | Admitting: Registered Nurse

## 2020-11-29 ENCOUNTER — Other Ambulatory Visit: Payer: Self-pay

## 2020-11-29 VITALS — BP 107/63 | HR 77 | Temp 97.9°F | Ht 64.0 in | Wt 179.0 lb

## 2020-11-29 DIAGNOSIS — G8194 Hemiplegia, unspecified affecting left nondominant side: Secondary | ICD-10-CM | POA: Diagnosis not present

## 2020-11-29 DIAGNOSIS — I63511 Cerebral infarction due to unspecified occlusion or stenosis of right middle cerebral artery: Secondary | ICD-10-CM | POA: Diagnosis not present

## 2020-11-29 MED ORDER — TICAGRELOR 90 MG PO TABS: 90.0000 mg | ORAL_TABLET | Freq: Two times a day (BID) | ORAL | 0 refills | Status: DC

## 2020-11-29 NOTE — Progress Notes (Signed)
Subjective:    Patient ID: Dana Henry, female    DOB: [birth year], 40 y.o.   MRN: [id number]  HPI: Dana Henry is a 40 y.o. female who is here for hospital follow up appointment of her  Right Middle- Cerebral Artery Stroke, Left Hemiparesis and Marijuana Abuse.  Dana Henry presented to [facility] on 11/13/2020, via EMS with complaints of left-sided weakness. She has a history of COVID infection on 09/19/2020. Neurology Consulted.  CT Head WO Contrast:  IMPRESSION: No acute intracranial hemorrhage. Acute infarction involving the right caudate, adjacent white matter, and superior lentiform nucleus. ASPECTS is 7. CT Angio Neck WO Contrast:  ADDENDUM: Transcription error in the impression. Should state occlusion or high-grade stenosis right petrous ICA. MR Brain WO Contrast:  IMPRESSION: MRI HEAD IMPRESSION:  1. 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 No associated hemorrhage or significant mass effect. 2. Otherwise normal brain MRI for age.  MRA HEAD IMPRESSION:  1. Sequelae of recent stent placement within the petrous right ICA. Evaluation for implant stenosis limited by susceptibility artifact, however, widely patent flow seen distally within the right carotid siphon. 2. No other hemodynamically significant stenosis or other vascular abnormality within the intracranial circulation. 3. Fetal type origin of both PCAs with overall diminutive vertebrobasilar system.  Per Dr Leonie Man Discharge Summary:  Discharge Diagnoses:  1. 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 3.  Left hemiparesis 4.  Small  PFO likely incidental  Dana Henry was maintained on aspirin and Brillinta for CVA prophylaxis.   Dana Henry was admitted to inpatient rehabilitation on 11/18/2020, and discharged home on 11/22/2020. She has an appointment with outpatient rehabilitation on Monday 12/02/2020. She denies any pain at this time. She rates her pain 0. Also reports she has a good appetite.    Pain Inventory Average Pain 0 Pain Right Now 0 My pain is NO PAIN  LOCATION OF PAIN  LEFT LEG FALLS ASLEEP  BOWEL Number of stools per week: 5-7 Oral laxative use No  Type of laxative NONE Enema or suppository use No  History of colostomy No  Incontinent No   BLADDER Normal In and out cath, frequency N/A Able to self cath No  Bladder incontinence No  Frequent urination No  Leakage with coughing No  Difficulty starting stream No  Incomplete bladder emptying No    Mobility how many minutes can you walk? No problem walking. ability to climb steps?  yes do you drive?  no  Function employed # of hrs/week Medical leave since 11/11/2020.  I need assistance with the following:  meal prep, household duties, shopping and Mother helps out. Do you have any goals in this area?  yes  Neuro/Psych weakness anxiety  Prior Studies Any changes since last visit?  no  Physicians involved in your care Any changes since last visit?  no NEW PATIENT   Family History  Problem Relation Age of Onset  . Hypertension Mother   . Alcohol abuse Father    Social History   Socioeconomic History  . Marital status: Married    Spouse name: Tomekia Helton  . Number of children: Not on file  . Years of education: College  . Highest  education level: Not on file  Occupational History  . Occupation: Press photographer  Tobacco Use  . Smoking status: Former Smoker    Packs/day: 0.50    Types: Cigarettes    Quit date: 10/19/2013    Years since quitting: 7.1  . Smokeless tobacco: Never Used  Substance and Sexual Activity  . Alcohol use:  Yes    Comment: occassional  . Drug use: No  . Sexual activity: Yes    Birth control/protection: None  Other Topics Concern  . Not on file  Social History Narrative   Lives in [place] with husband and mother and father and mother in [employer]. Daughter 75months, Huel [employer].      Work - Manufacturing systems engineer in [employer]      Diet - regular   Exercise - occasional walks      Caffinated Beverages: Yes   Herbal Remedies: No   Seat Belts: Yes   Bike Helmet: Yes   Exercise 3 Times a Week: No   Vegetarian: No   Eat Dairy Products:   Take Vitamins: Yes   Use Hearing Aid: No   Wear Dentures: No   Smoke Alarms in Home: Yes   Guns/Firearms: No   Physical Abuse: No      Hours of Sleep: 6-7   # of People in Home: 3               Social Determinants of Health   Financial Resource Strain: Not on file  Food Insecurity: Not on file  Transportation Needs: Not on file  Physical Activity: Not on file  Stress: Not on file  Social Connections: Not on file   Past Surgical History:  Procedure Laterality Date  . BUBBLE STUDY  11/18/2020   Procedure: BUBBLE STUDY;  Surgeon: Elouise Munroe, MD;  Location: [place];  Service: Cardiology;;  . CESAREAN SECTION N/A 07/21/2014   Procedure: CESAREAN SECTION;  Surgeon: Elveria Royals, MD;  Location: [facility];  Service: Obstetrics;  Laterality: N/A;  . CESAREAN SECTION N/A 08/10/2017   Procedure: Repeat CESAREAN SECTION;  Surgeon: Azucena Fallen, MD;  Location: [place];  Service: Obstetrics;  Laterality: N/A;  EDD: 08/17/17 Allergy: Sulfa  . IR CT HEAD LTD  11/13/2020  . IR INTRA CRAN STENT  11/13/2020  . IR PERCUTANEOUS ART THROMBECTOMY/INFUSION INTRACRANIAL INC DIAG ANGIO  11/13/2020  . KIDNEY STONE SURGERY    . RADIOLOGY WITH ANESTHESIA N/A 11/13/2020   Procedure: IR WITH ANESTHESIA;  Surgeon: Radiologist, Medication, MD;  Location: [facility];  Service: Radiology;  Laterality: N/A;  . TEE WITHOUT CARDIOVERSION N/A 11/18/2020   Procedure:  TRANSESOPHAGEAL ECHOCARDIOGRAM (TEE);  Surgeon: Elouise Munroe, MD;  Location: [facility];  Service: Cardiology;  Laterality: N/A;  . TONSILLECTOMY AND ADENOIDECTOMY  1991   Past Medical History:  Diagnosis Date  . Anemia   . History of cesarean delivery affecting pregnancy 08/10/2017  . Kidney stones    surgically removed at 40YO and 40YO  . Migraines    associated with menstrual cycle  . UTI (urinary tract infection)   . Vaginal Pap smear, abnormal    LMP 11/05/2020   Opioid Risk Score:   Fall Risk Score:  `1  Depression screen PHQ 2/9  Depression screen [facility] 2/9 03/11/2018 10/06/2012  Decreased Interest 0 0  Down, Depressed, Hopeless 0 0  PHQ - 2 Score 0 0   Review of Systems  Neurological: Positive for weakness.  Psychiatric/Behavioral:       ANXIETY  All other systems reviewed and are negative.      Objective:   Physical Exam Vitals and nursing note reviewed.  Constitutional:      Appearance: Normal appearance.  Cardiovascular:     Rate and Rhythm: Normal rate and regular rhythm.     Pulses: Normal pulses.     Heart sounds: Normal heart sounds.  Pulmonary:     Effort: Pulmonary effort is normal.     Breath sounds: Normal breath sounds.  Musculoskeletal:     Cervical back: Normal range of motion and neck supple.     Comments: Normal Muscle Bulk and Muscle Testing Reveals:  Upper Extremities: Full ROM and Muscle Strength on Right  5/5 and Left 4/5 Lower Extremities: Full ROM and Muscle Strength 5/5 Arises from Table with ease Narrow Based Gait   Skin:    General: Skin is warm and dry.  Neurological:     Mental Status: She is alert and oriented to person, place, and time.  Psychiatric:        Mood and Affect: Mood normal.        Behavior: Behavior normal.           Assessment & Plan:  1.Right Middle- Cerebral Artery Stroke: Left Hemiparesis: Continue current medication regimen. She has a scheduled appointment with Outpatient Therapy and  Neurology. Continue  to monitor. 2. Marijuana Abuse: Dana Henry denies Marijuana use: Reviewed Lab work with her. She will call hospital regarding her lab work. Continue to monitor.   F/U in 4- 6 weeks with Dr Posey Pronto

## 2020-12-01 ENCOUNTER — Encounter: Payer: Self-pay | Admitting: Registered Nurse

## 2020-12-02 ENCOUNTER — Ambulatory Visit: Payer: BC Managed Care – PPO

## 2020-12-02 NOTE — Patient Instructions (Incomplete)
SUBJECTIVE Chief complaint: Onset: Dana Henry is a 40 y.o. female who was referred to PT s/p right MCA CVA with left hemiparesis. She presented to [facility] on 11/13/2020, via EMS with complaints of left-sided weakness. She has a history of COVID infection on 09/19/2020. Neurology was consulted. Head CT showed acute infarction involving the right caudate, adjacent white matter, and superior lentiform nucleus. CT Angio Neck WO Contrast showed occlusion or high-grade stenosis right petrous ICA. MR Brain WO Contrast showed patchy multifocal acute ischemic right MCA territory infarcts involving the subcortical and deep white matter of the right frontal and parietal lobes, largely watershed in distribution. No associated hemorrhage or significant mass effect. Otherwise normal brain MRI for age. She underwent mechanical thrombectomy and rescue balloon angioplasty and stenting to right IC artery. She was admitted to inpatient rehabilitation on 11/18/2020, and discharged home on 11/22/2020. She was maintained on aspirin and Brillinta for CVA prophylaxis and had an outpatient follow-up with primary care on 11/29/20.  Directional pattern for falls: None Prior history of physical therapy for balance: None Follow-up appointment with MD: None scheduled Red flags (bowel/bladder changes, saddle paresthesia, personal history of cancer, chills/fever, night sweats, unrelenting pain) Negative   OBJECTIVE  MUSCULOSKELETAL: Tremor: Absent Bulk: Normal Tone: Normal, no clonus  Posture No gross abnormalities noted in standing or seated posture  Gait No gross abnormalities in gait noted  Strength R/L 5/5 Hip flexion 5/5 Hip external rotation 5/5 Hip internal rotation 5/5 Hip extension  5/5 Hip abduction 5/5 Hip adduction 5/5 Knee extension 5/5 Knee flexion 5/5 Ankle Plantarflexion 5/5 Ankle Dorsiflexion   NEUROLOGICAL:  Mental Status Patient is oriented to person, place and time.   Recent memory is intact.  Remote memory is intact.  Attention span and concentration are intact.  Expressive speech is intact.  Patient's fund of knowledge is within normal limits for educational level.  Cranial Nerves Visual acuity and visual fields are intact  Extraocular muscles are intact  Facial sensation is intact bilaterally  Facial strength is intact bilaterally  Hearing is normal as tested by gross conversation Palate elevates midline, normal phonation  Shoulder shrug strength is intact  Tongue protrudes midline   Sensation Grossly intact to light touch bilateral UEs/LEs as determined by testing dermatomes C2-T2/L2-S2 respectively Proprioception and hot/cold testing deferred on this date  Reflexes R/L 2+/2+ Knee Jerk (L3/4) 2+/2+ Ankle Jerk (S1/2)  Coordination/Cerebellar Finger to Nose: WNL Heel to Shin: WNL Rapid alternating movements: WNL Finger Opposition: WNL Pronator Drift: Negative   FUNCTIONAL OUTCOME MEASURES   Results Comments  BERG /56 Fall risk, in need of intervention  DGI /24   FGA /30   TUG seconds   5TSTS seconds   6 Minute Walk Test    10 Meter Gait Speed Self-selected: s = m/s; Fastest: s = m/s Below normative values for full community ambulation  ABC Scale %   DHI /100     POSTURAL CONTROL TESTS   Modified Clinical Test of Sensory Interaction for Balance    (CTSIB):  CONDITION TIME STRATEGY SWAY  Eyes open, firm surface 30 seconds ankle   Eyes closed, firm surface 30 seconds ankle   Eyes open, foam surface 30 seconds ankle   Eyes closed, foam surface 30 seconds ankle     ASSESSMENT Clinical Impression: Pt is a pleasant year-old female/female referred for difficulty with balance. PT examination reveals deficits . Pt presents with deficits in strength, gait and balance. Pt will benefit from skilled PT services to  address deficits in balance and decrease risk for future falls.    PLAN Next Visit: HEP:    Pt will be  independent with HEP in order to improve strength and balance in order to decrease fall risk and improve function at home and work.   Pt will improve BERG by at least 3 points in order to demonstrate clinically significant improvement in balance.    Pt will improve DGI by at least 3 points in order to demonstrate clinically significant improvement in balance and decreased risk for falls.  Pt will improve ABC by at least 13% in order to demonstrate clinically significant improvement in balance confidence.   Pt will decrease 5TSTS by at least 3 seconds in order to demonstrate clinically significant improvement in LE strength.  Pt will decrease TUG to below 14 seconds/decrease in order to demonstrate decreased fall risk.  Pt will decrease DHI score by at least 18 points in order to demonstrate clinically significant reduction in disability   Pt will increase 6MWT by at least 27m (135ft) in order to demonstrate clinically significant improvement in cardiopulmonary endurance and community ambulation

## 2020-12-04 ENCOUNTER — Ambulatory Visit: Payer: BC Managed Care – PPO | Attending: Physician Assistant

## 2020-12-04 ENCOUNTER — Other Ambulatory Visit: Payer: Self-pay

## 2020-12-04 ENCOUNTER — Encounter: Payer: BC Managed Care – PPO | Admitting: Registered Nurse

## 2020-12-04 DIAGNOSIS — I63511 Cerebral infarction due to unspecified occlusion or stenosis of right middle cerebral artery: Secondary | ICD-10-CM | POA: Diagnosis not present

## 2020-12-04 DIAGNOSIS — M6281 Muscle weakness (generalized): Secondary | ICD-10-CM | POA: Diagnosis not present

## 2020-12-04 DIAGNOSIS — R2681 Unsteadiness on feet: Secondary | ICD-10-CM | POA: Insufficient documentation

## 2020-12-04 NOTE — Therapy (Signed)
[facility] [address] [place], [place], [address] Phone: [phone]   Fax:  [phone]  Physical Therapy Evaluation  Patient Details  Name: Nadiyah Zeis MRN: [id number] Date of Birth: 1981-07-01 Referring Provider (PT): Lauraine Rinne PA-C   Encounter Date: 12/04/2020   PT End of Session - 12/05/20 0836    Visit Number 1    Number of Visits 17    Date for PT Re-Evaluation 01/29/21    Authorization Type eval: 12/04/20    PT Start Time 0944    PT Stop Time 1015    PT Time Calculation (min) 31 min    Equipment Utilized During Treatment Gait belt    Activity Tolerance Patient tolerated treatment well    Behavior During Therapy [facility] for tasks assessed/performed           Past Medical History:  Diagnosis Date  . Anemia   . History of cesarean delivery affecting pregnancy 08/10/2017  . Kidney stones    surgically removed at 40YO and 40YO  . Migraines    associated with menstrual cycle  . UTI (urinary tract infection)   . Vaginal Pap smear, abnormal     Past Surgical History:  Procedure Laterality Date  . BUBBLE STUDY  11/18/2020   Procedure: BUBBLE STUDY;  Surgeon: Elouise Munroe, MD;  Location: [place];  Service: Cardiology;;  . CESAREAN SECTION N/A 07/21/2014   Procedure: CESAREAN SECTION;  Surgeon: Elveria Royals, MD;  Location: [facility];  Service: Obstetrics;  Laterality: N/A;  . CESAREAN SECTION N/A 08/10/2017   Procedure: Repeat CESAREAN SECTION;  Surgeon: Azucena Fallen, MD;  Location: [place];  Service: Obstetrics;  Laterality: N/A;  EDD: 08/17/17 Allergy: Sulfa  . IR CT HEAD LTD  11/13/2020  . IR INTRA CRAN STENT  11/13/2020  . IR PERCUTANEOUS ART THROMBECTOMY/INFUSION INTRACRANIAL INC DIAG ANGIO  11/13/2020  . KIDNEY STONE SURGERY    . RADIOLOGY WITH ANESTHESIA N/A 11/13/2020   Procedure: IR WITH ANESTHESIA;  Surgeon: Radiologist, Medication, MD;  Location: [place];  Service: Radiology;   Laterality: N/A;  . TEE WITHOUT CARDIOVERSION N/A 11/18/2020   Procedure: TRANSESOPHAGEAL ECHOCARDIOGRAM (TEE);  Surgeon: Elouise Munroe, MD;  Location: [place];  Service: Cardiology;  Laterality: N/A;  . TONSILLECTOMY AND ADENOIDECTOMY  1991    There were no vitals filed for this visit.    Subjective Assessment - 12/05/20 0831    Subjective CVA    Pertinent History Ms. Taulbee presented to [facility] on 11/13/2020, via EMS with complaints of left-sided weakness. She has a history of COVID infection on 09/19/2020. Neurology Consulted. 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 Ms. Limbert was admitted to inpatient rehabilitation on 11/18/2020, and discharged home on 11/22/2020. She was maintained on aspirin and Brillinta for CVA prophylaxis. No therapy since discharge. No prior history of CVA. She had a follow-up with physiatry on 11/29/20 and has an OP follow-up with neurology in April 2022. She reports persistent intermittent mild headaches on her right side which are managed with Tylenol. PCP started her on Prozac a few weeks ago which pt reports has helped with her anxiety. She continues to complain about fatigue since her CVA. Pt reports that her primary issue at this point is  with her LUE. She initially had LLE weakness which she believes has mostly resolved although occasionally she feels her L foot drag. She denies any issues with her balance and no reported falls. She reports that her LLE occasionally feels like it is "going to sleep" but otherwise denies N/T. Pt works at [employer] and her job requires her to be on the computer. Currently out of work until cleared by MD.    Diagnostic tests See history    Patient Stated Goals Improve LUE  strength and fine motor coordination    Currently in Pain? No/denies              [facility] PT Assessment - 12/04/20 0954      Assessment   Medical Diagnosis CVA    Referring Provider (PT) Lauraine Rinne PA-C    Onset Date/Surgical Date 11/13/20    Hand Dominance Right    Next MD Visit April 2022 with neurology    Prior Therapy Yes during admission to [facility] and IP rehab      Precautions   Precautions None      Restrictions   Weight Bearing Restrictions No      Balance Screen   Has the patient fallen in the past 6 months No    Has the patient had a decrease in activity level because of a fear of falling?  Yes    Is the patient reluctant to leave their home because of a fear of falling?  Yes      [place] Private residence    Living Arrangements Spouse/significant other;Children   Husband and two daughters (3 and 6)   Available Help at Discharge Family    Type of [place] to enter    Entrance Stairs-Number of Steps 1    Entrance Stairs-Rails None   Post to hold   Home Layout Two level;Bed/bath upstairs    Alternate Level Stairs-Number of Steps 14    Alternate Level Stairs-Rails Left    Home Equipment Shower seat - built in      Prior Function   Level of Independence Independent    Vocation Other (comment)   Works at [employer] on [address], exercise, going to park with girls      Cognition   Overall Cognitive Status Within Functional Limits for tasks assessed            SUBJECTIVE Onset: Ms. Beirne presented to [facility] on 11/13/2020, via EMS with complaints of left-sided weakness. She has a history of COVID infection on 09/19/2020. Neurology Consulted. She was diagnosed with acute ischemic right MCA strokedue to embolism from right petrous ICA occlusion with underlying stenosiss/p IR withmechanical thrombectomy withTICI 3revascularization  She is s/prescueballoon angioplasty and stenting for Internal Carotidpetrous seg stenosis. Ms. Shannahan was admitted to inpatient rehabilitation on 11/18/2020, and discharged home on 11/22/2020. She was maintained on aspirin and Brillinta for CVA prophylaxis. No therapy since discharge. No prior history of CVA. She had a follow-up with physiatry on 11/29/20 and has an OP follow-up with neurology in April 2022. She reports persistent intermittent mild headaches on her right side which are managed with Tylenol. PCP started her on Prozac a few weeks ago which pt reports has helped with her anxiety. She continues to complain about fatigue since her CVA. Pt reports that her primary issue at this point is with her LUE.  She initially had LLE weakness which she believes has mostly resolved although occasionally she feels her L foot drag. She denies any issues with her balance and no reported falls. She reports that her LLE occasionally feels like it is "going to sleep" but otherwise denies N/T. Pt works at [employer] and her job requires her to be on the computer. Currently out of work until cleared by MD.     [place] flags (bowel/bladder changes, saddle paresthesia, personal history of cancer, chills/fever, night sweats, unrelenting pain) Negative   OBJECTIVE  MUSCULOSKELETAL: Tremor: Absent Bulk: Normal  Posture No gross abnormalities noted in standing or seated posture  Gait No gross abnormalities in gait noted other than some slight slowing of self-selected speed  Strength R/L 5/4+ Hip flexion 5/5 Hip external rotation 5/5 Hip internal rotation 4-/4- Hip extension  4-/4- Hip abduction 4/4 Hip adduction 5/5 Knee extension 4+/4+ Knee flexion >10 reps/>10 reps Ankle plantarflexion 5/4+ Ankle dorsiflexion 5/4+ Shoulder flexion 5/4 Shoulder abduction 5/4+ Elbow flexion 5/4+ Elbow extension 5/4 Wrist extension 5/4 Wrist flexion Strong and symmetrical finger abduction/adduction R: 47.9#, L:  31.8#   NEUROLOGICAL:  Mental Status Patient is oriented to person, place and time.  Recent memory is intact.  Remote memory is intact.  Attention span and concentration are intact.  Expressive speech is intact.  Patient's fund of knowledge is within normal limits for educational level.  Cranial Nerves Visual acuity and visual fields are intact  Extraocular muscles are intact  Facial sensation is intact bilaterally  Facial strength is intact bilaterally, possibly slight droop of L corner of mouth but faint and hard to tell;  Hearing is normal as tested by gross conversation Palate elevates midline, normal phonation  Shoulder shrug strength is intact  Tongue protrudes midline   Sensation Grossly intact to light touch bilateral UEs/LEs as determined by testing dermatomes C2-T2/L2-S2 respectively Proprioception and hot/cold testing deferred on this date  Reflexes Deferred  Coordination/Cerebellar Finger to Nose: Mild dysmetria on the LUE Heel to Shin: WNL Rapid alternating movements: Mildly abnormal on the left; Finger Opposition: WNL Pronator Drift: Mild positive on the L   FUNCTIONAL OUTCOME MEASURES   Results Comments  BERG NT   DGI NT   FGA NT   TUG NT   5TSTS NT   6 Minute Walk Test NT   10 Meter Gait Speed NT   FOTO 68 Predicted improvement to 79  Stroke impact Scale 1. Physical: 50%, 2. Memory: 92.9%, 3. Emotions:75%, 4. Communication: 100%, 5. ADL/IADL: 80%, 6. Mobility: 91.7%, 7. UE: 45%, Activity/purpose: 53.1% 9. Recovery: 70%     POSTURAL CONTROL TESTS   Modified Clinical Test of Sensory Interaction for Balance    (CTSIB): Deferred                    Objective measurements completed on examination: See above findings.               PT Education - 12/05/20 0836    Education Details Plan of care    Person(s) Educated Patient    Methods Explanation    Comprehension Verbalized understanding            PT Short Term  Goals - 12/05/20 1337      PT SHORT TERM GOAL #1   Title Pt will be independent with HEP in order to improve strength and balance in order to decrease fall risk and improve function at home and work.    Time 4  Period Weeks    Status New    Target Date 01/01/21             PT Long Term Goals - 12/05/20 1337      PT LONG TERM GOAL #1   Title Pt will improve FOTO to at least 79 in order to demonstrate clinically significant improvement in function    Baseline 12/04/20: 68    Time 8    Period Weeks    Status New    Target Date 01/29/21      PT LONG TERM GOAL #2   Title Patient will improve left hip flexion and left ankle dorsiflexion to 5/5 in order to decrease her risk of tripping over left foot and falling.    Baseline 12/04/20: both 4+/5    Time 8    Period Weeks    Status New    Target Date 01/29/21      PT LONG TERM GOAL #3   Title Pt will report at least 90% recovery from her stroke in order to resume normal function at home and work    Baseline 12/04/20: 70%    Time 8    Status New    Target Date 01/29/21                  Plan - 12/05/20 0838    Clinical Impression Statement Pt is a 51 pleasant year-old female referred s/p CVA 11/13/20.  Patient reports minimal difficulty related to left lower extremity weakness.  She denies any issues with her gait or balance.  Her primary complaint upon arrival today is her left upper extremity function.  She complains of difficulty with LUE coordination and fine motor control.  She arrived late for appointment so session was somewhat limited and unable to perform balance assessment today.  No gross deficits noted during gait walking into clinic with the exception of some slightly decreased self-selected gait speed.  She demonstrates mild deficits in bilateral lower extremity strength with the only focal weakness noted with slight decrease in left hip flexion and left ankle dorsiflexion strength.  More significant weakness noted  from side to side and left upper extremity.  She demonstrates deficits in left shoulder flexion and abduction strength as well as left elbow flexion extension strength.  She also presents with deficits in left wrist flexion and extension strength as well as decreased left grip strength compared to the right side.  She has a mild dysmetria in the left upper extremity with a positive pronator drift and abnormal rapid alternating movements in the left upper extremity.  Her most significant self-reported deficits on the stroke impact scale were related to her left upper extremity function.  Overall she reports about 70% recovery from her stroke at this time.  We will perform additional outcome measures that first follow-up to fully assess her balance.  It is possible that she would be referred onward to the main therapy clinic so that she can also receive occupational therapy services. Pt will benefit from skilled PT services to address deficits in balance and strength.    Personal Factors and Comorbidities Comorbidity 1    Comorbidities Migraines,    Examination-Activity Limitations Dressing;Lift    Examination-Participation Restrictions Occupation    Stability/Clinical Decision Making Evolving/Moderate complexity    Clinical Decision Making Low    Rehab Potential Good    PT Frequency 2x / week    PT Duration 8 weeks    PT Treatment/Interventions ADLs/Self Care Home Management;Aquatic  Therapy;Canalith Repostioning;Cryotherapy;Electrical Stimulation;Iontophoresis 4mg /ml Dexamethasone;Biofeedback;Moist Heat;Traction;Ultrasound;Contrast Bath;DME Instruction;Gait training;Stair training;Functional mobility training;Therapeutic activities;Therapeutic exercise;Balance training;Neuromuscular re-education;Patient/family education;Manual techniques;Passive range of motion;Dry needling;Vestibular;Spinal Manipulations;Joint Manipulations    PT Next Visit Plan Outcome measures (BERG, FGA, miniBEST), 9 hole peg test,  determine need for additional PT or refer on to OT    PT Home Exercise Plan None currently    Consulted and Agree with Plan of Care Patient           Patient will benefit from skilled therapeutic intervention in order to improve the following deficits and impairments:  Decreased strength,Impaired UE functional use,Decreased balance  Visit Diagnosis: Muscle weakness (generalized)  Unsteadiness on feet     Problem List Patient Active Problem List   Diagnosis Date Noted  . Class 1 obesity due to excess calories with serious comorbidity and body mass index (BMI) of 30.0 to 30.9 in adult   . Slow transit constipation   . Hypoalbuminemia due to protein-calorie malnutrition ([place])   . Marijuana abuse   . Left hemiparesis ([place])   . Right middle cerebral artery stroke ([place]) 11/18/2020  . Dysphagia, post-stroke   . Acute blood loss anemia   . Stroke (cerebrum) ([place]) 11/13/2020  . Internal carotid artery stenosis, right 11/13/2020  . Migraines 10/06/2012   Phillips Grout PT, DPT, GCS  Arnie Maiolo 12/05/2020, 1:40 PM   [place] [facility] [address]. [place], [place], [address] Phone: [phone]   Fax:  [phone]  Name: Saara Kijowski MRN: [id number] Date of Birth: 12-19-1980

## 2020-12-09 ENCOUNTER — Ambulatory Visit: Payer: BC Managed Care – PPO

## 2020-12-09 NOTE — Patient Instructions (Incomplete)
Outcome measures (BERG, FGA, miniBEST, 15m gait speed, 6MWT, 5TSTS, TUG), 9 hole peg test, determine need for additional PT or refer on to OT   Initiate HEP

## 2020-12-11 ENCOUNTER — Other Ambulatory Visit: Payer: Self-pay

## 2020-12-11 ENCOUNTER — Ambulatory Visit: Payer: BC Managed Care – PPO

## 2020-12-11 DIAGNOSIS — R2681 Unsteadiness on feet: Secondary | ICD-10-CM

## 2020-12-11 DIAGNOSIS — I63511 Cerebral infarction due to unspecified occlusion or stenosis of right middle cerebral artery: Secondary | ICD-10-CM

## 2020-12-11 DIAGNOSIS — M6281 Muscle weakness (generalized): Secondary | ICD-10-CM | POA: Diagnosis not present

## 2020-12-12 NOTE — Therapy (Signed)
[facility] [address] [place], [place], [address] Phone: [phone]   Fax:  [phone]  Physical Therapy Treatment  Patient Details  Name: Dana Henry MRN: [id number] Date of Birth: [birth year] Referring Provider (PT): Lauraine Rinne PA-C   Encounter Date: 12/11/2020   PT End of Session - 12/11/20 0948    Visit Number 2    Number of Visits 17    Date for PT Re-Evaluation 01/29/21    Authorization Type eval: 12/04/20    PT Start Time 0935    PT Stop Time 1015    PT Time Calculation (min) 40 min    Equipment Utilized During Treatment Gait belt    Activity Tolerance Patient tolerated treatment well    Behavior During Therapy [facility] for tasks assessed/performed           Past Medical History:  Diagnosis Date  . Anemia   . History of cesarean delivery affecting pregnancy 08/10/2017  . Kidney stones    surgically removed at 40YO and 40YO  . Migraines    associated with menstrual cycle  . UTI (urinary tract infection)   . Vaginal Pap smear, abnormal     Past Surgical History:  Procedure Laterality Date  . BUBBLE STUDY  11/18/2020   Procedure: BUBBLE STUDY;  Surgeon: Elouise Munroe, MD;  Location: [place];  Service: Cardiology;;  . CESAREAN SECTION N/A 07/21/2014   Procedure: CESAREAN SECTION;  Surgeon: Elveria Royals, MD;  Location: [facility];  Service: Obstetrics;  Laterality: N/A;  . CESAREAN SECTION N/A 08/10/2017   Procedure: Repeat CESAREAN SECTION;  Surgeon: Azucena Fallen, MD;  Location: [place];  Service: Obstetrics;  Laterality: N/A;  EDD: 08/17/17 Allergy: Sulfa  . IR CT HEAD LTD  11/13/2020  . IR INTRA CRAN STENT  11/13/2020  . IR PERCUTANEOUS ART THROMBECTOMY/INFUSION INTRACRANIAL INC DIAG ANGIO  11/13/2020  . KIDNEY STONE SURGERY    . RADIOLOGY WITH ANESTHESIA N/A 11/13/2020   Procedure: IR WITH ANESTHESIA;  Surgeon: Radiologist, Medication, MD;  Location: [facility];  Service: Radiology;   Laterality: N/A;  . TEE WITHOUT CARDIOVERSION N/A 11/18/2020   Procedure: TRANSESOPHAGEAL ECHOCARDIOGRAM (TEE);  Surgeon: Elouise Munroe, MD;  Location: [place];  Service: Cardiology;  Laterality: N/A;  . TONSILLECTOMY AND ADENOIDECTOMY  1991    There were no vitals filed for this visit.   Subjective Assessment - 12/11/20 0936    Subjective Pt complains of a couple mild headaches since last session.  No pain upon arrival. No specific questions or concerns    Pertinent History Ms. Reach presented to [facility] on 11/13/2020, via EMS with complaints of left-sided weakness. She has a history of COVID infection on 09/19/2020. Neurology Consulted. 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 [name] was admitted to inpatient rehabilitation on 11/18/2020, and discharged home on 11/22/2020. She was maintained on aspirin and Brillinta for CVA prophylaxis. No therapy since discharge. No prior history of CVA. She had a follow-up with physiatry on 11/29/20 and has an OP follow-up with neurology in April 2022. She reports persistent intermittent mild headaches on her right side which are managed with Tylenol. PCP started her on Prozac a few weeks ago which pt reports has helped with her anxiety. She  continues to complain about fatigue since her CVA. Pt reports that her primary issue at this point is with her LUE. She initially had LLE weakness which she believes has mostly resolved although occasionally she feels her L foot drag. She denies any issues with her balance and no reported falls. She reports that her LLE occasionally feels like it is "going to sleep" but otherwise denies N/T. Pt works at [employer] and her job requires her to be on the computer.  Currently out of work until cleared by MD.    Diagnostic tests See history    Patient Stated Goals Improve LUE strength and fine motor coordination    Currently in Pain? No/denies              [facility] PT Assessment - 12/11/20 0946      Standardized Balance Assessment   Standardized Balance Assessment Berg Balance Test;Mini-BESTest      Berg Balance Test   Sit to Stand Able to stand without using hands and stabilize independently    Standing Unsupported Able to stand safely 2 minutes    Sitting with Back Unsupported but Feet Supported on Floor or Stool Able to sit safely and securely 2 minutes    Stand to Sit Sits safely with minimal use of hands    Transfers Able to transfer safely, minor use of hands    Standing Unsupported with Eyes Closed Able to stand 10 seconds safely    Standing Unsupported with Feet Together Able to place feet together independently and stand 1 minute safely    From Standing, Reach Forward with Outstretched Arm Can reach confidently >25 cm (10")    From Standing Position, Pick up Object from Floor Able to pick up shoe safely and easily    From Standing Position, Turn to Look Behind Over each Shoulder Looks behind from both sides and weight shifts well    Turn 360 Degrees Able to turn 360 degrees safely in 4 seconds or less    Standing Unsupported, Alternately Place Feet on Step/Stool Able to stand independently and safely and complete 8 steps in 20 seconds    Standing Unsupported, One Foot in Front Able to place foot tandem independently and hold 30 seconds    Standing on One Leg Able to lift leg independently and hold > 10 seconds    Total Score 56      Mini-BESTest   Sit To Stand Normal: Comes to stand without use of hands and stabilizes independently.    Rise to Toes Normal: Stable for 3 s with maximum height.    Stand on one leg (left) Normal: 20 s.    Stand on one leg (right) Normal: 20 s.    Stand on one leg - lowest score 2    Compensatory Stepping  Correction - Forward Normal: Recovers independently with a single, large step (second realignement is allowed).    Compensatory Stepping Correction - Backward Normal: Recovers independently with a single, large step    Compensatory Stepping Correction - Left Lateral Normal: Recovers independently with 1 step (crossover or lateral OK)    Compensatory Stepping Correction - Right Lateral Normal: Recovers independently with 1 step (crossover or lateral OK)    Stepping Corredtion Lateral - lowest score 2    Stance - Feet together, eyes open, firm surface  Normal: 30s    Stance - Feet together, eyes closed, foam surface  Normal: 30s    Incline - Eyes Closed Normal: Stands independently 30s and aligns  with gravity    Change in Gait Speed Normal: Significantly changes walkling speed without imbalance    Walk with head turns - Horizontal Normal: performs head turns with no change in gait speed and good balance    Walk with pivot turns Normal: Turns with feet close FAST (< 3 steps) with good balance.    Step over obstacles Normal: Able to step over box with minimal change of gait speed and with good balance.    Timed UP & GO with Dual Task Normal: No noticeable change in sitting, standing or walking while backward counting when compared to TUG without   TUG: 8.2s, 9.6s   Mini-BEST total score 28      Functional Gait  Assessment   Gait assessed  Yes    Gait Level Surface Walks 20 ft in less than 5.5 sec, no assistive devices, good speed, no evidence for imbalance, normal gait pattern, deviates no more than 6 in outside of the 12 in walkway width.    Change in Gait Speed Able to smoothly change walking speed without loss of balance or gait deviation. Deviate no more than 6 in outside of the 12 in walkway width.    Gait with Horizontal Head Turns Performs head turns smoothly with no change in gait. Deviates no more than 6 in outside 12 in walkway width    Gait with Vertical Head Turns Performs head turns with  no change in gait. Deviates no more than 6 in outside 12 in walkway width.    Gait and Pivot Turn Pivot turns safely within 3 sec and stops quickly with no loss of balance.    Step Over Obstacle Is able to step over one shoe box (4.5 in total height) without changing gait speed. No evidence of imbalance.    Gait with Narrow Base of Support Is able to ambulate for 10 steps heel to toe with no staggering.    Gait with Eyes Closed Walks 20 ft, no assistive devices, good speed, no evidence of imbalance, normal gait pattern, deviates no more than 6 in outside 12 in walkway width. Ambulates 20 ft in less than 7 sec.    Ambulating Backwards Walks 20 ft, uses assistive device, slower speed, mild gait deviations, deviates 6-10 in outside 12 in walkway width.    Steps Alternating feet, no rail.    Total Score 28             TREATMENT   Neuromuscular Re-education  Performed additional outcome measures with patient: BERG: 56/56; MiniBEST: 28/28 FGA: 28/30;   Ther-ex  Resisted L ankle DF, green TB, 2x12, added to HEP Resisted seated L hip flexion, green TB 2x12, added to HEP  Initiated and review HEP with pt    Pt educated throughout session about proper posture and technique with exercises. Improved exercise technique, movement at target joints, use of target muscles after min to mod verbal, visual, tactile cues.    Pt demonstrated great motivation to participate in PT today.  Began session by completing some outcome measures, which she did very well with.  She scored 56/56 on the BERG balance, 28/28 on the miniBest, and 28/30 on the FGA representing very good balance with minimal to no balance deficits.  Pt also exhibits minimal strength deficits only seen in L DF and L hip flexion.  Discussed starting OT and continuing with a few sessions of PT.  Pt will continue PT here and schedule an OT evaluation.  Pt will benefit from  skilled PT to progress pt to better function in walking and ADLs.                        PT Short Term Goals - 12/05/20 1337      PT SHORT TERM GOAL #1   Title Pt will be independent with HEP in order to improve strength and balance in order to decrease fall risk and improve function at home and work.    Time 4    Period Weeks    Status New    Target Date 01/01/21             PT Long Term Goals - 12/05/20 1337      PT LONG TERM GOAL #1   Title Pt will improve FOTO to at least 79 in order to demonstrate clinically significant improvement in function    Baseline 12/04/20: 68    Time 8    Period Weeks    Status New    Target Date 01/29/21      PT LONG TERM GOAL #2   Title Patient will improve left hip flexion and left ankle dorsiflexion to 5/5 in order to decrease her risk of tripping over left foot and falling.    Baseline 12/04/20: both 4+/5    Time 8    Period Weeks    Status New    Target Date 01/29/21      PT LONG TERM GOAL #3   Title Pt will report at least 90% recovery from her stroke in order to resume normal function at home and work    Baseline 12/04/20: 70%    Time 8    Status New    Target Date 01/29/21                 Plan - 12/12/20 0826    Personal Factors and Comorbidities Comorbidity 1    Comorbidities Migraines,    Examination-Activity Limitations Dressing;Lift    Examination-Participation Restrictions Occupation    Stability/Clinical Decision Making Evolving/Moderate complexity    Rehab Potential Good    PT Frequency 2x / week    PT Duration 8 weeks    PT Treatment/Interventions ADLs/Self Care Home Management;Aquatic Therapy;Canalith Repostioning;Cryotherapy;Electrical Stimulation;Iontophoresis 4mg /ml Dexamethasone;Biofeedback;Moist Heat;Traction;Ultrasound;Contrast Bath;DME Instruction;Gait training;Stair training;Functional mobility training;Therapeutic activities;Therapeutic exercise;Balance training;Neuromuscular re-education;Patient/family education;Manual techniques;Passive range of  motion;Dry needling;Vestibular;Spinal Manipulations;Joint Manipulations    PT Next Visit Plan Continue with LE strengthening, ensure OT referral has be received and eval scheduled    PT Home Exercise Plan None currently    Consulted and Agree with Plan of Care Patient           Patient will benefit from skilled therapeutic intervention in order to improve the following deficits and impairments:  Decreased strength,Impaired UE functional use,Decreased balance  Visit Diagnosis: Muscle weakness (generalized)  Unsteadiness on feet  Right middle cerebral artery stroke [facility])     Problem List Patient Active Problem List   Diagnosis Date Noted  . Class 1 obesity due to excess calories with serious comorbidity and body mass index (BMI) of 30.0 to 30.9 in adult   . Slow transit constipation   . Hypoalbuminemia due to protein-calorie malnutrition ([place])   . Marijuana abuse   . Left hemiparesis ([place])   . 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 Dysphagia, post-stroke   . Acute blood loss anemia   . Stroke (cerebrum) ([place]) 11/13/2020  . Internal carotid artery stenosis, right 11/13/2020  .  Migraines 10/06/2012   Phillips Grout PT, DPT, GCS  Huprich,Jason 12/12/2020, 11:39 AM  [facility] [address] [place], [place], [address] Phone: [phone]   Fax:  [phone]  Name: Akshita Italiano MRN: [id number] Date of Birth: 12-26-1980

## 2020-12-16 ENCOUNTER — Ambulatory Visit: Payer: BC Managed Care – PPO

## 2020-12-16 ENCOUNTER — Other Ambulatory Visit: Payer: Self-pay

## 2020-12-16 DIAGNOSIS — R2681 Unsteadiness on feet: Secondary | ICD-10-CM | POA: Diagnosis not present

## 2020-12-16 DIAGNOSIS — I63511 Cerebral infarction due to unspecified occlusion or stenosis of right middle cerebral artery: Secondary | ICD-10-CM | POA: Diagnosis not present

## 2020-12-16 DIAGNOSIS — M6281 Muscle weakness (generalized): Secondary | ICD-10-CM

## 2020-12-16 NOTE — Patient Instructions (Addendum)
Access Code: [email or web address] URL: [email or web address] Date: 12/16/2020 Prepared by: Roxana Hires  Exercises Seated Ankle Eversion with Resistance - 2 x daily - 7 x weekly - 2 sets - 10 reps - 3s hold Ankle Dorsiflexion with Resistance - 2 x daily - 7 x weekly - 2 sets - 10 reps - 3s hold Seated March with Resistance - 2 x daily - 7 x weekly - 2 sets - 10 reps - 3s hold Seated Shoulder Row with Anchored Resistance - 2 x daily - 7 x weekly - 2 sets - 10 reps - 3s hold Seated Shoulder Extension and Scapular Retraction with Resistance - 2 x daily - 7 x weekly - 2 sets - 10 reps - 3s hold Standing Shoulder Flexion with Resistance - 2 x daily - 7 x weekly - 2 sets - 10 reps - 3s hold

## 2020-12-16 NOTE — Therapy (Addendum)
[facility] [address]. [place], [place], [address] Phone: [phone]   Fax:  [phone]  Physical Therapy Treatment  Patient Details  Name: Dana Henry MRN: [id number] Date of Birth: 1981-04-10 Referring Provider (PT): Lauraine Rinne PA-C   Encounter Date: 12/16/2020   PT End of Session - 12/16/20 0935    Visit Number 3    Number of Visits 17    Date for PT Re-Evaluation 01/29/21    Authorization Type eval: 12/04/20    PT Start Time 0932    PT Stop Time 1025    PT Time Calculation (min) 53 min    Equipment Utilized During Treatment Gait belt    Activity Tolerance Patient tolerated treatment well    Behavior During Therapy [facility] for tasks assessed/performed           Past Medical History:  Diagnosis Date  . Anemia   . History of cesarean delivery affecting pregnancy 08/10/2017  . Kidney stones    surgically removed at 40YO and 40YO  . Migraines    associated with menstrual cycle  . UTI (urinary tract infection)   . Vaginal Pap smear, abnormal     Past Surgical History:  Procedure Laterality Date  . BUBBLE STUDY  11/18/2020   Procedure: BUBBLE STUDY;  Surgeon: Elouise Munroe, MD;  Location: [place];  Service: Cardiology;;  . CESAREAN SECTION N/A 07/21/2014   Procedure: CESAREAN SECTION;  Surgeon: Elveria Royals, MD;  Location: [facility];  Service: Obstetrics;  Laterality: N/A;  . CESAREAN SECTION N/A 08/10/2017   Procedure: Repeat CESAREAN SECTION;  Surgeon: Azucena Fallen, MD;  Location: [place];  Service: Obstetrics;  Laterality: N/A;  EDD: 08/17/17 Allergy: Sulfa  . IR CT HEAD LTD  11/13/2020  . IR INTRA CRAN STENT  11/13/2020  . IR PERCUTANEOUS ART THROMBECTOMY/INFUSION INTRACRANIAL INC DIAG ANGIO  11/13/2020  . KIDNEY STONE SURGERY    . RADIOLOGY WITH ANESTHESIA N/A 11/13/2020   Procedure: IR WITH ANESTHESIA;  Surgeon: Radiologist, Medication, MD;  Location: [place];  Service: Radiology;   Laterality: N/A;  . TEE WITHOUT CARDIOVERSION N/A 11/18/2020   Procedure: TRANSESOPHAGEAL ECHOCARDIOGRAM (TEE);  Surgeon: Elouise Munroe, MD;  Location: [place];  Service: Cardiology;  Laterality: N/A;  . TONSILLECTOMY AND ADENOIDECTOMY  1991    There were no vitals filed for this visit.   Subjective Assessment - 12/16/20 0935    Subjective Pt reports that she is doing well today. No changes since last therapy session. No pain upon arrival. No specific questions or concerns    Pertinent History Ms. Sheaffer presented to [facility] on 11/13/2020, via EMS with complaints of left-sided weakness. She has a history of COVID infection on 09/19/2020. Neurology Consulted. 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 Ms. Ullery was admitted to inpatient rehabilitation on 11/18/2020, and discharged home on 11/22/2020. She was maintained on aspirin and Brillinta for CVA prophylaxis. No therapy since discharge. No prior history of CVA. She had a follow-up with physiatry on 11/29/20 and has an OP follow-up with neurology in April 2022. She reports persistent intermittent mild headaches on her right side which are managed with Tylenol. PCP started her on Prozac a few weeks ago which pt reports has helped with  her anxiety. She continues to complain about fatigue since her CVA. Pt reports that her primary issue at this point is with her LUE. She initially had LLE weakness which she believes has mostly resolved although occasionally she feels her L foot drag. She denies any issues with her balance and no reported falls. She reports that her LLE occasionally feels like it is "going to sleep" but otherwise denies N/T. Pt works at [employer] and her job requires her to be on  the computer. Currently out of work until cleared by MD.    Diagnostic tests See history    Patient Stated Goals Improve LUE strength and fine motor coordination            TREATMENT  Ther-ex  NuStep L2 x 5 minutes for warm-up during history (4 minutes unbilled); Total Gym L22 double leg squats x 20; Total Gym L22 L single leg squats 2 x 20; Total Gym L22 L single leg heel raises 2 x 20; TRX reverse lunges with LLE forward 2 x 10; TRXlateral lunges with LLE forward 2 x 10; Standing green tband rows with scap retraction 3s hold 2 x 10; Standing green tband shoulder extension 3s hold 2 x 10; Green tband resisted side stepping 4 lengths x 2; Resisted L ankle DF with green tband 3s hold 2 x 10; Resisted L ankle eversion with green tband 3s hold 2 x 10;  Key pinch: R: 11# L: 8# Two-point pinch with thumb:: 2nd: R: 7# L: 6.5#    3rd: R: 6# L: 6# 4th: R: 4# L: 4# 5th: R: 2.5# L: 2.5# Grip strength: R: 48.8# L: 37.2#    UE MMT: mild deficit in L shoulder flexion and abduction and bicep extension compared to R side;   Pt educated throughout session about proper posture and technique with exercises. Improved exercise technique, movement at target joints, use of target muscles after min to mod verbal, visual, tactile cues.    Pt demonstrated great motivation to participate in PT today. Performed new LE strengthening exercises today with focus on isolating LLE as much as possible. Performed UE strength testing and pt shows very mild deficits in L shoulder flexion and abduction as well as L elbow extension compared to R side. She reports that she continues to have the most difficulty with overhead strength with LUE as well as L hand fine motor coordination. She has continued to work on L hand exercises that were provided while she was at inpatient rehab. Her L key pinch strength is slightly diminished on the L side compared to the right side but two point pinch with each digit of L hand  is roughly symmetrical to R side. Pt issued additional exercises to add to her HEP and encouraged to follow-up as scheduled. Therapist continues to recommend an OT evaluation.  Pt will benefit from skilled PT to progress pt to better function in walking and ADLs.                            PT Education - 12/16/20 1034    Education Details HEP update    Person(s) Educated Patient    Methods Explanation    Comprehension Verbalized understanding            PT Short Term Goals - 12/05/20 1337      PT SHORT TERM GOAL #1   Title Pt will be independent with HEP in order to improve strength and  balance in order to decrease fall risk and improve function at home and work.    Time 4    Period Weeks    Status New    Target Date 01/01/21             PT Long Term Goals - 12/05/20 1337      PT LONG TERM GOAL #1   Title Pt will improve FOTO to at least 79 in order to demonstrate clinically significant improvement in function    Baseline 12/04/20: 68    Time 8    Period Weeks    Status New    Target Date 01/29/21      PT LONG TERM GOAL #2   Title Patient will improve left hip flexion and left ankle dorsiflexion to 5/5 in order to decrease her risk of tripping over left foot and falling.    Baseline 12/04/20: both 4+/5    Time 8    Period Weeks    Status New    Target Date 01/29/21      PT LONG TERM GOAL #3   Title Pt will report at least 90% recovery from her stroke in order to resume normal function at home and work    Baseline 12/04/20: 70%    Time 8    Status New    Target Date 01/29/21                 Plan - 12/16/20 0936    Clinical Impression Statement Pt demonstrated great motivation to participate in PT today. Performed new LE strengthening exercises today with focus on isolating LLE as much as possible. Performed UE strength testing and pt shows very mild deficits in L shoulder flexion and abduction as well as L elbow extension compared to R  side. She reports that she continues to have the most difficulty with overhead strength with LUE as well as L hand fine motor coordination. She has continued to work on L hand exercises that were provided while she was at inpatient rehab. Her L key pinch strength is slightly diminished on the L side compared to the right side but two point pinch with each digit of L hand is roughly symmetrical to R side. Pt issued additional exercises to add to her HEP and encouraged to follow-up as scheduled. Therapist continues to recommend an OT evaluation.  Pt will benefit from skilled PT to progress pt to better function in walking and ADLs.    Personal Factors and Comorbidities Comorbidity 1    Comorbidities Migraines,    Examination-Activity Limitations Dressing;Lift    Examination-Participation Restrictions Occupation    Stability/Clinical Decision Making Evolving/Moderate complexity    Rehab Potential Good    PT Frequency 2x / week    PT Duration 8 weeks    PT Treatment/Interventions ADLs/Self Care Home Management;Aquatic Therapy;Canalith Repostioning;Cryotherapy;Electrical Stimulation;Iontophoresis 4mg /ml Dexamethasone;Biofeedback;Moist Heat;Traction;Ultrasound;Contrast Bath;DME Instruction;Gait training;Stair training;Functional mobility training;Therapeutic activities;Therapeutic exercise;Balance training;Neuromuscular re-education;Patient/family education;Manual techniques;Passive range of motion;Dry needling;Vestibular;Spinal Manipulations;Joint Manipulations    PT Next Visit Plan Continue with LE strengthening, ensure OT referral has be received and eval scheduled    PT Home Exercise Plan Access Code: [email or web address]    Consulted and Agree with Plan of Care Patient           Patient will benefit from skilled therapeutic intervention in order to improve the following deficits and impairments:  Decreased strength,Impaired UE functional use,Decreased balance  Visit Diagnosis: Muscle weakness  (generalized)     Problem List Patient Active Problem  List   Diagnosis Date Noted  . Class 1 obesity due to excess calories with serious comorbidity and body mass index (BMI) of 30.0 to 30.9 in adult   . Slow transit constipation   . Hypoalbuminemia due to protein-calorie malnutrition ([place])   . Marijuana abuse   . Left hemiparesis ([place])   . 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 Dysphagia, post-stroke   . Acute blood loss anemia   . Stroke (cerebrum) ([place]) 11/13/2020  . Internal carotid artery stenosis, right 11/13/2020  . Migraines 10/06/2012   Phillips Grout PT, DPT, GCS  Maisyn Nouri 12/16/2020, 10:48 AM  [facility] [address]. [place], [place], [address] Phone: [phone]   Fax:  [phone]  Name: Stephaniemarie Stoffel MRN: [id number] Date of Birth: 07-Jun-1981

## 2020-12-18 ENCOUNTER — Other Ambulatory Visit: Payer: Self-pay

## 2020-12-18 ENCOUNTER — Ambulatory Visit: Payer: BC Managed Care – PPO | Attending: Physician Assistant

## 2020-12-18 DIAGNOSIS — M6281 Muscle weakness (generalized): Secondary | ICD-10-CM | POA: Insufficient documentation

## 2020-12-18 DIAGNOSIS — R2681 Unsteadiness on feet: Secondary | ICD-10-CM | POA: Diagnosis not present

## 2020-12-18 NOTE — Therapy (Signed)
[facility] [address]. [place], [place], [address] Phone: [phone]   Fax:  [phone]  Physical Therapy Treatment  Patient Details  Name: Dana Henry MRN: [id number] Date of Birth: 1981-02-01 Referring Provider (PT): Dana Rinne PA-C   Encounter Date: 12/18/2020   PT End of Session - 12/18/20 0934    Visit Number 4    Number of Visits 17    Date for PT Re-Evaluation 01/29/21    Authorization Type eval: 12/04/20    PT Start Time 0930    PT Stop Time 1015    PT Time Calculation (min) 45 min    Equipment Utilized During Treatment --    Activity Tolerance Patient tolerated treatment well    Behavior During Therapy [facility] for tasks assessed/performed           Past Medical History:  Diagnosis Date  . Anemia   . History of cesarean delivery affecting pregnancy 08/10/2017  . Kidney stones    surgically removed at 40YO and 40YO  . Migraines    associated with menstrual cycle  . UTI (urinary tract infection)   . Vaginal Pap smear, abnormal     Past Surgical History:  Procedure Laterality Date  . BUBBLE STUDY  11/18/2020   Procedure: BUBBLE STUDY;  Surgeon: Elouise Munroe, MD;  Location: [place];  Service: Cardiology;;  . CESAREAN SECTION N/A 07/21/2014   Procedure: CESAREAN SECTION;  Surgeon: Elveria Royals, MD;  Location: [facility];  Service: Obstetrics;  Laterality: N/A;  . CESAREAN SECTION N/A 08/10/2017   Procedure: Repeat CESAREAN SECTION;  Surgeon: Azucena Fallen, MD;  Location: [place];  Service: Obstetrics;  Laterality: N/A;  EDD: 08/17/17 Allergy: Sulfa  . IR CT HEAD LTD  11/13/2020  . IR INTRA CRAN STENT  11/13/2020  . IR PERCUTANEOUS ART THROMBECTOMY/INFUSION INTRACRANIAL INC DIAG ANGIO  11/13/2020  . KIDNEY STONE SURGERY    . RADIOLOGY WITH ANESTHESIA N/A 11/13/2020   Procedure: IR WITH ANESTHESIA;  Surgeon: Radiologist, Medication, MD;  Location: [place];  Service: Radiology;  Laterality:  N/A;  . TEE WITHOUT CARDIOVERSION N/A 11/18/2020   Procedure: TRANSESOPHAGEAL ECHOCARDIOGRAM (TEE);  Surgeon: Elouise Munroe, MD;  Location: [place];  Service: Cardiology;  Laterality: N/A;  . TONSILLECTOMY AND ADENOIDECTOMY  1991    There were no vitals filed for this visit.   Subjective Assessment - 12/18/20 0933    Subjective Pt reports that she is doing well today. She reports being a little sore after last session but it didn't last long.  No changes since last therapy session. No pain upon arrival. No specific questions or concerns.    Pertinent History [name] presented to [facility] on 11/13/2020, via EMS with complaints of left-sided weakness. She has a history of COVID infection on 09/19/2020. Neurology Consulted. 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 [name] was admitted to inpatient rehabilitation on 11/18/2020, and discharged home on 11/22/2020. She was maintained on aspirin and Brillinta for CVA prophylaxis. No therapy since discharge. No prior history of CVA. She had a follow-up with physiatry on 11/29/20 and has an OP follow-up with neurology in April 2022. She reports persistent intermittent mild headaches on her right side which are managed with Tylenol. PCP  started her on Prozac a few weeks ago which pt reports has helped with her anxiety. She continues to complain about fatigue since her CVA. Pt reports that her primary issue at this point is with her LUE. She initially had LLE weakness which she believes has mostly resolved although occasionally she feels her L foot drag. She denies any issues with her balance and no reported falls. She reports that her LLE occasionally feels like it is "going to sleep" but otherwise  denies N/T. Pt works at [employer] and her job requires her to be on the computer. Currently out of work until cleared by MD.    Diagnostic tests See history    Patient Stated Goals Improve LUE strength and fine motor coordination    Currently in Pain? No/denies           TREATMENT  Ther-ex  UBE x 8 minutes for warm-up during history (5 minutes unbilled); Total Gym L22 L single leg squats x 20, x10; Total Gym L22 L single leg heel raises 2 x 20; TRX reverse lunges with LLE forward 2 x 10; TRXlateral lunges with LLE forward 2 x 10; Standing green tband rows with scap retraction 3s hold 2 x 10; Standing green tband shoulder extension 3s hold 2 x 10; Resisted 4-way walk outs, 40 lbs, 3x down and back ladder each way  Key pinch along shelf x5 Shelf reaching from waist level to over head, x10 with 3lb, progressed to 4lb x10  Ground to waist level, weighted lift, 5lb, 2x10 to work on Midwife     Pt educated throughout session about proper posture and technique with exercises. Improved exercise technique, movement at target joints, use of target muscles after min to mod verbal, visual, tactile cues.    Pt demonstrated great motivation to participate in PT today. Continued to perform LE strengthening exercises today with focus on isolating LLE as much as possible.She reports that she continues to have the most difficulty with overhead strength with LUE as well as L hand fine motor coordination so she was introduced to some lifting and reaching exercises to work on overhead strength.  Pt was also introduced to key pinch exercises, which she tolerated well.. She has continued to work on L hand exercises that were provided while she was at Inpatient rehab.  Pt reports that she is doing well with her HEP and encouraged to follow-up as scheduled. Therapist continues to recommend an OT evaluation.  Pt will benefit from skilled PT to progress pt to better function in walking and ADLs.                                 PT Short Term Goals - 12/05/20 1337      PT SHORT TERM GOAL #1   Title Pt will be independent with HEP in order to improve strength and balance in order to decrease fall risk and improve function at home and work.    Time 4    Period Weeks    Status New    Target Date 01/01/21             PT Long Term Goals - 12/05/20 1337      PT LONG TERM GOAL #1   Title Pt will improve FOTO to at least 79 in order to demonstrate clinically significant improvement in function    Baseline 12/04/20: 68    Time 8  Period Weeks    Status New    Target Date 01/29/21      PT LONG TERM GOAL #2   Title Patient will improve left hip flexion and left ankle dorsiflexion to 5/5 in order to decrease her risk of tripping over left foot and falling.    Baseline 12/04/20: both 4+/5    Time 8    Period Weeks    Status New    Target Date 01/29/21      PT LONG TERM GOAL #3   Title Pt will report at least 90% recovery from her stroke in order to resume normal function at home and work    Baseline 12/04/20: 70%    Time 8    Status New    Target Date 01/29/21                 Plan - 12/18/20 1025    Clinical Impression Statement Pt demonstrated great motivation to participate in PT today. Continued to perform LE strengthening exercises today with focus on isolating LLE as much as possible.She reports that she continues to have the most difficulty with overhead strength with LUE as well as L hand fine motor coordination so she was introduced to some lifting and reaching exercises to work on overhead strength.  Pt was also introduced to key pinch exercises, which she tolerated well.. She has continued to work on L hand exercises that were provided while she was at Inpatient rehab.  Pt reports that she is doing well with her HEP and encouraged to follow-up as scheduled. Therapist continues to recommend an OT evaluation.  Pt will benefit from skilled  PT to progress pt to better function in walking and ADLs.    Personal Factors and Comorbidities Comorbidity 1    Comorbidities Migraines,    Examination-Activity Limitations Dressing;Lift    Examination-Participation Restrictions Occupation    Stability/Clinical Decision Making Evolving/Moderate complexity    Rehab Potential Good    PT Frequency 2x / week    PT Duration 8 weeks    PT Treatment/Interventions ADLs/Self Care Home Management;Aquatic Therapy;Canalith Repostioning;Cryotherapy;Electrical Stimulation;Iontophoresis 4mg /ml Dexamethasone;Biofeedback;Moist Heat;Traction;Ultrasound;Contrast Bath;DME Instruction;Gait training;Stair training;Functional mobility training;Therapeutic activities;Therapeutic exercise;Balance training;Neuromuscular re-education;Patient/family education;Manual techniques;Passive range of motion;Dry needling;Vestibular;Spinal Manipulations;Joint Manipulations    PT Next Visit Plan Continue with LE strengthening, ensure OT referral has be received and eval scheduled    PT Home Exercise Plan Access Code: [email or web address]    Consulted and Agree with Plan of Care Patient           Patient will benefit from skilled therapeutic intervention in order to improve the following deficits and impairments:  Decreased strength,Impaired UE functional use,Decreased balance  Visit Diagnosis: Muscle weakness (generalized)     Problem List Patient Active Problem List   Diagnosis Date Noted  . Class 1 obesity due to excess calories with serious comorbidity and body mass index (BMI) of 30.0 to 30.9 in adult   . Slow transit constipation   . Hypoalbuminemia due to protein-calorie malnutrition ([place])   . Marijuana abuse   . Left hemiparesis ([place])   . 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 Dysphagia, post-stroke   . Acute blood loss anemia   . Stroke (cerebrum) ([place]) 11/13/2020  . Internal carotid artery stenosis, right 11/13/2020  . Migraines 10/06/2012    This  entire session was performed under direct supervision and direction of a licensed therapist/therapist assistant . I have personally read, edited and approve of the note as written.  Rosalee Kaufman, SPT  Phillips Grout PT, DPT, GCS  Huprich,Jason 12/18/2020, 3:44 PM  [facility] [address] [place], [place], [address] Phone: [phone]   Fax:  [phone]  Name: Dana Henry MRN: [id number] Date of Birth: 04-17-1981

## 2020-12-23 ENCOUNTER — Other Ambulatory Visit: Payer: Self-pay

## 2020-12-23 ENCOUNTER — Ambulatory Visit: Payer: BC Managed Care – PPO

## 2020-12-23 ENCOUNTER — Telehealth (HOSPITAL_COMMUNITY): Payer: Self-pay

## 2020-12-23 VITALS — BP 122/71 | HR 67

## 2020-12-23 DIAGNOSIS — M6281 Muscle weakness (generalized): Secondary | ICD-10-CM | POA: Diagnosis not present

## 2020-12-23 DIAGNOSIS — R2681 Unsteadiness on feet: Secondary | ICD-10-CM

## 2020-12-23 MED ORDER — PANTOPRAZOLE SODIUM 40 MG PO TBEC: 40.0000 mg | DELAYED_RELEASE_TABLET | Freq: Every day | ORAL | 3 refills | Status: DC

## 2020-12-23 MED ORDER — ATORVASTATIN CALCIUM 40 MG PO TABS: 40.0000 mg | ORAL_TABLET | Freq: Every day | ORAL | 3 refills | Status: DC

## 2020-12-23 NOTE — Addendum Note (Signed)
Addended by: Lamar Blinks C on: 12/23/2020 04:47 PM   Modules accepted: Orders

## 2020-12-23 NOTE — Telephone Encounter (Signed)
Called to schedule f/u, no answer, left vm. AW 

## 2020-12-23 NOTE — Therapy (Signed)
[facility] [address] [place], [place], [address] Phone: [phone]   Fax:  [phone]  Physical Therapy Treatment  Patient Details  Name: Dana Henry MRN: [id number] Date of Birth: [birth year] Referring Provider (PT): Dana Rinne PA-C   Encounter Date: 12/23/2020   PT End of Session - 12/23/20 1532    Visit Number 5    Number of Visits 17    Date for PT Re-Evaluation 01/29/21    Authorization Type eval: 12/04/20    PT Start Time 0930    PT Stop Time 1015    PT Time Calculation (min) 45 min    Activity Tolerance Patient tolerated treatment well    Behavior During Therapy [facility] for tasks assessed/performed           Past Medical History:  Diagnosis Date  . Anemia   . History of cesarean delivery affecting pregnancy 08/10/2017  . Kidney stones    surgically removed at 40YO and 40YO  . Migraines    associated with menstrual cycle  . UTI (urinary tract infection)   . Vaginal Pap smear, abnormal     Past Surgical History:  Procedure Laterality Date  . BUBBLE STUDY  11/18/2020   Procedure: BUBBLE STUDY;  Surgeon: Dana Munroe, MD;  Location: [facility];  Service: Cardiology;;  . CESAREAN SECTION N/A 07/21/2014   Procedure: CESAREAN SECTION;  Surgeon: Elveria Royals, MD;  Location: [place] [facility];  Service: Obstetrics;  Laterality: N/A;  . CESAREAN SECTION N/A 08/10/2017   Procedure: Repeat CESAREAN SECTION;  Surgeon: Azucena Fallen, MD;  Location: [place];  Service: Obstetrics;  Laterality: N/A;  EDD: 08/17/17 Allergy: Sulfa  . IR CT HEAD LTD  11/13/2020  . IR INTRA CRAN STENT  11/13/2020  . IR PERCUTANEOUS ART THROMBECTOMY/INFUSION INTRACRANIAL INC DIAG ANGIO  11/13/2020  . KIDNEY STONE SURGERY    . RADIOLOGY WITH ANESTHESIA N/A 11/13/2020   Procedure: IR WITH ANESTHESIA;  Surgeon: Radiologist, Medication, MD;  Location: [place];  Service: Radiology;  Laterality: N/A;  . TEE WITHOUT CARDIOVERSION N/A  11/18/2020   Procedure: TRANSESOPHAGEAL ECHOCARDIOGRAM (TEE);  Surgeon: Dana Munroe, MD;  Location: [place];  Service: Cardiology;  Laterality: N/A;  . TONSILLECTOMY AND ADENOIDECTOMY  1991    Vitals:   12/23/20 1019  BP: 122/71  Pulse: 67  SpO2: 100%     Subjective Assessment - 12/23/20 1530    Subjective Pt reports that she is doing well today. No changes since last therapy session.  LUE continues to improve in strength.  No pain upon arrival. No specific questions or concerns.    Pertinent History [name] presented to [facility] on 11/13/2020, via EMS with complaints of left-sided weakness. She has a history of COVID infection on 09/19/2020. Neurology Consulted. 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 [name] was admitted to inpatient rehabilitation on 11/18/2020, and discharged home on 11/22/2020. She was maintained on aspirin and Brillinta for CVA prophylaxis. No therapy since discharge. No prior history of CVA. She had a follow-up with physiatry on 11/29/20 and has an OP follow-up with neurology in April 2022. She reports persistent intermittent mild headaches on her right side which are managed with Tylenol. PCP started her on Prozac a few weeks  ago which pt reports has helped with her anxiety. She continues to complain about fatigue since her CVA. Pt reports that her primary issue at this point is with her LUE. She initially had LLE weakness which she believes has mostly resolved although occasionally she feels her L foot drag. She denies any issues with her balance and no reported falls. She reports that her LLE occasionally feels like it is "going to sleep" but otherwise denies N/T. Pt works at [employer] and her job requires  her to be on the computer. Currently out of work until cleared by MD.    Diagnostic tests See history    Patient Stated Goals Improve LUE strength and fine motor coordination    Currently in Pain? No/denies             TREATMENT   Ther-ex NuStep L2-3 x 5 minutes for warm-up during history (4 minutes unbilled); Total Gym L22 single leg squats x 15 BLE; Total Gym L22 L single leg heel raises 2 x 20 BLE; TRX reverse lunges with x 10 BLE; TRX lateral lunges with LLE forward x 10; Seated Purdue Pegboard assembly/disassembly with metal post, washer, spacer, and then washer on top x 5 minutes; Seated Nautilus lat pull down 60# x 20, 70# x 20; Overhead Key pinch theratube braiding along shelf x 3 minutes; Wall push up x 10, verbal and tactile cues for proper hand placement as well as demonstration;   Pt educated throughout session about proper posture and technique with exercises. Improved exercise technique, movement at target joints, use of target muscles after min to mod verbal, visual, tactile cues.   Pt demonstrated great motivation to participate in PT today. Continued to perform LE strengthening exercises today with focus on isolating LLE as much as possible.  The last Purdue pegboard for some fine motor coordination exercises of left upper extremity.  Patient does well with all these exercises and states that her left upper extremity coordination has improved significantly since she was in inpatient rehab.  She is continue to utilize her putty at home for left hand strengthening and patient was provided with red putty to increase challenge at home.  Pt reports that she is doing well with her HEP and encouraged to follow-up as scheduled.  Will update outcome measure and goals at the next session and consider possible discharge if no further deficits are identified at that time.Pt will benefit from skilled PT to progress pt to better function in walking and ADLs.                              PT Short Term Goals - 12/05/20 1337      PT SHORT TERM GOAL #1   Title Pt will be independent with HEP in order to improve strength and balance in order to decrease fall risk and improve function at home and work.    Time 4    Period Weeks    Status New    Target Date 01/01/21             PT Long Term Goals - 12/05/20 1337      PT LONG TERM GOAL #1   Title Pt will improve FOTO to at least 79 in order to demonstrate clinically significant improvement in function    Baseline 12/04/20: 68    Time 8    Period Weeks    Status New    Target Date 01/29/21  PT LONG TERM GOAL #2   Title Patient will improve left hip flexion and left ankle dorsiflexion to 5/5 in order to decrease her risk of tripping over left foot and falling.    Baseline 12/04/20: both 4+/5    Time 8    Period Weeks    Status New    Target Date 01/29/21      PT LONG TERM GOAL #3   Title Pt will report at least 90% recovery from her stroke in order to resume normal function at home and work    Baseline 12/04/20: 70%    Time 8    Status New    Target Date 01/29/21                 Plan - 12/23/20 1535    Clinical Impression Statement Pt demonstrated great motivation to participate in PT today. Continued to perform LE strengthening exercises today with focus on isolating LLE as much as possible.  The last Purdue pegboard for some fine motor coordination exercises of left upper extremity.  Patient does well with all these exercises and states that her left upper extremity coordination has improved significantly since she was in inpatient rehab.  She is continue to utilize her putty at home for left hand strengthening and patient was provided with red putty to increase challenge at home.  Pt reports that she is doing well with her HEP and encouraged to follow-up as scheduled.  Will update outcome measure and goals at the next session and consider possible  discharge if no further deficits are identified at that time. Pt will benefit from skilled PT to progress pt to better function in walking and ADLs.           Patient will benefit from skilled therapeutic intervention in order to improve the following deficits and impairments:  Decreased strength,Impaired UE functional use,Decreased balance  Visit Diagnosis: Muscle weakness (generalized)  Unsteadiness on feet     Problem List Patient Active Problem List   Diagnosis Date Noted  . Class 1 obesity due to excess calories with serious comorbidity and body mass index (BMI) of 30.0 to 30.9 in adult   . Slow transit constipation   . Hypoalbuminemia due to protein-calorie malnutrition ([place])   . Marijuana abuse   . Left hemiparesis ([place])   . 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 Dysphagia, post-stroke   . Acute blood loss anemia   . Stroke (cerebrum) ([place]) 11/13/2020  . Internal carotid artery stenosis, right 11/13/2020  . Migraines 10/06/2012   Phillips Grout PT, DPT, GCS  Huprich,Jason 12/23/2020, 3:37 PM  [facility] [address] [place], [place], [address] Phone: [phone]   Fax:  [phone]  Name: [facility] MRN: [id number] Date of Birth: [birth year]

## 2020-12-25 ENCOUNTER — Telehealth (HOSPITAL_COMMUNITY): Payer: Self-pay

## 2020-12-25 ENCOUNTER — Ambulatory Visit: Payer: BC Managed Care – PPO

## 2020-12-25 ENCOUNTER — Other Ambulatory Visit: Payer: Self-pay

## 2020-12-25 DIAGNOSIS — M6281 Muscle weakness (generalized): Secondary | ICD-10-CM | POA: Diagnosis not present

## 2020-12-25 DIAGNOSIS — R2681 Unsteadiness on feet: Secondary | ICD-10-CM

## 2020-12-25 NOTE — Patient Instructions (Addendum)
Access Code: [email or web address] URL: [email or web address] Date: 12/25/2020 Prepared by: Roxana Hires  Exercises Seated Ankle Eversion with Resistance - 1 x daily - 4 x weekly - 3 sets - 10 reps - 3s hold Ankle Dorsiflexion with Resistance - 1 x daily - 4 x weekly - 3 sets - 10 reps - 3s hold Seated March with Resistance - 1 x daily - 4 x weekly - 3 sets - 10 reps - 3s hold Seated Shoulder Row with Anchored Resistance - 1 x daily - 4 x weekly - 3 sets - 10 reps - 3s hold Seated Shoulder Extension and Scapular Retraction with Resistance - 1 x daily - 4 x weekly - 3 sets - 10 reps - 3s hold Standing Shoulder Flexion with Resistance - 1 x daily - 4 x weekly - 3 sets - 10 reps - 3s hold Standing Single Arm Shoulder Abduction with Resistance - 1 x daily - 4 x weekly - 3 sets - 10 reps - 3s hold Wrist Extension with Resistance - 1 x daily - 4 x weekly - 3 sets - 10 reps - 3s hold Wrist Flexion with Resistance - 1 x daily - 4 x weekly - 3 sets - 10 reps - 3s hold Putty Squeezes - 1 x daily - 4 x weekly - 3 sets - 10 reps - 3s hold

## 2020-12-25 NOTE — Telephone Encounter (Signed)
Called to reschedule consult, no answer, left vm. AW  

## 2020-12-25 NOTE — Therapy (Signed)
[facility] [address] [place], [place], [address] Phone: [phone]   Fax:  [phone]  Physical Therapy Treatment/Discharge  Patient Details  Name: Nardos Putnam MRN: [id number] Date of Birth: August 25, 1981 Referring Provider (PT): Lauraine Rinne PA-C   Encounter Date: 12/25/2020   PT End of Session - 12/25/20 1025    Visit Number 6    Number of Visits 17    Date for PT Re-Evaluation 01/29/21    Authorization Type eval: 12/04/20    PT Start Time 1022    PT Stop Time 1100    PT Time Calculation (min) 38 min    Activity Tolerance Patient tolerated treatment well    Behavior During Therapy [facility] for tasks assessed/performed           Past Medical History:  Diagnosis Date  . Anemia   . History of cesarean delivery affecting pregnancy 08/10/2017  . Kidney stones    surgically removed at 40YO and 40YO  . Migraines    associated with menstrual cycle  . UTI (urinary tract infection)   . Vaginal Pap smear, abnormal     Past Surgical History:  Procedure Laterality Date  . BUBBLE STUDY  11/18/2020   Procedure: BUBBLE STUDY;  Surgeon: Elouise Munroe, MD;  Location: [place];  Service: Cardiology;;  . CESAREAN SECTION N/A 07/21/2014   Procedure: CESAREAN SECTION;  Surgeon: Elveria Royals, MD;  Location: [place] [facility];  Service: Obstetrics;  Laterality: N/A;  . CESAREAN SECTION N/A 08/10/2017   Procedure: Repeat CESAREAN SECTION;  Surgeon: Azucena Fallen, MD;  Location: [place];  Service: Obstetrics;  Laterality: N/A;  EDD: 08/17/17 Allergy: Sulfa  . IR CT HEAD LTD  11/13/2020  . IR INTRA CRAN STENT  11/13/2020  . IR PERCUTANEOUS ART THROMBECTOMY/INFUSION INTRACRANIAL INC DIAG ANGIO  11/13/2020  . KIDNEY STONE SURGERY    . RADIOLOGY WITH ANESTHESIA N/A 11/13/2020   Procedure: IR WITH ANESTHESIA;  Surgeon: Radiologist, Medication, MD;  Location: [place];  Service: Radiology;  Laterality: N/A;  . TEE WITHOUT CARDIOVERSION  N/A 11/18/2020   Procedure: TRANSESOPHAGEAL ECHOCARDIOGRAM (TEE);  Surgeon: Elouise Munroe, MD;  Location: [place];  Service: Cardiology;  Laterality: N/A;  . TONSILLECTOMY AND ADENOIDECTOMY  1991    There were no vitals filed for this visit.   Subjective Assessment - 12/25/20 1024    Subjective Pt reports that she is doing well today. No changes since last therapy session.  LUE continues to improve in strength.  No pain upon arrival. She is agreeable to making today her last PT appointment. No specific questions or concerns.    Pertinent History Ms. Basinski presented to [facility] on 11/13/2020, via EMS with complaints of left-sided weakness. She has a history of COVID infection on 09/19/2020. Neurology Consulted. 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 Ms. Schiraldi was admitted to inpatient rehabilitation on 11/18/2020, and discharged home on 11/22/2020. She was maintained on aspirin and Brillinta for CVA prophylaxis. No therapy since discharge. No prior history of CVA. She had a follow-up with physiatry on 11/29/20 and has an OP follow-up with neurology in April 2022. She reports persistent intermittent mild headaches on her right side which are managed with Tylenol. PCP started her on Prozac a  few weeks ago which pt reports has helped with her anxiety. She continues to complain about fatigue since her CVA. Pt reports that her primary issue at this point is with her LUE. She initially had LLE weakness which she believes has mostly resolved although occasionally she feels her L foot drag. She denies any issues with her balance and no reported falls. She reports that her LLE occasionally feels like it is "going to sleep" but otherwise denies N/T.  Pt works at [employer] and her job requires her to be on the computer. Currently out of work until cleared by MD.    Diagnostic tests See history    Patient Stated Goals Improve LUE strength and fine motor coordination    Currently in Pain? No/denies              TREATMENT   Ther-ex UBE x 4 minutes for warm-up (2 minutes forward/2 minutes backward) during history; Updated goals with patient: FOTO: 92; Pt reports 90-95% improvement in her CVA symptoms since starting therapy;  Total Gym L22 single leg squats 2 x 15 BLE; Total Gym L22 L single leg heel raises 2 x 20 BLE;  MMT: R/L 5/5 Hip flexion 5/5 Hip external rotation 5/5 Hip internal rotation 4/4 Hip extension  4/4 Hip abduction 4/4+ Hip adduction 5/5 Knee extension 5/5 Knee flexion >10 reps/>10 reps Ankle plantarflexion 5/5 Ankle dorsiflexion 5/5 Shoulder flexion 5/4+ Shoulder abduction 5/5 Elbow flexion 5/4+ Elbow extension 5/4+ Wrist extension 5/4+ Wrist flexion Strong and symmetrical finger abduction/adduction Grip strength: R: 53.8, 56.7, 66.2 (avg: 58.9#) L: 44.2, 40.7, 39.4 (avg: 41.4#)   Single leg balance: >30s bilateral;  Stroke Impact Scale (today: 12/25/20) 1. Physical: 81.3%, 2. Memory: 100%, 3. Emotions:88.9%, 4. Communication: 100%, 5. ADL/IADL: 87.5%, 6. Mobility: 100, 7. UE: 85%, Activity/purpose: 75% 9. Recovery: 90%  Stroke Impact Scale (12/04/20 for reference) 1. Physical: 50%, 2. Memory: 92.9%, 3. Emotions:75%, 4. Communication: 100%, 5. ADL/IADL: 80%, 6. Mobility: 91.7%, 7. UE: 45%, Activity/purpose: 53.1% 9. Recovery: 70%    Pt educated throughout session about proper posture and technique with exercises. Improved exercise technique, movement at target joints, use of target muscles after min to mod verbal, visual, tactile cues.   Pt arrived late so session was abbreviated accordingly.  Updated outcome measures and goals with patient today.  Reports approximately 90 to 95% improvement  of her symptoms since her CVA.  Left upper extremity and left lower extremity strength have both improved since starting therapy.  FOTO score increased to 92 from 68 at initial evaluation.  She is independent with her home exercise program and is performing consistently.  She completed the Stroke Impact Scale again today and she demonstrates improvement in all categories with the exception of communication which was already at 100%.  Her grip strength in the left upper extremity has improved however is still outside of the 10 to 15% deviation as compared to her dominant right upper extremity.  She states that she would like to proceed with an OT evaluation but is aware that she may not need any OT intervention. Updated HEP with patient including left upper extremity strengthening with focus on composite gripping, wrist extension, and wrist flexion.  Patient does not demonstrate any further deficits which require PT intervention at this time and is agreeable to discharge.                    PT Education - 12/25/20 1025    Education Details Discharge and HEP  modifications    Person(s) Educated Patient    Methods Explanation    Comprehension Verbalized understanding            PT Short Term Goals - 12/25/20 1026      PT SHORT TERM GOAL #1   Title Pt will be independent with HEP in order to improve strength and balance in order to decrease fall risk and improve function at home and work.    Time 4    Period Weeks    Status Achieved    Target Date --             PT Long Term Goals - 12/25/20 1026      PT LONG TERM GOAL #1   Title Pt will improve FOTO to at least 79 in order to demonstrate clinically significant improvement in function    Baseline 12/04/20: 68; 12/25/20: 92    Time 8    Period Weeks    Status Achieved      PT LONG TERM GOAL #2   Title Patient will improve left hip flexion and left ankle dorsiflexion to 5/5 in order to decrease her risk of tripping over  left foot and falling.    Baseline 12/04/20: both 4+/5; 12/25/20: both 5/5    Time 8    Period Weeks    Status Achieved      PT LONG TERM GOAL #3   Title Pt will report at least 90% recovery from her stroke in order to resume normal function at home and work    Baseline 12/04/20: 70%; 12/25/20: 90-95%    Time 8    Status Achieved                 Plan - 12/25/20 1025    Clinical Impression Statement Pt arrived late so session was abbreviated accordingly.  Updated outcome measures and goals with patient today.  Reports approximately 90 to 95% improvement of her symptoms since her CVA.  Left upper extremity and left lower extremity strength have both improved since starting therapy.  FOTO score increased to 92 from 68 at initial evaluation.  She is independent with her home exercise program and is performing consistently.  She completed the Stroke Impact Scale again today and she demonstrates improvement in all categories with the exception of communication which was already at 100%.  Her grip strength in the left upper extremity has improved however is still outside of the 10 to 15% deviation as compared to her dominant right upper extremity.  She states that she would like to proceed with an OT evaluation but is aware that she may not need any OT intervention. Updated HEP with patient including left upper extremity strengthening with focus on composite gripping, wrist extension, and wrist flexion.  Patient does not demonstrate any further deficits which require PT intervention at this time and is agreeable to discharge.    Personal Factors and Comorbidities Comorbidity 1    Comorbidities Migraines,    Examination-Activity Limitations Dressing;Lift    Examination-Participation Restrictions Occupation    Stability/Clinical Decision Making Evolving/Moderate complexity    Rehab Potential Good    PT Frequency 2x / week    PT Duration 8 weeks    PT Treatment/Interventions ADLs/Self Care Home  Management;Aquatic Therapy;Canalith Repostioning;Cryotherapy;Electrical Stimulation;Iontophoresis 4mg /ml Dexamethasone;Biofeedback;Moist Heat;Traction;Ultrasound;Contrast Bath;DME Instruction;Gait training;Stair training;Functional mobility training;Therapeutic activities;Therapeutic exercise;Balance training;Neuromuscular re-education;Patient/family education;Manual techniques;Passive range of motion;Dry needling;Vestibular;Spinal Manipulations;Joint Manipulations    PT Next Visit Plan Update outcome measures and goals, consider possible discharge  if no further deficits identified, continue with LE strengthening,    PT Home Exercise Plan Access Code: [email or web address]    Consulted and Agree with Plan of Care Patient           Patient will benefit from skilled therapeutic intervention in order to improve the following deficits and impairments:  Decreased strength,Impaired UE functional use,Decreased balance  Visit Diagnosis: Muscle weakness (generalized)  Unsteadiness on feet     Problem List Patient Active Problem List   Diagnosis Date Noted  . Class 1 obesity due to excess calories with serious comorbidity and body mass index (BMI) of 30.0 to 30.9 in adult   . Slow transit constipation   . Hypoalbuminemia due to protein-calorie malnutrition ([place])   . Marijuana abuse   . Left hemiparesis ([place])   . Right middle cerebral artery stroke ([place]) 11/18/2020  . Dysphagia, post-stroke   . Acute blood loss anemia   . Stroke (cerebrum) ([place]) 11/13/2020  . Internal carotid artery stenosis, right 11/13/2020  . Migraines 10/06/2012   Phillips Grout PT, DPT, GCS  Rucker Pridgeon 12/25/2020, 2:12 PM  [facility] [address] [place], [place], [address] Phone: [phone]   Fax:  [phone]  Name: Monique Hefty MRN: [id number] Date of Birth: [birth year]

## 2020-12-29 ENCOUNTER — Other Ambulatory Visit: Payer: Self-pay | Admitting: Registered Nurse

## 2020-12-30 ENCOUNTER — Ambulatory Visit: Payer: BC Managed Care – PPO

## 2020-12-30 ENCOUNTER — Telehealth (HOSPITAL_COMMUNITY): Payer: Self-pay

## 2020-12-30 NOTE — Telephone Encounter (Signed)
Called to reschedule consult, no answer, left vm. AW  

## 2021-01-02 ENCOUNTER — Ambulatory Visit (HOSPITAL_COMMUNITY): Admission: RE | Admit: 2021-01-02 | Payer: BC Managed Care – PPO | Source: Ambulatory Visit

## 2021-01-04 NOTE — Progress Notes (Signed)
[place] at [facility] [address], [place], [place] [address] [phone]  Date:  01/08/2021   Name:  Dana Henry   DOB:  Apr 20, 1981   MRN:  [id number]  PCP:  Darreld Mclean, MD    Chief Complaint: Hospitalization Follow-up   History of Present Illness:  Dana Henry is a 40 y.o. very pleasant female patient who presents with the following:  Following up today- she suffered a stroke earlier this year and this is our first visit since that time Date of Admission: 11/13/2020 Date of Discharge: 11/18/2020- then rehab until 11/22/20  Attending Physician:  Garvin Fila, MD, Stroke MD Consultant(s):   cardiology Patient's PCP:  Darreld Mclean, MD  Discharge Diagnoses:  1. 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 3.  Left hemiparesis 4.  Small PFO likely incidental Active Problems:   Stroke (cerebrum) (HCC)   Internal carotid artery stenosis, right   Dysphagia, post-stroke   Acute blood loss anemia  She notes that her sx started with a headache, she did not think too much of it, she thought it was stress.  The HA was on the right side of her head, she also did not feel right in general.  Her symptoms escalated over several days Her left arm went "totally limp" and she was having a hard time with her left leg as well.  Finally she was feeling so bad that her husband took her to the ER  They had to call 911 as she was not able to stand and walk Thankfully, she is recovered very well She is getting most of her left side movement back- she feels like she is 95% back to normal She is not yet driving She is no longer doing any PT, she does have OT scheduled for April to check on her hand  function She is not back at work yet- she plans to return on 5/2- she works in [employer] in [employer]  She is seeing neurology on 01/17/21 She is having a procedure to visualize her carotids next week  Just since her stroke she has had more constipation- she is using miralax prn  No blood in her stool  She is taking Brilinta and aspirin Former smoker   Patient Active Problem List   Diagnosis Date Noted  . Class 1 obesity due to excess calories with serious comorbidity and body mass index (BMI) of 30.0 to 30.9 in adult   . Slow transit constipation   . Hypoalbuminemia due to protein-calorie malnutrition ([place])   . Marijuana abuse   . Left hemiparesis ([place])   . Right middle cerebral artery stroke ([place]) 11/18/2020  . Dysphagia, post-stroke   . Acute blood loss anemia   . Stroke (cerebrum) ([place]) 11/13/2020  . Internal carotid artery stenosis, right 11/13/2020  . Migraines 10/06/2012    Past Medical History:  Diagnosis Date  . Anemia   . History of cesarean delivery affecting pregnancy 08/10/2017  . Kidney stones    surgically removed at 40YO and 40YO  . Migraines    associated with menstrual cycle  . UTI (urinary tract infection)   . Vaginal Pap smear, abnormal     Past Surgical History:  Procedure Laterality Date  . BUBBLE  STUDY  11/18/2020   Procedure: BUBBLE STUDY;  Surgeon: Elouise Munroe, MD;  Location: [place];  Service: Cardiology;;  . CESAREAN SECTION N/A 07/21/2014   Procedure: CESAREAN SECTION;  Surgeon: Elveria Royals, MD;  Location: [facility];  Service: Obstetrics;  Laterality: N/A;  . CESAREAN SECTION N/A 08/10/2017   Procedure: Repeat CESAREAN SECTION;  Surgeon: Azucena Fallen, MD;  Location: [place];  Service: Obstetrics;  Laterality: N/A;  EDD: 08/17/17 Allergy: Sulfa  . IR CT HEAD LTD  11/13/2020  . IR INTRA CRAN STENT  11/13/2020  . IR PERCUTANEOUS ART THROMBECTOMY/INFUSION INTRACRANIAL INC DIAG ANGIO  11/13/2020  . KIDNEY STONE  SURGERY    . RADIOLOGY WITH ANESTHESIA N/A 11/13/2020   Procedure: IR WITH ANESTHESIA;  Surgeon: Radiologist, Medication, MD;  Location: [place];  Service: Radiology;  Laterality: N/A;  . TEE WITHOUT CARDIOVERSION N/A 11/18/2020   Procedure: TRANSESOPHAGEAL ECHOCARDIOGRAM (TEE);  Surgeon: Elouise Munroe, MD;  Location: [place];  Service: Cardiology;  Laterality: N/A;  . TONSILLECTOMY AND ADENOIDECTOMY  1991    Social History   Tobacco Use  . Smoking status: Former Smoker    Packs/day: 0.50    Types: Cigarettes    Quit date: 10/19/2013    Years since quitting: 7.2  . Smokeless tobacco: Never Used  Vaping Use  . Vaping Use: Never used  Substance Use Topics  . Alcohol use: Yes    Comment: occassional  . Drug use: No    Family History  Problem Relation Age of Onset  . Hypertension Mother   . Alcohol abuse Father     Allergies  Allergen Reactions  . Sulfa Antibiotics Hives and Swelling    Medication list has been reviewed and updated.  Current Outpatient Medications on File Prior to Visit  Medication Sig Dispense Refill  . aspirin 81 MG chewable tablet Chew 1 tablet (81 mg total) by mouth daily.    Marland Kitchen atorvastatin (LIPITOR) 40 MG tablet Take 1 tablet (40 mg total) by mouth daily. 90 tablet 3  . BRILINTA 90 MG TABS tablet TAKE 1 TABLET BY MOUTH 2 TIMES DAILY. 60 tablet 0  . FLUoxetine (PROZAC) 20 MG tablet Take 1 tablet (20 mg total) by mouth daily. Increase to 40 mg after 2 weeks 60 tablet 5  . Multiple Vitamins-Minerals (MULTIVITAMIN WITH MINERALS) tablet Take 1 tablet by mouth daily.    . pantoprazole (PROTONIX) 40 MG tablet Take 1 tablet (40 mg total) by mouth daily. 90 tablet 3  . polyethylene glycol (MIRALAX / GLYCOLAX) 17 g packet Take 17 g by mouth 2 (two) times daily. 14 each 0   No current facility-administered medications on file prior to visit.    Review of Systems:  As per HPI- otherwise negative.   Physical Examination: Vitals:   01/08/21 1124  BP:  124/82  Pulse: 66  Resp: 16  SpO2: 98%   Vitals:   01/08/21 1124  Weight: 175 lb (79.4 kg)  Height: 5\' 4"  (1.626 m)   Body mass index is 30.04 kg/m. Ideal Body Weight: Weight in (lb) to have BMI = 25: 145.3  GEN: no acute distress.  Overweight, looks well HEENT: Atraumatic, Normocephalic.  Bilateral TM wnl, oropharynx normal.  PEERL,EOMI.   No facial droop is noted.  Normal facial sensation and movement Ears and Nose: No external deformity. CV: RRR, No M/G/R. No JVD. No thrill. No extra heart sounds. PULM: CTA B, no wheezes, crackles, rhonchi. No retractions. No resp. distress. No accessory  muscle use. ABD: S, NT, ND, +BS. No rebound. No HSM. EXTR: No c/c/e PSYCH: Normally interactive. Conversant.  The patient is a very minor strength deficit in the left upper extremity.  Otherwise extremity strength, sensation, DTR is all normal  Assessment and Plan: Hospital discharge follow-up  Ischemic stroke [facility])  Patient is here today for follow-up.  She very unfortunately suffered a stroke earlier this year due to a blockage in her carotid artery.  This has been treated and she has recovered very nicely, nearly back to normal She is currently feeling well and is pleased with her progress She plans to follow-up with physical medicine rehab, interventional radiology, and neurology within the next 4 weeks She and her husband are considering having another child at some point.  I am not certain about her risk profile with history of stroke.  I asked her to query her neurologist about this issue at her next visit  Otherwise we plan to follow-up with each other in June This visit occurred during the SARS-CoV-2 public health emergency.  Safety protocols were in place, including screening questions prior to the visit, additional usage of staff PPE, and extensive cleaning of exam room while observing appropriate contact time as indicated for disinfecting solutions.    Signed Lamar Blinks,  MD

## 2021-01-06 ENCOUNTER — Ambulatory Visit (HOSPITAL_COMMUNITY)
Admission: RE | Admit: 2021-01-06 | Discharge: 2021-01-06 | Disposition: A | Discharge status: Home / Self Care | Payer: BC Managed Care – PPO | Source: Ambulatory Visit | Attending: Interventional Radiology | Admitting: Interventional Radiology

## 2021-01-06 ENCOUNTER — Other Ambulatory Visit: Payer: Self-pay

## 2021-01-06 DIAGNOSIS — I639 Cerebral infarction, unspecified: Secondary | ICD-10-CM

## 2021-01-06 NOTE — Progress Notes (Signed)
Chief Complaint: Patient was seen in consultation today for stroke follow-up.  Referring Physician(s): Code stroke- [name] (neuorology)  Supervising Physician: Luanne Bras  Patient Status: [facility] - [facility]  History of Present Illness: Dana Henry is a 40 y.o. female with a past medical history as below, with pertinent past medical history including migraines, CVA 10/2020, nephrolithiasis, and anemia. She is known to [facility] and has been followed by Dr. Estanislado Pandy since 10/2020. She first presented to our department as an active code stroke. She underwent an image-guided cerebral arteriogram with emergent revascularization of right ICA petrous severe stenosis using stent assisted angioplasty via right femoral approach 11/13/2020 by Dr. Estanislado Pandy. She was discharged to [facility] 11/18/2020 and discharged home 11/22/2020 in stable condition.  Patient presents today for follow-up regarding her recent stent placement. Patient awake and alert sitting in chair with no complaints at this time. States LUE weakness has almost completely resolved.   Past Medical History:  Diagnosis Date  . Anemia   . History of cesarean delivery affecting pregnancy 08/10/2017  . Kidney stones    surgically removed at 40YO and 40YO  . Migraines    associated with menstrual cycle  . UTI (urinary tract infection)   . Vaginal Pap smear, abnormal     Past Surgical History:  Procedure Laterality Date  . BUBBLE STUDY  11/18/2020   Procedure: BUBBLE STUDY;  Surgeon: Elouise Munroe, MD;  Location: [place];  Service: Cardiology;;  . CESAREAN SECTION N/A 07/21/2014   Procedure: CESAREAN SECTION;  Surgeon: Elveria Royals, MD;  Location: [facility];  Service: Obstetrics;  Laterality: N/A;  . CESAREAN SECTION N/A 08/10/2017   Procedure: Repeat CESAREAN SECTION;  Surgeon: Azucena Fallen, MD;  Location: [place];  Service: Obstetrics;  Laterality: N/A;  EDD: 08/17/17 Allergy: Sulfa  . IR CT HEAD LTD   11/13/2020  . IR INTRA CRAN STENT  11/13/2020  . IR PERCUTANEOUS ART THROMBECTOMY/INFUSION INTRACRANIAL INC DIAG ANGIO  11/13/2020  . KIDNEY STONE SURGERY    . RADIOLOGY WITH ANESTHESIA N/A 11/13/2020   Procedure: IR WITH ANESTHESIA;  Surgeon: Radiologist, Medication, MD;  Location: [place];  Service: Radiology;  Laterality: N/A;  . TEE WITHOUT CARDIOVERSION N/A 11/18/2020   Procedure: TRANSESOPHAGEAL ECHOCARDIOGRAM (TEE);  Surgeon: Elouise Munroe, MD;  Location: [place];  Service: Cardiology;  Laterality: N/A;  . TONSILLECTOMY AND ADENOIDECTOMY  1991    Allergies: Sulfa antibiotics  Medications: Prior to Admission medications   Medication Sig Start Date End Date Taking? Authorizing Provider  acetaminophen (TYLENOL) 325 MG tablet Take 2 tablets (650 mg total) by mouth every 4 (four) hours as needed for mild pain (or temp > 37.5 C (99.5 F)). 11/21/20   Angiulli, Lavon Paganini, PA-C  aspirin 81 MG chewable tablet Chew 1 tablet (81 mg total) by mouth daily. 11/21/20   Angiulli, Lavon Paganini, PA-C  atorvastatin (LIPITOR) 40 MG tablet Take 1 tablet (40 mg total) by mouth daily. 12/23/20   Copland, Gay Filler, MD  BRILINTA 90 MG TABS tablet TAKE 1 TABLET BY MOUTH 2 TIMES DAILY. 12/30/20   Bayard Hugger, NP  FLUoxetine (PROZAC) 20 MG tablet Take 1 tablet (20 mg total) by mouth daily. Increase to 40 mg after 2 weeks 11/26/20   Copland, Gay Filler, MD  Multiple Vitamins-Minerals (MULTIVITAMIN WITH MINERALS) tablet Take 1 tablet by mouth daily.    [provider]  pantoprazole (PROTONIX) 40 MG tablet Take 1 tablet (40 mg total) by mouth daily. 12/23/20  Copland, Gay Filler, MD  polyethylene glycol (MIRALAX / GLYCOLAX) 17 g packet Take 17 g by mouth 2 (two) times daily. 11/21/20   Angiulli, Lavon Paganini, PA-C     Family History  Problem Relation Age of Onset  . Hypertension Mother   . Alcohol abuse Father     Social History   Socioeconomic History  . Marital status: Married    Spouse name: Jennalyn Cawley   . Number of children: Not on file  . Years of education: College  . Highest education level: Not on file  Occupational History  . Occupation: Press photographer  Tobacco Use  . Smoking status: Former Smoker    Packs/day: 0.50    Types: Cigarettes    Quit date: 10/19/2013    Years since quitting: 7.2  . Smokeless tobacco: Never Used  Vaping Use  . Vaping Use: Never used  Substance and Sexual Activity  . Alcohol use: Yes    Comment: occassional  . Drug use: No  . Sexual activity: Yes    Birth control/protection: None  Other Topics Concern  . Not on file  Social History Narrative   Lives in [place] with husband and mother and father and mother in Sports coach. Daughter 42months, Huel Coventry.      Work - Manufacturing systems engineer in [employer]      Diet - regular   Exercise - occasional walks      Caffinated Beverages: Yes   Herbal Remedies: No   Seat Belts: Yes   Bike Helmet: Yes   Exercise 3 Times a Week: No   Vegetarian: No   Eat Dairy Products:   Take Vitamins: Yes   Use Hearing Aid: No   Wear Dentures: No   Smoke Alarms in Home: Yes   Guns/Firearms: No   Physical Abuse: No      Hours of Sleep: 6-7   # of People in Home: 3               Social Determinants of Health   Financial Resource Strain: Not on file  Food Insecurity: Not on file  Transportation Needs: Not on file  Physical Activity: Not on file  Stress: Not on file  Social Connections: Not on file     Review of Systems: A 12 point ROS discussed and pertinent positives are indicated in the HPI above.  All other systems are negative.  Review of Systems  Constitutional: Negative for chills and fever.  Respiratory: Negative for shortness of breath.   Cardiovascular: Negative for chest pain.  Neurological: Positive for weakness.  Psychiatric/Behavioral: Negative for behavioral problems and confusion.    Vital Signs: There were no vitals taken for this visit.  Physical Exam Vitals and nursing note reviewed.   Constitutional:      General: She is not in acute distress. Pulmonary:     Effort: Pulmonary effort is normal. No respiratory distress.  Skin:    General: Skin is warm and dry.  Neurological:     Mental Status: She is alert and oriented to person, place, and time.     Comments: Can spontaneously move all extremities.      Imaging: No results found.  Labs:  CBC: Recent Labs    11/16/20 0151 11/17/20 0112 11/18/20 0154 11/19/20 0500  WBC 6.2 6.2 6.5 6.4  HGB 10.5* 11.4* 11.3* 11.8*  HCT 30.0* 31.6* 31.7* 34.9*  PLT 193 222 216 229    COAGS: Recent Labs    11/13/20 1023  INR 1.1  APTT 28    BMP: Recent Labs    11/16/20 0151 11/17/20 0112 11/18/20 0154 11/19/20 0500  NA 136 137 136 138  K 3.4* 3.4* 3.3* 3.6  CL 108 106 106 106  CO2 20* 20* 20* 21*  GLUCOSE 89 101* 95 94  BUN 10 9 10 13   CALCIUM 8.2* 8.7* 9.0 9.1  CREATININE 0.81 0.82 0.81 0.86  GFRNONAA >60 >60 >60 >60    LIVER FUNCTION TESTS: Recent Labs    03/21/20 1050 11/13/20 1023 11/19/20 0500  BILITOT 0.5 1.3* 0.9  AST 17 27 28   ALT 18 37 34  ALKPHOS 32* 33* 30*  PROT 6.7 7.4 6.3*  ALBUMIN 4.5 4.3 3.5     Assessment and Plan:  History of acute CVA s/p cerebral arteriogram with emergent stent assisted angioplasty of right ICA petrous stenosis achieving a TICI 3 revascularization via right femoral approach 11/13/2020 by Dr. Estanislado Pandy. Informed patient that Dr. Estanislado Pandy was in an emergent procedure requiring his immediate attention at this time, therefore she will be meeting with me today. Patient conveys understanding and agrees to meet with me. Patient states that she is doing well post-stroke. States that her LUE weakness has almost completely resolved. Denies new stroke-like symptoms. States she has scheduled follow-up with neurology next month. Discussed stent management including DAPT use and routine imaging scans to monitor for changes.  Plan for follow-up with an image-guided  diagnostic cerebral arteriogram, first available. Informed patient that our schedulers will call her to set up this imaging scan. Instructed patient to continue taking Brilinta 90 mg twice daily and Aspirin 81 mg once daily. Instructed patient to follow-up with neurology as directed. Instructed patient to call 911 if she develops stroke-like symptoms (weakness, dizziness, vision changes, speech difficulty, gait instability).  All questions answered and concerns addressed. Patient conveys understanding and agrees with plan.  Thank you for this interesting consult.  I greatly enjoyed meeting Alveda Vanhorne and look forward to participating in their care.  A copy of this report was sent to the requesting provider on this date.  Electronically Signed: Earley Abide, PA-C 01/06/2021, 1:24 PM   I spent a total of 25 Minutes in face to face in clinical consultation, greater than 50% of which was counseling/coordinating care for CVA s/p revascularization.

## 2021-01-07 ENCOUNTER — Other Ambulatory Visit (HOSPITAL_COMMUNITY): Payer: Self-pay | Admitting: Interventional Radiology

## 2021-01-07 DIAGNOSIS — I639 Cerebral infarction, unspecified: Secondary | ICD-10-CM

## 2021-01-08 ENCOUNTER — Encounter: Payer: Self-pay | Admitting: Family Medicine

## 2021-01-08 ENCOUNTER — Ambulatory Visit (INDEPENDENT_AMBULATORY_CARE_PROVIDER_SITE_OTHER): Payer: BC Managed Care – PPO | Admitting: Family Medicine

## 2021-01-08 ENCOUNTER — Other Ambulatory Visit: Payer: Self-pay

## 2021-01-08 VITALS — BP 124/82 | HR 66 | Resp 16 | Ht 64.0 in | Wt 175.0 lb

## 2021-01-08 DIAGNOSIS — Z09 Encounter for follow-up examination after completed treatment for conditions other than malignant neoplasm: Secondary | ICD-10-CM | POA: Diagnosis not present

## 2021-01-08 DIAGNOSIS — I639 Cerebral infarction, unspecified: Secondary | ICD-10-CM | POA: Diagnosis not present

## 2021-01-08 NOTE — Patient Instructions (Addendum)
It was good to see you today- I am so glad you are ok!  I suspect constipation may be due to medications you are taking- I would suggest using a stool softener such as docusate sodium daily as a preventative. Also ok to use miralax as needed   Please ask your neurologist about pregnancy- I am not sure if this would be risky or not a concern!  Otherwise continue what you are doing as far as your medications.   Let me know if any concerns

## 2021-01-09 ENCOUNTER — Encounter: Payer: Self-pay | Admitting: Physical Medicine & Rehabilitation

## 2021-01-09 ENCOUNTER — Encounter: Payer: BC Managed Care – PPO | Attending: Registered Nurse | Admitting: Physical Medicine & Rehabilitation

## 2021-01-09 VITALS — BP 115/80 | HR 67 | Temp 98.6°F | Ht 64.0 in | Wt 173.8 lb

## 2021-01-09 DIAGNOSIS — K5901 Slow transit constipation: Secondary | ICD-10-CM | POA: Diagnosis not present

## 2021-01-09 DIAGNOSIS — G8194 Hemiplegia, unspecified affecting left nondominant side: Secondary | ICD-10-CM

## 2021-01-09 DIAGNOSIS — I63511 Cerebral infarction due to unspecified occlusion or stenosis of right middle cerebral artery: Secondary | ICD-10-CM

## 2021-01-09 DIAGNOSIS — G44229 Chronic tension-type headache, not intractable: Secondary | ICD-10-CM | POA: Diagnosis not present

## 2021-01-09 NOTE — Progress Notes (Signed)
Subjective:    Patient ID: Dana Henry, female    DOB: 1981/09/06, 40 y.o.   MRN: [id number]  HPI Right-handed female with history of migraine headaches, Covid infection 09/2020 and remote tobacco abuse presents for follow up for acute ischemic right MCA infarction with emergent stenting angioplasty right ICA per interventional radiology.   Admit date: 11/18/2020 Discharge date: 11/22/2020  Last clinic visit on 11/29/20 with NP, notes reviewed.  Since that time, pt states she was released from therapies. She sees Neurology in weeks. She sees VIR next week. She is constipated, benefit with Miralax. Denies falls. Did not require DME.  Not using assistive device.   Pain Inventory Average Pain 0 Pain Right Now 0 My pain is No pain here for stroke follow up  LOCATION OF PAIN  No pain  BOWEL Number of stools per week: 3 Oral laxative use Yes  Type of laxative Miralax Enema or suppository use No  History of colostomy No  Incontinent No   BLADDER Normal In and out cath, frequency  N/A Able to self cath No  Bladder incontinence No  Frequent urination No  Leakage with coughing No  Difficulty starting stream No  Incomplete bladder emptying No    Mobility ability to climb steps?  yes do you drive?  no Do you have any goals in this area?  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  no  Physicians involved in your care Any changes since last visit?  no   Family History  Problem Relation Age of Onset  . Hypertension Mother   . Alcohol abuse Father    Social History   Socioeconomic History  . Marital status: Married    Spouse name: Verdia Bolt  . Number of children: Not on file  . Years of education: College  . Highest education level: Not on file  Occupational History  . Occupation: Press photographer  Tobacco Use  . Smoking status:  Former Smoker    Packs/day: 0.50    Types: Cigarettes    Quit date: 10/19/2013    Years since quitting: 7.2  . Smokeless tobacco: Never Used  Vaping Use  . Vaping Use: Never used  Substance and Sexual Activity  . Alcohol use: Yes    Comment: occassional  . Drug use: No  . Sexual activity: Yes    Birth control/protection: None  Other Topics Concern  . Not on file  Social History Narrative   Lives in [place] with husband and mother and father and mother in Sports coach. Daughter 33months, Huel Coventry.      Work - Manufacturing systems engineer in [employer]      Diet - regular   Exercise - occasional walks      Caffinated Beverages: Yes   Herbal Remedies: No   Seat Belts: Yes   Bike Helmet: Yes   Exercise 3 Times a Week: No   Vegetarian: No   Eat Dairy Products:   Take Vitamins: Yes   Use Hearing Aid: No   Wear Dentures: No   Smoke Alarms in Home: Yes   Guns/Firearms: No   Physical Abuse: No      Hours of Sleep: 6-7   # of People in Home: 3               Social Determinants of Health   Financial Resource Strain: Not on  file  Food Insecurity: Not on file  Transportation Needs: Not on file  Physical Activity: Not on file  Stress: Not on file  Social Connections: Not on file   Past Surgical History:  Procedure Laterality Date  . BUBBLE STUDY  11/18/2020   Procedure: BUBBLE STUDY;  Surgeon: Elouise Munroe, MD;  Location: [place];  Service: Cardiology;;  . CESAREAN SECTION N/A 07/21/2014   Procedure: CESAREAN SECTION;  Surgeon: Elveria Royals, MD;  Location: [facility];  Service: Obstetrics;  Laterality: N/A;  . CESAREAN SECTION N/A 08/10/2017   Procedure: Repeat CESAREAN SECTION;  Surgeon: Azucena Fallen, MD;  Location: [place];  Service: Obstetrics;  Laterality: N/A;  EDD: 08/17/17 Allergy: Sulfa  . IR CT HEAD LTD  11/13/2020  . IR INTRA CRAN STENT  11/13/2020  . IR PERCUTANEOUS ART THROMBECTOMY/INFUSION INTRACRANIAL INC DIAG ANGIO  11/13/2020  . KIDNEY STONE SURGERY     . RADIOLOGY WITH ANESTHESIA N/A 11/13/2020   Procedure: IR WITH ANESTHESIA;  Surgeon: Radiologist, Medication, MD;  Location: [place];  Service: Radiology;  Laterality: N/A;  . TEE WITHOUT CARDIOVERSION N/A 11/18/2020   Procedure: TRANSESOPHAGEAL ECHOCARDIOGRAM (TEE);  Surgeon: Elouise Munroe, MD;  Location: [place];  Service: Cardiology;  Laterality: N/A;  . TONSILLECTOMY AND ADENOIDECTOMY  1991   Past Medical History:  Diagnosis Date  . Anemia   . History of cesarean delivery affecting pregnancy 08/10/2017  . Kidney stones    surgically removed at 40YO and 40YO  . Migraines    associated with menstrual cycle  . UTI (urinary tract infection)   . Vaginal Pap smear, abnormal    BP 115/80   Pulse 67   Temp 98.6 F (37 C)   Ht 5\' 4"  (1.626 m)   Wt 173 lb 12.8 oz (78.8 kg)   LMP 12/17/2020   SpO2 97%   BMI 29.83 kg/m   Opioid Risk Score:   Fall Risk Score:  `1  Depression screen PHQ 2/9  Depression screen [facility] 2/9 11/29/2020 03/11/2018 10/06/2012  Decreased Interest 1 0 0  Down, Depressed, Hopeless 1 0 0  PHQ - 2 Score 2 0 0  Altered sleeping 1 - -  Tired, decreased energy 2 - -  Change in appetite 0 - -  Feeling bad or failure about yourself  0 - -  Trouble concentrating 0 - -  Moving slowly or fidgety/restless 0 - -  Suicidal thoughts 0 - -  PHQ-9 Score 5 - -   Review of Systems  Gastrointestinal: Positive for constipation.  Neurological: Positive for headaches.  All other systems reviewed and are negative.      Objective:   Physical Exam  Constitutional: No distress . Vital signs reviewed. HENT: Normocephalic.  Atraumatic. Eyes: EOMI. No discharge. Cardiovascular: No JVD.   Respiratory: Normal effort.  No stridor.   GI: Non-distended.   Skin: Warm and dry.  Intact. Psych: Normal mood.  Normal behavior. Musc: No edema in extremities.  No tenderness in extremities. Neuro: Alert Makes eye contact with examiner.  Follows commands.  Motor: LUE: 4+/5  proximal distal LLE: 4+-5/5 proximal to distal    Assessment & Plan:  Right-handed female with history of migraine headaches, Covid infection 09/2020 and remote tobacco abuse presents for follow up for acute ischemic right MCA infarction with emergent stenting angioplasty right ICA per interventional radiology.  1. Left side hemiparesis secondary to acute ischemic right MCA infarction with emergent stenting angioplasty right ICA per interventional radiology.  Completed therapies, cont HEP  Follow up with Neuro  Follow up with VIR  Plans to return to work on 5/2 - works from home  2. Slow transit constipation  Encouraged daily use of Miralax  3. Headaches  Cont prn tylenol

## 2021-01-15 ENCOUNTER — Other Ambulatory Visit: Payer: Self-pay | Admitting: Student

## 2021-01-16 ENCOUNTER — Ambulatory Visit (HOSPITAL_COMMUNITY)
Admission: RE | Admit: 2021-01-16 | Discharge: 2021-01-16 | Disposition: A | Discharge status: Home / Self Care | Payer: BC Managed Care – PPO | Source: Ambulatory Visit | Attending: Interventional Radiology | Admitting: Interventional Radiology

## 2021-01-16 ENCOUNTER — Other Ambulatory Visit: Payer: Self-pay

## 2021-01-16 ENCOUNTER — Encounter (HOSPITAL_COMMUNITY): Payer: Self-pay

## 2021-01-16 DIAGNOSIS — I639 Cerebral infarction, unspecified: Secondary | ICD-10-CM

## 2021-01-16 DIAGNOSIS — Z8616 Personal history of COVID-19: Secondary | ICD-10-CM | POA: Insufficient documentation

## 2021-01-16 DIAGNOSIS — R531 Weakness: Secondary | ICD-10-CM | POA: Insufficient documentation

## 2021-01-16 DIAGNOSIS — I63231 Cerebral infarction due to unspecified occlusion or stenosis of right carotid arteries: Secondary | ICD-10-CM | POA: Diagnosis not present

## 2021-01-16 DIAGNOSIS — Z79899 Other long term (current) drug therapy: Secondary | ICD-10-CM | POA: Diagnosis not present

## 2021-01-16 DIAGNOSIS — Z882 Allergy status to sulfonamides status: Secondary | ICD-10-CM | POA: Diagnosis not present

## 2021-01-16 DIAGNOSIS — Z8673 Personal history of transient ischemic attack (TIA), and cerebral infarction without residual deficits: Secondary | ICD-10-CM | POA: Diagnosis not present

## 2021-01-16 DIAGNOSIS — I63511 Cerebral infarction due to unspecified occlusion or stenosis of right middle cerebral artery: Secondary | ICD-10-CM | POA: Diagnosis not present

## 2021-01-16 DIAGNOSIS — I6521 Occlusion and stenosis of right carotid artery: Secondary | ICD-10-CM | POA: Diagnosis not present

## 2021-01-16 DIAGNOSIS — Z7982 Long term (current) use of aspirin: Secondary | ICD-10-CM | POA: Diagnosis not present

## 2021-01-16 DIAGNOSIS — I671 Cerebral aneurysm, nonruptured: Secondary | ICD-10-CM | POA: Diagnosis not present

## 2021-01-16 HISTORY — PX: IR ANGIO INTRA EXTRACRAN SEL COM CAROTID INNOMINATE BILAT MOD SED: IMG5360

## 2021-01-16 LAB — CBC WITH DIFFERENTIAL/PLATELET
Abs Immature Granulocytes: 0.02 10*3/uL (ref 0.00–0.07)
Basophils Absolute: 0 10*3/uL (ref 0.0–0.1)
Basophils Relative: 1 %
Eosinophils Absolute: 0.2 10*3/uL (ref 0.0–0.5)
Eosinophils Relative: 3 %
HCT: 38.3 % (ref 36.0–46.0)
Hemoglobin: 12.8 g/dL (ref 12.0–15.0)
Immature Granulocytes: 0 %
Lymphocytes Relative: 30 %
Lymphs Abs: 2 10*3/uL (ref 0.7–4.0)
MCH: 30.3 pg (ref 26.0–34.0)
MCHC: 33.4 g/dL (ref 30.0–36.0)
MCV: 90.5 fL (ref 80.0–100.0)
Monocytes Absolute: 0.5 10*3/uL (ref 0.1–1.0)
Monocytes Relative: 7 %
Neutro Abs: 4 10*3/uL (ref 1.7–7.7)
Neutrophils Relative %: 59 %
Platelets: 249 10*3/uL (ref 150–400)
RBC: 4.23 MIL/uL (ref 3.87–5.11)
RDW: 12.9 % (ref 11.5–15.5)
WBC: 6.8 10*3/uL (ref 4.0–10.5)
nRBC: 0 % (ref 0.0–0.2)

## 2021-01-16 LAB — BASIC METABOLIC PANEL
Anion gap: 6 (ref 5–15)
BUN: 15 mg/dL (ref 6–20)
CO2: 24 mmol/L (ref 22–32)
Calcium: 9.3 mg/dL (ref 8.9–10.3)
Chloride: 108 mmol/L (ref 98–111)
Creatinine, Ser: 0.98 mg/dL (ref 0.44–1.00)
GFR, Estimated: >60 mL/min (ref >60)
Glucose, Bld: 91 mg/dL (ref 70–99)
Potassium: 3.7 mmol/L (ref 3.5–5.1)
Sodium: 138 mmol/L (ref 135–145)

## 2021-01-16 LAB — PROTIME-INR
INR: 1.1 (ref 0.8–1.2)
Prothrombin Time: 13.4 seconds (ref 11.4–15.2)

## 2021-01-16 LAB — PREGNANCY, URINE: Preg Test, Ur: NEGATIVE

## 2021-01-16 IMAGING — XA IR ANGIO INTRA EXTRACRAN SEL COM CAROTID INNOMINATE BILAT MOD SE
8 of 9 series · 13 of 24 positions shown · IV contrast (IODINE)
Comparison: Arteriogram [DATE].

CLINICAL DATA: History of right cerebral hemispheric ischemic
stroke secondary to severe right internal carotid artery distal
cervical stenosis treated with stent assisted angioplasty
approximately 3 months earlier.

EXAM:
BILATERAL COMMON CAROTID AND INNOMINATE ANGIOGRAPHY
TECHNIQUE: Informed written consent was obtained from the patient after a
thorough discussion of the procedural risks, benefits and
alternatives. All questions were addressed. Maximal Sterile Barrier
Technique was utilized including caps, mask, sterile gowns, sterile
gloves, sterile drape, hand hygiene and skin antiseptic. A timeout
was performed prior to the initiation of the procedure.

[Series 1: ir (id) (id) · 2 of 6 slices shown]
[im 1/6]
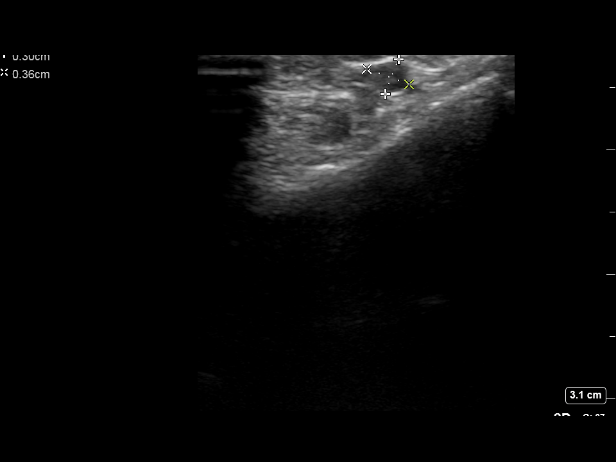
[im 6/6]
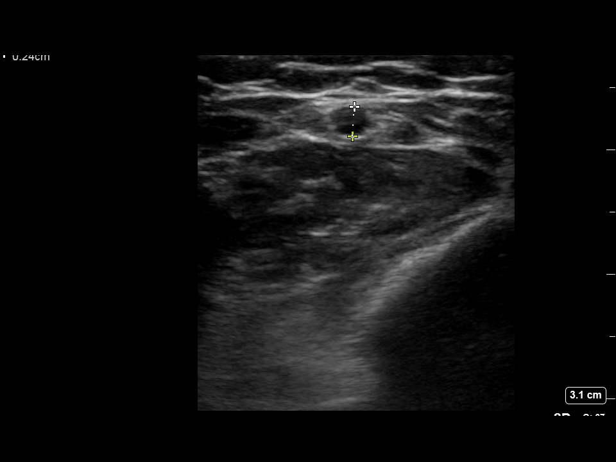

[Series 1: cerebral · 4 acquisitions, 2 frames shown (1 of 6)]
[im 2/4]
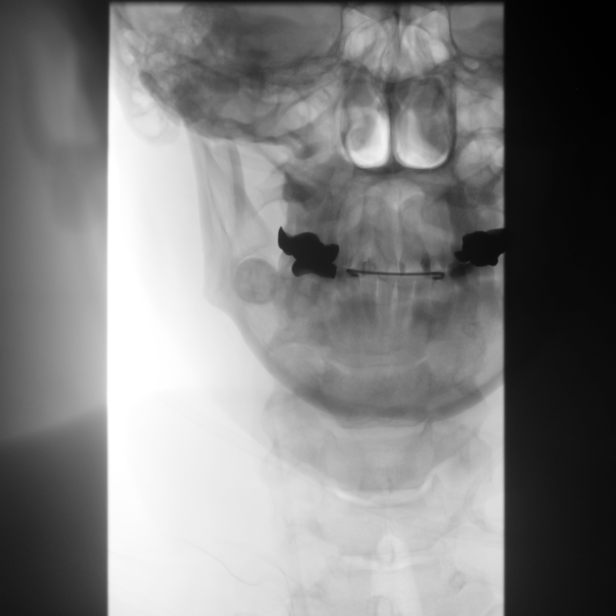
[im 4/4]
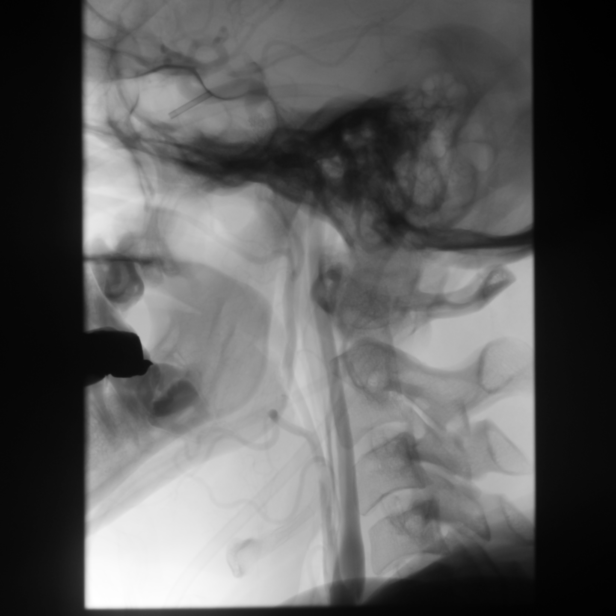

[Series 2: cerebral · 4 acquisitions, 2 frames shown (2 of 6)]
[im 2/4]
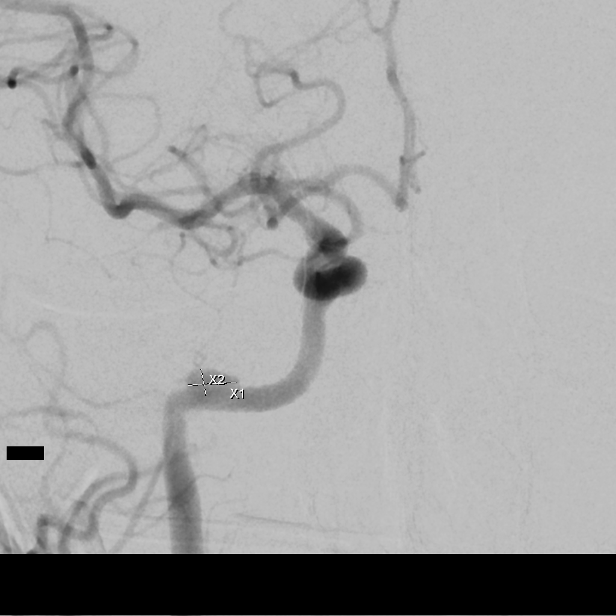
[im 4/4]
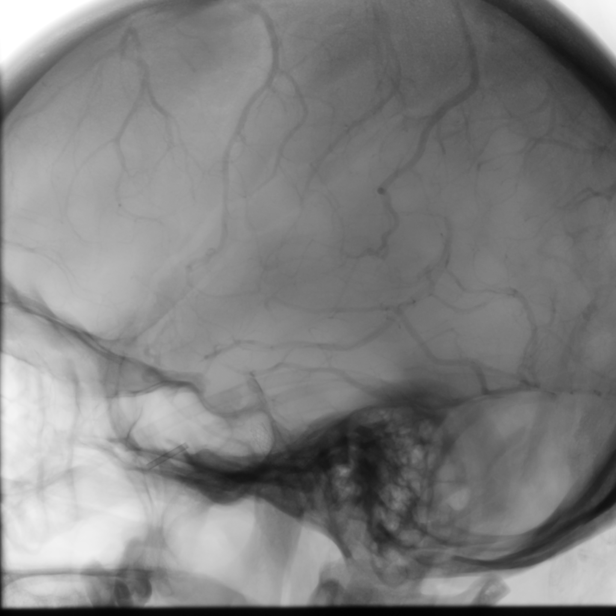

[Series 4: cerebral · 2 acquisitions, 2 frames shown (3 of 6)]
[im 1/2]
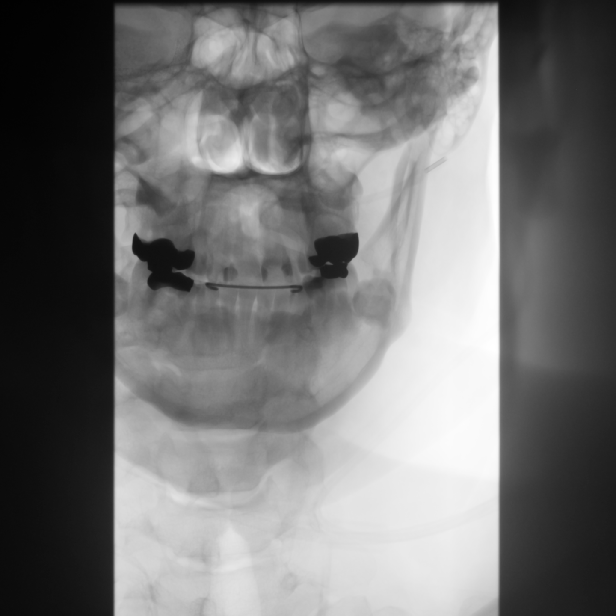
[im 1/2]
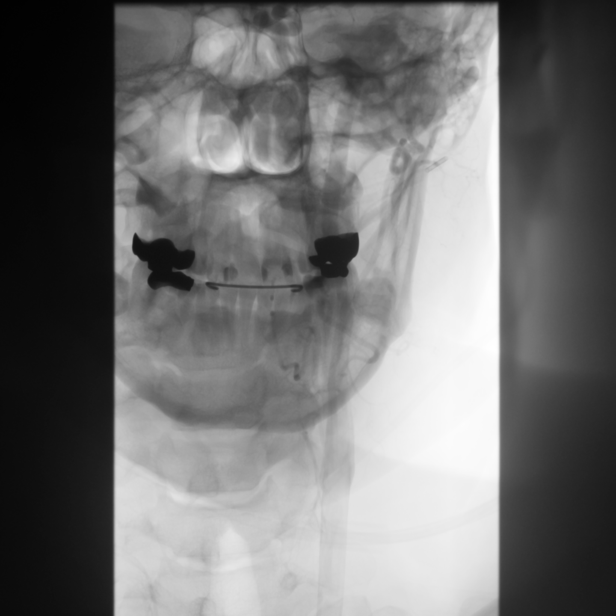

[Series 5: cerebral · 3 acquisitions, 1 frame shown (4 of 6)]
[im 3/3]
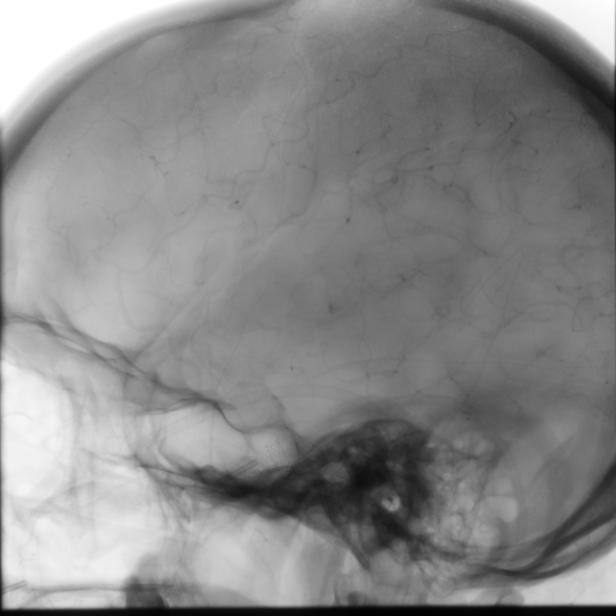

[Series 6: cerebral · 2 acquisitions, 1 frame shown (5 of 6)]
[im 1/2]
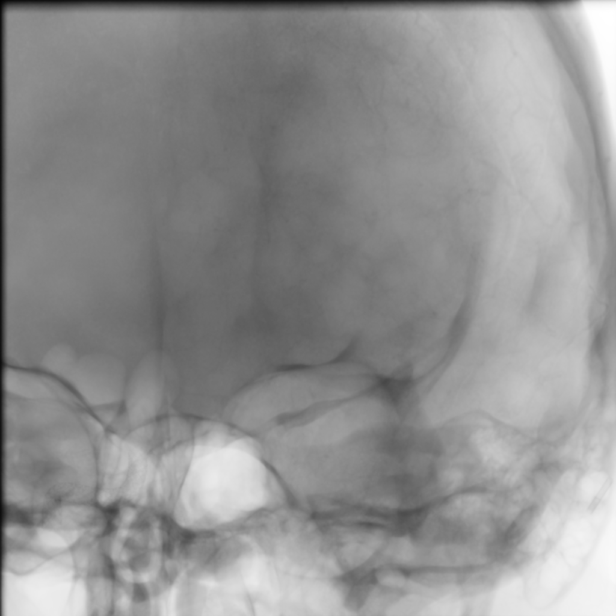

[Series 7: cerebral · 2 acquisitions, 1 frame shown (6 of 6)]
[im 1/2]
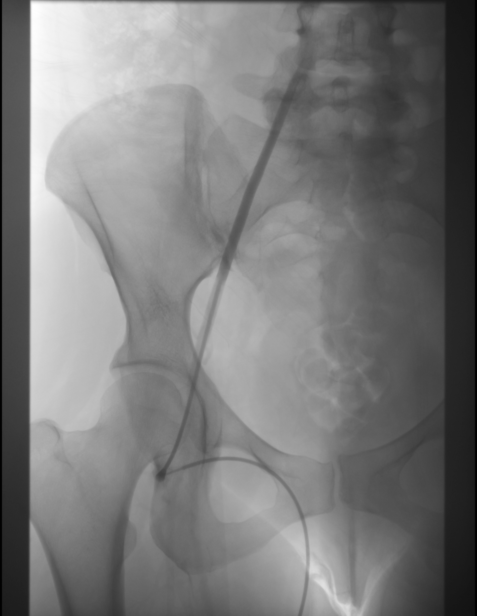

[Series 300: dr. (person_name) · 2 of 10 slices shown]
[im 4/10]
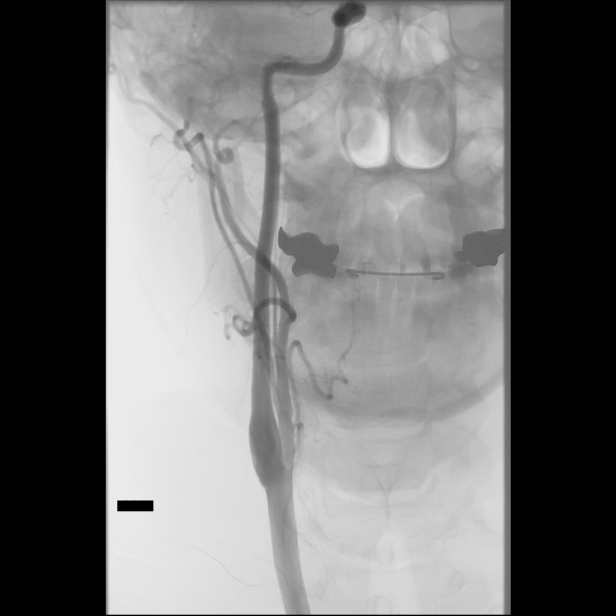
[im 10/10]
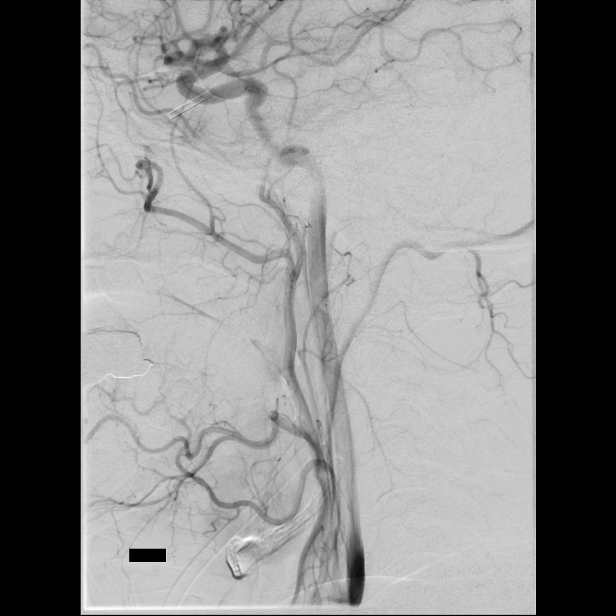

[13 of 24 positions shown; findings below may reference images not displayed]

MEDICATIONS:
Heparin [6Q] units IV. No antibiotic was administered within 1 hour
of the procedure.

ANESTHESIA/SEDATION:
Versed 1 mg IV; Fentanyl 25 mcg IV

Moderate Sedation Time:  34 minutes

The patient was continuously monitored during the procedure by the
interventional radiology nurse under my direct supervision.

CONTRAST:  Isovue 300 approximately 15 mL.

FLUOROSCOPY TIME:  Fluoroscopy Time: 3 minutes 48 seconds (405 mGy).

COMPLICATIONS:
None immediate.
The right groin was prepped and draped in the usual sterile fashion.
Thereafter using modified Seldinger technique, transfemoral access
into the right common femoral artery was obtained without
difficulty. Over a 0.035 inch guidewire, a 5 French Pinnacle sheath
was inserted. Through this, and also over 0.035 inch guidewire, a 5
JUZRYLL 2 diagnostic catheter was advanced to the aortic arch
region and selectively positioned in the right common carotid
artery, and the left common carotid artery.
FINDINGS: The right common carotid arteriogram demonstrates the right external
carotid artery and its major branches to be widely patent.

The right internal carotid artery at the bulb to the cranial skull
base is widely patent.

Patency is seen at the site of the previous stent assisted
angioplasty. There are mild FMD like changes just distal to this.

Mild fusiform dilatation is seen at the petrous cavernous junction.

Seen arising at the proximal horizontal petrous segment of the right
internal carotid artery is a saccular outpouching measuring
approximately 6 mm x 3.3 mm at the superior aspect of the vessel.
More distally the petrous, the cavernous and the supraclinoid
segments are widely patent.

The right posterior communicating artery is seen opacifying the
right posterior cerebral distribution.

The right middle cerebral artery and the right anterior cerebral
artery opacify into the capillary and venous phases.

Left common carotid arteriogram demonstrates mild stenosis at the
origin of the left external carotid artery. Its branches opacify
normally.

The left internal carotid artery at the bulb to the cranial skull
base is widely patent. The petrous, cavernous and supraclinoid
segments are widely patent.

Arising in the right posterior communicating artery region is an
approximately 3.6 mm x 2.4 mm outpouching with the left posterior
communicating artery emanating from it. The left middle cerebral
artery and the left anterior cerebral artery opacify into the
capillary and venous phases.
IMPRESSION: Wide patency of the previously treated severe stenosis at the
cervical petrous junction of right internal carotid artery with
stent assisted angioplasty.

Presence of a 6 mm x 3.3 mm outpouching at the distal portion of the
stent projecting superiorly most likely representing a pseudo
aneurysm.

A 3.6 mm x 2.4 mm outpouching in the left posterior communicating
artery region. Differential is that of an aneurysm versus an
infundibulum.

PLAN:
Findings reviewed with the patient. Patient to return for
consultation regarding the management of the angiographic findings.

## 2021-01-16 MED ORDER — MIDAZOLAM HCL 2 MG/2ML IJ SOLN
INTRAMUSCULAR | Status: AC
  Filled 2021-01-16: qty 2

## 2021-01-16 MED ORDER — FENTANYL CITRATE (PF) 100 MCG/2ML IJ SOLN
INTRAMUSCULAR | Status: AC
  Filled 2021-01-16: qty 2

## 2021-01-16 MED ORDER — NITROGLYCERIN 1 MG/10 ML FOR IR/CATH LAB
INTRA_ARTERIAL | Status: AC
  Filled 2021-01-16: qty 10

## 2021-01-16 MED ORDER — FENTANYL CITRATE (PF) 100 MCG/2ML IJ SOLN
INTRAMUSCULAR | Status: AC | PRN
  Administered 2021-01-16: 25 ug via INTRAVENOUS

## 2021-01-16 MED ORDER — SODIUM CHLORIDE 0.9 % IV SOLN: INTRAVENOUS | Status: DC

## 2021-01-16 MED ORDER — LIDOCAINE HCL 1 % IJ SOLN
INTRAMUSCULAR | Status: AC
  Filled 2021-01-16: qty 20

## 2021-01-16 MED ORDER — HEPARIN SODIUM (PORCINE) 1000 UNIT/ML IJ SOLN
INTRAMUSCULAR | Status: AC | PRN
  Administered 2021-01-16: 1000 [IU] via INTRAVENOUS

## 2021-01-16 MED ORDER — MIDAZOLAM HCL 2 MG/2ML IJ SOLN
INTRAMUSCULAR | Status: AC | PRN
  Administered 2021-01-16: 1 mg via INTRAVENOUS

## 2021-01-16 MED ORDER — HEPARIN SODIUM (PORCINE) 1000 UNIT/ML IJ SOLN
INTRAMUSCULAR | Status: AC
  Filled 2021-01-16: qty 1

## 2021-01-16 MED ORDER — VERAPAMIL HCL 2.5 MG/ML IV SOLN
INTRAVENOUS | Status: AC
  Filled 2021-01-16: qty 2

## 2021-01-16 MED ORDER — IOHEXOL 300 MG/ML  SOLN
150.0000 mL | Freq: Once | INTRAMUSCULAR | Status: AC | PRN
  Administered 2021-01-16: 50 mL via INTRA_ARTERIAL

## 2021-01-16 MED ORDER — SODIUM CHLORIDE 0.9 % IV SOLN: INTRAVENOUS | Status: AC

## 2021-01-16 NOTE — Discharge Instructions (Addendum)
Cerebral Angiogram, Care After This sheet gives you information about how to care for yourself after your procedure. Your health care provider may also give you more specific instructions. If you have problems or questions, contact your health care provider. What can I expect after the procedure? After the procedure, it is common to have:  Bruising and tenderness at the catheter insertion site.  A mild headache. Follow these instructions at home: Insertion site care  Follow instructions from your health care provider about how to take care of the insertion site. Make sure you: ? Wash your hands with soap and water before and after you change your bandage (dressing). If soap and water are not available, use hand sanitizer. ? Change your dressing as told by your health care provider.  Do not take baths, swim, or use a hot tub until your health care provider approves. You may shower 24-48 hours after the procedure, or as told by your health care provider.  To clean your insertion site: ? Gently wash the site with plain soap and water. ? Pat the area dry with a clean towel. ? Do not rub the site. This may cause bleeding.  Do not apply powder or lotion to the site. Keep the site clean and dry. Infection signs Check your incision area every day for signs of infection. Check for:  Redness, swelling, or pain.  Fluid or blood.  Warmth.  Pus or a bad smell.   Activity  Do not drive for 24 hours if you were given a sedative during your procedure.  Rest as told by your health care provider.  Do not lift anything that is heavier than 10 lb (4.5 kg), or the limit that you are told, until your health care provider says that it is safe.  Return to your normal activities as told by your health care provider, usually in about a week. Ask your health care provider what activities are safe for you. General instructions  If your insertion site starts to bleed, lie flat and put pressure on the  site. If the bleeding does not stop, get help right away. This is a medical emergency.  Do not use any products that contain nicotine or tobacco, such as cigarettes, e-cigarettes, and chewing tobacco. If you need help quitting, ask your health care provider.  Take over-the-counter and prescription medicines only as told by your health care provider.  Drink enough fluid to keep your urine pale yellow. This helps flush the contrast dye from your body.  Keep all follow-up visits as directed by your health care provider. This is important.   Contact a health care provider if:  You have a fever or chills.  You have redness, swelling, or pain around your insertion site.  You have fluid or blood coming from your insertion site.  The insertion site feels warm to the touch.  You have pus or a bad smell coming from your insertion site.  You notice blood collecting in the tissue around the insertion site (hematoma). The hematoma may be painful to the touch. Get help right away if:  You have chest pain or trouble breathing.  You have severe pain or swelling at the insertion site.  The insertion area bleeds, and bleeding continues after 30 minutes of holding steady pressure on the site.  The arm or leg where the catheter was inserted is pale, cold, numb, tingling, or weak.  You have a rash.  You have any symptoms of a stroke. "BE FAST" is  an easy way to remember the main warning signs of a stroke: ? B - Balance. Signs are dizziness, sudden trouble walking, or loss of balance. ? E - Eyes. Signs are trouble seeing or a sudden change in vision. ? F - Face. Signs are sudden weakness or numbness of the face, or the face or eyelid drooping on one side. ? A - Arms. Signs are weakness or numbness in an arm. This happens suddenly and usually on one side of the body. ? S - Speech. Signs are sudden trouble speaking, slurred speech, or trouble understanding what people say. ? T - Time. Time to call  emergency services. Write down what time symptoms started.  You have other signs of a stroke, such as: ? A sudden, severe headache with no known cause. ? Nausea or vomiting. ? Seizure. These symptoms may represent a serious problem that is an emergency. Do not wait to see if the symptoms will go away. Get medical help right away. Call your local emergency services (911 in the [place] Do not drive yourself to the hospital. Summary  Bruising and tenderness at the insertion site are common.  Follow your health care provider's instructions about caring for your insertion site. Change dressing and clean the area as instructed.  If your insertion site bleeds, apply direct pressure until bleeding stops.  Return to your normal activities as told by your health care provider. Ask what activities are safe.  Rest and drink plenty of fluids. This information is not intended to replace advice given to you by your health care provider. Make sure you discuss any questions you have with your health care provider. Document Revised: 04/25/2019 Document Reviewed: 04/25/2019 Elsevier Patient Education  2021 [employer].  Femoral Site Care  This sheet gives you information about how to care for yourself after your procedure. Your health care provider may also give you more specific instructions. If you have problems or questions, contact your health care provider. What can I expect after the procedure? After the procedure, it is common to have:  Bruising that usually fades within 1-2 weeks.  Tenderness at the site. Follow these instructions at home: Wound care  Follow instructions from your health care provider about how to take care of your insertion site. Make sure you: ? Wash your hands with soap and water before you change your bandage (dressing). If soap and water are not available, use hand sanitizer. ? Change your dressing as told by your health care provider. ? Leave stitches (sutures), skin  glue, or adhesive strips in place. These skin closures may need to stay in place for 2 weeks or longer. If adhesive strip edges start to loosen and curl up, you may trim the loose edges. Do not remove adhesive strips completely unless your health care provider tells you to do that.  Do not take baths, swim, or use a hot tub until your health care provider approves.  You may shower 24-48 hours after the procedure or as told by your health care provider. ? Gently wash the site with plain soap and water. ? Pat the area dry with a clean towel. ? Do not rub the site. This may cause bleeding.  Do not apply powder or lotion to the site. Keep the site clean and dry.  Check your femoral site every day for signs of infection. Check for: ? Redness, swelling, or pain. ? Fluid or blood. ? Warmth. ? Pus or a bad smell. Activity  For the first  2-3 days after your procedure, or as long as directed: ? Avoid climbing stairs as much as possible. ? Do not squat.  Do not lift anything that is heavier than 10 lb (4.5 kg), or the limit that you are told, until your health care provider says that it is safe.  Rest as directed. ? Avoid sitting for a long time without moving. Get up to take short walks every 1-2 hours.  Do not drive for 24 hours if you were given a medicine to help you relax (sedative). General instructions  Take over-the-counter and prescription medicines only as told by your health care provider.  Keep all follow-up visits as told by your health care provider. This is important. Contact a health care provider if you have:  A fever or chills.  You have redness, swelling, or pain around your insertion site. Get help right away if:  The catheter insertion area swells very fast.  You pass out.  You suddenly start to sweat or your skin gets clammy.  The catheter insertion area is bleeding, and the bleeding does not stop when you hold steady pressure on the area.  The area near or  just beyond the catheter insertion site becomes pale, cool, tingly, or numb. These symptoms may represent a serious problem that is an emergency. Do not wait to see if the symptoms will go away. Get medical help right away. Call your local emergency services (911 in the [place] Do not drive yourself to the hospital. Summary  After the procedure, it is common to have bruising that usually fades within 1-2 weeks.  Check your femoral site every day for signs of infection.  Do not lift anything that is heavier than 10 lb (4.5 kg), or the limit that you are told, until your health care provider says that it is safe. This information is not intended to replace advice given to you by your health care provider. Make sure you discuss any questions you have with your health care provider. Document Revised: 06/07/2020 Document Reviewed: 06/07/2020 Elsevier Patient Education  [place].

## 2021-01-16 NOTE — Sedation Documentation (Signed)
Right groin sheath removed, 54fr exoseal closure device used at site.

## 2021-01-16 NOTE — Procedures (Signed)
S/P bilateral common carotid arteriograms. RT CFA approach. Findings. 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 S.Irmgard Rampersaud MD

## 2021-01-16 NOTE — Progress Notes (Signed)
Kasey PA in to see client and ok to d/c home

## 2021-01-16 NOTE — H&P (Signed)
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 She presented at the time with new onset rt gaze deviation and LUE/LLE weakness, rt MCA/CVA. PMH sig for migraines, remote tobacco use,  nephrolithiasis, anemia, prior COVID 19 in 09/2020.She presents again today for f/u cerebral arteriogram to assess stability. She remains on brilinta and aspirin. She denies fever,HA, CP,dyspnea, cough ,abd pain, back pain,N/V or any new neuro changes. She only has minimal weakness of her left arm. She is on her period currently.   Past Medical History:  Diagnosis Date  . Anemia   . History of cesarean delivery affecting pregnancy 08/10/2017  . Kidney stones    surgically removed at 40YO and 40YO  . Migraines    associated with menstrual cycle  . UTI (urinary tract infection)   . Vaginal Pap smear, abnormal    Past Surgical History:  Procedure Laterality Date  . BUBBLE STUDY  11/18/2020   Procedure: BUBBLE STUDY;  Surgeon: Elouise Munroe, MD;  Location: [facility];  Service: Cardiology;;  . CESAREAN SECTION N/A 07/21/2014   Procedure: CESAREAN SECTION;  Surgeon: Elveria Royals, MD;  Location: [facility];  Service: Obstetrics;  Laterality: N/A;  . CESAREAN SECTION N/A 08/10/2017   Procedure: Repeat CESAREAN SECTION;  Surgeon: Azucena Fallen, MD;  Location: [facility];  Service: Obstetrics;  Laterality: N/A;  EDD: 08/17/17 Allergy: Sulfa  . IR CT HEAD LTD   11/13/2020  . IR INTRA CRAN STENT  11/13/2020  . IR PERCUTANEOUS ART THROMBECTOMY/INFUSION INTRACRANIAL INC DIAG ANGIO  11/13/2020  . KIDNEY STONE SURGERY    . RADIOLOGY WITH ANESTHESIA N/A 11/13/2020   Procedure: IR WITH ANESTHESIA;  Surgeon: Radiologist, Medication, MD;  Location: [facility];  Service: Radiology;  Laterality: N/A;  . TEE WITHOUT CARDIOVERSION N/A 11/18/2020   Procedure: TRANSESOPHAGEAL ECHOCARDIOGRAM (TEE);  Surgeon: Elouise Munroe, MD;  Location: [facility];  Service: Cardiology;  Laterality: N/A;  . TONSILLECTOMY AND ADENOIDECTOMY  1991      Allergies: Sulfa antibiotics  Medications: Prior to Admission medications   Medication Sig Start Date End Date Taking? Authorizing Provider  acetaminophen (TYLENOL) 500 MG tablet Take 1,000 mg by mouth every 6 (six) hours as needed for moderate pain.   Yes [provider]  aspirin 81 MG chewable tablet Chew 1 tablet (81 mg total) by mouth daily. 11/21/20  Yes Angiulli, Lavon Paganini, PA-C  atorvastatin (LIPITOR) 40 MG tablet Take 1 tablet (40 mg total) by mouth daily. 12/23/20  Yes Copland, Gay Filler, MD  BRILINTA 90 MG TABS tablet TAKE 1 TABLET BY MOUTH 2 TIMES DAILY. Patient taking differently: Take 90 mg by mouth 2 (two) times daily. 12/30/20  Yes Bayard Hugger, NP  FLUoxetine (PROZAC) 20 MG tablet Take 1 tablet (20 mg total) by mouth daily. Increase to 40 mg after 2 weeks Patient taking differently: Take 20 mg by mouth daily. 11/26/20  Yes Copland, Gay Filler, MD  Multiple Vitamins-Minerals (MULTIVITAMIN WITH MINERALS) tablet Take 1 tablet by mouth daily.   Yes [provider]  pantoprazole (PROTONIX) 40  MG tablet Take 1 tablet (40 mg total) by mouth daily. Patient taking differently: Take 40 mg by mouth daily as needed (acid reflux). 12/23/20  Yes Copland, Gay Filler, MD  polyethylene glycol (MIRALAX / GLYCOLAX) 17 g packet Take 17 g by mouth 2 (two) times daily. Patient taking differently: Take 17 g by mouth daily as  needed for mild constipation. 11/21/20  Yes Angiulli, Lavon Paganini, PA-C     Vital Signs: BP 117/73   Pulse 81   Temp 98.3 F (36.8 C) (Oral)   Resp 16   Ht 5\' 4"  (1.626 m)   Wt 172 lb (78 kg)   LMP 01/16/2021   SpO2 100%   BMI 29.52 kg/m   Physical Exam awake/alert; face symm, tongue midline, EOMI, no visual difficulties, finger to nose,FMM nl; speech nl; strength/sens all fours nl with only minimal left arm weakness noted; no LE edema; chest- CTA bilat; heart- RRR; abd- soft,+BS,NT  Imaging: No results found.  Labs:  CBC: Recent Labs    11/17/20 0112 11/18/20 0154 11/19/20 0500 01/16/21 0700  WBC 6.2 6.5 6.4 6.8  HGB 11.4* 11.3* 11.8* 12.8  HCT 31.6* 31.7* 34.9* 38.3  PLT 222 216 229 249    COAGS: Recent Labs    11/13/20 1023 01/16/21 0700  INR 1.1 1.1  APTT 28  --     BMP: Recent Labs    11/16/20 0151 11/17/20 0112 11/18/20 0154 11/19/20 0500  NA 136 137 136 138  K 3.4* 3.4* 3.3* 3.6  CL 108 106 106 106  CO2 20* 20* 20* 21*  GLUCOSE 89 101* 95 94  BUN 10 9 10 13   CALCIUM 8.2* 8.7* 9.0 9.1  CREATININE 0.81 0.82 0.81 0.86  GFRNONAA >60 >60 >60 >60    LIVER FUNCTION TESTS: Recent Labs    03/21/20 1050 11/13/20 1023 11/19/20 0500  BILITOT 0.5 1.3* 0.9  AST 17 27 28   ALT 18 37 34  ALKPHOS 32* 33* 30*  PROT 6.7 7.4 6.3*  ALBUMIN 4.5 4.3 3.5    Assessment and Plan: Pt familiar to [facility] service from endovascular complete revascularization of 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 She presented at the time with new onset rt gaze deviation and LUE/LLE weakness, rt MCA/CVA. PMH sig for migraines, remote tobacco use,  nephrolithiasis, anemia, prior COVID 19 in  09/2020.She presents again today for f/u cerebral arteriogram to assess stability. She remains on brilinta and aspirin. Risks and benefits of procedure were discussed with the patient including, but not limited to bleeding, infection, vascular injury or contrast induced renal failure.  This interventional procedure involves the use of X-rays and because of the nature of the planned procedure, it is possible that we will have prolonged use of X-ray fluoroscopy.  Potential radiation risks to you include (but are not limited to) the following: - A slightly elevated risk for cancer  several years later in life. This risk is typically less than 0.5% percent. This risk is low in comparison to the normal incidence of human cancer, which is 33% for women and 50% for men according to the [place]. - Radiation induced injury can include skin redness, resembling a rash, tissue breakdown / ulcers and hair loss (which can be temporary or permanent).  The likelihood of either of these occurring depends on the difficulty of the procedure and whether you are sensitive to radiation due to previous procedures, disease, or genetic conditions.   IF your procedure requires a prolonged use of radiation, you will be notified and given written instructions for further action.  It is your responsibility to monitor the irradiated area for the 2 weeks following the procedure and to notify your physician if you are concerned that you have suffered a radiation induced injury.    All of the patient's questions were answered, patient is agreeable to proceed.  Consent signed and in chart.      Electronically Signed: D. Rowe Robert, PA-C 01/16/2021, 8:02 AM   I spent a total of 25 minutes at the the patient's bedside AND on the patient's hospital floor or unit, greater than 50% of which was counseling/coordinating care for cerebral arteriogram

## 2021-01-16 NOTE — Progress Notes (Signed)
Client c/o feeling heaviness in back of head after getting up to bathroom; no c/o dizziness; Kasey,PA notified and will be in to see client

## 2021-01-17 ENCOUNTER — Other Ambulatory Visit (HOSPITAL_COMMUNITY): Payer: Self-pay | Admitting: Interventional Radiology

## 2021-01-17 DIAGNOSIS — I639 Cerebral infarction, unspecified: Secondary | ICD-10-CM

## 2021-01-20 ENCOUNTER — Other Ambulatory Visit: Payer: Self-pay

## 2021-01-20 ENCOUNTER — Ambulatory Visit (HOSPITAL_COMMUNITY)
Admission: RE | Admit: 2021-01-20 | Discharge: 2021-01-20 | Disposition: A | Discharge status: Home / Self Care | Payer: BC Managed Care – PPO | Source: Ambulatory Visit | Attending: Interventional Radiology | Admitting: Interventional Radiology

## 2021-01-20 DIAGNOSIS — I639 Cerebral infarction, unspecified: Secondary | ICD-10-CM

## 2021-01-20 DIAGNOSIS — I671 Cerebral aneurysm, nonruptured: Secondary | ICD-10-CM | POA: Diagnosis not present

## 2021-01-21 ENCOUNTER — Ambulatory Visit: Payer: BC Managed Care – PPO | Attending: Physician Assistant | Admitting: Occupational Therapy

## 2021-01-21 ENCOUNTER — Other Ambulatory Visit: Payer: Self-pay

## 2021-01-21 DIAGNOSIS — R278 Other lack of coordination: Secondary | ICD-10-CM | POA: Diagnosis not present

## 2021-01-21 DIAGNOSIS — M6281 Muscle weakness (generalized): Secondary | ICD-10-CM | POA: Diagnosis not present

## 2021-01-21 HISTORY — PX: IR RADIOLOGIST EVAL & MGMT: IMG5224

## 2021-01-21 NOTE — Therapy (Signed)
[place] MAIN [facility] SERVICES [address] [place], [place], [address] Phone: [phone]   Fax:  [phone]  Occupational Therapy Evaluation  Patient Details  Name: Dana Henry MRN: [id number] Date of Birth: [birth year] No data recorded  Encounter Date: 01/21/2021   OT End of Session - 01/21/21 1548    Visit Number 1    Number of Visits 24    Date for OT Re-Evaluation 04/15/21    Authorization Type Progress report period starting 01/21/2021    OT Start Time 1100    OT Stop Time 1200    OT Time Calculation (min) 60 min    Activity Tolerance Patient tolerated treatment well    Behavior During Therapy [facility] for tasks assessed/performed           Past Medical History:  Diagnosis Date  . Anemia   . History of cesarean delivery affecting pregnancy 08/10/2017  . Kidney stones    surgically removed at 40YO and 40YO  . Migraines    associated with menstrual cycle  . UTI (urinary tract infection)   . Vaginal Pap smear, abnormal     Past Surgical History:  Procedure Laterality Date  . BUBBLE STUDY  11/18/2020   Procedure: BUBBLE STUDY;  Surgeon: Elouise Munroe, MD;  Location: [place];  Service: Cardiology;;  . CESAREAN SECTION N/A 07/21/2014   Procedure: CESAREAN SECTION;  Surgeon: Elveria Royals, MD;  Location: [facility];  Service: Obstetrics;  Laterality: N/A;  . CESAREAN SECTION N/A 08/10/2017   Procedure: Repeat CESAREAN SECTION;  Surgeon: Azucena Fallen, MD;  Location: [place];  Service: Obstetrics;  Laterality: N/A;  EDD: 08/17/17 Allergy: Sulfa  . IR ANGIO INTRA EXTRACRAN SEL COM CAROTID INNOMINATE BILAT MOD SED  01/16/2021  . IR CT HEAD LTD  11/13/2020  . IR INTRA CRAN STENT  11/13/2020  . IR PERCUTANEOUS ART THROMBECTOMY/INFUSION INTRACRANIAL INC DIAG ANGIO  11/13/2020  . IR RADIOLOGIST EVAL & MGMT  01/21/2021  . KIDNEY STONE SURGERY    . RADIOLOGY WITH ANESTHESIA N/A 11/13/2020   Procedure: IR WITH ANESTHESIA;   Surgeon: Radiologist, Medication, MD;  Location: [place];  Service: Radiology;  Laterality: N/A;  . TEE WITHOUT CARDIOVERSION N/A 11/18/2020   Procedure: TRANSESOPHAGEAL ECHOCARDIOGRAM (TEE);  Surgeon: Elouise Munroe, MD;  Location: [place];  Service: Cardiology;  Laterality: N/A;  . TONSILLECTOMY AND ADENOIDECTOMY  1991    There were no vitals filed for this visit.   Subjective Assessment - 01/21/21 1538    Subjective  Pt. reports having her follow-up appointment with the neurologist tomorrow.    Pertinent History Pt. is a 40 y.o. 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 Pt. is s/p IR with mechanical thrombectomy, and TICI 3 revascularization. Pt. is s/p rescue angioplasty, and stenting for Internal Carotid Petrous seg stenosis. Pt. PMHx: [id number] 10/21/2019.  Pt. 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 Pt. has recently moved from [place] to [place] on the day of the onset of the CVA.    Limitations Left sided weakness, and FMC    Currently in Pain? No/denies             [facility] OT Assessment - 01/21/21 0001      Assessment   Medical Diagnosis CVA    Onset Date/Surgical Date 11/13/20    Hand Dominance Right    Next MD  Visit 01/22/2021    Prior Therapy Inpt. Rehab      Precautions   Precautions None      Restrictions   Weight Bearing Restrictions No      Balance Screen   Has the patient fallen in the past 6 months No    Has the patient had a decrease in activity level because of a fear of falling?  No    Is the patient reluctant to leave their home because of a fear of falling?  No      Home  Environment   Family/patient expects to be discharged to: Private residence    Living Arrangements Spouse/significant other    Available Help at Discharge Family    Type of [place] Two level    Alternate Level Stairs - Number of  Steps 1    Bathroom Building control surveyor;Door    Science writer held shower head    Lives With Spouse;Family      Prior Function   Level of [place] Independent    Vocation Full time employment    Mining engineer, exercise, play with girls, color, do crafts.      ADL   Eating/Feeding Independent    Grooming Independent   Drops caps with the left hand   Upper Body Bathing Independent    Lower Body Bathing Independent    Upper Body Dressing Independent   Buttons   Lower Body Dressing Independent    Toilet Transfer Independent    Toileting - Social research officer, government -  Product/process development scientist Independent      IADL   Prior Level of Function Shopping Independent    Shopping Needs to be accompanied on any shopping trip    Prior Level of Function Light Housekeeping Independent    Light Housekeeping Launders small items, rinses stockings, etc.;Performs light daily tasks such as dishwashing, bed making    Prior Level of Function Meal Prep Independent    Meal Prep Able to complete simple warm meal prep    Prior Level of Function Scientist, research (physical sciences) Relies on family or friends for transportation    Prior Level of Function Medication Managment Independent    Medication Management Is responsible for taking medication in correct dosages at correct time    Prior Level of Function Therapist, sports financial matters independently (budgets, writes checks, pays rent, bills goes to bank), collects and keeps track of income      Mobility   Mobility Status Independent      Written Expression   Dominant Hand Right    Handwriting --   No changes     Vision - History   Baseline Vision Wears glasses only for reading    Additional Comments Photosensitivit      Cognition   Overall  Cognitive Status Within Functional Limits for tasks assessed      Sensation   Light Touch Appears Intact    Proprioception Appears Intact      Coordination   Gross Motor Movements are Fluid and Coordinated Yes    Fine Motor Movements are Fluid and Coordinated No    Right 9 Hole Peg Test 21    Left 9 Hole Peg Test 39  Strength   Overall Strength Comments LUE shoulder flexion, abduction 4+/5, elbow flexion extension 5/5, wrist flexion, and extension 5/5.  RUE WNL      Hand Function   Right Hand Grip (lbs) 62    Right Hand Lateral Pinch 12 lbs    Right Hand 3 Point Pinch 13 lbs    Left Hand Grip (lbs) 36    Left Hand Lateral Pinch 12 lbs    Left 3 point pinch 14 lbs          There. Ex:  Pt. Education was provided hand strengthening for theraputty. Pt. currently has yellow, and pink putty at home. Exercises included: gross gripping, gross digit extension, thumb abduction, lateral, and 3pt. Pinch strengthening, digit abduction, and thumb opposition. Pt. Was provided with a visual handout HEP through [place]. Pt. May be appropriate to upgrade to green resistive theraputty soon.                 OT Education - 01/21/21 1548    Education Details OT services POC, Goals    Person(s) Educated Patient    Methods Explanation;Demonstration    Comprehension Verbalized understanding;Returned demonstration;Verbal cues required               OT Long Term Goals - 01/21/21 1605      OT LONG TERM GOAL #1   Title Pt. will improve left grip strength to be able to opens containers, and jars.    Baseline Eval: Pt. has difficulty    Time 12    Period Weeks    Status New    Target Date 04/15/21      OT LONG TERM GOAL #2   Title Pt. will independently, and efficiently perform typing skills in preparation for work related tasks.    Baseline Eval: Pt. has difficulty completing work related typing tasks.    Time 12    Period Weeks    Status New    Target Date 04/15/21       OT LONG TERM GOAL #3   Title Pt. will improve left hand [facility] skills in order to be able to independently hold a makeup applicator without dropping it.    Baseline Eval: pt. frequently drops makeup applicators with her left hand    Time 12    Period Weeks    Status New    Target Date 04/15/21      OT LONG TERM GOAL #4   Title Pt. will independently fasten small buttons efficiently on children's clothing.    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    Time 12    Period Weeks    Status New    Target Date 04/15/21      OT LONG TERM GOAL #5   Title Pt. will independently, and efficiently stir with her left hand while baking.    Baseline Eval: Pt. has difficulty with LUE motor control while stirring.    Time 12    Period Weeks    Status New    Target Date 04/15/21      Long Term Additional Goals   Additional Long Term Goals Yes      OT LONG TERM GOAL #6   Title Pt. will improve LUE strength to be able to place items on higher shelves.    Baseline Eval: Pt. has difficulty placing weighted items onto higher shelves.    Time 12    Period Weeks    Status New  Target Date 04/15/21                 Plan - 01/21/21 1550    Clinical Impression Statement Pt. is a 41. y.o. female who was admitted to [facility] with a CVA. Pt. underwent a thrombectomy, and stent placement at the internal carotid artery. Pt. plans to schedule to have a procedure for an aneurysm repair within the next couple of weeks. Pt. presents with decreased LUE strength, grip strength, and impaired [facility] skills which make it difficult to hold, and use small objects during ADLs, and IADL tasks. Pt. will benefit from OT serivces to work on improving LUE strength, and [facility] skills in order to be able to handle make-up applicators without dropping them, fasten small buttons on children's clothing, twisting caps, and improve typing for work retaed tasks.    OT Occupational Profile  and History Detailed Assessment- Review of Records and additional review of physical, cognitive, psychosocial history related to current functional performance    Occupational performance deficits (Please refer to evaluation for details): ADL's;IADL's;Leisure    Body Structure / Function / Physical Skills ADL;IADL;Strength;ROM;Coordination    Rehab Potential Excellent    Clinical Decision Making Several treatment options, min-mod task modification necessary    Comorbidities Affecting Occupational Performance: May have comorbidities impacting occupational performance    Modification or Assistance to Complete Evaluation  Min-Moderate modification of tasks or assist with assess necessary to complete eval    OT Frequency 2x / week    OT Duration 12 weeks    OT Treatment/Interventions Self-care/ADL training;DME and/or AE instruction;Therapeutic exercise;Neuromuscular education;Patient/family education;Therapeutic activities    Consulted and Agree with Plan of Care Patient           Patient will benefit from skilled therapeutic intervention in order to improve the following deficits and impairments:   Body Structure / Function / Physical Skills: ADL,IADL,Strength,ROM,Coordination       Visit Diagnosis: Muscle weakness (generalized)  Other lack of coordination    Problem List Patient Active Problem List   Diagnosis Date Noted  . Chronic tension-type headache, not intractable 01/09/2021  . Class 1 obesity due to excess calories with serious comorbidity and body mass index (BMI) of 30.0 to 30.9 in adult   . Slow transit constipation   . Hypoalbuminemia due to protein-calorie malnutrition ([place])   . Marijuana abuse   . Left hemiparesis ([place])   . 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 Dysphagia, post-stroke   . Acute blood loss anemia   . Stroke (cerebrum) ([place]) 11/13/2020  . Internal carotid artery stenosis, right 11/13/2020  . Migraines 10/06/2012    Harrel Carina, MS, OTR/L 01/21/2021, 4:15 PM  [place] MAIN [facility] SERVICES [address] [place], [place], [address] Phone: [phone]   Fax:  [phone]  Name: Dana Henry MRN: [id number] Date of Birth: 1981-07-10

## 2021-01-22 ENCOUNTER — Ambulatory Visit (INDEPENDENT_AMBULATORY_CARE_PROVIDER_SITE_OTHER): Payer: BC Managed Care – PPO | Admitting: Neurology

## 2021-01-22 ENCOUNTER — Encounter: Payer: Self-pay | Admitting: Neurology

## 2021-01-22 VITALS — BP 120/68 | HR 66 | Ht 64.0 in | Wt 172.0 lb

## 2021-01-22 DIAGNOSIS — I720 Aneurysm of carotid artery: Secondary | ICD-10-CM | POA: Diagnosis not present

## 2021-01-22 DIAGNOSIS — I6521 Occlusion and stenosis of right carotid artery: Secondary | ICD-10-CM | POA: Diagnosis not present

## 2021-01-22 DIAGNOSIS — R29898 Other symptoms and signs involving the musculoskeletal system: Secondary | ICD-10-CM

## 2021-01-22 NOTE — Progress Notes (Signed)
[facility] [address] [place]. [place] [address] [phone]       OFFICE FOLLOW-UP NOTE  [name] Date of Birth:  [birth year] Medical Record Number:  [id number]   HPI: Dana Henry is a pleasant 40 year old Caucasian lady seen today for initial office follow-up visit following hospital admission for stroke in January 2022.  History is obtained from the patient, review of electronic medical records and I personally reviewed pertinent imaging imaging films in PACS.  She has past medical history of migraines, Covid infection in December 2021 who presented as a code stroke on 11/13/2020.  She complained of left-sided numbness and weakness while at [facility] along with heaviness.  She was able to walk and husband was sick so she did not want to bother him.  Next day her symptoms got worse with gaze deviation and left facial droop and left-sided weakness and EMS was called.  CT scan of the head showed right caudate and basal ganglia subacute infarcts and CT angiogram of the head and neck showed right petrous segment occlusion versus high-grade stenosis.  CT perfusion showed a large penumbra on the right without any core infarct.  Patient underwent successful mechanical thrombectomy of the occluded right middle cerebral artery with  TICI 3 revascularization by Dr. Estanislado Pandy which also needed rescue right angioplasty and stenting.Marland Kitchen  She was admitted to the intensive care unit and blood pressure was tightly controlled.  She was extubated and did well.  Subsequent MRI scan of the brain showed patchy multifocal acute right MCA territory infarcts involving subcortical and deep white matter of the right frontal and parietal lobes mostly watershed distribution.  MRA showed patent right petrous ICA with a stent with preserved distal flow.  2D echo showed ejection fraction of 60 to 65%.  Lower extremity venous Dopplers were negative for DVT.  Transcranial Doppler bubble study was  positive for a small Spencer grade 2 right-to-left shunt however TEE showed a redundant atrial septum with possible color-flow but no right-to-left shunt.  LDL cholesterol is 96 mg percent.  Hemoglobin A1c was 5.1.  Urine drug screen was positive for cannabis.  Hypercoagulable panel as well as autoimmune labs were all negative.  Patient was started on aspirin and Brilinta.  She states she is done well.  She has only mild weakness in her hand with diminished fine motor skills but is able to walk well and speech is back to normal.  She is currently doing outpatient occupational therapy only and has finished physical therapy.  She is tolerating aspirin and Brilinta well with only minor bruising and no bleeding.  Blood pressures well controlled and today it is 120/68.  She was started on Prozac for anxiety by primary physician and that seems to be helping.  She had a follow-up angiogram done by Dr. 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  There is also a small 3.6 x 2.4 mm outpouching of the left posterior communicating artery possibly an infundibulum versus small aneurysm.  Patient is scheduled for elective pseudoaneurysm coiling and treatment by Dr. Estanislado Pandy soon.  She has no new complaints.  She is tolerating Lipitor well without muscle aches and pains.  She has not had any follow-up lipid profile checked.  ROS:   14 system review of systems is positive for hand weakness, diminished fine motor skills, bruising, anxiety and all other systems  negative  PMH:  Past Medical History:  Diagnosis Date  . Anemia   . CVA (cerebral vascular accident) ([place])   . History of cesarean delivery affecting pregnancy 08/10/2017  . Kidney stones    surgically removed at 40YO and 40YO  . Migraines    associated with menstrual cycle  . UTI (urinary tract infection)   . Vaginal Pap smear, abnormal      Social History:  Social History   Socioeconomic History  . Marital status: Married    Spouse name: Dana Henry  . Number of children: 2  . Years of education: College  . Highest education level: Not on file  Occupational History  . Occupation: Press photographer  Tobacco Use  . Smoking status: Former Smoker    Packs/day: 0.50    Types: Cigarettes    Quit date: 10/19/2013    Years since quitting: 7.2  . Smokeless tobacco: Never Used  Vaping Use  . Vaping Use: Never used  Substance and Sexual Activity  . Alcohol use: Yes    Comment: occassional  . Drug use: No  . Sexual activity: Yes    Birth control/protection: None  Other Topics Concern  . Not on file  Social History Narrative   Lives in [place] with husband and mother and father and mother in Sports coach. Daughter 27months, Huel Coventry.      Right-handed.      Work - Manufacturing systems engineer in [employer]      Diet - regular   Exercise - occasional walks      Caffinated Beverages: Yes - 2 cups per day   Herbal Remedies: No   Seat Belts: Yes   Bike Helmet: Yes   Exercise 3 Times a Week: No   Vegetarian: No   Eat Dairy Products:   Take Vitamins: Yes   Use Hearing Aid: No   Wear Dentures: No   Smoke Alarms in Home: Yes   Guns/Firearms: No   Physical Abuse: No      Hours of Sleep: 6-7   # of People in Home: 3               Social Determinants of Health   Financial Resource Strain: Not on file  Food Insecurity: Not on file  Transportation Needs: Not on file  Physical Activity: Not on file  Stress: Not on file  Social Connections: Not on file  Intimate Partner Violence: Not on file    Medications:   Current Outpatient Medications on File Prior to Visit  Medication Sig Dispense Refill  . acetaminophen (TYLENOL) 500 MG tablet Take 1,000 mg by mouth every 6 (six) hours as needed for moderate pain.    Marland Kitchen aspirin 81 MG chewable tablet Chew 1 tablet (81 mg total) by mouth daily.    Marland Kitchen atorvastatin (LIPITOR) 40 MG tablet Take 1  tablet (40 mg total) by mouth daily. 90 tablet 3  . BRILINTA 90 MG TABS tablet TAKE 1 TABLET BY MOUTH 2 TIMES DAILY. (Patient taking differently: Take 90 mg by mouth 2 (two) times daily.) 60 tablet 0  . FLUoxetine (PROZAC) 20 MG tablet Take 1 tablet (20 mg total) by mouth daily. Increase to 40 mg after 2 weeks (Patient taking differently: Take 20 mg by mouth daily.) 60 tablet 5  . Multiple Vitamins-Minerals (MULTIVITAMIN WITH MINERALS) tablet Take 1 tablet by mouth daily.    . pantoprazole (PROTONIX) 40 MG tablet Take 1 tablet (40 mg total) by mouth daily. 90 tablet 3  . polyethylene  glycol (MIRALAX / GLYCOLAX) 17 g packet Take 17 g by mouth 2 (two) times daily. 14 each 0   No current facility-administered medications on file prior to visit.    Allergies:   Allergies  Allergen Reactions  . Sulfa Antibiotics Hives and Swelling    Physical Exam General: well developed, well nourished, seated, in no evident distress Head: head normocephalic and atraumatic.  Neck: supple with no carotid or supraclavicular bruits Cardiovascular: regular rate and rhythm, no murmurs Musculoskeletal: no deformity Skin:  no rash/petichiae Vascular:  Normal pulses all extremities Vitals:   01/22/21 1003  BP: 120/68  Pulse: 66   Neurologic Exam Mental Status: Awake and fully alert. Oriented to place and time. Recent and remote memory intact. Attention span, concentration and fund of knowledge appropriate. Mood and affect appropriate.  Cranial Nerves: Fundoscopic exam reveals sharp disc margins. Pupils equal, briskly reactive to light. Extraocular movements full without nystagmus. Visual fields full to confrontation. Hearing intact. Facial sensation intact. Face, tongue, palate moves normally and symmetrically.  Motor: Normal bulk and tone. Normal strength in all tested extremity muscles.  Diminished fine finger movements on the left.  Orbits right over left upper extremity.  Mild left grip weakness. Sensory.:  intact to touch ,pinprick .position and vibratory sensation.  Coordination: Rapid alternating movements normal in all extremities. Finger-to-nose and heel-to-shin performed accurately bilaterally. Gait and Station: Arises from chair without difficulty. Stance is normal. Gait demonstrates normal stride length and balance . Able to heel, toe and tandem walk without difficulty.  Reflexes: 1+ and symmetric. Toes downgoing.   NIHSS  0 Modified Rankin  2   ASSESSMENT: 40 year old Caucasian lady with right MCA infarct due to terminal carotid occlusion likely from underlying intrinsic atherosclerosis treated with successful mechanical thrombectomy followed by rescue angioplasty stenting.  No significant vascular risk factors except mild hyper lipidemia.  Small PFO noted on TCD bubble study but not visualized on TEE.     PLAN: I had a long d/w patient about her  recent stroke, carotid stenosis and stenting and pseudoaneurysm,risk for recurrent stroke/TIAs, personally independently reviewed imaging studies and stroke evaluation results and answered questions.Continue aspirin 81 mg daily and Brilinta (ticagrelor) 90 mg bid  for secondary stroke prevention for 6 months and then aspirin alone and maintain strict control of hypertension with blood pressure goal below 130/90, diabetes with hemoglobin A1c goal below 6.5% and lipids with LDL cholesterol goal below 70 mg/dL. I also advised the patient to eat a healthy diet with plenty of whole grains, cereals, fruits and vegetables, exercise regularly and maintain ideal body weight.  Check follow-up lipid profile.  Follow-up with Dr. Estanislado Pandy for right carotid pseudoaneurysm treatment.  Patient was cleared to drive and she may return back to work as well.  Followup in the future with my nurse practitioner Janett Billow in 6 months or call earlier if necessary. Greater than 50% of time during this 25 minute visit was spent on counseling,explanation of diagnosis, planning of  further management, discussion with patient and family and coordination of care Antony Contras, MD Note: This document was prepared with digital dictation and possible smart phrase technology. Any transcriptional errors that result from this process are unintentional

## 2021-01-22 NOTE — Patient Instructions (Signed)
I had a long d/w patient about her  recent stroke, carotid stenosis and stenting and pseudoaneurysm,risk for recurrent stroke/TIAs, personally independently reviewed imaging studies and stroke evaluation results and answered questions.Continue aspirin 81 mg daily and Brilinta (ticagrelor) 90 mg bid  for secondary stroke prevention for 6 months and then aspirin alone and maintain strict control of hypertension with blood pressure goal below 130/90, diabetes with hemoglobin A1c goal below 6.5% and lipids with LDL cholesterol goal below 70 mg/dL. I also advised the patient to eat a healthy diet with plenty of whole grains, cereals, fruits and vegetables, exercise regularly and maintain ideal body weight.  Check follow-up lipid profile.  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  Patient was cleared to drive and Dana Henry may return back to work as well.  Followup in the future with my nurse practitioner Dana Henry in 6 months or call earlier if necessary.   Stroke Prevention Some medical conditions and behaviors are associated with a higher chance of having a stroke. You can help prevent a stroke by making nutrition, lifestyle, and other changes, including managing any medical conditions you may have. What nutrition changes can be made?  Eat healthy foods. You can do this by: ? Choosing foods high in fiber, such as fresh fruits and vegetables and whole grains. ? Eating at least 5 or more servings of fruits and vegetables a day. Try to fill half of your plate at each meal with fruits and vegetables. ? Choosing lean protein foods, such as lean cuts of meat, poultry without skin, fish, tofu, beans, and nuts. ? Eating low-fat dairy products. ? Avoiding foods that are high in salt (sodium). This can help lower blood pressure. ? Avoiding foods that have saturated fat, trans fat, and cholesterol. This can help prevent high cholesterol. ? Avoiding processed and premade foods.  Follow your  health care provider's specific guidelines for losing weight, controlling high blood pressure (hypertension), lowering high cholesterol, and managing diabetes. These may include: ? Reducing your daily calorie intake. ? Limiting your daily sodium intake to 1,500 milligrams (mg). ? Using only healthy fats for cooking, such as olive oil, canola oil, or sunflower oil. ? Counting your daily carbohydrate intake.   What lifestyle changes can be made?  Maintain a healthy weight. Talk to your health care provider about your ideal weight.  Get at least 30 minutes of moderate physical activity at least 5 days a week. Moderate activity includes brisk walking, biking, and swimming.  Do not use any products that contain nicotine or tobacco, such as cigarettes and e-cigarettes. If you need help quitting, ask your health care provider. It may also be helpful to avoid exposure to secondhand smoke.  Limit alcohol intake to no more than 1 drink a day for nonpregnant women and 2 drinks a day for men. One drink equals 12 oz of beer, 5 oz of wine, or 1 oz of hard liquor.  Stop any illegal drug use.  Avoid taking birth control pills. Talk to your health care provider about the risks of taking birth control pills if: ? You are over 31 years old. ? You smoke. ? You get migraines. ? You have ever had a blood clot. What other changes can be made?  Manage your cholesterol levels. ? Eating a healthy diet is important for preventing high cholesterol. If cholesterol cannot be managed through diet alone, you may also need to take medicines. ? Take any prescribed medicines to control your cholesterol as told  by your health care provider.  Manage your diabetes. ? Eating a healthy diet and exercising regularly are important parts of managing your blood sugar. If your blood sugar cannot be managed through diet and exercise, you may need to take medicines. ? Take any prescribed medicines to control your diabetes as told  by your health care provider.  Control your hypertension. ? To reduce your risk of stroke, try to keep your blood pressure below 130/80. ? Eating a healthy diet and exercising regularly are an important part of controlling your blood pressure. If your blood pressure cannot be managed through diet and exercise, you may need to take medicines. ? Take any prescribed medicines to control hypertension as told by your health care provider. ? Ask your health care provider if you should monitor your blood pressure at home. ? Have your blood pressure checked every year, even if your blood pressure is normal. Blood pressure increases with age and some medical conditions.  Get evaluated for sleep disorders (sleep apnea). Talk to your health care provider about getting a sleep evaluation if you snore a lot or have excessive sleepiness.  Take over-the-counter and prescription medicines only as told by your health care provider. Aspirin or blood thinners (antiplatelets or anticoagulants) may be recommended to reduce your risk of forming blood clots that can lead to stroke.  Make sure that any other medical conditions you have, such as atrial fibrillation or atherosclerosis, are managed. What are the warning signs of a stroke? The warning signs of a stroke can be easily remembered as BEFAST.  B is for balance. Signs include: ? Dizziness. ? Loss of balance or coordination. ? Sudden trouble walking.  E is for eyes. Signs include: ? A sudden change in vision. ? Trouble seeing.  F is for face. Signs include: ? Sudden weakness or numbness of the face. ? The face or eyelid drooping to one side.  A is for arms. Signs include: ? Sudden weakness or numbness of the arm, usually on one side of the body.  S is for speech. Signs include: ? Trouble speaking (aphasia). ? Trouble understanding.  T is for time. ? These symptoms may represent a serious problem that is an emergency. Do not wait to see if the  symptoms will go away. Get medical help right away. Call your local emergency services (911 in the [place] Do not drive yourself to the hospital.  Other signs of stroke may include: ? A sudden, severe headache with no known cause. ? Nausea or vomiting. ? Seizure. Where to find more information For more information, visit:  American Stroke Association: www.strokeassociation.org  National Stroke Association: www.stroke.org Summary  You can prevent a stroke by eating healthy, exercising, not smoking, limiting alcohol intake, and managing any medical conditions you may have.  Do not use any products that contain nicotine or tobacco, such as cigarettes and e-cigarettes. If you need help quitting, ask your health care provider. It may also be helpful to avoid exposure to secondhand smoke.  Remember BEFAST for warning signs of stroke. Get help right away if you or a loved one has any of these signs. This information is not intended to replace advice given to you by your health care provider. Make sure you discuss any questions you have with your health care provider. Document Revised: 09/17/2017 Document Reviewed: 11/10/2016 Elsevier Patient Education  2021 [employer].

## 2021-01-23 ENCOUNTER — Other Ambulatory Visit: Payer: Self-pay

## 2021-01-23 ENCOUNTER — Ambulatory Visit: Payer: BC Managed Care – PPO | Admitting: Occupational Therapy

## 2021-01-23 DIAGNOSIS — M6281 Muscle weakness (generalized): Secondary | ICD-10-CM

## 2021-01-23 DIAGNOSIS — R278 Other lack of coordination: Secondary | ICD-10-CM

## 2021-01-23 LAB — LIPID PANEL
Chol/HDL Ratio: 2.4 ratio (ref 0.0–4.4)
Cholesterol, Total: 104 mg/dL (ref 100–199)
HDL: 44 mg/dL (ref >39)
LDL Chol Calc (NIH): 42 mg/dL (ref 0–99)
Triglycerides: 92 mg/dL (ref 0–149)
VLDL Cholesterol Cal: 18 mg/dL (ref 5–40)

## 2021-01-23 NOTE — Therapy (Signed)
[place] [facility]IN [facility] SERVICES [address] [place], [place], [address] Phone: [phone]   Fax:  [phone]  Occupational Therapy Treatment  Patient Details  Name: Dana Henry MRN: [id number] Date of Birth: 1981/06/01 No data recorded  Encounter Date: 01/23/2021   OT End of Session - 01/23/21 1317    Visit Number 2    Number of Visits 24    Date for OT Re-Evaluation 04/15/21    Authorization Type Progress report period starting 01/21/2021    OT Start Time 1300    OT Stop Time 1345    OT Time Calculation (min) 45 min    Activity Tolerance Patient tolerated treatment well    Behavior During Therapy [facility] for tasks assessed/performed           Past Medical History:  Diagnosis Date  . Anemia   . CVA (cerebral vascular accident) ([place])   . History of cesarean delivery affecting pregnancy 08/10/2017  . Kidney stones    surgically removed at 40YO and 40YO  . Migraines    associated with menstrual cycle  . UTI (urinary tract infection)   . Vaginal Pap smear, abnormal     Past Surgical History:  Procedure Laterality Date  . BUBBLE STUDY  11/18/2020   Procedure: BUBBLE STUDY;  Surgeon: Elouise Munroe, MD;  Location: [place];  Service: Cardiology;;  . CESAREAN SECTION N/A 07/21/2014   Procedure: CESAREAN SECTION;  Surgeon: Elveria Royals, MD;  Location: [facility];  Service: Obstetrics;  Laterality: N/A;  . CESAREAN SECTION N/A 08/10/2017   Procedure: Repeat CESAREAN SECTION;  Surgeon: Azucena Fallen, MD;  Location: [place];  Service: Obstetrics;  Laterality: N/A;  EDD: 08/17/17 Allergy: Sulfa  . IR ANGIO INTRA EXTRACRAN SEL COM CAROTID INNOMINATE BILAT MOD SED  01/16/2021  . IR CT HEAD LTD  11/13/2020  . IR INTRA CRAN STENT  11/13/2020  . IR PERCUTANEOUS ART THROMBECTOMY/INFUSION INTRACRANIAL INC DIAG ANGIO  11/13/2020  . IR RADIOLOGIST EVAL & MGMT  01/21/2021  . KIDNEY STONE SURGERY    . RADIOLOGY WITH ANESTHESIA N/A  11/13/2020   Procedure: IR WITH ANESTHESIA;  Surgeon: Radiologist, Medication, MD;  Location: [facility];  Service: Radiology;  Laterality: N/A;  . TEE WITHOUT CARDIOVERSION N/A 11/18/2020   Procedure: TRANSESOPHAGEAL ECHOCARDIOGRAM (TEE);  Surgeon: Elouise Munroe, MD;  Location: [facility];  Service: Cardiology;  Laterality: N/A;  . TONSILLECTOMY AND ADENOIDECTOMY  1991    There were no vitals filed for this visit.   Subjective Assessment - 01/23/21 1314    Subjective  Pt. reports having had her follow-up appointment with the neuroligist this week.    Pertinent History Pt. is a 40 y.o. 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 Pt. is s/p IR with mechanical thrombectomy, and TICI 3 revascularization. Pt. is s/p rescue angioplasty, and stenting for Internal Carotid Petrous seg stenosis. Pt. PMHx: [id number]-03 10/21/2019.  Pt. 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 Pt. has recently moved from [place] to [place] on the day of the onset of the CVA.    Limitations Left sided weakness, and FMC    Currently in Pain? No/denies          OT TREATMENT    Neuro muscular re-education:  Pt. worked on left hand [facility] skills grasping 1" sticks, 1/4" collars, and 1/4" washers. Pt. worked on storing  the objects in the palm, and translatory skills moving the items from the palm of the hand to the tip of the 2nd digit, and thumb. Pt. worked on removing the pegs using bilateral alternating hand patterns. Pt. performed [facility] tasks using the Grooved pegboard. Pt. worked on grasping the grooved pegs from a horizontal position, and moving the pegs to a vertical position in the hand to prepare for placing them in the grooved slot. Pt. worked on grasping 1" resistive cubes alternating thumb opposition to the tip of the 2nd through 5th digits while the board is placed at a vertical angle. Pt. worked on pressing the cubes  back into place while alternating isolated 2nd through 5th digit extension.  Therapeutic Exercise:  Pt. performed left gross gripping with grip strengthener. Pt. worked on sustaining grip while grasping pegs and reaching at various heights. The gripper was set at 23.4# of grip strength force  Pt. was able to perform translatory movements of the hand with the 1" sticks, and the 1/4" collars. Pt. was able to store the washers in the palm of her hand, however had difficulty with performing the translatory movements with the washers. Pt. continues to work on improving UE strength, and [facility] skills in order to be able to fasten small buttons on children's clothing, and work towards improving, and maximizing independence with ADLs, and IADL tasks.                       OT Education - 01/23/21 1317    Education Details OT services POC, Goals    Person(s) Educated Patient    Methods Explanation;Demonstration    Comprehension Verbalized understanding;Returned demonstration;Verbal cues required               OT Long Term Goals - 01/21/21 1605      OT LONG TERM GOAL #1   Title Pt. will improve left grip strength to be able to opens containers, and jars.    Baseline Eval: Pt. has difficulty    Time 12    Period Weeks    Status New    Target Date 04/15/21      OT LONG TERM GOAL #2   Title Pt. will independently, and efficiently perform typing skills in preparation for work related tasks.    Baseline Eval: Pt. has difficulty completing work related typing tasks.    Time 12    Period Weeks    Status New    Target Date 04/15/21      OT LONG TERM GOAL #3   Title Pt. will improve left hand [facility] skills in order to be able to independently hold a makeup applicator without dropping it.    Baseline Eval: pt. frequently drops makeup applicators with her left hand    Time 12    Period Weeks    Status New    Target Date 04/15/21      OT LONG TERM GOAL #4   Title Pt. will  independently fasten small buttons efficiently on children's clothing.    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    Time 12    Period Weeks    Status New    Target Date 04/15/21      OT LONG TERM GOAL #5   Title Pt. will independently, and efficiently stir with her left hand while baking.    Baseline Eval: Pt. has difficulty with LUE motor control while stirring.    Time  12    Period Weeks    Status New    Target Date 04/15/21      Long Term Additional Goals   Additional Long Term Goals --      OT LONG TERM GOAL #6   Title Pt. will improve LUE strength to be able to place items on higher shelves.    Baseline Eval: Pt. has difficulty placing weighted items onto higher shelves.    Time 12    Period Weeks    Status New    Target Date 04/15/21      OT LONG TERM GOAL #7   Title Pt. will demonstrate significantly relevant FOTO score to promote improved ADL/IADL independence    Baseline Eval: TBD    Time 12    Period Weeks    Status New    Target Date 04/15/21                 Plan - 01/23/21 1318    Clinical Impression Statement Pt. was able to perform translatory movements of the hand with the 1" sticks, and the 1/4" collars. Pt. was able to store the washers in the palm of her hand, however had difficulty with performing the translatory movements with the washers. Pt. continues to work on improving UE strength, and [facility] skills in order to be able to fasten small buttons on children's clothing, and work towards improving, and maximizing independence with ADLs, and IADL tasks.   OT Occupational Profile and History Detailed Assessment- Review of Records and additional review of physical, cognitive, psychosocial history related to current functional performance    Occupational performance deficits (Please refer to evaluation for details): ADL's;IADL's;Leisure    Body Structure / Function / Physical Skills ADL;IADL;Strength;ROM;Coordination     Rehab Potential Excellent    Clinical Decision Making Several treatment options, min-mod task modification necessary    Comorbidities Affecting Occupational Performance: May have comorbidities impacting occupational performance    Modification or Assistance to Complete Evaluation  Min-Moderate modification of tasks or assist with assess necessary to complete eval    OT Frequency 2x / week    OT Duration 12 weeks    OT Treatment/Interventions Self-care/ADL training;DME and/or AE instruction;Therapeutic exercise;Neuromuscular education;Patient/family education;Therapeutic activities    Consulted and Agree with Plan of Care Patient           Patient will benefit from skilled therapeutic intervention in order to improve the following deficits and impairments:   Body Structure / Function / Physical Skills: ADL,IADL,Strength,ROM,Coordination       Visit Diagnosis: Muscle weakness (generalized)  Other lack of coordination    Problem List Patient Active Problem List   Diagnosis Date Noted  . Chronic tension-type headache, not intractable 01/09/2021  . Class 1 obesity due to excess calories with serious comorbidity and body mass index (BMI) of 30.0 to 30.9 in adult   . Slow transit constipation   . Hypoalbuminemia due to protein-calorie malnutrition ([place])   . Marijuana abuse   . Left hemiparesis ([place])   . 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 Dysphagia, post-stroke   . Acute blood loss anemia   . Stroke (cerebrum) ([place]) 11/13/2020  . Internal carotid artery stenosis, right 11/13/2020  . Migraines 10/06/2012    Harrel Carina, MS, OTR/L 01/23/2021, 1:23 PM  [place] [facility]IN [facility] SERVICES [address] [place], [place], [address] Phone: [phone]   Fax:  [phone]  Name: Dana Henry MRN: [id number] Date of Birth:  10/03/1981 

## 2021-01-27 ENCOUNTER — Other Ambulatory Visit: Payer: Self-pay

## 2021-01-27 ENCOUNTER — Ambulatory Visit: Payer: BC Managed Care – PPO | Admitting: Occupational Therapy

## 2021-01-27 ENCOUNTER — Encounter: Payer: Self-pay | Admitting: Occupational Therapy

## 2021-01-27 DIAGNOSIS — R278 Other lack of coordination: Secondary | ICD-10-CM

## 2021-01-27 DIAGNOSIS — M6281 Muscle weakness (generalized): Secondary | ICD-10-CM

## 2021-01-27 NOTE — Therapy (Signed)
[place] MAIN [facility] SERVICES [address] [place], [place], [address] Phone: [phone]   Fax:  [phone]  Occupational Therapy Treatment  Patient Details  Name: Dana Henry MRN: [id number] Date of Birth: [birth year] No data recorded  Encounter Date: 01/27/2021   OT End of Session - 01/27/21 1326    Visit Number 3    Number of Visits 24    Date for OT Re-Evaluation 04/15/21    Authorization Type Progress report period starting 01/21/2021    OT Start Time 1317    OT Stop Time 1345    OT Time Calculation (min) 28 min    Activity Tolerance Patient tolerated treatment well    Behavior During Therapy [facility] for tasks assessed/performed           Past Medical History:  Diagnosis Date  . Anemia   . CVA (cerebral vascular accident) ([place])   . History of cesarean delivery affecting pregnancy 08/10/2017  . Kidney stones    surgically removed at 40YO and 40YO  . Migraines    associated with menstrual cycle  . UTI (urinary tract infection)   . Vaginal Pap smear, abnormal     Past Surgical History:  Procedure Laterality Date  . BUBBLE STUDY  11/18/2020   Procedure: BUBBLE STUDY;  Surgeon: Elouise Munroe, MD;  Location: [place];  Service: Cardiology;;  . CESAREAN SECTION N/A 07/21/2014   Procedure: CESAREAN SECTION;  Surgeon: Elveria Royals, MD;  Location: [facility];  Service: Obstetrics;  Laterality: N/A;  . CESAREAN SECTION N/A 08/10/2017   Procedure: Repeat CESAREAN SECTION;  Surgeon: Azucena Fallen, MD;  Location: [place];  Service: Obstetrics;  Laterality: N/A;  EDD: 08/17/17 Allergy: Sulfa  . IR ANGIO INTRA EXTRACRAN SEL COM CAROTID INNOMINATE BILAT MOD SED  01/16/2021  . IR CT HEAD LTD  11/13/2020  . IR INTRA CRAN STENT  11/13/2020  . IR PERCUTANEOUS ART THROMBECTOMY/INFUSION INTRACRANIAL INC DIAG ANGIO  11/13/2020  . IR RADIOLOGIST EVAL & MGMT  01/21/2021  . KIDNEY STONE SURGERY    . RADIOLOGY WITH ANESTHESIA N/A  11/13/2020   Procedure: IR WITH ANESTHESIA;  Surgeon: Radiologist, Medication, MD;  Location: [place];  Service: Radiology;  Laterality: N/A;  . TEE WITHOUT CARDIOVERSION N/A 11/18/2020   Procedure: TRANSESOPHAGEAL ECHOCARDIOGRAM (TEE);  Surgeon: Elouise Munroe, MD;  Location: [place];  Service: Cardiology;  Laterality: N/A;  . TONSILLECTOMY AND ADENOIDECTOMY  1991    There were no vitals filed for this visit.   Subjective Assessment - 01/27/21 1324    Subjective  Pt. 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    Pertinent History Pt. is a 40 y.o. 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 Pt. is s/p IR with mechanical thrombectomy, and TICI 3 revascularization. Pt. is s/p rescue angioplasty, and stenting for Internal Carotid Petrous seg stenosis. Pt. PMHx: [id number] 10/21/2019.  Pt. 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 Pt. has recently moved from [place] to [place] on the day of the onset of the CVA.    Currently in Pain? No/denies          OT TREATMENT    Neuro muscular re-education:  Pt. performed left hand [place] tasks using the Grooved pegboard. Pt. worked on grasping the grooved pegs from a horizontal position, and moving the pegs to a vertical  position in the hand to prepare for placing them in the grooved slot. Pt. Worked on translatory movements with the pegs moving them from her palm to the tip of 2nd digit, and thumb in preparation for turning them into the correct direction. Pt. worked on [facility] skills using the [employer] Task. Pt. worked on sustaining grasp on the resistive tweezers while grasping this sticks, and moving them from a horizontal position to a vertical position to prepare for placing them into the pegboard. Pt. Required verbal cues, and cues for visual demonstration for wrist position, and hand pattern when placing them into the  pegboard.  Therapeutic Ex:  Pt. worked on pinch strengthening in the left hand for lateral, and 3pt. pinch using yellow, red, green, blue, and black resistive clips pt. Removed them alternating thumb opposition to the tip of her 2nd through 5th digits. Pt. worked on placing the clips at various vertical and horizontal angles. Tactile and verbal cues were required for eliciting the desired movement.  Pt. was late for the session today. Pt. Continues to make progress with left hand [facility] skills. Pt. Was able to grasp, and store more items in her hand hand, and dropped less pegs. Pt. Was able to manipulate the tweezers, however at times presented with difficulty grading the appropriate amount of pressure needed to grasp from a horizontal position, and place them vertically in the pegboard.  Pt. continues to work on improving left hand strength, and  [facility] skills to be able to button children's clothing.                           OT Education - 01/27/21 1325    Education Details OT services POC, Goals    Person(s) Educated Patient    Methods Explanation;Demonstration    Comprehension Verbalized understanding;Returned demonstration;Verbal cues required               OT Long Term Goals - 01/21/21 1605      OT LONG TERM GOAL #1   Title Pt. will improve left grip strength to be able to opens containers, and jars.    Baseline Eval: Pt. has difficulty    Time 12    Period Weeks    Status New    Target Date 04/15/21      OT LONG TERM GOAL #2   Title Pt. will independently, and efficiently perform typing skills in preparation for work related tasks.    Baseline Eval: Pt. has difficulty completing work related typing tasks.    Time 12    Period Weeks    Status New    Target Date 04/15/21      OT LONG TERM GOAL #3   Title Pt. will improve left hand [facility] skills in order to be able to independently hold a makeup applicator without dropping it.    Baseline Eval: pt.  frequently drops makeup applicators with her left hand    Time 12    Period Weeks    Status New    Target Date 04/15/21      OT LONG TERM GOAL #4   Title Pt. will independently fasten small buttons efficiently on children's clothing.    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    Time 12    Period Weeks    Status New    Target Date 04/15/21      OT LONG TERM GOAL #5  Title Pt. will independently, and efficiently stir with her left hand while baking.    Baseline Eval: Pt. has difficulty with LUE motor control while stirring.    Time 12    Period Weeks    Status New    Target Date 04/15/21      Long Term Additional Goals   Additional Long Term Goals --      OT LONG TERM GOAL #6   Title Pt. will improve LUE strength to be able to place items on higher shelves.    Baseline Eval: Pt. has difficulty placing weighted items onto higher shelves.    Time 12    Period Weeks    Status New    Target Date 04/15/21      OT LONG TERM GOAL #7   Title Pt. will demonstrate significantly relevant FOTO score to promote improved ADL/IADL independence    Baseline Eval: TBD    Time 12    Period Weeks    Status New    Target Date 04/15/21                 Plan - 01/27/21 1326    Clinical Impression Statement Pt. was late for the session today. Pt. Continues to make progress with left hand [facility] skills. Pt. Was able to grasp, and store more items in her hand hand, and dropped less pegs. Pt. Was able to manipulate the tweezers, however at times presented with difficulty grading the appropriate amount of pressure needed to grasp from a horizontal position, and place them vertically in the pegboard.  Pt. continues to work on improving left hand strength, and  [facility] skills to be able to button children's clothing.     OT Occupational Profile and History Detailed Assessment- Review of Records and additional review of physical, cognitive, psychosocial history  related to current functional performance    Occupational performance deficits (Please refer to evaluation for details): ADL's;IADL's;Leisure    Body Structure / Function / Physical Skills ADL;IADL;Strength;ROM;Coordination    Rehab Potential Excellent    Clinical Decision Making Several treatment options, min-mod task modification necessary    Comorbidities Affecting Occupational Performance: May have comorbidities impacting occupational performance    Modification or Assistance to Complete Evaluation  Min-Moderate modification of tasks or assist with assess necessary to complete eval    OT Frequency 2x / week    OT Duration 12 weeks    OT Treatment/Interventions Self-care/ADL training;DME and/or AE instruction;Therapeutic exercise;Neuromuscular education;Patient/family education;Therapeutic activities    Consulted and Agree with Plan of Care Patient           Patient will benefit from skilled therapeutic intervention in order to improve the following deficits and impairments:   Body Structure / Function / Physical Skills: ADL,IADL,Strength,ROM,Coordination       Visit Diagnosis: Muscle weakness (generalized)  Other lack of coordination    Problem List Patient Active Problem List   Diagnosis Date Noted  . Chronic tension-type headache, not intractable 01/09/2021  . Class 1 obesity due to excess calories with serious comorbidity and body mass index (BMI) of 30.0 to 30.9 in adult   . Slow transit constipation   . Hypoalbuminemia due to protein-calorie malnutrition ([place])   . Marijuana abuse   . Left hemiparesis ([place])   . 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 Dysphagia, post-stroke   . Acute blood loss anemia   . Stroke (cerebrum) ([place]) 11/13/2020  . Internal carotid artery stenosis, right 11/13/2020  . Migraines  10/06/2012    Harrel Carina, MS, OTR/L 01/27/2021, 1:31 PM  [place] MAIN [facility] SERVICES [address] [place], [place], [address] Phone: [phone]   Fax:  [phone]  Name: Dana Henry MRN: [id number] Date of Birth: [birth year]

## 2021-01-27 NOTE — Progress Notes (Signed)
Kindly inform the patient that lipid profile was quite satisfactory.

## 2021-01-28 ENCOUNTER — Other Ambulatory Visit (HOSPITAL_COMMUNITY): Payer: Self-pay | Admitting: Interventional Radiology

## 2021-01-28 ENCOUNTER — Encounter: Payer: Self-pay | Admitting: *Deleted

## 2021-01-28 DIAGNOSIS — I729 Aneurysm of unspecified site: Secondary | ICD-10-CM

## 2021-01-29 ENCOUNTER — Ambulatory Visit: Payer: BC Managed Care – PPO | Admitting: Occupational Therapy

## 2021-01-29 ENCOUNTER — Encounter: Payer: BC Managed Care – PPO | Admitting: Occupational Therapy

## 2021-01-29 DIAGNOSIS — Z01419 Encounter for gynecological examination (general) (routine) without abnormal findings: Secondary | ICD-10-CM | POA: Diagnosis not present

## 2021-01-29 DIAGNOSIS — Z1231 Encounter for screening mammogram for malignant neoplasm of breast: Secondary | ICD-10-CM | POA: Diagnosis not present

## 2021-01-29 DIAGNOSIS — Z124 Encounter for screening for malignant neoplasm of cervix: Secondary | ICD-10-CM | POA: Diagnosis not present

## 2021-01-29 DIAGNOSIS — Z6829 Body mass index (BMI) 29.0-29.9, adult: Secondary | ICD-10-CM | POA: Diagnosis not present

## 2021-01-29 LAB — HM MAMMOGRAPHY

## 2021-02-03 ENCOUNTER — Other Ambulatory Visit (HOSPITAL_COMMUNITY): Payer: Self-pay

## 2021-02-03 ENCOUNTER — Other Ambulatory Visit (HOSPITAL_COMMUNITY)
Admission: RE | Admit: 2021-02-03 | Discharge: 2021-02-03 | Disposition: A | Discharge status: Home / Self Care | Payer: BC Managed Care – PPO | Source: Ambulatory Visit | Attending: Interventional Radiology | Admitting: Interventional Radiology

## 2021-02-03 ENCOUNTER — Ambulatory Visit: Payer: BC Managed Care – PPO | Admitting: Occupational Therapy

## 2021-02-03 DIAGNOSIS — Z8673 Personal history of transient ischemic attack (TIA), and cerebral infarction without residual deficits: Secondary | ICD-10-CM | POA: Diagnosis not present

## 2021-02-03 DIAGNOSIS — G43909 Migraine, unspecified, not intractable, without status migrainosus: Secondary | ICD-10-CM | POA: Diagnosis not present

## 2021-02-03 DIAGNOSIS — Z98891 History of uterine scar from previous surgery: Secondary | ICD-10-CM | POA: Diagnosis not present

## 2021-02-03 DIAGNOSIS — Z87891 Personal history of nicotine dependence: Secondary | ICD-10-CM | POA: Diagnosis not present

## 2021-02-03 DIAGNOSIS — Z7982 Long term (current) use of aspirin: Secondary | ICD-10-CM | POA: Diagnosis not present

## 2021-02-03 DIAGNOSIS — Z01812 Encounter for preprocedural laboratory examination: Secondary | ICD-10-CM | POA: Insufficient documentation

## 2021-02-03 DIAGNOSIS — D62 Acute posthemorrhagic anemia: Secondary | ICD-10-CM | POA: Diagnosis not present

## 2021-02-03 DIAGNOSIS — I6521 Occlusion and stenosis of right carotid artery: Secondary | ICD-10-CM | POA: Diagnosis not present

## 2021-02-03 DIAGNOSIS — I729 Aneurysm of unspecified site: Secondary | ICD-10-CM

## 2021-02-03 DIAGNOSIS — Z20822 Contact with and (suspected) exposure to covid-19: Secondary | ICD-10-CM | POA: Insufficient documentation

## 2021-02-03 DIAGNOSIS — I671 Cerebral aneurysm, nonruptured: Secondary | ICD-10-CM | POA: Diagnosis not present

## 2021-02-03 DIAGNOSIS — I720 Aneurysm of carotid artery: Secondary | ICD-10-CM | POA: Diagnosis not present

## 2021-02-03 DIAGNOSIS — J189 Pneumonia, unspecified organism: Secondary | ICD-10-CM | POA: Diagnosis not present

## 2021-02-03 DIAGNOSIS — Z882 Allergy status to sulfonamides status: Secondary | ICD-10-CM | POA: Diagnosis not present

## 2021-02-03 DIAGNOSIS — Z79899 Other long term (current) drug therapy: Secondary | ICD-10-CM | POA: Diagnosis not present

## 2021-02-03 DIAGNOSIS — Z87442 Personal history of urinary calculi: Secondary | ICD-10-CM | POA: Diagnosis not present

## 2021-02-03 DIAGNOSIS — I63231 Cerebral infarction due to unspecified occlusion or stenosis of right carotid arteries: Secondary | ICD-10-CM | POA: Diagnosis not present

## 2021-02-03 LAB — PLATELET INHIBITION P2Y12: Platelet Function  P2Y12: 15 [PRU] — ABNORMAL LOW (ref 182–335)

## 2021-02-04 ENCOUNTER — Encounter (HOSPITAL_COMMUNITY): Payer: Self-pay | Admitting: Interventional Radiology

## 2021-02-04 ENCOUNTER — Telehealth: Payer: Self-pay | Admitting: Family Medicine

## 2021-02-04 ENCOUNTER — Other Ambulatory Visit: Payer: Self-pay | Admitting: Registered Nurse

## 2021-02-04 ENCOUNTER — Other Ambulatory Visit: Payer: Self-pay | Admitting: Radiology

## 2021-02-04 ENCOUNTER — Other Ambulatory Visit: Payer: Self-pay

## 2021-02-04 ENCOUNTER — Other Ambulatory Visit: Payer: Self-pay | Admitting: Neurology

## 2021-02-04 LAB — SARS CORONAVIRUS 2 (TAT 6-24 HRS): SARS Coronavirus 2: NEGATIVE

## 2021-02-04 NOTE — Anesthesia Preprocedure Evaluation (Addendum)
Anesthesia Evaluation  Patient identified by MRN, date of birth, ID band Patient awake    Reviewed: Allergy & Precautions, NPO status , Patient's Chart, lab work & pertinent test results  Airway Mallampati: II  TM Distance: >3 FB Neck ROM: Full    Dental  (+) Teeth Intact, Dental Advisory Given, Chipped,    Pulmonary neg pulmonary ROS, former smoker,    Pulmonary exam normal breath sounds clear to auscultation       Cardiovascular negative cardio ROS Normal cardiovascular exam Rhythm:Regular Rate:Normal     Neuro/Psych  Headaches, Aneurysm  CVA, Residual Symptoms    GI/Hepatic Neg liver ROS, GERD  Medicated,  Endo/Other  negative endocrine ROS  Renal/GU negative Renal ROS     Musculoskeletal negative musculoskeletal ROS (+)   Abdominal   Peds  Hematology  (+) Blood dyscrasia (Brilinta), ,   Anesthesia Other Findings   Reproductive/Obstetrics                            Anesthesia Physical Anesthesia Plan  ASA: IV  Anesthesia Plan: General   Post-op Pain Management:    Induction: Intravenous  PONV Risk Score and Plan: 3 and Dexamethasone, Ondansetron and Midazolam  Airway Management Planned: Oral ETT  Additional Equipment: Arterial line  Intra-op Plan:   Post-operative Plan: Extubation in OR  Informed Consent: I have reviewed the patients History and Physical, chart, labs and discussed the procedure including the risks, benefits and alternatives for the proposed anesthesia with the patient or authorized representative who has indicated his/her understanding and acceptance.     Dental advisory given  Plan Discussed with: CRNA  Anesthesia Plan Comments:        Anesthesia Quick Evaluation

## 2021-02-04 NOTE — Telephone Encounter (Signed)
Recv'd records from [place] forwarded 1 page to Dr. Janett Billow Copland 4/19/22fbg

## 2021-02-04 NOTE — Progress Notes (Signed)
Dana Henry denies chest pain or shortness of breath. Patient was tested for Covid and has been in quarantine since that time.  Dana Henry confirmed that she is taking ASA and Brilinta,

## 2021-02-05 ENCOUNTER — Ambulatory Visit (HOSPITAL_COMMUNITY)
Admission: RE | Admit: 2021-02-05 | Discharge: 2021-02-05 | Disposition: A | Discharge status: Home / Self Care | Payer: BC Managed Care – PPO | Source: Ambulatory Visit | Attending: Interventional Radiology | Admitting: Interventional Radiology

## 2021-02-05 ENCOUNTER — Encounter (HOSPITAL_COMMUNITY)
Admission: AD | Disposition: A | Discharge status: Home / Self Care | Payer: Self-pay | Source: Home / Self Care | Attending: Interventional Radiology

## 2021-02-05 ENCOUNTER — Other Ambulatory Visit: Payer: Self-pay

## 2021-02-05 ENCOUNTER — Ambulatory Visit: Payer: BC Managed Care – PPO | Admitting: Occupational Therapy

## 2021-02-05 ENCOUNTER — Inpatient Hospital Stay (HOSPITAL_COMMUNITY)
Admission: AD | Admit: 2021-02-05 | Discharge: 2021-02-06 | DRG: 027 | Disposition: A | Discharge status: Home / Self Care | Payer: BC Managed Care – PPO | Attending: Interventional Radiology | Admitting: Interventional Radiology

## 2021-02-05 ENCOUNTER — Ambulatory Visit (HOSPITAL_COMMUNITY): Payer: BC Managed Care – PPO | Admitting: Certified Registered Nurse Anesthetist

## 2021-02-05 ENCOUNTER — Encounter (HOSPITAL_COMMUNITY): Payer: Self-pay | Admitting: Interventional Radiology

## 2021-02-05 DIAGNOSIS — I6521 Occlusion and stenosis of right carotid artery: Secondary | ICD-10-CM | POA: Diagnosis present

## 2021-02-05 DIAGNOSIS — Z98891 History of uterine scar from previous surgery: Secondary | ICD-10-CM | POA: Diagnosis not present

## 2021-02-05 DIAGNOSIS — G43909 Migraine, unspecified, not intractable, without status migrainosus: Secondary | ICD-10-CM | POA: Diagnosis present

## 2021-02-05 DIAGNOSIS — Z87891 Personal history of nicotine dependence: Secondary | ICD-10-CM

## 2021-02-05 DIAGNOSIS — Z20822 Contact with and (suspected) exposure to covid-19: Secondary | ICD-10-CM | POA: Diagnosis present

## 2021-02-05 DIAGNOSIS — Z8673 Personal history of transient ischemic attack (TIA), and cerebral infarction without residual deficits: Secondary | ICD-10-CM

## 2021-02-05 DIAGNOSIS — I729 Aneurysm of unspecified site: Secondary | ICD-10-CM

## 2021-02-05 DIAGNOSIS — D62 Acute posthemorrhagic anemia: Secondary | ICD-10-CM | POA: Diagnosis not present

## 2021-02-05 DIAGNOSIS — I671 Cerebral aneurysm, nonruptured: Principal | ICD-10-CM | POA: Diagnosis present

## 2021-02-05 DIAGNOSIS — Z882 Allergy status to sulfonamides status: Secondary | ICD-10-CM

## 2021-02-05 DIAGNOSIS — Z87442 Personal history of urinary calculi: Secondary | ICD-10-CM

## 2021-02-05 DIAGNOSIS — I7771 Dissection of carotid artery: Secondary | ICD-10-CM | POA: Diagnosis present

## 2021-02-05 DIAGNOSIS — I63231 Cerebral infarction due to unspecified occlusion or stenosis of right carotid arteries: Secondary | ICD-10-CM

## 2021-02-05 DIAGNOSIS — Z7982 Long term (current) use of aspirin: Secondary | ICD-10-CM

## 2021-02-05 DIAGNOSIS — J189 Pneumonia, unspecified organism: Secondary | ICD-10-CM | POA: Diagnosis not present

## 2021-02-05 DIAGNOSIS — Z79899 Other long term (current) drug therapy: Secondary | ICD-10-CM | POA: Diagnosis not present

## 2021-02-05 DIAGNOSIS — I720 Aneurysm of carotid artery: Secondary | ICD-10-CM | POA: Diagnosis not present

## 2021-02-05 HISTORY — PX: IR US GUIDE VASC ACCESS RIGHT: IMG2390

## 2021-02-05 HISTORY — PX: IR TRANSCATH/EMBOLIZ: IMG695

## 2021-02-05 HISTORY — PX: IR INTRA CRAN STENT: IMG2345

## 2021-02-05 HISTORY — PX: RADIOLOGY WITH ANESTHESIA: SHX6223

## 2021-02-05 HISTORY — DX: Pneumonia, unspecified organism: J18.9

## 2021-02-05 HISTORY — DX: Personal history of urinary calculi: Z87.442

## 2021-02-05 LAB — CBC WITH DIFFERENTIAL/PLATELET
Abs Immature Granulocytes: 0.01 10*3/uL (ref 0.00–0.07)
Basophils Absolute: 0.1 10*3/uL (ref 0.0–0.1)
Basophils Relative: 1 %
Eosinophils Absolute: 0.2 10*3/uL (ref 0.0–0.5)
Eosinophils Relative: 3 %
HCT: 39.9 % (ref 36.0–46.0)
Hemoglobin: 13.2 g/dL (ref 12.0–15.0)
Immature Granulocytes: 0 %
Lymphocytes Relative: 35 %
Lymphs Abs: 2.2 10*3/uL (ref 0.7–4.0)
MCH: 30.4 pg (ref 26.0–34.0)
MCHC: 33.1 g/dL (ref 30.0–36.0)
MCV: 91.9 fL (ref 80.0–100.0)
Monocytes Absolute: 0.4 10*3/uL (ref 0.1–1.0)
Monocytes Relative: 7 %
Neutro Abs: 3.3 10*3/uL (ref 1.7–7.7)
Neutrophils Relative %: 54 %
Platelets: 231 10*3/uL (ref 150–400)
RBC: 4.34 MIL/uL (ref 3.87–5.11)
RDW: 13.1 % (ref 11.5–15.5)
WBC: 6.1 10*3/uL (ref 4.0–10.5)
nRBC: 0 % (ref 0.0–0.2)

## 2021-02-05 LAB — BASIC METABOLIC PANEL
Anion gap: 5 (ref 5–15)
BUN: 15 mg/dL (ref 6–20)
CO2: 25 mmol/L (ref 22–32)
Calcium: 9 mg/dL (ref 8.9–10.3)
Chloride: 109 mmol/L (ref 98–111)
Creatinine, Ser: 0.94 mg/dL (ref 0.44–1.00)
GFR, Estimated: >60 mL/min (ref >60)
Glucose, Bld: 84 mg/dL (ref 70–99)
Potassium: 3.7 mmol/L (ref 3.5–5.1)
Sodium: 139 mmol/L (ref 135–145)

## 2021-02-05 LAB — POCT PREGNANCY, URINE: Preg Test, Ur: NEGATIVE

## 2021-02-05 LAB — HEPARIN LEVEL (UNFRACTIONATED): Heparin Unfractionated: 0.16 IU/mL — ABNORMAL LOW (ref 0.30–0.70)

## 2021-02-05 LAB — MRSA PCR SCREENING: MRSA by PCR: NEGATIVE

## 2021-02-05 LAB — PROTIME-INR
INR: 1 (ref 0.8–1.2)
Prothrombin Time: 13.4 seconds (ref 11.4–15.2)

## 2021-02-05 LAB — POCT ACTIVATED CLOTTING TIME
Activated Clotting Time: 208 seconds
Activated Clotting Time: 214 seconds

## 2021-02-05 LAB — PLATELET INHIBITION P2Y12: Platelet Function  P2Y12: 7 [PRU] — ABNORMAL LOW (ref 182–335)

## 2021-02-05 IMAGING — XA IR TRANSCATH EMBOLIZATION
8 of 9 series · 10 of 24 positions shown · IV contrast (IODINE)
Comparison: Diagnostic catheter arteriogram [DATE].

CLINICAL DATA: Mild intermittent headaches. Development of
pseudoaneurysm in the previously stented segment of the horizontal
petrous segment of the right internal carotid artery.

EXAM:
TRANSCATHETER THERAPY EMBOLIZATION
TECHNIQUE: Informed written consent was obtained from the patient after a
thorough discussion of the procedural risks, benefits and
alternatives. All questions were addressed. Maximal Sterile Barrier
Technique was utilized including caps, mask, sterile gowns, sterile
gloves, sterile drape, hand hygiene and skin antiseptic. A timeout
was performed prior to the initiation of the procedure.

[Series 1: cerebral · 2 acquisitions, 1 frame shown (1 of 8)]
[im 1/2]
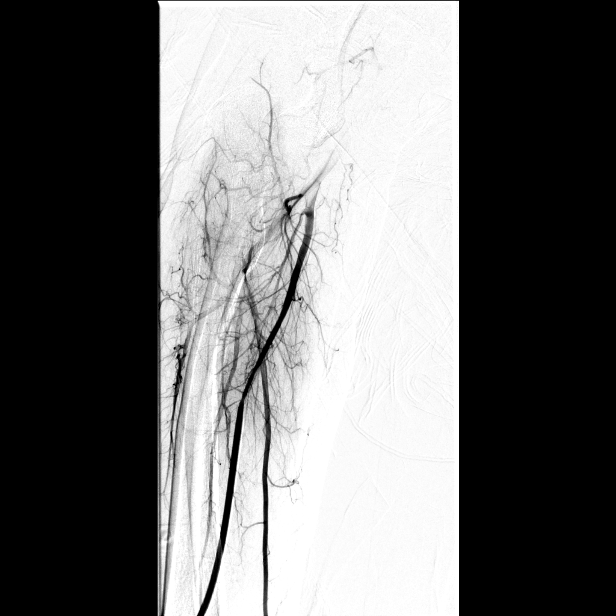

[Series 2: cerebral · 4 acquisitions, 1 frame shown (2 of 8)]
[im 1/4]
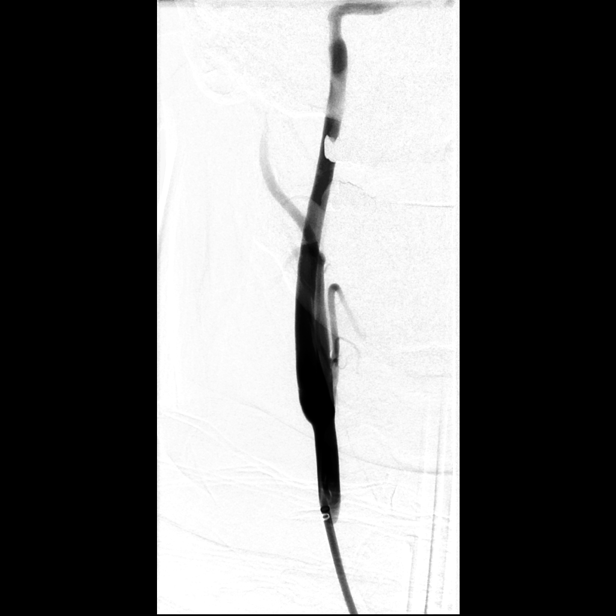

[Series 3: cerebral · 6 acquisitions, 2 frames shown (3 of 8)]
[im 1/6]
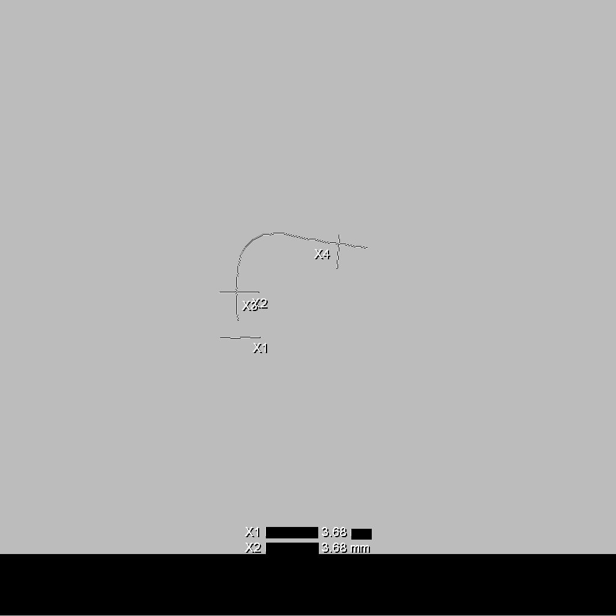
[im 2/6]
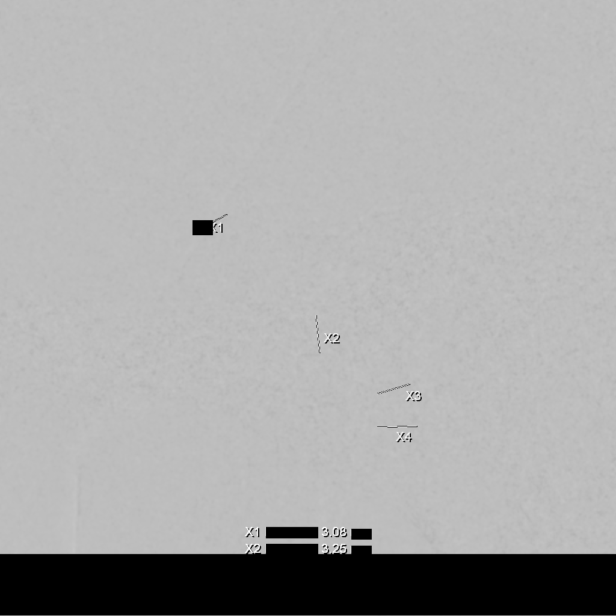

[Series 4: cerebral · 2 acquisitions, 1 frame shown (4 of 8)]
[im 1/2]
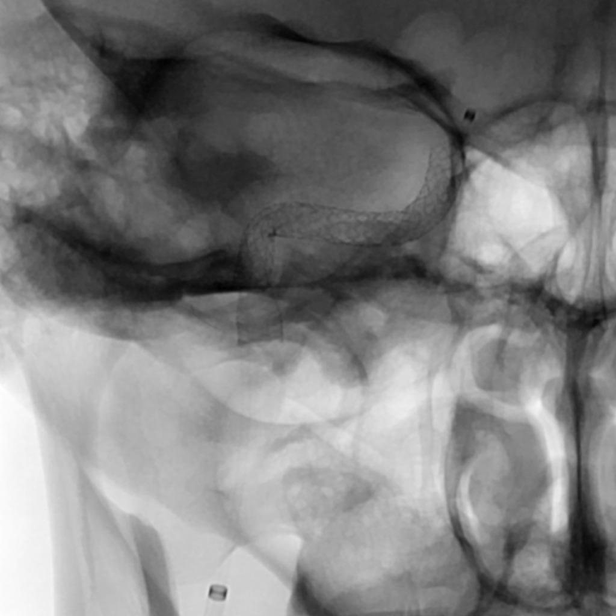

[Series 5: cerebral · 2 acquisitions, 1 frame shown (5 of 8)]
[im 1/2]
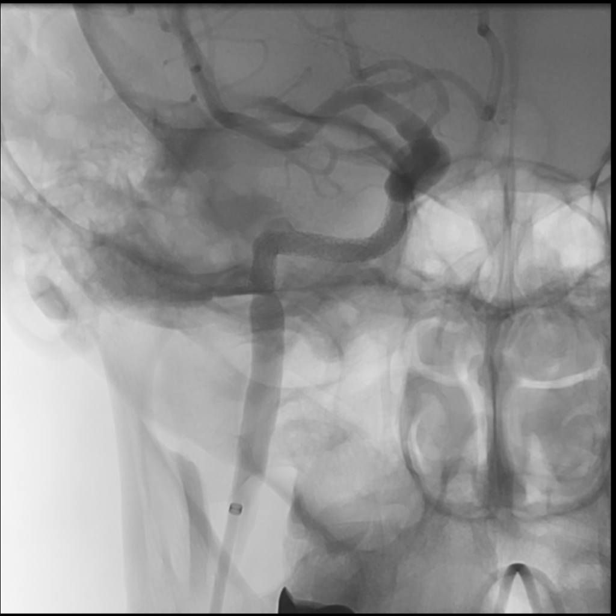

[Series 6: cerebral · 2 acquisitions, 1 frame shown (6 of 8)]
[im 1/2]
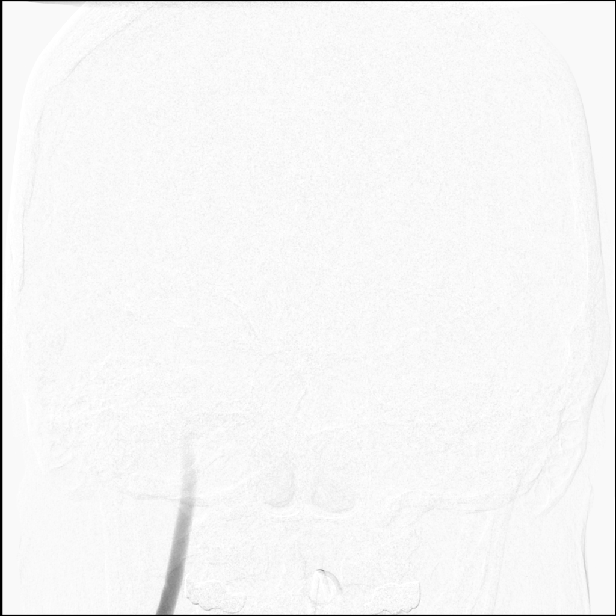

[Series 7: cerebral · 2 acquisitions, 2 frames shown (7 of 8)]
[im 1/2]
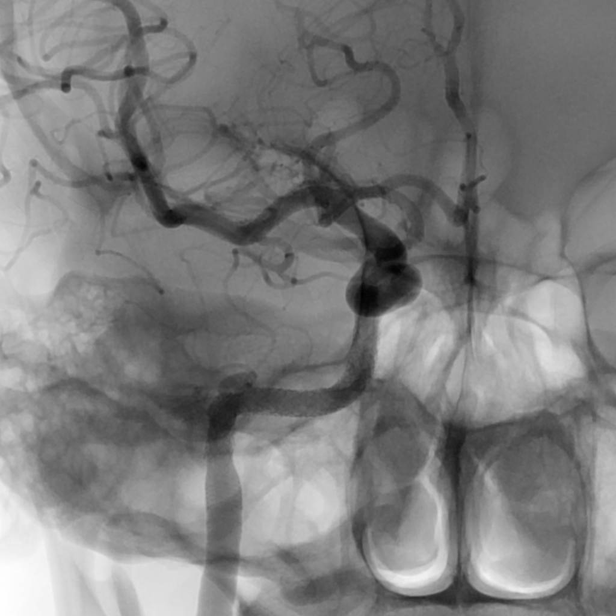
[im 2/2]
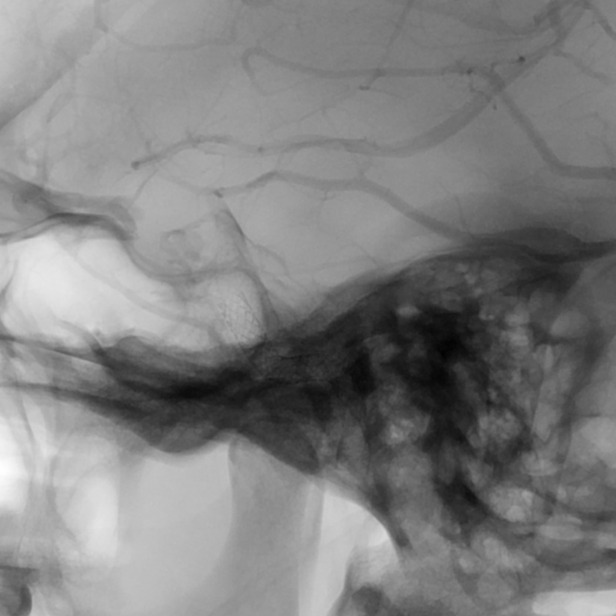

[Series 8: cerebral · 2 acquisitions, 1 frame shown (8 of 8)]
[im 2/2]
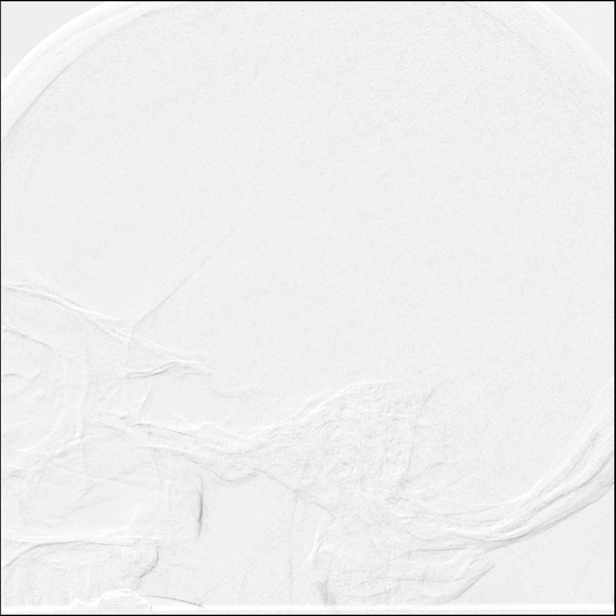

[10 of 24 positions shown; findings below may reference images not displayed]

MEDICATIONS:
Heparin [W3] units units IV. Ancef 2 g IV antibiotic was
administered within 1 hour of the procedure.

ANESTHESIA/SEDATION:
General anesthesia.

CONTRAST:  Isovue 300 approximately 60 mL

FLUOROSCOPY TIME:  Fluoroscopy Time: 15 minutes 0 seconds (641 mGy).

COMPLICATIONS:
None immediate.
The right forearm to the wrist was prepped and draped in the usual
sterile manner.

The right radial artery was then identified with ultrasound, and its
morphology documented.

A dorsal palmar anastomosis was verified to be present.

Using a micropuncture set and ultrasound guidance, a [DATE] French
radial sheath was then inserted over a 0.018 inch micro guidewire.

The micro guidewire, and the obturator were removed. Good aspiration
obtained from the side port of the radial sheath. A cocktail of [W3]
units of heparin, 2.5 mg of verapamil, and 200 mcg of nitroglycerin
was then infused in diluted form through the sheath without event. A
right radial arteriogram was obtained.

Over the 0.035 inch Roadrunner guidewire, a combination of a 95 cm
[REDACTED] 7 French guide sheath in inside of which was support 5.5 ZIEYRA
ZIEYRA 2 diagnostic catheter was advanced to the aortic arch
region.

Over the 035 inch Roadrunner guidewire, and the 5.5 French support
catheter, the 7 French 95 cm [REDACTED] sheath was advanced to the distal
right common carotid artery.

The guidewire, and the support catheter were removed. Good
aspiration obtained from the hub of the [REDACTED] guide sheath. A control
arteriogram performed through this demonstrates the right external
carotid artery and its major branches to be widely patent.

The right internal carotid artery at the bulb to the cranial skull
base also demonstrates wide patency.

Over a 0.035 inch Roadrunner guidewire, a 5 French 115 cm Navien
guide catheter was then advanced to the distal right cervical ICA.
The guidewire was removed. Good aspiration obtained from the hub of
the Navien guide catheter. Control arteriogram was perfor[REDACTED]ed intracranially.

At this time, the [REDACTED] sheath was advanced to the distal right
internal carotid artery.
FINDINGS: An arteriogram performed intracranially through the Navien guide
catheter in the mid cervical right ICA again demonstrates the
previously positioned stent across the cervical petrous junction to
be widely patent associated with a prominent superiorly riding wide
neck pseudo aneurysm.

More distally the petrous, the cavernous and the supraclinoid
segments demonstrate wide patency.

A right posterior communicating artery is seen opacifying the right
posterior cerebral artery distribution.

The right middle cerebral artery and the right anterior cerebral
artery opacify into the capillary and venous phases.

Measurements were then performed of the distal landing zone
diameter, and the proximal landing zone diameter of the right
internal carotid artery.

It was decided to proceed with placement of a 4 x 16 mm pipeline
shield flow diverter device.

Over a 0.014 inch standard Synchro micro guidewire, an 027 Phenom
microcatheter was advanced to the distal end of the Navien guide
catheter.

With the micro guidewire leading with a J-tip configuration, and
using a torque device, access was obtained into the right middle
cerebral artery proximally with the micro guidewire followed by the
microcatheter. The guidewire was removed. Good aspiration obtained
from the hub of the microcatheter. Gentle contrast injection through
the microcatheter demonstrated safe position of the tip of the
microcatheter.

The 4 mm x 60 mm pipeline device was then advanced to the distal end
of the microcatheter.

The distal wire was then deployed by unsheathing the microcatheter
including the distal portion of the device. Once fully opened, the
combination of the wire and the flow diverter device were then
retrieved proximally into the distal landing zone. Once there, with
slight forward traction with the right hand on the delivery micro
guidewire, the device was then slowly deployed ensuring apposition
with the previously positioned device. Following complete deployment
of the device, the microcatheter was then advanced through the flow
diverter into the proximal petrous segment. The micro guidewire was
removed. A control arteriogram performed through the Navien guide
catheter in the mid right internal carotid artery now demonstrated
excellent apposition of the flow diverter in the distal portion of
the previously positioned stent, but proximal to the previous device
the more normal right internal carotid artery.

Also noted was stasis in the pseudo aneurysm.

Under constant fluoroscopic observation, the microcatheter was then
gently retrieved without any displacement of the devices. Follow-up
arteriograms were then performed at 15 and 30 minutes post
deployment of the flow diverter. These continued to demonstrate
excellent apposition of the device with no evidence of intraluminal
filling defects or of vessel occlusions.

The 5 French Navien guide catheter was removed. A 7 French sheath
was retrieved into the right common carotid artery. A control
arteriogram performed through the [REDACTED] sheath demonstrated patency
of the right internal carotid artery intracranially and
extracranially with no evidence of filling defects or of occlusions
in the anterior circulation.

The 7 French sheath was removed. The radial sheath was removed.
Hemostasis was obtained with a wrist band. Distal right radial pulse
was verified to be present.

Patient was extubated. Upon recovery, the patient demonstrated no
neurological symptoms or signs. She denied any nausea, vomiting,
headaches, visual symptoms or of motor deficits.

She was then transferred to PACU and then neuro ICU.

Overnight the patient was maintained on IV heparin, with monitoring
of her neurological status and her blood pressure.

The following morning, the IV heparin was stopped and the patient
was switched to aspirin 81 mg a day, and Brilinta 90 mg a day. She
was able to eat without difficulty. Patient ambulated independently.

She was then discharged home under the care of her husband.

The patient was advised to refrain from stooping, bending or lifting
weights above 10 pounds for 2 weeks. She was advised to refrain from
driving also. She was instructed to maintain hydration by drinking
water.

Patient and spouse were instructed should she develop any
stroke-like symptoms, to call 911.

They expressed understanding and agreement with the above management
plan.
IMPRESSION: Status post endovascular treatment of pseudo aneurysm of the right
internal carotid artery proximal horizontal petrous segment with
placement of a pipeline shield flow diverter device with stasis.

PLAN:
Follow-up in the clinic 2 weeks post discharge.

## 2021-02-05 SURGERY — MRI WITH ANESTHESIA
Anesthesia: General

## 2021-02-05 MED ORDER — ASPIRIN 81 MG PO CHEW
CHEWABLE_TABLET | ORAL | Status: AC
  Filled 2021-02-05: qty 3

## 2021-02-05 MED ORDER — HEPARIN SODIUM (PORCINE) 1000 UNIT/ML IJ SOLN
INTRAMUSCULAR | Status: DC | PRN
  Administered 2021-02-05: 1000 [IU] via INTRAVENOUS

## 2021-02-05 MED ORDER — PROPOFOL 10 MG/ML IV BOLUS
INTRAVENOUS | Status: DC | PRN
  Administered 2021-02-05: 130 mg via INTRAVENOUS
  Administered 2021-02-05: 30 mg via INTRAVENOUS

## 2021-02-05 MED ORDER — VERAPAMIL HCL 2.5 MG/ML IV SOLN
INTRAVENOUS | Status: AC
  Filled 2021-02-05: qty 2

## 2021-02-05 MED ORDER — NITROGLYCERIN 1 MG/10 ML FOR IR/CATH LAB
INTRA_ARTERIAL | Status: AC | PRN
  Administered 2021-02-05 (×2): 200 ug via INTRA_ARTERIAL

## 2021-02-05 MED ORDER — ONDANSETRON HCL 4 MG/2ML IJ SOLN
INTRAMUSCULAR | Status: DC | PRN
  Administered 2021-02-05: 4 mg via INTRAVENOUS

## 2021-02-05 MED ORDER — NITROGLYCERIN 1 MG/10 ML FOR IR/CATH LAB
INTRA_ARTERIAL | Status: AC
  Filled 2021-02-05: qty 10

## 2021-02-05 MED ORDER — ACETAMINOPHEN 160 MG/5ML PO SOLN: 650.0000 mg | ORAL | Status: DC | PRN

## 2021-02-05 MED ORDER — CHLORHEXIDINE GLUCONATE CLOTH 2 % EX PADS
6.0000 | MEDICATED_PAD | Freq: Every day | CUTANEOUS | Status: DC
  Administered 2021-02-05 – 2021-02-06 (×2): 6 via TOPICAL

## 2021-02-05 MED ORDER — PHENYLEPHRINE HCL-NACL 10-0.9 MG/250ML-% IV SOLN
INTRAVENOUS | Status: DC | PRN
  Administered 2021-02-05: 15 ug/min via INTRAVENOUS

## 2021-02-05 MED ORDER — NIMODIPINE 30 MG PO CAPS
0.0000 mg | ORAL_CAPSULE | ORAL | Status: DC
Start: 2021-02-05 — End: 2021-02-05

## 2021-02-05 MED ORDER — ACETAMINOPHEN 325 MG PO TABS
650.0000 mg | ORAL_TABLET | ORAL | Status: DC | PRN
Start: 2021-02-05 — End: 2021-02-06

## 2021-02-05 MED ORDER — HEPARIN (PORCINE) 25000 UT/250ML-% IV SOLN
500.0000 [IU]/h | INTRAVENOUS | Status: DC
  Administered 2021-02-05: 500 [IU]/h via INTRAVENOUS
  Filled 2021-02-05: qty 250

## 2021-02-05 MED ORDER — CHLORHEXIDINE GLUCONATE 0.12 % MT SOLN
15.0000 mL | Freq: Once | OROMUCOSAL | Status: AC
  Administered 2021-02-05: 15 mL via OROMUCOSAL
  Filled 2021-02-05: qty 15

## 2021-02-05 MED ORDER — HEPARIN SODIUM (PORCINE) 1000 UNIT/ML IJ SOLN
INTRAMUSCULAR | Status: AC | PRN
  Administered 2021-02-05: 2000 [IU] via INTRA_ARTERIAL

## 2021-02-05 MED ORDER — ACETAMINOPHEN 650 MG RE SUPP: 650.0000 mg | RECTAL | Status: DC | PRN

## 2021-02-05 MED ORDER — FENTANYL CITRATE (PF) 100 MCG/2ML IJ SOLN: 25.0000 ug | INTRAMUSCULAR | Status: DC | PRN

## 2021-02-05 MED ORDER — SODIUM CHLORIDE 0.9 % IV SOLN: INTRAVENOUS | Status: DC

## 2021-02-05 MED ORDER — HEPARIN SODIUM (PORCINE) 1000 UNIT/ML IJ SOLN
INTRAMUSCULAR | Status: AC
  Filled 2021-02-05: qty 1

## 2021-02-05 MED ORDER — VERAPAMIL HCL 2.5 MG/ML IV SOLN
INTRAVENOUS | Status: AC | PRN
  Administered 2021-02-05: 2.5 mg via INTRAVENOUS

## 2021-02-05 MED ORDER — TICAGRELOR 90 MG PO TABS
90.0000 mg | ORAL_TABLET | Freq: Two times a day (BID) | ORAL | Status: DC
  Administered 2021-02-05 – 2021-02-06 (×2): 90 mg via ORAL
  Filled 2021-02-05 (×2): qty 1

## 2021-02-05 MED ORDER — ORAL CARE MOUTH RINSE: 15.0000 mL | Freq: Once | OROMUCOSAL | Status: AC

## 2021-02-05 MED ORDER — IOHEXOL 300 MG/ML  SOLN
150.0000 mL | Freq: Once | INTRAMUSCULAR | Status: AC | PRN
  Administered 2021-02-05: 60 mL via INTRA_ARTERIAL

## 2021-02-05 MED ORDER — LIDOCAINE 2% (20 MG/ML) 5 ML SYRINGE
INTRAMUSCULAR | Status: DC | PRN
  Administered 2021-02-05: 80 mg via INTRAVENOUS

## 2021-02-05 MED ORDER — ASPIRIN 81 MG PO CHEW: 81.0000 mg | CHEWABLE_TABLET | Freq: Every day | ORAL | Status: DC

## 2021-02-05 MED ORDER — PHENYLEPHRINE 40 MCG/ML (10ML) SYRINGE FOR IV PUSH (FOR BLOOD PRESSURE SUPPORT)
PREFILLED_SYRINGE | INTRAVENOUS | Status: DC | PRN
  Administered 2021-02-05 (×2): 80 ug via INTRAVENOUS

## 2021-02-05 MED ORDER — DOXYLAMINE SUCCINATE (SLEEP) 25 MG PO TABS
25.0000 mg | ORAL_TABLET | Freq: Every evening | ORAL | Status: DC | PRN
  Administered 2021-02-05: 25 mg via ORAL
  Filled 2021-02-05 (×2): qty 1

## 2021-02-05 MED ORDER — ASPIRIN 81 MG PO CHEW
243.0000 mg | CHEWABLE_TABLET | Freq: Once | ORAL | Status: AC
  Administered 2021-02-05: 243 mg via ORAL

## 2021-02-05 MED ORDER — PROMETHAZINE HCL 25 MG/ML IJ SOLN: 6.2500 mg | INTRAMUSCULAR | Status: DC | PRN

## 2021-02-05 MED ORDER — SUGAMMADEX SODIUM 200 MG/2ML IV SOLN
INTRAVENOUS | Status: DC | PRN
  Administered 2021-02-05: 200 mg via INTRAVENOUS

## 2021-02-05 MED ORDER — ROCURONIUM BROMIDE 10 MG/ML (PF) SYRINGE
PREFILLED_SYRINGE | INTRAVENOUS | Status: DC | PRN
  Administered 2021-02-05: 20 mg via INTRAVENOUS
  Administered 2021-02-05: 60 mg via INTRAVENOUS

## 2021-02-05 MED ORDER — ACETAMINOPHEN 500 MG PO TABS
1000.0000 mg | ORAL_TABLET | Freq: Once | ORAL | Status: AC
  Administered 2021-02-05: 1000 mg via ORAL
  Filled 2021-02-05: qty 2

## 2021-02-05 MED ORDER — HEPARIN (PORCINE) 25000 UT/250ML-% IV SOLN
600.0000 [IU]/h | INTRAVENOUS | Status: DC
  Administered 2021-02-05: 600 [IU]/h via INTRAVENOUS
  Filled 2021-02-05: qty 250

## 2021-02-05 MED ORDER — CEFAZOLIN SODIUM-DEXTROSE 2-4 GM/100ML-% IV SOLN
2.0000 g | INTRAVENOUS | Status: AC
  Administered 2021-02-05: 2 g via INTRAVENOUS
  Filled 2021-02-05: qty 100

## 2021-02-05 MED ORDER — DEXAMETHASONE SODIUM PHOSPHATE 10 MG/ML IJ SOLN
INTRAMUSCULAR | Status: DC | PRN
  Administered 2021-02-05: 4 mg via INTRAVENOUS

## 2021-02-05 MED ORDER — ASPIRIN 81 MG PO CHEW
81.0000 mg | CHEWABLE_TABLET | Freq: Every day | ORAL | Status: DC
  Administered 2021-02-06: 81 mg via ORAL
  Filled 2021-02-05: qty 1

## 2021-02-05 MED ORDER — CLEVIDIPINE BUTYRATE 0.5 MG/ML IV EMUL: 0.0000 mg/h | INTRAVENOUS | Status: DC

## 2021-02-05 MED ORDER — TICAGRELOR 90 MG PO TABS: 90.0000 mg | ORAL_TABLET | Freq: Two times a day (BID) | ORAL | Status: DC

## 2021-02-05 MED ORDER — ASPIRIN EC 325 MG PO TBEC: 325.0000 mg | DELAYED_RELEASE_TABLET | ORAL | Status: DC

## 2021-02-05 MED ORDER — FENTANYL CITRATE (PF) 100 MCG/2ML IJ SOLN
INTRAMUSCULAR | Status: DC | PRN
  Administered 2021-02-05: 75 ug via INTRAVENOUS

## 2021-02-05 MED ORDER — LIDOCAINE HCL 1 % IJ SOLN
INTRAMUSCULAR | Status: AC
  Filled 2021-02-05: qty 20

## 2021-02-05 NOTE — Anesthesia Procedure Notes (Signed)
Procedure Name: Intubation Date/Time: 02/05/2021 9:13 AM Performed by: Lowella Dell, CRNA Pre-anesthesia Checklist: Patient identified, Emergency Drugs available, Suction available and Patient being monitored Patient Re-evaluated:Patient Re-evaluated prior to induction Oxygen Delivery Method: Circle System Utilized Preoxygenation: Pre-oxygenation with 100% oxygen Induction Type: IV induction Ventilation: Mask ventilation without difficulty Laryngoscope Size: Mac and 3 Grade View: Grade I Tube type: Oral Tube size: 7.0 mm Number of attempts: 1 Airway Equipment and Method: Stylet Placement Confirmation: ETT inserted through vocal cords under direct vision,  positive ETCO2 and breath sounds checked- equal and bilateral Secured at: 23 cm Tube secured with: Tape Dental Injury: Teeth and Oropharynx as per pre-operative assessment

## 2021-02-05 NOTE — Progress Notes (Signed)
Pt asking for sleeping pill she uses at home, Unisom.  Dr. Estanislado Pandy notified of request.  Pharmacy contacted to check for drug interactions before administration.  Will continue to monitor.

## 2021-02-05 NOTE — Progress Notes (Signed)
  Patient seen on post procedure rounds.  Alert, awake, and oriented x3. Speech and comprehension intact. EOMs intact bilaterally without nystagmus or subjective diplopia. Visual fields intact bilaterally. No facial asymmetry. Tongue midline. Motor power symmetric proportional to effort. No pronator drift. Fine motor and coordination intact and symmetric. 5/5 Strength bilaterally Radial artery puncture site still with band in place. Gait not assessed. Romberg not assessed. Heel to toe not assessed.  Hopefully D/C tomorrow if she continues to do well.   Keimari Onslow S Honestee Revard PA-C 02/05/2021 3:27 PM

## 2021-02-05 NOTE — Transfer of Care (Signed)
Immediate Anesthesia Transfer of Care Note  Patient: Dana Henry  Procedure(s) Performed: MRI WITH ANESTHESIA EMBOLIZATION (N/A )  Patient Location: PACU  Anesthesia Type:General  Level of Consciousness: awake, alert  and oriented  Airway & Oxygen Therapy: Patient Spontanous Breathing and Patient connected to nasal cannula oxygen  Post-op Assessment: Report given to RN, Post -op Vital signs reviewed and stable and Patient moving all extremities X 4  Post vital signs: Reviewed and stable  Last Vitals:  Vitals Value Taken Time  BP 107/58 02/05/21 1148  Temp    Pulse 77 02/05/21 1149  Resp 13 02/05/21 1149  SpO2 100 % 02/05/21 1149  Vitals shown include unvalidated device data.  Last Pain:  Vitals:   02/05/21 0740  PainSc: 0-No pain         Complications: No complications documented.

## 2021-02-05 NOTE — Anesthesia Postprocedure Evaluation (Signed)
Anesthesia Post Note  Patient: Dana Henry  Procedure(s) Performed: MRI WITH ANESTHESIA EMBOLIZATION (N/A )     Patient location during evaluation: PACU Anesthesia Type: General Level of consciousness: awake and alert, awake and oriented Pain management: pain level controlled Vital Signs Assessment: post-procedure vital signs reviewed and stable Respiratory status: spontaneous breathing, nonlabored ventilation and respiratory function stable Cardiovascular status: blood pressure returned to baseline and stable Postop Assessment: no apparent nausea or vomiting Anesthetic complications: no   No complications documented.  Last Vitals:  Vitals:   02/05/21 1330 02/05/21 1400  BP:  118/64  Pulse: 68 77  Resp: 12 17  Temp:    SpO2: 99% 99%    Last Pain:  Vitals:   02/05/21 1300  TempSrc: Oral  PainSc:                  Catalina Gravel

## 2021-02-05 NOTE — Sedation Documentation (Signed)
Pt transported to [facility] via stretcher accompanied by RN and CRNA. Report given to PACU RN at bedside. Handoff completed. Right wrist level 0 with TR band in place. Pt awake and alert, responds appropriately. No s/s of distress at this time.

## 2021-02-05 NOTE — H&P (Signed)
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 female   Known to NIR: High-grade severe pre occlusive stenosis of the right internal carotid artery petrous segment on CT angiogram of the head and neck. 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  R ICA stenosis revascularization and R MCA TICI 3 revascularization 11/13/20  Recheck arteriogram 01/16/21: IMPRESSION: Wide patency of the previously treated severe stenosis at the cervical petrous junction of right internal carotid artery with stent assisted angioplasty. 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 A 3.6 mm x 2.4 mm outpouching in the left posterior communicating artery region. Differential is that of an aneurysm versus an infundibulum.  Pt is asymptomatic Denies N/V/Fever Denies headache; dizzy Denies speech or vision changes Taking Brilinta and ASA 81 mg daily Nonsmoker; no alcohol  Consult 01/20/21: The presence of a 6.6 mm x 3 mm outpouching at the cervical petrous junction of the right internal carotid artery stented segment was reviewed.  Options discussed were those of continued surveillance with CT angiogram of the head and neck or MRA to look for instability in terms of change in shape and or size of the pseudoaneurysm. Stasis within the aneurysm, there continued to be a risk of formation of micro thrombi in the pseudoaneurysm with distal embolization in the right cerebral hemisphere. Their option was to reconstruct the  site of the pseudoaneurysm formation with the placement of a flow diverter. The procedure was again reviewed. This would entail, under gentle anesthesia, placement of the flow diverter at the site of the pseudoaneurysm. Risk of 1% to 2% chance of an ischemic event with a remote delayed risk of intracranial hemorrhage were discussed. Both the patient and also her mother's questions were answered to their Satisfaction. They decide to move forward with Intervention  Scheduled today for embolization of pseudoaneurysm RICA with Dr Estanislado Pandy   Past Medical History:  Diagnosis Date  . Anemia   . CVA (cerebral vascular accident) ([place])   . History of cesarean delivery affecting pregnancy 08/10/2017  . History of kidney stones    surgically removed  . Migraines    associated with menstrual cycle  . Pneumonia   . UTI (urinary tract infection)   . Vaginal Pap smear, abnormal     Past Surgical History:  Procedure Laterality Date  . BUBBLE STUDY  11/18/2020   Procedure: BUBBLE STUDY;  Surgeon: Elouise Munroe, MD;  Location: [place];  Service: Cardiology;;  . CESAREAN SECTION N/A 07/21/2014   Procedure: CESAREAN SECTION;  Surgeon: Elveria Royals, MD;  Location: [facility];  Service: Obstetrics;  Laterality: N/A;  . CESAREAN SECTION N/A 08/10/2017   Procedure: Repeat CESAREAN SECTION;  Surgeon: Azucena Fallen, MD;  Location: [place];  Service: Obstetrics;  Laterality: N/A;  EDD: 08/17/17 Allergy: Sulfa  . IR ANGIO INTRA EXTRACRAN SEL COM CAROTID INNOMINATE BILAT MOD SED  01/16/2021  . IR CT HEAD LTD  11/13/2020  . IR INTRA CRAN STENT  11/13/2020  . IR PERCUTANEOUS ART THROMBECTOMY/INFUSION INTRACRANIAL INC DIAG ANGIO  11/13/2020  . IR RADIOLOGIST EVAL & MGMT  01/21/2021  . KIDNEY STONE SURGERY    . RADIOLOGY WITH ANESTHESIA N/A 11/13/2020   Procedure: IR WITH ANESTHESIA;  Surgeon: Radiologist, Medication, MD;  Location: [facility] [place];  Service: Radiology;  Laterality: N/A;  . TEE WITHOUT  CARDIOVERSION N/A 11/18/2020   Procedure: TRANSESOPHAGEAL ECHOCARDIOGRAM (TEE);  Surgeon: Elouise Munroe, MD;  Location: [place];  Service: Cardiology;  Laterality: N/A;  . TONSILLECTOMY AND ADENOIDECTOMY  1991    Allergies: Sulfa antibiotics  Medications: Prior to Admission medications   Medication Sig Start Date End Date Taking? Authorizing Provider  acetaminophen (TYLENOL) 500 MG tablet Take 1,000 mg by mouth every 6 (six) hours as needed for moderate pain.   Yes [provider]  aspirin 81 MG chewable tablet Chew 1 tablet (81 mg total) by mouth daily. 11/21/20  Yes Angiulli, Lavon Paganini, PA-C  atorvastatin (LIPITOR) 40 MG tablet Take 1 tablet (40 mg total) by mouth daily. 12/23/20  Yes Copland, Gay Filler, MD  FLUoxetine (PROZAC) 20 MG tablet Take 1 tablet (20 mg total) by mouth daily. Increase to 40 mg after 2 weeks Patient taking differently: Take 20 mg by mouth daily. 11/26/20  Yes Copland, Gay Filler, MD  Multiple Vitamins-Minerals (MULTIVITAMIN WITH MINERALS) tablet Take 1 tablet by mouth daily.   Yes [provider]  pantoprazole (PROTONIX) 40 MG tablet Take 1 tablet (40 mg total) by mouth daily. 12/23/20  Yes Copland, Gay Filler, MD  polyethylene glycol (MIRALAX / GLYCOLAX) 17 g packet Take 17 g by mouth 2 (two) times daily. 11/21/20  Yes Angiulli, Lavon Paganini, PA-C  ticagrelor (BRILINTA) 90 MG TABS tablet Take 1 tablet (90 mg total) by mouth 2 (two) times daily. 02/04/21  Yes Garvin Fila, MD     Family History  Problem Relation Age of Onset  . Hypertension Mother   . Alcohol abuse Father     Social History   Socioeconomic History  . Marital status: Married    Spouse name: Harmonii Karle  . Number of children: 2  . Years of education: College  . Highest education level: Not on file  Occupational History  . Occupation: Press photographer  Tobacco Use  . Smoking status: Former Smoker    Packs/day: 0.50    Types: Cigarettes    Quit date: 10/19/2013    Years since quitting:  7.3  . Smokeless tobacco: Never Used  Vaping Use  . Vaping Use: Never used  Substance and Sexual Activity  . Alcohol use: Yes    Comment: occassional  . Drug use: No  . Sexual activity: Yes    Birth control/protection: None  Other Topics Concern  . Not on file  Social History Narrative   Lives in [place] with husband and mother and father and mother in [employer]. Daughter 26months, Huel Coventry.      Right-handed.      Work - Manufacturing systems engineer in [employer]      Diet - regular   Exercise - occasional walks      Caffinated Beverages: Yes - 2 cups per day   Herbal Remedies: No   Seat Belts: Yes   Bike Helmet: Yes   Exercise 3 Times a Week: No   Vegetarian: No   Eat Dairy Products:   Take Vitamins: Yes   Use Hearing Aid: No   Wear Dentures: No   Smoke Alarms in Home: Yes   Guns/Firearms: No   Physical Abuse: No      Hours of Sleep: 6-7   # of People  in Home: 3               Social Determinants of Health   Financial Resource Strain: Not on file  Food Insecurity: Not on file  Transportation Needs: Not on file  Physical Activity: Not on file  Stress: Not on file  Social Connections: Not on file    Review of Systems: A 12 point ROS discussed and pertinent positives are indicated in the HPI above.  All other systems are negative.  Review of Systems  Constitutional: Negative for activity change, fatigue and fever.  HENT: Negative for tinnitus and trouble swallowing.   Eyes: Negative for visual disturbance.  Respiratory: Negative for cough and shortness of breath.   Cardiovascular: Negative for chest pain.  Gastrointestinal: Negative for abdominal pain, nausea and vomiting.  Musculoskeletal: Negative for gait problem.  Neurological: Negative for dizziness, tremors, seizures, syncope, facial asymmetry, speech difficulty, weakness, light-headedness, numbness and headaches.  Psychiatric/Behavioral: Negative for behavioral problems and confusion.    Vital Signs: BP (!)  109/55   Pulse 77   Temp 98.3 F (36.8 C)   Resp 18   Ht 5\' 4"  (1.626 m)   Wt 171 lb 15.3 oz (78 kg)   LMP 01/16/2021 (Approximate)   SpO2 100%   BMI 29.52 kg/m   Physical Exam Vitals reviewed.  HENT:     Mouth/Throat:     Mouth: Mucous membranes are moist.  Eyes:     Extraocular Movements: Extraocular movements intact.  Cardiovascular:     Rate and Rhythm: Normal rate and regular rhythm.     Heart sounds: Normal heart sounds.  Pulmonary:     Effort: Pulmonary effort is normal.     Breath sounds: Normal breath sounds.  Abdominal:     Palpations: Abdomen is soft.     Tenderness: There is no abdominal tenderness.  Musculoskeletal:        General: No swelling or tenderness. Normal range of motion.     Right lower leg: No edema.     Left lower leg: No edema.  Skin:    General: Skin is warm.  Neurological:     Mental Status: She is alert and oriented to person, place, and time.  Psychiatric:        Behavior: Behavior normal.        Judgment: Judgment normal.     Imaging: IR Radiologist Eval & Mgmt  Result Date: 01/21/2021 EXAM: ESTABLISHED PATIENT OFFICE VISIT CHIEF COMPLAINT: Discuss the findings of a recent diagnostic catheter arteriogram of the head and neck. Current Pain Level: 1-10 HISTORY OF PRESENT ILLNESS: Patient is a 40 year old right handed lady who presents accompanied by her mother to discuss the findings on the recent diagnostic catheter arteriogram of 01/16/2021. The presence of a 6.6 mm x 3 mm outpouching at the cervical petrous junction of the right internal carotid artery stented segment was reviewed. Options discussed were those of continued surveillance with CT angiogram of the head and neck or MRA to look for instability in terms of change in shape and or size of the pseudoaneurysm. Stasis within the aneurysm, there continued to be a risk of formation of micro thrombi in the pseudoaneurysm with distal embolization in the right cerebral hemisphere. Their  option was to reconstruct the site of the pseudoaneurysm formation with the placement of a flow diverter. The procedure was again reviewed. This would entail, under gentle anesthesia, placement of the flow diverter at the site of the pseudoaneurysm. Risk of 1% to  2% chance of an ischemic event with a remote delayed risk of intracranial hemorrhage were discussed. Both the patient and also her mother's questions were answered to their satisfaction. Patient has decided and the mother has agreed to proceed with endovascular treatment of the pseudoaneurysm. This will be scheduled at the earliest possible. Patient was advised to continue with her dual antiplatelets and to maintain adequate hydration. Past Medical History: Unchanged Medications: Unchanged Allergies: Sulfa antibiotics causes [name] syndrome. Social History: As per recent H and P unchanged Family History:  As per recent H and P unchanged REVIEW OF SYSTEMS: Non contributory. PHYSICAL EXAMINATION: Grossly intact neurologically. ASSESSMENT AND PLAN: See above. The patient and the mother were advised to call for any concerns or questions. They both leave with good understanding and agreement with the above management plan. Electronically Signed   By: Luanne Bras M.D.   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 EXAM: BILATERAL COMMON CAROTID AND INNOMINATE ANGIOGRAPHY COMPARISON:  Arteriogram of November 13, 2020. MEDICATIONS: Heparin 1000 units IV. No antibiotic was administered within 1 hour of the procedure. ANESTHESIA/SEDATION: Versed 1 mg IV; Fentanyl 25 mcg IV Moderate Sedation Time:  34 minutes The patient was continuously monitored during the procedure  by the interventional radiology nurse under my direct supervision. CONTRAST:  Isovue 300 approximately 15 mL. FLUOROSCOPY TIME:  Fluoroscopy Time: 3 minutes 48 seconds (405 mGy). COMPLICATIONS: None immediate. TECHNIQUE: Informed written consent was obtained from the patient after a thorough discussion of the procedural risks, benefits and alternatives. All questions were addressed. Maximal Sterile Barrier Technique was utilized including caps, mask, sterile gowns, sterile gloves, sterile drape, hand hygiene and skin antiseptic. A timeout was performed prior to the initiation of the procedure. The right groin was prepped and draped in the usual sterile fashion. Thereafter using modified Seldinger technique, transfemoral access into the right common femoral artery was obtained without difficulty. Over a 0.035 inch guidewire, a 5 French Pinnacle sheath was inserted. Through this, and also over 0.035 inch guidewire, a 5 French Simmons 2 diagnostic catheter was advanced to the aortic arch region and selectively positioned in the right common carotid artery, and the left common carotid artery. FINDINGS: The right common carotid arteriogram demonstrates the right external carotid artery and its major branches to be widely patent. The right internal carotid artery at the bulb to the cranial skull base is widely patent. Patency is seen at the site of the previous stent assisted angioplasty. There are mild FMD like changes just distal to this. Mild fusiform dilatation is seen at the petrous cavernous junction. Seen arising at the proximal horizontal petrous segment of the right internal carotid artery is a saccular outpouching measuring approximately 6 mm x 3.3 mm at the superior aspect of the vessel. More distally the petrous, the cavernous and the supraclinoid segments are widely patent. The right posterior communicating artery is seen opacifying the right posterior cerebral distribution. The right middle cerebral artery  and the right anterior cerebral artery opacify into the capillary and venous phases. Left common carotid arteriogram demonstrates mild stenosis at the origin of the left external carotid artery. Its branches opacify normally. The left internal carotid artery at the bulb to the cranial skull base is widely patent. The petrous,  cavernous and supraclinoid segments are widely patent. Arising in the right posterior communicating artery region is an approximately 3.6 mm x 2.4 mm outpouching with the left posterior communicating artery emanating from it. The left middle cerebral artery and the left anterior cerebral artery opacify into the capillary and venous phases. IMPRESSION: Wide patency of the previously treated severe stenosis at the cervical petrous junction of right internal carotid artery with stent assisted angioplasty. 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 A 3.6 mm x 2.4 mm outpouching in the left posterior communicating artery region. Differential is that of an aneurysm versus an infundibulum. PLAN: Findings reviewed with the patient. Patient to return for consultation regarding the management of the angiographic findings. Electronically Signed   By: Luanne Bras M.D.   On: 01/16/2021 15:25    Labs:  CBC: Recent Labs    11/18/20 0154 11/19/20 0500 01/16/21 0700 02/05/21 0715  WBC 6.5 6.4 6.8 6.1  HGB 11.3* 11.8* 12.8 13.2  HCT 31.7* 34.9* 38.3 39.9  PLT 216 229 249 231    COAGS: Recent Labs    11/13/20 1023 01/16/21 0700 02/05/21 0715  INR 1.1 1.1 1.0  APTT 28  --   --     BMP: Recent Labs    11/17/20 0112 11/18/20 0154 11/19/20 0500 01/16/21 0700  NA 137 136 138 138  K 3.4* 3.3* 3.6 3.7  CL 106 106 106 108  CO2 20* 20* 21* 24  GLUCOSE 101* 95 94 91  BUN 9 10 13 15   CALCIUM 8.7* 9.0 9.1 9.3  CREATININE 0.82 0.81 0.86 0.98  GFRNONAA >60 >60 >60 >60    LIVER  FUNCTION TESTS: Recent Labs    03/21/20 1050 11/13/20 1023 11/19/20 0500  BILITOT 0.5 1.3* 0.9  AST 17 27 28   ALT 18 37 34  ALKPHOS 32* 33* 30*  PROT 6.7 7.4 6.3*  ALBUMIN 4.5 4.3 3.5    TUMOR MARKERS: No results for input(s): AFPTM, CEA, CA199, CHROMGRNA in the last 8760 hours.  Assessment and Plan:  Scheduled today for cerebral arteriogram with possible embolization of Right internal carotid artery pseudoaneurysm. Risks and benefits of cerebral angiogram with intervention were discussed with the patient including, but not limited to bleeding, infection, vascular injury, contrast induced renal failure, stroke or even death.  This interventional procedure involves the use of X-rays and because of the nature of the planned procedure, it is possible that we will have prolonged use of X-ray fluoroscopy.  Potential radiation risks to you include (but are not limited to) the following: - A slightly elevated risk for cancer  several years later in life. This risk is typically less than 0.5% percent. This risk is low in comparison to the normal incidence of human cancer, which is 33% for women and 50% for men according to the [place]. - Radiation induced injury can include skin redness, resembling a rash, tissue breakdown / ulcers and hair loss (which can be temporary or permanent).   The likelihood of either of these occurring depends on the difficulty of the procedure and whether you are sensitive to radiation due to previous procedures, disease, or genetic conditions.   IF your procedure requires a prolonged use of radiation, you will be notified and given written instructions for further action.  It is your responsibility to monitor the irradiated area for the 2 weeks following the procedure and to  notify your physician if you are concerned that you have suffered a radiation induced injury.    All of the patient's questions were answered, patient is agreeable to  proceed.  Consent signed and in chart.  Pt is aware if intervention is performed- she will be admitted to Neuro ICU overnight for observation. To be discharged nest am She is agreeable to proceed  Thank you for this interesting consult.  I greatly enjoyed meeting Dana Henry and look forward to participating in their care.  A copy of this report was sent to the requesting provider on this date.  Electronically Signed: Lavonia Drafts, PA-C 02/05/2021, 8:20 AM   I spent a total of    25 Minutes in face to face in clinical consultation, greater than 50% of which was counseling/coordinating care for R ICA pseudoaneuryam embolization

## 2021-02-05 NOTE — Progress Notes (Signed)
ANTICOAGULATION CONSULT NOTE - Initial Consult  Pharmacy Consult for heparin Indication: post neuro-IR intervention  Allergies  Allergen Reactions  . Sulfa Antibiotics Hives and Swelling    Patient Measurements: Height: 5\' 4"  (162.6 cm) Weight: 78 kg (171 lb 15.3 oz) IBW/kg (Calculated) : 54.7 Heparin Dosing Weight: 71  Vital Signs: Temp: 97.4 F (36.3 C) (04/20 1219) BP: 108/66 (04/20 1219) Pulse Rate: 79 (04/20 1219)  Labs: Recent Labs    02/05/21 0715  HGB 13.2  HCT 39.9  PLT 231  LABPROT 13.4  INR 1.0  CREATININE 0.94    Estimated Creatinine Clearance: 80.4 mL/min (by C-G formula based on SCr of 0.94 mg/dL).   Medical History: Past Medical History:  Diagnosis Date  . Anemia   . CVA (cerebral vascular accident) ([place])   . History of cesarean delivery affecting pregnancy 08/10/2017  . History of kidney stones    surgically removed  . Migraines    associated with menstrual cycle  . Pneumonia   . UTI (urinary tract infection)   . Vaginal Pap smear, abnormal     Medications:  Medications Prior to Admission  Medication Sig Dispense Refill Last Dose  . acetaminophen (TYLENOL) 500 MG tablet Take 1,000 mg by mouth every 6 (six) hours as needed for moderate pain.   Past Week at Unknown time  . aspirin 81 MG chewable tablet Chew 1 tablet (81 mg total) by mouth daily.   02/05/2021 at 0600  . atorvastatin (LIPITOR) 40 MG tablet Take 1 tablet (40 mg total) by mouth daily. 90 tablet 3 02/05/2021 at 0600  . FLUoxetine (PROZAC) 20 MG tablet Take 1 tablet (20 mg total) by mouth daily. Increase to 40 mg after 2 weeks (Patient taking differently: Take 20 mg by mouth daily.) 60 tablet 5 02/05/2021 at 0600  . Multiple Vitamins-Minerals (MULTIVITAMIN WITH MINERALS) tablet Take 1 tablet by mouth daily.   Past Week at Unknown time  . pantoprazole (PROTONIX) 40 MG tablet Take 1 tablet (40 mg total) by mouth daily. 90 tablet 3 02/05/2021 at 0600  . polyethylene glycol (MIRALAX /  GLYCOLAX) 17 g packet Take 17 g by mouth 2 (two) times daily. 14 each 0 Past Week at Unknown time  . ticagrelor (BRILINTA) 90 MG TABS tablet Take 1 tablet (90 mg total) by mouth 2 (two) times daily. 60 tablet 5 02/05/2021 at 0600    Assessment: 53 YOF s/p embolization of RT ICA pseudoaneurysm on low dose IV heparin until 8 AM tomorrow.    H/H and Plt wnl. SCr wnl   Goal of Therapy:  Heparin level 0.1-0.25 units/ml Monitor platelets by anticoagulation protocol: Yes   Plan:  -Start heparin infusion at 600 units/hr -F/u 6 hr HL -IV heparin to stop at 8 AM tomorrow   Albertina Parr, PharmD., BCPS, BCCCP Clinical Pharmacist Please refer to [facility] for unit-specific pharmacist

## 2021-02-05 NOTE — Anesthesia Procedure Notes (Signed)
Arterial Line Insertion Start/End4/20/2022 8:45 AM, 02/05/2021 8:50 AM Performed by: CRNA  Patient location: Pre-op. Preanesthetic checklist: patient identified, IV checked, site marked, risks and benefits discussed, surgical consent, monitors and equipment checked, pre-op evaluation, timeout performed and anesthesia consent Lidocaine 1% used for infiltration Left, radial was placed Catheter size: 20 G Hand hygiene performed  and maximum sterile barriers used   Attempts: 1 Procedure performed without using ultrasound guided technique. Following insertion, dressing applied and Biopatch. Post procedure assessment: normal  Patient tolerated the procedure well with no immediate complications.

## 2021-02-05 NOTE — Progress Notes (Signed)
1300:  Patient arrived to 4NICU from PACU.  Patient is alert and oriented.  TR in placed at right radial site.  Patient educated about TR band.  VSS.  Patient's belongings include clothing and cell phone.

## 2021-02-05 NOTE — Procedures (Signed)
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 Rt Rad approach. .Extubated. Fully awake Denies any H/As,N/V visual or  motor symptoms. Pupils 20mm Rt =Lt Distal RT rad pulse dopplerable. S.Dana Reily MD

## 2021-02-06 ENCOUNTER — Encounter (HOSPITAL_COMMUNITY): Payer: Self-pay | Admitting: Interventional Radiology

## 2021-02-06 DIAGNOSIS — I671 Cerebral aneurysm, nonruptured: Secondary | ICD-10-CM | POA: Diagnosis not present

## 2021-02-06 LAB — CBC WITH DIFFERENTIAL/PLATELET
Abs Immature Granulocytes: 0.03 10*3/uL (ref 0.00–0.07)
Basophils Absolute: 0 10*3/uL (ref 0.0–0.1)
Basophils Relative: 0 %
Eosinophils Absolute: 0 10*3/uL (ref 0.0–0.5)
Eosinophils Relative: 0 %
HCT: 32.3 % — ABNORMAL LOW (ref 36.0–46.0)
Hemoglobin: 10.8 g/dL — ABNORMAL LOW (ref 12.0–15.0)
Immature Granulocytes: 0 %
Lymphocytes Relative: 17 %
Lymphs Abs: 1.4 10*3/uL (ref 0.7–4.0)
MCH: 30.7 pg (ref 26.0–34.0)
MCHC: 33.4 g/dL (ref 30.0–36.0)
MCV: 91.8 fL (ref 80.0–100.0)
Monocytes Absolute: 0.3 10*3/uL (ref 0.1–1.0)
Monocytes Relative: 4 %
Neutro Abs: 6.4 10*3/uL (ref 1.7–7.7)
Neutrophils Relative %: 79 %
Platelets: 202 10*3/uL (ref 150–400)
RBC: 3.52 MIL/uL — ABNORMAL LOW (ref 3.87–5.11)
RDW: 13.2 % (ref 11.5–15.5)
WBC: 8.1 10*3/uL (ref 4.0–10.5)
nRBC: 0 % (ref 0.0–0.2)

## 2021-02-06 LAB — BASIC METABOLIC PANEL
Anion gap: 5 (ref 5–15)
BUN: 9 mg/dL (ref 6–20)
CO2: 19 mmol/L — ABNORMAL LOW (ref 22–32)
Calcium: 8.4 mg/dL — ABNORMAL LOW (ref 8.9–10.3)
Chloride: 114 mmol/L — ABNORMAL HIGH (ref 98–111)
Creatinine, Ser: 0.78 mg/dL (ref 0.44–1.00)
GFR, Estimated: >60 mL/min (ref >60)
Glucose, Bld: 109 mg/dL — ABNORMAL HIGH (ref 70–99)
Potassium: 3.6 mmol/L (ref 3.5–5.1)
Sodium: 138 mmol/L (ref 135–145)

## 2021-02-06 NOTE — Progress Notes (Signed)
ANTICOAGULATION CONSULT NOTE - [place] for Heparin Indication: post neuro-IR intervention  Allergies  Allergen Reactions  . Sulfa Antibiotics Hives and Swelling    Patient Measurements: Height: 5\' 4"  (162.6 cm) Weight: 78 kg (171 lb 15.3 oz) IBW/kg (Calculated) : 54.7 Heparin Dosing Weight: 71  Vital Signs: Temp: 98.4 F (36.9 C) (04/21 0800) Temp Source: Oral (04/21 0800) BP: 103/61 (04/21 0800) Pulse Rate: 61 (04/21 0800)  Labs: Recent Labs    02/05/21 0715 02/05/21 2052 02/06/21 0046  HGB 13.2  --  10.8*  HCT 39.9  --  32.3*  PLT 231  --  202  LABPROT 13.4  --   --   INR 1.0  --   --   HEPARINUNFRC  --  0.16*  --   CREATININE 0.94  --  0.78    Estimated Creatinine Clearance: 94.4 mL/min (by C-G formula based on SCr of 0.78 mg/dL).   Assessment: 37 YOF s/p embolization of RT ICA pseudoaneurysm on low dose IV heparin post IR procedure.   Heparin level yesterday evening was therapeutic of low goal range (HL 0.16, goal of 0.1-0.25). Hgb/Hct but no bleeding noted. Heparin turned off at 0800 this AM for sheath removal.   Goal of Therapy:  Heparin level 0.1-0.25 units/ml Monitor platelets by anticoagulation protocol: Yes   Plan:  - Heparin OFF at 0800 this AM for sheath removal per IR instructions - Pharmacy will sign off protocol  Thank you for allowing pharmacy to be a part of this patient's care.  Alycia Rossetti, PharmD, BCPS Clinical Pharmacist Clinical phone for 02/06/2021: [phone] 02/06/2021 8:45 AM   **Pharmacist phone directory can now be found on [email or web address] ([email or web address]).  Listed under [place].

## 2021-02-06 NOTE — Discharge Instructions (Signed)
Embolization, Care After This sheet gives you information about how to care for yourself after your procedure. Your health care provider may also give you more specific instructions. If you have problems or questions, contact your health care provider. What can I expect after the procedure? After the procedure, it is common to have pain or bruising at the area where your catheter was inserted (insertion site). You may have other symptoms depending on the part of your body that was treated. For example:  Embolization of the arteries in the brain may cause a headache.  Embolization of the arteries in the stomach may cause loss of appetite or nausea. Follow these instructions at home: Insertion site care  If the site starts to bleed, lie down flat and put pressure on the site. If the bleeding does not stop, get help right away. This is a medical emergency.  Follow instructions from your health care provider about how to take care of your insertion site. Make sure you: ? Wash your hands with soap and water before and after you change your bandage (dressing). If soap and water are not available, use hand sanitizer. ? Change your dressing as told by your health care provider. ? Leave stitches (sutures), skin glue, or adhesive strips in place. These skin closures may need to stay in place for 2 weeks or longer. If adhesive strip edges start to loosen and curl up, you may trim the loose edges. Do not remove adhesive strips completely unless your health care provider tells you to do that.  Keep the insertion site clean. To do this: ? Gently wash the site with plain soap and water. ? Pat the area dry with a clean towel. ? Do not rub the site. This may cause bleeding.  Check your insertion site every day for signs of infection. Check for: ? Redness, swelling, or pain. ? Fluid or blood. ? Warmth. ? Pus or a bad smell.  Do not take baths, swim, or use a hot tub until your health care provider  approves. You may be allowed to shower 24-48 hours after the procedure.   Activity  Do not lift anything that is heavier than 10 lb (4.5 kg), or the limit that you are told, until your health care provider says that it is safe.  Do not drive for 24 hours if you were given a sedative during your procedure.  Return to your normal activities as told by your health care provider. Ask your health care provider what activities are safe for you.   General instructions  Take over-the-counter and prescription medicines only as told by your health care provider.  Ask your health care provider if the medicine prescribed to you: ? Requires you to avoid driving or using heavy machinery. ? Can cause constipation. You may need to take actions to prevent or treat constipation, such as:  Drink enough fluid to keep your urine pale yellow.  Take over-the-counter or prescription medicines.  Eat foods that are high in fiber, such as beans, whole grains, and fresh fruits and vegetables.  Limit foods that are high in fat and processed sugars, such as fried or sweet foods.  Keep all follow-up visits as told by your health care provider. This is important.   Contact a health care provider if:  You have pain that gets worse or does not get better with medicine.  You have a fever.  You have redness, swelling, or pain around your insertion site.  You have fluid or  blood coming from your insertion site.  Your insertion site is warm to the touch.  You have pus or a bad smell coming from your insertion site.  You have nausea or vomiting. Get help right away if:  The insertion area swells very quickly.  The insertion area is bleeding, and the bleeding does not stop after you hold steady pressure on the area.  The area near or just beyond the insertion site becomes pale, cool, tingly, or numb.  You faint or feel like you might faint.  You have chest pain.  You have trouble breathing.  You feel  weak or have trouble moving your arms or legs.  You have problems with balance, speech, or vision. These symptoms may represent a serious problem that is an emergency. Do not wait to see if the symptoms will go away. Get medical help right away. Call your local emergency services (911 in the [place] Do not drive yourself to the hospital. Summary  After your procedure, it is common to have pain or bruising at the area where your catheter was inserted (insertion site).  Do not drive for 24 hours if you were given a sedative during your procedure.  Follow instructions from your health care provider about how to take care of your insertion site.  Contact a health care provider if you have a fever or if you have redness, swelling, or pain around your insertion site.  Keep all follow-up visits as told by your health care provider. This is important. This information is not intended to replace advice given to you by your health care provider. Make sure you discuss any questions you have with your health care provider. Document Revised: 02/22/2019 Document Reviewed: 06/06/2018 Elsevier Patient Education  [place].

## 2021-02-06 NOTE — Discharge Summary (Signed)
Patient ID: Dana Henry MRN: [id number] DOB/AGE: Mar 31, 1981 40 y.o.  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   Discharged Condition: good  Hospital Course: Patient was admitted for overnight observation following an elective NIR procedure to treat a right ICA skull base pseudoaneurysm. This procedure was done under general anesthesia and she was extubated following the procedure. She had an uneventful night without any complications. No neuro deficits identified on exam; right radial access site is clean, dry and non-tender. Patient is stable for discharge home once she has eaten breakfast and is ambulating without assistance. She will follow up with Dr. Estanislado Pandy in approximately two weeks. She was encouraged to drink plenty of fluids and to not engage in strenuous activity for 1-2 weeks. No bending, stooping or lifting more than 10 pounds for two weeks. No driving for two weeks. She understands she needs to continue taking Aspirin 81 mg once daily and Brilinta 90 mg twice daily.   Consults: None  Significant Diagnostic Studies: IR Radiologist Eval & Mgmt  Result Date: 01/21/2021 EXAM: ESTABLISHED PATIENT OFFICE VISIT CHIEF COMPLAINT: Discuss the findings of a recent diagnostic catheter arteriogram of the head and neck. Current Pain Level: 1-10 HISTORY OF PRESENT ILLNESS: Patient is a 40 year old right handed lady who presents accompanied by her mother to discuss the findings on the recent diagnostic catheter arteriogram of 01/16/2021. The presence of a 6.6 mm x 3 mm outpouching at the cervical petrous junction of the right internal carotid artery stented segment was reviewed. Options  discussed were those of continued surveillance with CT angiogram of the head and neck or MRA to look for instability in terms of change in shape and or size of the pseudoaneurysm. Stasis within the aneurysm, there continued to be a risk of formation of micro thrombi in the pseudoaneurysm with distal embolization in the right cerebral hemisphere. Their option was to reconstruct the site of the pseudoaneurysm formation with the placement of a flow diverter. The procedure was again reviewed. This would entail, under gentle anesthesia, placement of the flow diverter at the site of the pseudoaneurysm. Risk of 1% to 2% chance of an ischemic event with a remote delayed risk of intracranial hemorrhage were discussed. Both the patient and also her mother's questions were answered to their satisfaction. Patient has decided and the mother has agreed to proceed with endovascular treatment of the pseudoaneurysm. This will be scheduled at the earliest possible. Patient was advised to continue with her dual antiplatelets and to maintain adequate hydration. Past Medical History: Unchanged Medications: Unchanged Allergies: Sulfa antibiotics causes Dana Henry syndrome. Social History: As per recent H and P unchanged Family History:  As per recent H and P unchanged REVIEW OF SYSTEMS: Non contributory. PHYSICAL EXAMINATION: Grossly intact neurologically. ASSESSMENT AND PLAN: See above. The patient and the mother were advised to call for any concerns or questions. They both leave with good understanding and agreement with the above management plan. Electronically Signed   By: Luanne Bras M.D.   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  EXAM: BILATERAL COMMON CAROTID AND INNOMINATE ANGIOGRAPHY COMPARISON:  Arteriogram of November 13, 2020. MEDICATIONS: Heparin 1000 units IV. No antibiotic was administered within 1 hour of the procedure. ANESTHESIA/SEDATION: Versed 1 mg IV; Fentanyl 25 mcg IV Moderate Sedation Time:  34 minutes The patient was continuously monitored during the procedure by the interventional radiology nurse under my direct supervision. CONTRAST:  Isovue 300 approximately 15 mL. FLUOROSCOPY TIME:  Fluoroscopy Time: 3 minutes 48 seconds (405 mGy). COMPLICATIONS: None immediate. TECHNIQUE: Informed written consent was obtained from the patient after a thorough discussion of the procedural risks, benefits and alternatives. All questions were addressed. Maximal Sterile Barrier Technique was utilized including caps, mask, sterile gowns, sterile gloves, sterile drape, hand hygiene and skin antiseptic. A timeout was performed prior to the initiation of the procedure. The right groin was prepped and draped in the usual sterile fashion. Thereafter using modified Seldinger technique, transfemoral access into the right common femoral artery was obtained without difficulty. Over a 0.035 inch guidewire, a 5 French Pinnacle sheath was inserted. Through this, and also over 0.035 inch guidewire, a 5 French Simmons 2 diagnostic catheter was advanced to the aortic arch region and selectively positioned in the right common carotid artery, and the left common carotid artery. FINDINGS: The right common carotid arteriogram demonstrates the right external carotid artery and its major branches to be widely patent. The right internal carotid artery at the bulb to the cranial skull base is widely patent. Patency is seen at the site of the previous stent assisted angioplasty. There are mild FMD like changes just distal to this. Mild fusiform dilatation is seen at the petrous cavernous junction. Seen arising at the proximal horizontal petrous segment of the  right internal carotid artery is a saccular outpouching measuring approximately 6 mm x 3.3 mm at the superior aspect of the vessel. More distally the petrous, the cavernous and the supraclinoid segments are widely patent. The right posterior communicating artery is seen opacifying the right posterior cerebral distribution. The right middle cerebral artery and the right anterior cerebral artery opacify into the capillary and venous phases. Left common carotid arteriogram demonstrates mild stenosis at the origin of the left external carotid artery. Its branches opacify normally. The left internal carotid artery at the bulb to the cranial skull base is widely patent. The petrous, cavernous and supraclinoid segments are widely patent. Arising in the right posterior communicating artery region is an approximately 3.6 mm x 2.4 mm outpouching with the left posterior communicating artery emanating from it. The left middle cerebral artery and the left anterior cerebral artery opacify into the capillary and venous phases. IMPRESSION: Wide patency of the previously treated severe stenosis at the cervical petrous junction of right internal carotid artery with stent assisted angioplasty. 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 A 3.6 mm x 2.4 mm outpouching in the left posterior communicating artery region. Differential is that of an aneurysm versus an infundibulum. PLAN: Findings reviewed with the patient. Patient to return for consultation regarding the management of the angiographic findings. Electronically Signed   By: Luanne Bras M.D.   On: 01/16/2021 15:25    Treatments: anticoagulation: ASA; Brilinta  Discharge Exam: Blood pressure 103/61, pulse 61, temperature 98.4 F (36.9 C), temperature source Oral, resp. rate 11, height 5\' 4"  (1.626 m), weight 171 lb 15.3 oz (78  kg), last menstrual period 01/16/2021, SpO2  97 %, currently breastfeeding. Physical Exam Constitutional:      General: She is not in acute distress.    Appearance: She is not ill-appearing.  HENT:     Mouth/Throat:     Mouth: Mucous membranes are moist.     Pharynx: Oropharynx is clear.  Cardiovascular:     Rate and Rhythm: Normal rate and regular rhythm.     Comments: Right radial access site is clean, dry and non-tender. No bruising observed.  Pulmonary:     Effort: Pulmonary effort is normal.  Musculoskeletal:        General: Normal range of motion.     Cervical back: Normal range of motion.  Skin:    General: Skin is warm and dry.  Neurological:     Mental Status: She is alert and oriented to person, place, and time.     Cranial Nerves: No dysarthria or facial asymmetry.     Motor: No weakness.  Psychiatric:        Mood and Affect: Mood normal.        Behavior: Behavior normal.        Thought Content: Thought content normal.        Judgment: Judgment normal.     Disposition: Discharge to home when she is eating/drinking well and ambulating without assistance.     Allergies as of 02/06/2021      Reactions   Sulfa Antibiotics Hives, Swelling      Medication List    TAKE these medications   acetaminophen 500 MG tablet Commonly known as: TYLENOL Take 1,000 mg by mouth every 6 (six) hours as needed for moderate pain.   aspirin 81 MG chewable tablet Chew 1 tablet (81 mg total) by mouth daily.   atorvastatin 40 MG tablet Commonly known as: LIPITOR Take 1 tablet (40 mg total) by mouth daily.   FLUoxetine 20 MG tablet Commonly known as: PROZAC Take 1 tablet (20 mg total) by mouth daily. Increase to 40 mg after 2 weeks What changed: additional instructions   multivitamin with minerals tablet Take 1 tablet by mouth daily.   pantoprazole 40 MG tablet Commonly known as: PROTONIX Take 1 tablet (40 mg total) by mouth daily.   polyethylene glycol 17 g packet Commonly known as: MIRALAX / GLYCOLAX Take 17 g  by mouth 2 (two) times daily.   ticagrelor 90 MG Tabs tablet Commonly known as: Brilinta Take 1 tablet (90 mg total) by mouth 2 (two) times daily.       Follow-up Information    Luanne Bras, MD Follow up.   Specialties: Interventional Radiology, Radiology Why: Follow up in approximately 2 weeks. Please call our office if you have any questions or concerns prior to your follow up.  Contact information: [place]  [phone]                Electronically Signed: Theresa Duty, NP 02/06/2021, 9:16 AM   I have spent Less Than 30 Minutes discharging [name].

## 2021-02-07 ENCOUNTER — Telehealth: Payer: Self-pay | Admitting: Student

## 2021-02-07 NOTE — Telephone Encounter (Signed)
Patient's mother Cecille Rubin called stating the patient was having intermittent episodes of "seeing stars". With the patient in the background answering questions, the patient stated they are random, brief episodes of seeing stars. She otherwise feels fine and has no other complaints.   This was discussed with Dr. Estanislado Pandy who recommends increasing fluid intake. This information was relayed to [place]. I told Cecille Rubin that if the episodes increase/worsen or the patient develops nausea, vomiting, pain or headaches to go to the ED. I also encouraged her to give IR a call next week if things haven't gotten better.  Soyla Dryer, [place] [phone] 02/07/2021, 1:04 PM

## 2021-02-12 ENCOUNTER — Ambulatory Visit: Payer: BC Managed Care – PPO | Admitting: Occupational Therapy

## 2021-02-13 ENCOUNTER — Other Ambulatory Visit: Payer: Self-pay

## 2021-02-13 ENCOUNTER — Encounter: Payer: Self-pay | Admitting: Family Medicine

## 2021-02-13 NOTE — Patient Outreach (Signed)
[facility]) Care Management  02/13/2021  Timi Reeser 07/08/1981 [id number]   First telephone outreach attempt to obtain mRS. No answer. Left message for returned call.  Philmore Pali [facility] Management Assistant [phone]

## 2021-02-18 ENCOUNTER — Ambulatory Visit: Payer: BC Managed Care – PPO | Admitting: Occupational Therapy

## 2021-02-20 ENCOUNTER — Ambulatory Visit: Payer: BC Managed Care – PPO | Attending: Physical Medicine & Rehabilitation | Admitting: Occupational Therapy

## 2021-02-20 ENCOUNTER — Encounter: Payer: Self-pay | Admitting: Occupational Therapy

## 2021-02-20 ENCOUNTER — Other Ambulatory Visit: Payer: Self-pay

## 2021-02-20 ENCOUNTER — Telehealth: Payer: Self-pay | Admitting: Student

## 2021-02-20 ENCOUNTER — Ambulatory Visit (HOSPITAL_COMMUNITY)
Admission: RE | Admit: 2021-02-20 | Discharge: 2021-02-20 | Disposition: A | Discharge status: Home / Self Care | Payer: BC Managed Care – PPO | Source: Ambulatory Visit | Attending: Student | Admitting: Student

## 2021-02-20 ENCOUNTER — Other Ambulatory Visit (HOSPITAL_COMMUNITY): Payer: Self-pay | Admitting: Radiology

## 2021-02-20 DIAGNOSIS — I729 Aneurysm of unspecified site: Secondary | ICD-10-CM | POA: Insufficient documentation

## 2021-02-20 DIAGNOSIS — R278 Other lack of coordination: Secondary | ICD-10-CM | POA: Insufficient documentation

## 2021-02-20 DIAGNOSIS — I671 Cerebral aneurysm, nonruptured: Secondary | ICD-10-CM | POA: Diagnosis not present

## 2021-02-20 DIAGNOSIS — Z7902 Long term (current) use of antithrombotics/antiplatelets: Secondary | ICD-10-CM | POA: Diagnosis not present

## 2021-02-20 DIAGNOSIS — I63231 Cerebral infarction due to unspecified occlusion or stenosis of right carotid arteries: Secondary | ICD-10-CM

## 2021-02-20 DIAGNOSIS — M6281 Muscle weakness (generalized): Secondary | ICD-10-CM | POA: Diagnosis not present

## 2021-02-20 LAB — PLATELET INHIBITION P2Y12: Platelet Function  P2Y12: 3 [PRU] — ABNORMAL LOW (ref 182–335)

## 2021-02-20 NOTE — Patient Outreach (Signed)
[facility]) Care Management  02/20/2021  Dana Henry [birth year] [id number]   Second telephone outreach attempt to obtain mRS. No answer. Left message for returned call.  Philmore Pali [facility] Management Assistant [phone]

## 2021-02-20 NOTE — Therapy (Signed)
[place] MAIN [facility] SERVICES [address] [place], [place], [address] Phone: [phone]   Fax:  [phone]  Occupational Therapy/Reassessment Treatment Note  Patient Details  Name: Dana Henry MRN: [id number] Date of Birth: 01-10-1981 No data recorded  Encounter Date: 02/20/2021   OT End of Session - 02/20/21 1450    Visit Number 4    Number of Visits 24    Date for OT Re-Evaluation 04/15/21    Authorization Type Progress report period starting 01/21/2021    OT Start Time 1100    OT Stop Time 1145    OT Time Calculation (min) 45 min    Activity Tolerance Patient tolerated treatment well    Behavior During Therapy [facility] for tasks assessed/performed           Past Medical History:  Diagnosis Date  . Anemia   . CVA (cerebral vascular accident) ([place])   . History of cesarean delivery affecting pregnancy 08/10/2017  . History of kidney stones    surgically removed  . Migraines    associated with menstrual cycle  . Pneumonia   . UTI (urinary tract infection)   . Vaginal Pap smear, abnormal     Past Surgical History:  Procedure Laterality Date  . BUBBLE STUDY  11/18/2020   Procedure: BUBBLE STUDY;  Surgeon: Elouise Munroe, MD;  Location: [place];  Service: Cardiology;;  . CESAREAN SECTION N/A 07/21/2014   Procedure: CESAREAN SECTION;  Surgeon: Elveria Royals, MD;  Location: [place] [facility];  Service: Obstetrics;  Laterality: N/A;  . CESAREAN SECTION N/A 08/10/2017   Procedure: Repeat CESAREAN SECTION;  Surgeon: Azucena Fallen, MD;  Location: [place];  Service: Obstetrics;  Laterality: N/A;  EDD: 08/17/17 Allergy: Sulfa  . IR ANGIO INTRA EXTRACRAN SEL COM CAROTID INNOMINATE BILAT MOD SED  01/16/2021  . IR CT HEAD LTD  11/13/2020  . IR INTRA CRAN STENT  11/13/2020  . IR INTRA CRAN STENT  02/05/2021  . IR PERCUTANEOUS ART THROMBECTOMY/INFUSION INTRACRANIAL INC DIAG ANGIO  11/13/2020  . IR RADIOLOGIST EVAL & MGMT  01/21/2021  .  IR TRANSCATH/EMBOLIZ  02/05/2021  . IR US GUIDE VASC ACCESS RIGHT  02/05/2021  . KIDNEY STONE SURGERY    . RADIOLOGY WITH ANESTHESIA N/A 11/13/2020   Procedure: IR WITH ANESTHESIA;  Surgeon: Radiologist, Medication, MD;  Location: [place];  Service: Radiology;  Laterality: N/A;  . RADIOLOGY WITH ANESTHESIA N/A 02/05/2021   Procedure: MRI WITH ANESTHESIA EMBOLIZATION;  Surgeon: Luanne Bras, MD;  Location: [place];  Service: Radiology;  Laterality: N/A;  . TEE WITHOUT CARDIOVERSION N/A 11/18/2020   Procedure: TRANSESOPHAGEAL ECHOCARDIOGRAM (TEE);  Surgeon: Elouise Munroe, MD;  Location: [place];  Service: Cardiology;  Laterality: N/A;  . TONSILLECTOMY AND ADENOIDECTOMY  1991    There were no vitals filed for this visit.   Subjective Assessment - 02/20/21 1450    Subjective  Pt. has returned to therapy following a procedures for Right ICA skull base pseudoaneurysm.    Pertinent History Pt. is a 40 y.o. 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 Pt. is s/p IR with mechanical thrombectomy, and TICI 3 revascularization. Pt. is s/p rescue angioplasty, and stenting for Internal Carotid Petrous seg stenosis. Pt. PMHx: [id number] 10/21/2019.  Pt. 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 Pt. has recently moved from [place] to [place] on the day of  the onset of the CVA.    Limitations Left sided weakness, and FMC    Currently in Pain? No/denies              [facility] OT Assessment - 02/20/21 1108      Coordination   Right 9 Hole Peg Test 23    Left 9 Hole Peg Test 27      Strength   Overall Strength Comments LUE strength: shoulder flexion, and abduction 4+/5, elbow flexion, and extension, 5/5, RUE: 5/5 overall      Hand Function   Right Hand Grip (lbs) 62    Right Hand Lateral Pinch 17 lbs    Right Hand 3 Point Pinch 15 lbs    Left Hand Grip (lbs) 45    Left Hand Lateral Pinch 15  lbs    Left 3 point pinch 14 lbs          Pt. has returned to therapy after having a procedure to repair a right ICA skull base pseudoaneurysm. Pt. with restrictions for no lifting greater than 10#, and no bending. Pt. has a follow up appointment with the surgeon today. Pt.  measurements were obtained, and goals were reviewed with the pt.  Pt. has made progress with her right hand, and is using the hand more during daily ADL, and IADL tasks. Pt. with significant improvements with left grip strength, and was upgraded to blue theraputty for gripping only. Pt. to continue with pink theraputty for pinch, and grasp exercises. Pt. to continue with the same POC and goals to work on improving left UE functioning, including functional reaching, manipulating small buttons on children's clothing, and typing for work related tasks.                   OT Education - 02/20/21 1450    Education Details OT services POC, Goals    Person(s) Educated Patient    Methods Explanation;Demonstration    Comprehension Verbalized understanding;Returned demonstration;Verbal cues required               OT Long Term Goals - 02/20/21 1121      OT LONG TERM GOAL #1   Title Pt. will improve left grip strength to be able to opens containers, and jars.    Baseline 02/20/2021: Pt. is improving, and is opening bottle caps. Eval: Pt. has difficulty    Time 12    Period Weeks    Status On-going    Target Date 04/15/21      OT LONG TERM GOAL #2   Title Pt. will independently, and efficiently perform typing skills in preparation for work related tasks.    Baseline 02/20/2021: Pt. Has started typing minimally. Eval: Pt. has difficulty completing work related typing tasks.    Time 12    Period Weeks    Status On-going    Target Date 04/15/21      OT LONG TERM GOAL #3   Title Pt. will improve left hand [facility] skills in order to be able to independently hold a makeup applicator without dropping it.    Baseline  02/20/2021: Pt. is improving with [facility] skills, is able to hold a makeup applicator, however still occassionally drops them. Eval: pt. frequently drops makeup applicators with her left hand    Time 12    Period Weeks    Status On-going   Target Date 04/15/21      OT LONG TERM GOAL #4   Title Pt. will independently fasten small  buttons efficiently on children's clothing.    Baseline 02/20/2021: Pt. is improving, however continues to take increased time. 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    Time 12    Period Weeks    Status On-going    Target Date 04/15/21      OT LONG TERM GOAL #5   Title Pt. will independently, and efficiently stir with her left hand while baking.    Baseline 02/20/2021: Pt. is cutting fruit.  stirring rythm continues to be incoordinated. Eval: Pt. has difficulty with LUE motor control while stirring.    Time 12    Period Weeks    Status On-going    Target Date 04/15/21      OT LONG TERM GOAL #6   Title Pt. will improve LUE strength to be able to place items on higher shelves.    Baseline 02/20/2021: Pt. is improving with placing items on higher shelves. Eval: Pt. has difficulty placing weighted items onto higher shelves.    Time 12    Period Weeks    Status On-going      OT LONG TERM GOAL #7   Title Pt. will demonstrate significantly relevant FOTO score to promote improved ADL/IADL independence    Baseline 02/20/2021: 68    Time 12    Period Weeks    Status On-going    Target Date 04/15/21                 Plan - 02/20/21 1451    Clinical Impression Statement Pt. has returned to therapy after having a procedure to repair a right ICA skull base pseudoaneurysm. Pt. with restrictions for no lifting greater than 10#, and no bending. Pt. has a follow up appointment with the surgeon today. Pt.  measurements were obtained, and goals were reviewed with the pt.  Pt. has made progress with her right hand, and is using the hand more  during daily ADL, and IADL tasks. Pt. with significant improvements with left grip strength, and was upgraded to blue theraputty for gripping only. Pt. to continue with pink theraputty for pinch, and grasp exercises. Pt. to continue with the same POC and goals to work on improving left UE functioning, including functional reaching, manipulating small buttons on children's clothing, and typing for work related tasks.   OT Occupational Profile and History Detailed Assessment- Review of Records and additional review of physical, cognitive, psychosocial history related to current functional performance    Occupational performance deficits (Please refer to evaluation for details): ADL's;IADL's;Leisure    Body Structure / Function / Physical Skills ADL;IADL;Strength;ROM;Coordination    Rehab Potential Excellent    Clinical Decision Making Several treatment options, min-mod task modification necessary    Comorbidities Affecting Occupational Performance: May have comorbidities impacting occupational performance    Modification or Assistance to Complete Evaluation  Min-Moderate modification of tasks or assist with assess necessary to complete eval    OT Frequency 2x / week    OT Duration 12 weeks    OT Treatment/Interventions Self-care/ADL training;DME and/or AE instruction;Therapeutic exercise;Neuromuscular education;Patient/family education;Therapeutic activities    Consulted and Agree with Plan of Care Patient           Patient will benefit from skilled therapeutic intervention in order to improve the following deficits and impairments:   Body Structure / Function / Physical Skills: ADL,IADL,Strength,ROM,Coordination       Visit Diagnosis: Muscle weakness (generalized)    Problem List Patient Active Problem List   Diagnosis  Date Noted  . 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 Chronic tension-type headache, not intractable 01/09/2021  . Class 1 obesity due to excess calories  with serious comorbidity and body mass index (BMI) of 30.0 to 30.9 in adult   . Slow transit constipation   . Hypoalbuminemia due to protein-calorie malnutrition ([place])   . Marijuana abuse   . Left hemiparesis ([place])   . 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 Dysphagia, post-stroke   . Acute blood loss anemia   . Stroke (cerebrum) ([place]) 11/13/2020  . Internal carotid artery stenosis, right 11/13/2020  . Migraines 10/06/2012    Harrel Carina, MS, OTR/L 02/20/2021, 4:38 PM  [place] MAIN [facility] SERVICES [address] [place], [place], [address] Phone: [phone]   Fax:  [phone]  Name: Dana Henry MRN: [id number] Date of Birth: 07-13-1981

## 2021-02-20 NOTE — Telephone Encounter (Signed)
NIR.  P2Y12 3 PRU today. Patient currently taking Brilinta 90 mg twice daily. Discussed case with Dr. Estanislado Pandy who recommends patient discontinue taking Brilinta 90 mg twice daily and begin taking Brilinta 45 mg twice daily (1/2 tablet twice daily). Called patient x2 at 1442, no answer. Patient returned call at 1451. I was able to speak with both patient and her mother regarding above. All questions answered and concerns addressed. Patient/mother convey understanding and agree with plan.  Please call NIR with questions/concerns.   Bea Graff Valeen Borys, PA-C 02/20/2021, 2:57 PM

## 2021-02-22 HISTORY — PX: IR RADIOLOGIST EVAL & MGMT: IMG5224

## 2021-02-25 ENCOUNTER — Ambulatory Visit: Payer: BC Managed Care – PPO | Admitting: Occupational Therapy

## 2021-02-25 ENCOUNTER — Encounter: Payer: Self-pay | Admitting: Occupational Therapy

## 2021-02-25 ENCOUNTER — Other Ambulatory Visit: Payer: Self-pay

## 2021-02-25 DIAGNOSIS — M6281 Muscle weakness (generalized): Secondary | ICD-10-CM

## 2021-02-25 DIAGNOSIS — R278 Other lack of coordination: Secondary | ICD-10-CM

## 2021-02-25 NOTE — Therapy (Signed)
[place] [facility] MAIN [facility] SERVICES [address] [place], [place], [address] Phone: [phone]   Fax:  [phone]  Occupational Therapy Treatment  Patient Details  Name: Dana Henry MRN: [id number] Date of Birth: 1981-02-09 No data recorded  Encounter Date: 02/25/2021   OT End of Session - 02/25/21 1118    Visit Number 5    Number of Visits 24    Date for OT Re-Evaluation 04/15/21    Authorization Type Progress report period starting 01/21/2021    OT Start Time 1110    OT Stop Time 1145    OT Time Calculation (min) 35 min    Activity Tolerance Patient tolerated treatment well    Behavior During Therapy [facility] for tasks assessed/performed           Past Medical History:  Diagnosis Date  . Anemia   . CVA (cerebral vascular accident) ([facility])   . History of cesarean delivery affecting pregnancy 08/10/2017  . History of kidney stones    surgically removed  . Migraines    associated with menstrual cycle  . Pneumonia   . UTI (urinary tract infection)   . Vaginal Pap smear, abnormal     Past Surgical History:  Procedure Laterality Date  . BUBBLE STUDY  11/18/2020   Procedure: BUBBLE STUDY;  Surgeon: Parke Poisson, MD;  Location: [facility];  Service: Cardiology;;  . CESAREAN SECTION N/A 07/21/2014   Procedure: CESAREAN SECTION;  Surgeon: Robley Fries, MD;  Location: [facility];  Service: Obstetrics;  Laterality: N/A;  . CESAREAN SECTION N/A 08/10/2017   Procedure: Repeat CESAREAN SECTION;  Surgeon: Shea Evans, MD;  Location: [facility];  Service: Obstetrics;  Laterality: N/A;  EDD: 08/17/17 Allergy: Sulfa  . IR ANGIO INTRA EXTRACRAN SEL COM CAROTID INNOMINATE BILAT MOD SED  01/16/2021  . IR CT HEAD LTD  11/13/2020  . IR INTRA CRAN STENT  11/13/2020  . IR INTRA CRAN STENT  02/05/2021  . IR PERCUTANEOUS ART THROMBECTOMY/INFUSION INTRACRANIAL INC DIAG ANGIO  11/13/2020  . IR RADIOLOGIST EVAL & MGMT  01/21/2021  . IR RADIOLOGIST  EVAL & MGMT  02/22/2021  . IR TRANSCATH/EMBOLIZ  02/05/2021  . IR US GUIDE VASC ACCESS RIGHT  02/05/2021  . KIDNEY STONE SURGERY    . RADIOLOGY WITH ANESTHESIA N/A 11/13/2020   Procedure: IR WITH ANESTHESIA;  Surgeon: Radiologist, Medication, MD;  Location: [facility];  Service: Radiology;  Laterality: N/A;  . RADIOLOGY WITH ANESTHESIA N/A 02/05/2021   Procedure: MRI WITH ANESTHESIA EMBOLIZATION;  Surgeon: Julieanne Cotton, MD;  Location: [facility];  Service: Radiology;  Laterality: N/A;  . TEE WITHOUT CARDIOVERSION N/A 11/18/2020   Procedure: TRANSESOPHAGEAL ECHOCARDIOGRAM (TEE);  Surgeon: Parke Poisson, MD;  Location: [facility];  Service: Cardiology;  Laterality: N/A;  . TONSILLECTOMY AND ADENOIDECTOMY  1991    There were no vitals filed for this visit.   Subjective Assessment - 02/25/21 1117    Subjective  Pt. 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    Pertinent History Pt. is a 40 y.o. 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 Pt. is s/p IR with mechanical thrombectomy, and TICI 3 revascularization. Pt. is s/p rescue angioplasty, and stenting for Internal Carotid Petrous seg stenosis. Pt. PMHx: COVID-19 10/21/2019.  Pt. 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 Pt. has recently moved from [facility]  to [place] on the day of the onset of the CVA.    Currently in Pain? No/denies          OT TREATMENT    Neuro muscular re-education:  Pt. worked on [facility] skills using the [employer] Task. Pt. worked on sustaining grasp on the resistive tweezers while grasping this sticks, and moving them from a horizontal position to a vertical position to prepare for placing them into the pegboard. Pt. required verbal cues, and cues for visual demonstration for wrist position, and hand pattern when placing them into the pegboard. Pt. worked on removing the pegs while grasping with  her thumb to the 2nd digit, and thumb. Pt. worked on bilateral [facility] skills simultaneously grasping 1" sticks, 1/4" collars, and 1/4" washers. Pt. worked on storing the objects in the palm, and translatory skills moving the items from the palm of the hand to the tip of the 2nd digit, and thumb. Pt. worked on removing the pegs using bilateral alternating hand patterns. Pt. Worked on using her left hand to manipulate the tweezers to grasp 1/8" square objects, and placing them onto a flat surface.  Pt. reports that she is feeling better each day after having had the recent pseudoaneurysm repair. Pt. reports that she has begun painting again. Pt. has has returned to work this week. Pt.works from home remotely, and reports that her mother is with her throughout the day as she works remotely as well. Pt. is improving with [facility] skills. Pt. continues to work on improving UE strength, and [facility] skills. Pt. continues to work on improving LUE strength, and [facility] skills in order to be able to manipulate small buttons on her children's clothing, and improve typing for work related tasks.                            OT Education - 02/25/21 1118    Education Details OT services POC, Goals    Person(s) Educated Patient    Methods Explanation;Demonstration    Comprehension Verbalized understanding;Returned demonstration;Verbal cues required               OT Long Term Goals - 02/20/21 1121      OT LONG TERM GOAL #1   Title Pt. will improve left grip strength to be able to opens containers, and jars.    Baseline 02/20/2021: Pt. is improving, and is opening bottle caps. Eval: Pt. has difficulty    Time 12    Period Weeks    Status On-going    Target Date 04/15/21      OT LONG TERM GOAL #2   Title Pt. will independently, and efficiently perform typing skills in preparation for work related tasks.    Baseline 02/20/2021: pt. has resumed typing minimally. Eval: Pt. has difficulty completing work  related typing tasks.    Time 12    Period Weeks    Status On-going    Target Date 04/15/21      OT LONG TERM GOAL #3   Title Pt. will improve left hand [facility] skills in order to be able to independently hold a makeup applicator without dropping it.    Baseline 02/20/2021: Pt. is improving with [facility] skills, is able to hold a makeup applicator, however still occassionally drops them. Eval: pt. frequently drops makeup applicators with her left hand    Time 12    Period Weeks    Status On-going    Target  Date 04/15/21      OT LONG TERM GOAL #4   Title Pt. will independently fasten small buttons efficiently on children's clothing.    Baseline 02/20/2021: Pt. is improving, however continues to take increased time. 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    Time 12    Period Weeks    Status On-going    Target Date 04/15/21      OT LONG TERM GOAL #5   Title Pt. will independently, and efficiently stir with her left hand while baking.    Baseline 02/20/2021: Pt. is cutting fruit.  stirring rythm continues to be incoordinated. Eval: Pt. has difficulty with LUE motor control while stirring.    Time 12    Period Weeks    Status On-going    Target Date 04/15/21      OT LONG TERM GOAL #6   Title Pt. will improve LUE strength to be able to place items on higher shelves.    Baseline 02/20/2021: Pt. is improving with placing items on higher shelves. Eval: Pt. has difficulty placing weighted items onto higher shelves.    Time 12    Period Weeks    Status On-going      OT LONG TERM GOAL #7   Title Pt. will demonstrate significantly relevant FOTO score to promote improved ADL/IADL independence    Baseline 02/20/2021: 68    Time 12    Period Weeks    Status On-going    Target Date 04/15/21                 Plan - 02/25/21 1120    Clinical Impression Statement Pt. reports that she is feeling better each day after having had the recent pseudoaneurysm repair. Pt.  reports that she has begun painting again. Pt. has has returned to work this week. Pt.works from home remotely, and reports that her mother is with her throughout the day as she works remotely as well. Pt. is improving with [facility] skills. Pt. continues to work on improving UE strength, and [facility] skills. Pt. continues to work on improving LUE strength, and [facility] skills in order to be able to manipulate small buttons on her children's clothing, and improve typing for work related tasks.   OT Occupational Profile and History Detailed Assessment- Review of Records and additional review of physical, cognitive, psychosocial history related to current functional performance    Occupational performance deficits (Please refer to evaluation for details): ADL's;IADL's;Leisure    Body Structure / Function / Physical Skills ADL;IADL;Strength;ROM;Coordination    Rehab Potential Excellent    Clinical Decision Making Several treatment options, min-mod task modification necessary    Comorbidities Affecting Occupational Performance: May have comorbidities impacting occupational performance    Modification or Assistance to Complete Evaluation  Min-Moderate modification of tasks or assist with assess necessary to complete eval    OT Frequency 2x / week    OT Treatment/Interventions Self-care/ADL training;DME and/or AE instruction;Therapeutic exercise;Neuromuscular education;Patient/family education;Therapeutic activities    Consulted and Agree with Plan of Care Patient           Patient will benefit from skilled therapeutic intervention in order to improve the following deficits and impairments:   Body Structure / Function / Physical Skills: ADL,IADL,Strength,ROM,Coordination       Visit Diagnosis: Muscle weakness (generalized)  Other lack of coordination    Problem List Patient Active Problem List   Diagnosis Date Noted  . 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  Chronic tension-type headache,  not intractable 01/09/2021  . Class 1 obesity due to excess calories with serious comorbidity and body mass index (BMI) of 30.0 to 30.9 in adult   . Slow transit constipation   . Hypoalbuminemia due to protein-calorie malnutrition ([place])   . Marijuana abuse   . Left hemiparesis ([place])   . 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 Dysphagia, post-stroke   . Acute blood loss anemia   . Stroke (cerebrum) ([place]) 11/13/2020  . Internal carotid artery stenosis, right 11/13/2020  . Migraines 10/06/2012    Harrel Carina, MS, OTR/L 02/25/2021, 11:31 AM  [place] MAIN [facility] SERVICES [address] [place], [place], [address] Phone: [phone]   Fax:  [phone]  Name: Akacia Boltz MRN: [id number] Date of Birth: [birth year]

## 2021-02-26 ENCOUNTER — Other Ambulatory Visit: Payer: Self-pay

## 2021-02-26 NOTE — Patient Outreach (Signed)
[facility] [facility]) Care Management  02/26/2021  Denesha Brouse 1980-12-27 [phone]   3 outreach attempts were completed to obtain mRs. mRs could not be obtained because patient never returned my calls. mRs=7    English Management Assistant [phone]

## 2021-02-28 ENCOUNTER — Ambulatory Visit: Payer: BC Managed Care – PPO | Admitting: Occupational Therapy

## 2021-03-03 ENCOUNTER — Other Ambulatory Visit: Payer: Self-pay

## 2021-03-03 ENCOUNTER — Encounter: Payer: Self-pay | Admitting: Occupational Therapy

## 2021-03-03 ENCOUNTER — Ambulatory Visit: Payer: BC Managed Care – PPO | Admitting: Occupational Therapy

## 2021-03-03 DIAGNOSIS — M6281 Muscle weakness (generalized): Secondary | ICD-10-CM

## 2021-03-03 DIAGNOSIS — R278 Other lack of coordination: Secondary | ICD-10-CM

## 2021-03-03 NOTE — Therapy (Signed)
[facility] MAIN [facility] SERVICES [address] [place], [place], [address] Phone: [phone]   Fax:  [phone]  Occupational Therapy Treatment  Patient Details  Name: Dana Henry MRN: [id number] Date of Birth: [birth year] No data recorded  Encounter Date: 03/03/2021   OT End of Session - 03/03/21 1327    Visit Number 6    Number of Visits 24    Date for OT Re-Evaluation 04/15/21    Authorization Type Progress report period starting 01/21/2021    OT Start Time 1300    OT Stop Time 1345    OT Time Calculation (min) 45 min    Activity Tolerance Patient tolerated treatment well    Behavior During Therapy [facility] for tasks assessed/performed           Past Medical History:  Diagnosis Date  . Anemia   . CVA (cerebral vascular accident) (HCC)   . History of cesarean delivery affecting pregnancy 08/10/2017  . History of kidney stones    surgically removed  . Migraines    associated with menstrual cycle  . Pneumonia   . UTI (urinary tract infection)   . Vaginal Pap smear, abnormal     Past Surgical History:  Procedure Laterality Date  . BUBBLE STUDY  11/18/2020   Procedure: BUBBLE STUDY;  Surgeon: Parke Poisson, MD;  Location[facility] ENDOSCOPY;  Service: Cardiology;;  . CESAREAN SECTION N/A 07/21/2014   Procedure: CESAREAN SECTION;  Surgeon: Robley Fries, MD;  Location: [facility] ORS;  Service: Obstetrics;  Laterality: N/A;  . CESAREAN SECTION N/A 08/10/2017   Procedure: Repeat CESAREAN SECTION;  Surgeon: Shea Evans, MD;  Location: [facility] BIRTHING SUITES;  Service: Obstetrics;  Laterality: N/A;  EDD: 08/17/17 Allergy: Sulfa  . IR ANGIO INTRA EXTRACRAN SEL COM CAROTID INNOMINATE BILAT MOD SED  01/16/2021  . IR CT HEAD LTD  11/13/2020  . IR INTRA CRAN STENT  11/13/2020  . IR INTRA CRAN STENT  02/05/2021  . IR PERCUTANEOUS ART THROMBECTOMY/INFUSION INTRACRANIAL INC DIAG ANGIO  11/13/2020  . IR RADIOLOGIST EVAL & MGMT  01/21/2021  . IR RADIOLOGIST  EVAL & MGMT  02/22/2021  . IR TRANSCATH/EMBOLIZ  02/05/2021  . IR US GUIDE VASC ACCESS RIGHT  02/05/2021  . KIDNEY STONE SURGERY    . RADIOLOGY WITH ANESTHESIA N/A 11/13/2020   Procedure: IR WITH ANESTHESIA;  Surgeon: Radiologist, Medication, MD;  Location: [facility] OR;  Service: Radiology;  Laterality: N/A;  . RADIOLOGY WITH ANESTHESIA N/A 02/05/2021   Procedure: MRI WITH ANESTHESIA EMBOLIZATION;  Surgeon: Julieanne Cotton, MD;  Location: [facility] OR;  Service: Radiology;  Laterality: N/A;  . TEE WITHOUT CARDIOVERSION N/A 11/18/2020   Procedure: TRANSESOPHAGEAL ECHOCARDIOGRAM (TEE);  Surgeon: Parke Poisson, MD;  Location: [facility] ENDOSCOPY;  Service: Cardiology;  Laterality: N/A;  . TONSILLECTOMY AND ADENOIDECTOMY  1991    There were no vitals filed for this visit.   Subjective Assessment - 03/03/21 1324    Subjective  Pt. reports that it has been slow going getiing back to work.    Pertinent History Pt. is a 40 y.o. 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 Pt. is s/p IR with mechanical thrombectomy, and TICI 3 revascularization. Pt. is s/p rescue angioplasty, and stenting for Internal Carotid Petrous seg stenosis. Pt. PMHx: COVID-19 10/21/2019.  Pt. 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 Pt. has recently moved from [facility]  to [place] on the day of the onset of the CVA.    Currently in Pain? No/denies          OT TREATMENT    Neuro muscular re-education:  Pt. worked on left [facility] skills manipulating nuts, and bolts on a bolt board. Pt. worked on screwing, and unscrewing nuts, and bolts of varying sizes, and challenging progressively smaller items. Pt. performed the task with her vision occluding the left hand.  Pt. was late for the therapy session today. Pt. reports that her daughter was sick with high fevers over the weekend. Pt. has resumed working from home. Pt. reports that her typing is  starting to come along. Pt. was able to manipulate the bolts of varying size with her left hand, and was able to reconnect the nuts to the bolts. However, dropped the smallest nuts when attempting to reconnect them to the bolts. Pt. Continues to work on improving LUE strength, and [facility] skills in order to be able to reach for items in cabinetry, perform work related typing tasks, and fasten small buttons children's clothing.                           OT Education - 03/03/21 1327    Education Details OT services POC, Goals    Person(s) Educated Patient    Methods Explanation;Demonstration    Comprehension Verbalized understanding;Returned demonstration;Verbal cues required               OT Long Term Goals - 02/20/21 1121      OT LONG TERM GOAL #1   Title Pt. will improve left grip strength to be able to opens containers, and jars.    Baseline 02/20/2021: Pt. is improving, and is opening bottle caps. Eval: Pt. has difficulty    Time 12    Period Weeks    Status On-going    Target Date 04/15/21      OT LONG TERM GOAL #2   Title Pt. will independently, and efficiently perform typing skills in preparation for work related tasks.    Baseline 02/20/2021: pt. has resumed typing minimally. Eval: Pt. has difficulty completing work related typing tasks.    Time 12    Period Weeks    Status On-going    Target Date 04/15/21      OT LONG TERM GOAL #3   Title Pt. will improve left hand [facility] skills in order to be able to independently hold a makeup applicator without dropping it.    Baseline 02/20/2021: Pt. is improving with [facility] skills, is able to hold a makeup applicator, however still occassionally drops them. Eval: pt. frequently drops makeup applicators with her left hand    Time 12    Period Weeks    Status On-going    Target Date 04/15/21      OT LONG TERM GOAL #4   Title Pt. will independently fasten small buttons efficiently on children's clothing.    Baseline  02/20/2021: Pt. is improving, however continues to take increased time. 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    Time 12    Period Weeks    Status On-going    Target Date 04/15/21      OT LONG TERM GOAL #5   Title Pt. will independently, and efficiently stir with her left hand while baking.    Baseline 02/20/2021: Pt. is cutting fruit.  stirring rythm continues to be incoordinated. Eval: Pt.  has difficulty with LUE motor control while stirring.    Time 12    Period Weeks    Status On-going    Target Date 04/15/21      OT LONG TERM GOAL #6   Title Pt. will improve LUE strength to be able to place items on higher shelves.    Baseline 02/20/2021: Pt. is improving with placing items on higher shelves. Eval: Pt. has difficulty placing weighted items onto higher shelves.    Time 12    Period Weeks    Status On-going      OT LONG TERM GOAL #7   Title Pt. will demonstrate significantly relevant FOTO score to promote improved ADL/IADL independence    Baseline 02/20/2021: 68    Time 12    Period Weeks    Status On-going    Target Date 04/15/21                 Plan - 03/03/21 1330    Clinical Impression Statement Pt. was late for the therapy session today. Pt. reports that her daughter was sick with high fevers over the weekend. Pt. has resumed working from home. Pt. reports that her typing is starting to come along. Pt. was able to manipulate the bolts of varying size with her left hand, and was able to reconnect the nuts to the bolts. However, dropped the smallest nuts when attempting to reconnect them to the bolts. Pt. Continues to work on improving LUE strength, and [facility] skills in order to be able to reach for items in cabinetry, perform work related typing tasks, and fasten small buttons children's clothing.    OT Occupational Profile and History Detailed Assessment- Review of Records and additional review of physical, cognitive, psychosocial history  related to current functional performance    Occupational performance deficits (Please refer to evaluation for details): ADL's;IADL's;Leisure    Body Structure / Function / Physical Skills ADL;IADL;Strength;ROM;Coordination    Rehab Potential Excellent    Clinical Decision Making Several treatment options, min-mod task modification necessary    Comorbidities Affecting Occupational Performance: May have comorbidities impacting occupational performance    Modification or Assistance to Complete Evaluation  Min-Moderate modification of tasks or assist with assess necessary to complete eval    OT Frequency 2x / week    OT Duration 12 weeks    OT Treatment/Interventions Self-care/ADL training;DME and/or AE instruction;Therapeutic exercise;Neuromuscular education;Patient/family education;Therapeutic activities    Consulted and Agree with Plan of Care Patient           Patient will benefit from skilled therapeutic intervention in order to improve the following deficits and impairments:   Body Structure / Function / Physical Skills: ADL,IADL,Strength,ROM,Coordination       Visit Diagnosis: Muscle weakness (generalized)  Other lack of coordination    Problem List Patient Active Problem List   Diagnosis Date Noted  . 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 Chronic tension-type headache, not intractable 01/09/2021  . Class 1 obesity due to excess calories with serious comorbidity and body mass index (BMI) of 30.0 to 30.9 in adult   . Slow transit constipation   . Hypoalbuminemia due to protein-calorie malnutrition ([place])   . Marijuana abuse   . Left hemiparesis ([place])   . 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 Dysphagia, post-stroke   . Acute blood loss anemia   . Stroke (cerebrum) ([place]) 11/13/2020  . Internal carotid artery stenosis, right 11/13/2020  . Migraines 10/06/2012    Margaretha Sheffield  Gelisa Tieken, MS, OTR/L 03/03/2021, 3:16 PM  [place] MAIN [facility] SERVICES [address] [place], [place], [address] Phone: [phone]   Fax:  [phone]  Name: Dana Henry MRN: [id number] Date of Birth: 02/13/1981

## 2021-03-05 ENCOUNTER — Ambulatory Visit: Payer: BC Managed Care – PPO | Admitting: Occupational Therapy

## 2021-03-10 ENCOUNTER — Ambulatory Visit: Payer: BC Managed Care – PPO | Admitting: Occupational Therapy

## 2021-03-10 ENCOUNTER — Other Ambulatory Visit: Payer: Self-pay

## 2021-03-10 ENCOUNTER — Encounter: Payer: Self-pay | Admitting: Occupational Therapy

## 2021-03-10 DIAGNOSIS — M6281 Muscle weakness (generalized): Secondary | ICD-10-CM

## 2021-03-10 DIAGNOSIS — R278 Other lack of coordination: Secondary | ICD-10-CM | POA: Diagnosis not present

## 2021-03-10 NOTE — Therapy (Signed)
[place] MAIN [facility] SERVICES [address] [place], [place], [address] Phone: [phone]   Fax:  [phone]  Occupational Therapy Treatment  Patient Details  Name: Dana Henry MRN: [id number] Date of Birth: 07-21-1981 No data recorded  Encounter Date: 03/10/2021   OT End of Session - 03/10/21 1310    Visit Number 7    Number of Visits 24    Date for OT Re-Evaluation 04/15/21    Authorization Type Progress report period starting 01/21/2021    OT Start Time 1305    OT Stop Time 1345    OT Time Calculation (min) 40 min    Activity Tolerance Patient tolerated treatment well    Behavior During Therapy [facility] for tasks assessed/performed           Past Medical History:  Diagnosis Date  . Anemia   . CVA (cerebral vascular accident) ([place])   . History of cesarean delivery affecting pregnancy 08/10/2017  . History of kidney stones    surgically removed  . Migraines    associated with menstrual cycle  . Pneumonia   . UTI (urinary tract infection)   . Vaginal Pap smear, abnormal     Past Surgical History:  Procedure Laterality Date  . BUBBLE STUDY  11/18/2020   Procedure: BUBBLE STUDY;  Surgeon: Elouise Munroe, MD;  Location: [place];  Service: Cardiology;;  . CESAREAN SECTION N/A 07/21/2014   Procedure: CESAREAN SECTION;  Surgeon: Elveria Royals, MD;  Location: [place] ORS;  Service: Obstetrics;  Laterality: N/A;  . CESAREAN SECTION N/A 08/10/2017   Procedure: Repeat CESAREAN SECTION;  Surgeon: Azucena Fallen, MD;  Location: [place];  Service: Obstetrics;  Laterality: N/A;  EDD: 08/17/17 Allergy: Sulfa  . IR ANGIO INTRA EXTRACRAN SEL COM CAROTID INNOMINATE BILAT MOD SED  01/16/2021  . IR CT HEAD LTD  11/13/2020  . IR INTRA CRAN STENT  11/13/2020  . IR INTRA CRAN STENT  02/05/2021  . IR PERCUTANEOUS ART THROMBECTOMY/INFUSION INTRACRANIAL INC DIAG ANGIO  11/13/2020  . IR RADIOLOGIST EVAL & MGMT  01/21/2021  . IR RADIOLOGIST  EVAL & MGMT  02/22/2021  . IR TRANSCATH/EMBOLIZ  02/05/2021  . IR US GUIDE VASC ACCESS RIGHT  02/05/2021  . KIDNEY STONE SURGERY    . RADIOLOGY WITH ANESTHESIA N/A 11/13/2020   Procedure: IR WITH ANESTHESIA;  Surgeon: Radiologist, Medication, MD;  Location: [place];  Service: Radiology;  Laterality: N/A;  . RADIOLOGY WITH ANESTHESIA N/A 02/05/2021   Procedure: MRI WITH ANESTHESIA EMBOLIZATION;  Surgeon: Luanne Bras, MD;  Location: [place];  Service: Radiology;  Laterality: N/A;  . TEE WITHOUT CARDIOVERSION N/A 11/18/2020   Procedure: TRANSESOPHAGEAL ECHOCARDIOGRAM (TEE);  Surgeon: Elouise Munroe, MD;  Location: [place];  Service: Cardiology;  Laterality: N/A;  . TONSILLECTOMY AND ADENOIDECTOMY  1991    There were no vitals filed for this visit.   Subjective Assessment - 03/10/21 1309    Subjective  Pt reports she takes more breaks from typing for work on the computer.    Pertinent History Pt. is a 40 y.o. 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 Pt. is s/p IR with mechanical thrombectomy, and TICI 3 revascularization. Pt. is s/p rescue angioplasty, and stenting for Internal Carotid Petrous seg stenosis. Pt. PMHx: [id number] 10/21/2019.  Pt. 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 Pt. has recently moved from  [place] to [place] on the day of the onset of the CVA.    Limitations Left sided weakness, and FMC    Currently in Pain? No/denies              Neuromuscular Re-education Pt worked on Agricultural engineer backings of various sizes from container and threaded onto paperclip. VCs to use RUE to stabilize only. Pt intermittently dropping earring backings and unable to complete with metal backings that require greater force to thread than plastic backings. Pt completed x30 trials with minimal fatigue noted. Pt threaded x10 buttons onto thread, required increased time. Pt worked on  [facility] skills using the [employer] Task. Pt. worked on sustaining grasp on the resistive tweezers while grasping this sticks, and moving them from a horizontal position to a vertical position to prepare for placing them into the pegboard. Pt. Required verbal cues, and cues for visual demonstration for wrist position, and hand pattern when placing them into the pegboard. Pt completed 30 pegs in 6 min.               OT Education - 03/10/21 1310    Education Details HEP    Methods Explanation;Demonstration    Comprehension Verbalized understanding;Returned demonstration;Verbal cues required               OT Long Term Goals - 02/20/21 1121      OT LONG TERM GOAL #1   Title Pt. will improve left grip strength to be able to opens containers, and jars.    Baseline 02/20/2021: Pt. is improving, and is opening bottle caps. Eval: Pt. has difficulty    Time 12    Period Weeks    Status On-going    Target Date 04/15/21      OT LONG TERM GOAL #2   Title Pt. will independently, and efficiently perform typing skills in preparation for work related tasks.    Baseline 02/20/2021: pt. has resumed typing minimally. Eval: Pt. has difficulty completing work related typing tasks.    Time 12    Period Weeks    Status On-going    Target Date 04/15/21      OT LONG TERM GOAL #3   Title Pt. will improve left hand [facility] skills in order to be able to independently hold a makeup applicator without dropping it.    Baseline 02/20/2021: Pt. is improving with [facility] skills, is able to hold a makeup applicator, however still occassionally drops them. Eval: pt. frequently drops makeup applicators with her left hand    Time 12    Period Weeks    Status On-going    Target Date 04/15/21      OT LONG TERM GOAL #4   Title Pt. will independently fasten small buttons efficiently on children's clothing.    Baseline 02/20/2021: Pt. is improving, however continues to take increased time. Eval: Pt. has  difficulty fastening buttons on her children's clothing.    Time 12    Period Weeks    Status On-going    Target Date 04/15/21      OT LONG TERM GOAL #5   Title Pt. will independently, and efficiently stir with her left hand while baking.    Baseline 02/20/2021: Pt. is cutting fruit.  stirring rythm continues to be incoordinated. Eval: Pt. has difficulty with LUE motor control while stirring.    Time 12    Period Weeks    Status On-going    Target Date 04/15/21  OT LONG TERM GOAL #6   Title Pt. will improve LUE strength to be able to place items on higher shelves.    Baseline 02/20/2021: Pt. is improving with placing items on higher shelves. Eval: Pt. has difficulty placing weighted items onto higher shelves.    Time 12    Period Weeks    Status On-going      OT LONG TERM GOAL #7   Title Pt. will demonstrate significantly relevant FOTO score to promote improved ADL/IADL independence    Baseline 02/20/2021: 68    Time 12    Period Weeks    Status On-going    Target Date 04/15/21                 Plan - 03/10/21 1314    Clinical Impression Statement Pt reports that she works full time from home, requires increased breaks for typing. Pt was able to manipulate the earring backings of varying size with her left hand while maintaining grasp on other backings in palm of L hand. Intermittently dropped the smallest plastic backings. Pt completed [employer] tweezer dexterity pegboard completing 30 pegs in 6 min. Pt continues to work on improving LUE strength, and [facility] skills in order to be able to reach for items in cabinetry, perform work related typing tasks, and fasten small buttons children's clothing.    OT Occupational Profile and History Detailed Assessment- Review of Records and additional review of physical, cognitive, psychosocial history related to current functional performance    Occupational performance deficits (Please refer to evaluation for details): ADL's;IADL's;Leisure     Body Structure / Function / Physical Skills ADL;IADL;Strength;ROM;Coordination    Rehab Potential Excellent    Clinical Decision Making Several treatment options, min-mod task modification necessary    Comorbidities Affecting Occupational Performance: May have comorbidities impacting occupational performance    Modification or Assistance to Complete Evaluation  Min-Moderate modification of tasks or assist with assess necessary to complete eval    OT Frequency 2x / week    OT Duration 12 weeks    OT Treatment/Interventions Self-care/ADL training;DME and/or AE instruction;Therapeutic exercise;Neuromuscular education;Patient/family education;Therapeutic activities    Consulted and Agree with Plan of Care Patient           Patient will benefit from skilled therapeutic intervention in order to improve the following deficits and impairments:   Body Structure / Function / Physical Skills: ADL,IADL,Strength,ROM,Coordination       Visit Diagnosis: Muscle weakness (generalized)  Other lack of coordination    Problem List Patient Active Problem List   Diagnosis Date Noted  . 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 Chronic tension-type headache, not intractable 01/09/2021  . Class 1 obesity due to excess calories with serious comorbidity and body mass index (BMI) of 30.0 to 30.9 in adult   . Slow transit constipation   . Hypoalbuminemia due to protein-calorie malnutrition ([place])   . Marijuana abuse   . Left hemiparesis ([place])   . 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 Dysphagia, post-stroke   . Acute blood loss anemia   . Stroke (cerebrum) ([place]) 11/13/2020  . Internal carotid artery stenosis, right 11/13/2020  . Migraines 10/06/2012    Dessie Coma, M.S. OTR/L  03/10/21, 2:50 PM  ascom [phone]  [place] MAIN [facility] SERVICES [address] [place], [place], [address] Phone: [phone]   Fax:   [phone]  Name: Dana Henry MRN: [id number] Date of Birth: 1980/11/08

## 2021-03-12 ENCOUNTER — Encounter: Payer: Self-pay | Admitting: Occupational Therapy

## 2021-03-12 ENCOUNTER — Other Ambulatory Visit: Payer: Self-pay

## 2021-03-12 ENCOUNTER — Ambulatory Visit: Payer: BC Managed Care – PPO | Admitting: Occupational Therapy

## 2021-03-12 DIAGNOSIS — R278 Other lack of coordination: Secondary | ICD-10-CM

## 2021-03-12 DIAGNOSIS — M6281 Muscle weakness (generalized): Secondary | ICD-10-CM

## 2021-03-12 NOTE — Therapy (Signed)
[place] MAIN [facility] SERVICES [address] [place], [place], [address] Phone: [phone]   Fax:  [phone]  Occupational Therapy Treatment  Patient Details  Name: Dana Henry MRN: [id number] Date of Birth: October 21, 1980 No data recorded  Encounter Date: 03/12/2021   OT End of Session - 03/12/21 1324    Visit Number 8    Number of Visits 24    Date for OT Re-Evaluation 04/15/21    Authorization Type Progress report period starting 01/21/2021    OT Start Time 1319    OT Stop Time 1345    OT Time Calculation (min) 26 min    Activity Tolerance Patient tolerated treatment well    Behavior During Therapy [facility] for tasks assessed/performed           Past Medical History:  Diagnosis Date  . Anemia   . CVA (cerebral vascular accident) ([place])   . History of cesarean delivery affecting pregnancy 08/10/2017  . History of kidney stones    surgically removed  . Migraines    associated with menstrual cycle  . Pneumonia   . UTI (urinary tract infection)   . Vaginal Pap smear, abnormal     Past Surgical History:  Procedure Laterality Date  . BUBBLE STUDY  11/18/2020   Procedure: BUBBLE STUDY;  Surgeon: Elouise Munroe, MD;  Location: [place];  Service: Cardiology;;  . CESAREAN SECTION N/A 07/21/2014   Procedure: CESAREAN SECTION;  Surgeon: Elveria Royals, MD;  Location: [place] [facility];  Service: Obstetrics;  Laterality: N/A;  . CESAREAN SECTION N/A 08/10/2017   Procedure: Repeat CESAREAN SECTION;  Surgeon: Azucena Fallen, MD;  Location: [place];  Service: Obstetrics;  Laterality: N/A;  EDD: 08/17/17 Allergy: Sulfa  . IR ANGIO INTRA EXTRACRAN SEL COM CAROTID INNOMINATE BILAT MOD SED  01/16/2021  . IR CT HEAD LTD  11/13/2020  . IR INTRA CRAN STENT  11/13/2020  . IR INTRA CRAN STENT  02/05/2021  . IR PERCUTANEOUS ART THROMBECTOMY/INFUSION INTRACRANIAL INC DIAG ANGIO  11/13/2020  . IR RADIOLOGIST EVAL & MGMT  01/21/2021  . IR RADIOLOGIST  EVAL & MGMT  02/22/2021  . IR TRANSCATH/EMBOLIZ  02/05/2021  . IR US GUIDE VASC ACCESS RIGHT  02/05/2021  . KIDNEY STONE SURGERY    . RADIOLOGY WITH ANESTHESIA N/A 11/13/2020   Procedure: IR WITH ANESTHESIA;  Surgeon: Radiologist, Medication, MD;  Location: [place];  Service: Radiology;  Laterality: N/A;  . RADIOLOGY WITH ANESTHESIA N/A 02/05/2021   Procedure: MRI WITH ANESTHESIA EMBOLIZATION;  Surgeon: Luanne Bras, MD;  Location: [place];  Service: Radiology;  Laterality: N/A;  . TEE WITHOUT CARDIOVERSION N/A 11/18/2020   Procedure: TRANSESOPHAGEAL ECHOCARDIOGRAM (TEE);  Surgeon: Elouise Munroe, MD;  Location: [place];  Service: Cardiology;  Laterality: N/A;  . TONSILLECTOMY AND ADENOIDECTOMY  1991    There were no vitals filed for this visit.   Subjective Assessment - 03/12/21 1322    Subjective  Pt reports her endurance and motivation for work is lacking.    Pertinent History Pt. is a 40 y.o. 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 Pt. is s/p IR with mechanical thrombectomy, and TICI 3 revascularization. Pt. is s/p rescue angioplasty, and stenting for Internal Carotid Petrous seg stenosis. Pt. PMHx: [id number] 10/21/2019.  Pt. 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 Pt. has recently moved from [place] to [facility]  on the day of the onset of the CVA.    Limitations Left sided weakness, and FMC    Currently in Pain? No/denies            Neuromuscular Re-education Pt arrived late. Pt focused on improving [facility] with manipulating nuts and bolts to build a racecar. Pt completed 22 parts of a 31 part assembly in 26 minutes, required MIN A to screw 1/8" square nuts onto bolts. VCs to utilize R hand for threading nuts on bolts. Plan to complete next session.       OT Education - 03/12/21 1324    Education Details HEP    Person(s) Educated Patient    Methods  Explanation;Demonstration    Comprehension Verbalized understanding;Returned demonstration;Verbal cues required               OT Long Term Goals - 02/20/21 1121      OT LONG TERM GOAL #1   Title Pt. will improve left grip strength to be able to opens containers, and jars.    Baseline 02/20/2021: Pt. is improving, and is opening bottle caps. Eval: Pt. has difficulty    Time 12    Period Weeks    Status On-going    Target Date 04/15/21      OT LONG TERM GOAL #2   Title Pt. will independently, and efficiently perform typing skills in preparation for work related tasks.    Baseline 02/20/2021: pt. has resumed typing minimally. Eval: Pt. has difficulty completing work related typing tasks.    Time 12    Period Weeks    Status On-going    Target Date 04/15/21      OT LONG TERM GOAL #3   Title Pt. will improve left hand [facility] skills in order to be able to independently hold a makeup applicator without dropping it.    Baseline 02/20/2021: Pt. is improving with [facility] skills, is able to hold a makeup applicator, however still occassionally drops them. Eval: pt. frequently drops makeup applicators with her left hand    Time 12    Period Weeks    Status On-going    Target Date 04/15/21      OT LONG TERM GOAL #4   Title Pt. will independently fasten small buttons efficiently on children's clothing.    Baseline 02/20/2021: Pt. is improving, however continues to take increased time. 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    Time 12    Period Weeks    Status On-going    Target Date 04/15/21      OT LONG TERM GOAL #5   Title Pt. will independently, and efficiently stir with her left hand while baking.    Baseline 02/20/2021: Pt. is cutting fruit.  stirring rythm continues to be incoordinated. Eval: Pt. has difficulty with LUE motor control while stirring.    Time 12    Period Weeks    Status On-going    Target Date 04/15/21      OT LONG TERM GOAL #6   Title  Pt. will improve LUE strength to be able to place items on higher shelves.    Baseline 02/20/2021: Pt. is improving with placing items on higher shelves. Eval: Pt. has difficulty placing weighted items onto higher shelves.    Time 12    Period Weeks    Status On-going      OT LONG TERM GOAL #7   Title Pt. will demonstrate significantly relevant FOTO score to promote  improved ADL/IADL independence    Baseline 02/20/2021: 68    Time 12    Period Weeks    Status On-going    Target Date 04/15/21                 Plan - 03/12/21 1325    Clinical Impression Statement Pt arrived late, completed BOLTS 31 piece racecar assembly, completed 22/31 parts. Pt required MIN A to screw smallest 1/8" nut onto bolt 2/2 difficulty maintainin grasp in R hand while threading with L hand. Pt continues to work on improving LUE strength, and [facility] skills in order to be able to reach for items in cabinetry, perform work related typing tasks, and fasten small buttons children's clothing.    OT Occupational Profile and History Detailed Assessment- Review of Records and additional review of physical, cognitive, psychosocial history related to current functional performance    Occupational performance deficits (Please refer to evaluation for details): ADL's;IADL's;Leisure    Body Structure / Function / Physical Skills ADL;IADL;Strength;ROM;Coordination    Rehab Potential Excellent    Clinical Decision Making Several treatment options, min-mod task modification necessary    Comorbidities Affecting Occupational Performance: May have comorbidities impacting occupational performance    Modification or Assistance to Complete Evaluation  Min-Moderate modification of tasks or assist with assess necessary to complete eval    OT Frequency 2x / week    OT Duration 12 weeks    OT Treatment/Interventions Self-care/ADL training;DME and/or AE instruction;Therapeutic exercise;Neuromuscular education;Patient/family  education;Therapeutic activities    Consulted and Agree with Plan of Care Patient           Patient will benefit from skilled therapeutic intervention in order to improve the following deficits and impairments:   Body Structure / Function / Physical Skills: ADL,IADL,Strength,ROM,Coordination       Visit Diagnosis: Other lack of coordination  Muscle weakness (generalized)    Problem List Patient Active Problem List   Diagnosis Date Noted  . 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 Chronic tension-type headache, not intractable 01/09/2021  . Class 1 obesity due to excess calories with serious comorbidity and body mass index (BMI) of 30.0 to 30.9 in adult   . Slow transit constipation   . Hypoalbuminemia due to protein-calorie malnutrition ([place])   . Marijuana abuse   . Left hemiparesis ([place])   . 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 Dysphagia, post-stroke   . Acute blood loss anemia   . Stroke (cerebrum) ([place]) 11/13/2020  . Internal carotid artery stenosis, right 11/13/2020  . Migraines 10/06/2012     Dessie Coma, M.S. OTR/L  03/12/21, 2:36 PM  ascom [phone]  [place] MAIN [facility] SERVICES [address] [place], [place], [address] Phone: [phone]   Fax:  [phone]  Name: Dana Henry MRN: [id number] Date of Birth: 1981/02/19

## 2021-03-13 ENCOUNTER — Encounter: Payer: BC Managed Care – PPO | Admitting: Physical Medicine & Rehabilitation

## 2021-03-19 ENCOUNTER — Ambulatory Visit: Payer: BC Managed Care – PPO | Attending: Physical Medicine & Rehabilitation | Admitting: Occupational Therapy

## 2021-03-19 ENCOUNTER — Other Ambulatory Visit: Payer: Self-pay

## 2021-03-19 ENCOUNTER — Encounter: Payer: Self-pay | Admitting: Occupational Therapy

## 2021-03-19 DIAGNOSIS — R278 Other lack of coordination: Secondary | ICD-10-CM | POA: Insufficient documentation

## 2021-03-19 DIAGNOSIS — M6281 Muscle weakness (generalized): Secondary | ICD-10-CM | POA: Diagnosis not present

## 2021-03-19 NOTE — Therapy (Signed)
[place] MAIN [facility] SERVICES [address] [place], [place], [address] Phone: [phone]   Fax:  [phone]  Occupational Therapy Treatment  Patient Details  Name: Dana Henry MRN: [id number] Date of Birth: Dec 24, 1980 No data recorded  Encounter Date: 03/19/2021   OT End of Session - 03/19/21 1319    Visit Number 9    Number of Visits 24    Date for OT Re-Evaluation 04/15/21    Authorization Type Progress report period starting 01/21/2021    OT Start Time 1312    OT Stop Time 1345    OT Time Calculation (min) 33 min    Activity Tolerance Patient tolerated treatment well    Behavior During Therapy [facility] for tasks assessed/performed           Past Medical History:  Diagnosis Date  . Anemia   . CVA (cerebral vascular accident) ([place])   . History of cesarean delivery affecting pregnancy 08/10/2017  . History of kidney stones    surgically removed  . Migraines    associated with menstrual cycle  . Pneumonia   . UTI (urinary tract infection)   . Vaginal Pap smear, abnormal     Past Surgical History:  Procedure Laterality Date  . BUBBLE STUDY  11/18/2020   Procedure: BUBBLE STUDY;  Surgeon: Elouise Munroe, MD;  Location: [place];  Service: Cardiology;;  . CESAREAN SECTION N/A 07/21/2014   Procedure: CESAREAN SECTION;  Surgeon: Elveria Royals, MD;  Location: [place] [facility];  Service: Obstetrics;  Laterality: N/A;  . CESAREAN SECTION N/A 08/10/2017   Procedure: Repeat CESAREAN SECTION;  Surgeon: Azucena Fallen, MD;  Location: [place];  Service: Obstetrics;  Laterality: N/A;  EDD: 08/17/17 Allergy: Sulfa  . IR ANGIO INTRA EXTRACRAN SEL COM CAROTID INNOMINATE BILAT MOD SED  01/16/2021  . IR CT HEAD LTD  11/13/2020  . IR INTRA CRAN STENT  11/13/2020  . IR INTRA CRAN STENT  02/05/2021  . IR PERCUTANEOUS ART THROMBECTOMY/INFUSION INTRACRANIAL INC DIAG ANGIO  11/13/2020  . IR RADIOLOGIST EVAL & MGMT  01/21/2021  . IR RADIOLOGIST  EVAL & MGMT  02/22/2021  . IR TRANSCATH/EMBOLIZ  02/05/2021  . IR US GUIDE VASC ACCESS RIGHT  02/05/2021  . KIDNEY STONE SURGERY    . RADIOLOGY WITH ANESTHESIA N/A 11/13/2020   Procedure: IR WITH ANESTHESIA;  Surgeon: Radiologist, Medication, MD;  Location: [place];  Service: Radiology;  Laterality: N/A;  . RADIOLOGY WITH ANESTHESIA N/A 02/05/2021   Procedure: MRI WITH ANESTHESIA EMBOLIZATION;  Surgeon: Luanne Bras, MD;  Location: [place];  Service: Radiology;  Laterality: N/A;  . TEE WITHOUT CARDIOVERSION N/A 11/18/2020   Procedure: TRANSESOPHAGEAL ECHOCARDIOGRAM (TEE);  Surgeon: Elouise Munroe, MD;  Location: [place];  Service: Cardiology;  Laterality: N/A;  . TONSILLECTOMY AND ADENOIDECTOMY  1991    There were no vitals filed for this visit.   Subjective Assessment - 03/19/21 1318    Subjective  Pt reports her endurance and motivation for work is lacking.    Pertinent History Pt. is a 40 y.o. 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 Pt. is s/p IR with mechanical thrombectomy, and TICI 3 revascularization. Pt. is s/p rescue angioplasty, and stenting for Internal Carotid Petrous seg stenosis. Pt. PMHx: [id number] 10/21/2019.  Pt. 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 Pt. has recently moved from [place] to [facility]  on the day of the onset of the CVA.    Currently in Pain? No/denies           OT TREATMENT    Neuro muscular re-education:  Pt. worked on [facility] skills using the [employer] Task. Pt. worked on sustaining grasp on the resistive tweezers while grasping this sticks, and moving them from a horizontal position to a vertical position to prepare for placing them into the pegboard. Pt. required verbal cues, and cues for visual demonstration for wrist position, and hand pattern when placing them into the pegboard. Pt. worked on removing the pegs while grasping  with her thumb to the 2nd digit, and thumb. Pt. Worked on using tweezers to pull 1/8" cubes from a narrow neck bottle. Pt. worked on translatory movements grasping and storing the small 1/8" cubes in the palm of her hand, and moving them from her palm to the tip of her 2nd digit, and thumb.   Pt. reports that she has been tired lately since returning to work. Pt. Reports that she has not had a chance to work on painting. Pt.works from home remotely, and reports that her mother is with her throughout the day as she works remotely as well. Pt. is improving with [facility] skills. Pt. continues to work on improving UE strength, and [facility] skills. Pt. continues to work on improving LUE strength, and [facility] skills in order to be able to manipulate small buttons on her children's clothing, and improve typing for work related tasks.                          OT Education - 03/19/21 1319    Education Details HEP    Person(s) Educated Patient    Methods Explanation;Demonstration    Comprehension Verbalized understanding;Returned demonstration;Verbal cues required               OT Long Term Goals - 02/20/21 1121      OT LONG TERM GOAL #1   Title Pt. will improve left grip strength to be able to opens containers, and jars.    Baseline 02/20/2021: Pt. is improving, and is opening bottle caps. Eval: Pt. has difficulty    Time 12    Period Weeks    Status On-going    Target Date 04/15/21      OT LONG TERM GOAL #2   Title Pt. will independently, and efficiently perform typing skills in preparation for work related tasks.    Baseline 02/20/2021: pt. has resumed typing minimally. Eval: Pt. has difficulty completing work related typing tasks.    Time 12    Period Weeks    Status On-going    Target Date 04/15/21      OT LONG TERM GOAL #3   Title Pt. will improve left hand [facility] skills in order to be able to independently hold a makeup applicator without dropping it.    Baseline 02/20/2021: Pt.  is improving with [facility] skills, is able to hold a makeup applicator, however still occassionally drops them. Eval: pt. frequently drops makeup applicators with her left hand    Time 12    Period Weeks    Status On-going    Target Date 04/15/21      OT LONG TERM GOAL #4   Title Pt. will independently fasten small buttons efficiently on children's clothing.    Baseline 02/20/2021: Pt. is improving, however continues to take increased time. Eval: Pt. has difficulty fastening  buttons on her children's clothing.    Time 12    Period Weeks    Status On-going    Target Date 04/15/21      OT LONG TERM GOAL #5   Title Pt. will independently, and efficiently stir with her left hand while baking.    Baseline 02/20/2021: Pt. is cutting fruit.  stirring rythm continues to be incoordinated. Eval: Pt. has difficulty with LUE motor control while stirring.    Time 12    Period Weeks    Status On-going    Target Date 04/15/21      OT LONG TERM GOAL #6   Title Pt. will improve LUE strength to be able to place items on higher shelves.    Baseline 02/20/2021: Pt. is improving with placing items on higher shelves. Eval: Pt. has difficulty placing weighted items onto higher shelves.    Time 12    Period Weeks    Status On-going      OT LONG TERM GOAL #7   Title Pt. will demonstrate significantly relevant FOTO score to promote improved ADL/IADL independence    Baseline 02/20/2021: 68    Time 12    Period Weeks    Status On-going    Target Date 04/15/21                 Plan - 03/19/21 1319    Clinical Impression Statement Pt. reports that she has been tired lately since returning to work. Pt. Reports that she has not had a chance to work on painting. Pt.works from home remotely, and reports that her mother is with her throughout the day as she works remotely as well. Pt. is improving with [facility] skills. Pt. continues to work on improving UE strength, and [facility] skills. Pt. continues to work on improving  LUE strength, and [facility] skills in order to be able to manipulate small buttons on her children's clothing, and improve typing for work related tasks.    OT Occupational Profile and History Detailed Assessment- Review of Records and additional review of physical, cognitive, psychosocial history related to current functional performance    Occupational performance deficits (Please refer to evaluation for details): ADL's;IADL's;Leisure    Body Structure / Function / Physical Skills ADL;IADL;Strength;ROM;Coordination    Rehab Potential Excellent    Clinical Decision Making Several treatment options, min-mod task modification necessary    Comorbidities Affecting Occupational Performance: May have comorbidities impacting occupational performance    Modification or Assistance to Complete Evaluation  Min-Moderate modification of tasks or assist with assess necessary to complete eval    OT Frequency 2x / week    OT Duration 12 weeks    OT Treatment/Interventions Self-care/ADL training;DME and/or AE instruction;Therapeutic exercise;Neuromuscular education;Patient/family education;Therapeutic activities    Consulted and Agree with Plan of Care Patient           Patient will benefit from skilled therapeutic intervention in order to improve the following deficits and impairments:   Body Structure / Function / Physical Skills: ADL,IADL,Strength,ROM,Coordination       Visit Diagnosis: No diagnosis found.    Problem List Patient Active Problem List   Diagnosis Date Noted  . 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 Chronic tension-type headache, not intractable 01/09/2021  . Class 1 obesity due to excess calories with serious comorbidity and body mass index (BMI) of 30.0 to 30.9 in adult   . Slow transit constipation   . Hypoalbuminemia due to protein-calorie malnutrition ([place])   . Marijuana  abuse   . Left hemiparesis ([place])   . 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  Dysphagia, post-stroke   . Acute blood loss anemia   . Stroke (cerebrum) ([place]) 11/13/2020  . Internal carotid artery stenosis, right 11/13/2020  . Migraines 10/06/2012    Harrel Carina, MS, OTR/L 03/19/2021, 1:24 PM  [place] MAIN [facility] SERVICES [address] [place], [place], [address] Phone: [phone]   Fax:  [phone]  Name: Dana Henry MRN: [id number] Date of Birth: 03-09-1981

## 2021-03-19 NOTE — Progress Notes (Deleted)
[place] at [facility], [place] [address] [place], [place] [address] [phone]  Date:  03/24/2021   Name:  Dana Henry   DOB:  13-Sep-1981   MRN:  [id number]  PCP:  Darreld Mclean, MD    Chief Complaint: No chief complaint on file.   History of Present Illness:  Dana Henry is a 40 y.o. very pleasant female patient who presents with the following:  Patient here today for a physical exam Most recent visit with myself was in March She has been generally healthy, but suffered an ischemic right MCA stroke in January At her last visit she was doing much better, her left-sided weakness was 95% resolved  In April, she had an NIR procedure to treat her right-sided ICA skull base pseudoaneurysm-she was admitted overnight for observation following this procedure and did well  Hepatitis C screening Pap smear COVID-19 booster Mammogram up-to-date Some lab work done in April, BMP and CBC Lipids up-to-date, April Thyroid done in January   Patient Active Problem List   Diagnosis Date Noted  . 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 Chronic tension-type headache, not intractable 01/09/2021  . Class 1 obesity due to excess calories with serious comorbidity and body mass index (BMI) of 30.0 to 30.9 in adult   . Slow transit constipation   . Hypoalbuminemia due to protein-calorie malnutrition ([place])   . Marijuana abuse   . Left hemiparesis ([place])   . 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 Dysphagia, post-stroke   . Acute blood loss anemia   . Stroke (cerebrum) ([place]) 11/13/2020  . Internal carotid artery stenosis, right 11/13/2020  . Migraines 10/06/2012    Past Medical History:  Diagnosis Date  . Anemia   . CVA (cerebral vascular accident) ([place])   . History of cesarean delivery affecting pregnancy 08/10/2017  . History of kidney stones    surgically removed  . Migraines    associated with  menstrual cycle  . Pneumonia   . UTI (urinary tract infection)   . Vaginal Pap smear, abnormal     Past Surgical History:  Procedure Laterality Date  . BUBBLE STUDY  11/18/2020   Procedure: BUBBLE STUDY;  Surgeon: Elouise Munroe, MD;  Location: [facility];  Service: Cardiology;;  . CESAREAN SECTION N/A 07/21/2014   Procedure: CESAREAN SECTION;  Surgeon: Elveria Royals, MD;  Location: [facility];  Service: Obstetrics;  Laterality: N/A;  . CESAREAN SECTION N/A 08/10/2017   Procedure: Repeat CESAREAN SECTION;  Surgeon: Azucena Fallen, MD;  Location: [facility];  Service: Obstetrics;  Laterality: N/A;  EDD: 08/17/17 Allergy: Sulfa  . IR ANGIO INTRA EXTRACRAN SEL COM CAROTID INNOMINATE BILAT MOD SED  01/16/2021  . IR CT HEAD LTD  11/13/2020  . IR INTRA CRAN STENT  11/13/2020  . IR INTRA CRAN STENT  02/05/2021  . IR PERCUTANEOUS ART THROMBECTOMY/INFUSION INTRACRANIAL INC DIAG ANGIO  11/13/2020  . IR RADIOLOGIST EVAL & MGMT  01/21/2021  . IR RADIOLOGIST EVAL & MGMT  02/22/2021  . IR TRANSCATH/EMBOLIZ  02/05/2021  . IR US GUIDE VASC ACCESS RIGHT  02/05/2021  . KIDNEY STONE SURGERY    . RADIOLOGY WITH ANESTHESIA N/A 11/13/2020   Procedure: IR WITH ANESTHESIA;  Surgeon: Radiologist, Medication, MD;  Location: [facility];  Service: Radiology;  Laterality: N/A;  . RADIOLOGY WITH ANESTHESIA N/A 02/05/2021   Procedure: MRI WITH ANESTHESIA EMBOLIZATION;  Surgeon: Luanne Bras, MD;  Location:  [facility];  Service: Radiology;  Laterality: N/A;  . TEE WITHOUT CARDIOVERSION N/A 11/18/2020   Procedure: TRANSESOPHAGEAL ECHOCARDIOGRAM (TEE);  Surgeon: Elouise Munroe, MD;  Location: [facility];  Service: Cardiology;  Laterality: N/A;  . TONSILLECTOMY AND ADENOIDECTOMY  1991    Social History   Tobacco Use  . Smoking status: Former Smoker    Packs/day: 0.50    Types: Cigarettes    Quit date: 10/19/2013    Years since quitting: 7.4  . Smokeless tobacco: Never Used  Vaping Use  . Vaping Use: Never used   Substance Use Topics  . Alcohol use: Yes    Comment: occassional  . Drug use: No    Family History  Problem Relation Age of Onset  . Hypertension Mother   . Alcohol abuse Father     Allergies  Allergen Reactions  . Sulfa Antibiotics Hives and Swelling    Medication list has been reviewed and updated.  Current Outpatient Medications on File Prior to Visit  Medication Sig Dispense Refill  . acetaminophen (TYLENOL) 500 MG tablet Take 1,000 mg by mouth every 6 (six) hours as needed for moderate pain.    Marland Kitchen aspirin 81 MG chewable tablet Chew 1 tablet (81 mg total) by mouth daily.    Marland Kitchen atorvastatin (LIPITOR) 40 MG tablet Take 1 tablet (40 mg total) by mouth daily. 90 tablet 3  . FLUoxetine (PROZAC) 20 MG tablet Take 1 tablet (20 mg total) by mouth daily. Increase to 40 mg after 2 weeks (Patient taking differently: Take 20 mg by mouth daily.) 60 tablet 5  . Multiple Vitamins-Minerals (MULTIVITAMIN WITH MINERALS) tablet Take 1 tablet by mouth daily.    . pantoprazole (PROTONIX) 40 MG tablet Take 1 tablet (40 mg total) by mouth daily. 90 tablet 3  . polyethylene glycol (MIRALAX / GLYCOLAX) 17 g packet Take 17 g by mouth 2 (two) times daily. 14 each 0  . ticagrelor (BRILINTA) 90 MG TABS tablet Take 1 tablet (90 mg total) by mouth 2 (two) times daily. (Patient taking differently: Take 45 mg by mouth 2 (two) times daily. PER DR. Estanislado Pandy) 60 tablet 5   No current facility-administered medications on file prior to visit.    Review of Systems:  As per HPI- otherwise negative.   Physical Examination: There were no vitals filed for this visit. There were no vitals filed for this visit. There is no height or weight on file to calculate BMI. Ideal Body Weight:    GEN: no acute distress. HEENT: Atraumatic, Normocephalic.  Ears and Nose: No external deformity. CV: RRR, No M/G/R. No JVD. No thrill. No extra heart sounds. PULM: CTA B, no wheezes, crackles, rhonchi. No retractions. No  resp. distress. No accessory muscle use. ABD: S, NT, ND, +BS. No rebound. No HSM. EXTR: No c/c/e PSYCH: Normally interactive. Conversant.    Assessment and Plan: *** This visit occurred during the SARS-CoV-2 public health emergency.  Safety protocols were in place, including screening questions prior to the visit, additional usage of staff PPE, and extensive cleaning of exam room while observing appropriate contact time as indicated for disinfecting solutions.    Signed Lamar Blinks, MD

## 2021-03-21 ENCOUNTER — Encounter: Payer: BC Managed Care – PPO | Attending: Registered Nurse | Admitting: Registered Nurse

## 2021-03-21 DIAGNOSIS — G44229 Chronic tension-type headache, not intractable: Secondary | ICD-10-CM | POA: Insufficient documentation

## 2021-03-21 DIAGNOSIS — K5901 Slow transit constipation: Secondary | ICD-10-CM | POA: Insufficient documentation

## 2021-03-21 DIAGNOSIS — G8194 Hemiplegia, unspecified affecting left nondominant side: Secondary | ICD-10-CM | POA: Insufficient documentation

## 2021-03-21 DIAGNOSIS — I63511 Cerebral infarction due to unspecified occlusion or stenosis of right middle cerebral artery: Secondary | ICD-10-CM | POA: Insufficient documentation

## 2021-03-24 ENCOUNTER — Ambulatory Visit (INDEPENDENT_AMBULATORY_CARE_PROVIDER_SITE_OTHER): Payer: BC Managed Care – PPO | Admitting: Family Medicine

## 2021-03-24 DIAGNOSIS — Z0000 Encounter for general adult medical examination without abnormal findings: Secondary | ICD-10-CM

## 2021-03-24 DIAGNOSIS — Z538 Procedure and treatment not carried out for other reasons: Secondary | ICD-10-CM

## 2021-03-24 DIAGNOSIS — I639 Cerebral infarction, unspecified: Secondary | ICD-10-CM

## 2021-04-06 NOTE — Progress Notes (Deleted)
[place] at [facility] [address], [place], [place] [address] [phone]  Date:  04/09/2021   Name:  Dana Henry   DOB:  01/07/1981   MRN:  [id number]  PCP:  Darreld Mclean, MD    Chief Complaint: No chief complaint on file.   History of Present Illness:  Dana Henry is a 40 y.o. very pleasant female patient who presents with the following:  Here today for a CPE Last visit with myself in March   Generally healthy until she had a CVA early this year   Date of Admission: 11/13/2020 Date of Discharge: 11/18/2020- then rehab until 11/22/20   Attending Physician:  Garvin Fila, MD, Stroke MD   Discharge Diagnoses:  1. 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 3.  Left hemiparesis 4.  Small PFO likely incidental Active Problems:   Stroke (cerebrum) ([facility])   Internal carotid artery stenosis, right   Dysphagia, post-stroke   Acute blood loss anemia   At our last visit she was doing quite well, felt 95% back to normal She was not yet driving   She may have just completed rehab?  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   Chronic tension-type headache, not intractable 01/09/2021   Class 1 obesity due to excess calories with serious comorbidity and body  mass index (BMI) of 30.0 to 30.9 in adult    Slow transit constipation    Hypoalbuminemia due to protein-calorie malnutrition ([facility])    Marijuana abuse    Left hemiparesis ([facility])    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   Dysphagia, post-stroke    Acute blood loss anemia    Stroke (cerebrum) ([place]) 11/13/2020   Internal carotid artery stenosis, right 11/13/2020   Migraines 10/06/2012    Past Medical History:  Diagnosis Date   Anemia    CVA (cerebral vascular accident) ([place])    History of cesarean delivery affecting pregnancy 08/10/2017   History of kidney stones    surgically removed   Migraines    associated with menstrual cycle   Pneumonia    UTI (urinary tract infection)    Vaginal Pap smear, abnormal     Past Surgical History:  Procedure Laterality Date   BUBBLE STUDY  11/18/2020   Procedure: BUBBLE STUDY;  Surgeon: Elouise Munroe, MD;  Location: [facility];  Service: Cardiology;;   CESAREAN SECTION N/A 07/21/2014   Procedure: CESAREAN SECTION;  Surgeon: Elveria Royals, MD;  Location: [facility];  Service: Obstetrics;  Laterality: N/A;   CESAREAN SECTION N/A 08/10/2017   Procedure: Repeat CESAREAN SECTION;  Surgeon: Azucena Fallen, MD;  Location: [facility];  Service: Obstetrics;  Laterality: N/A;  EDD: 08/17/17 Allergy: Sulfa   IR ANGIO INTRA EXTRACRAN  SEL COM CAROTID INNOMINATE BILAT MOD SED  01/16/2021   IR CT HEAD LTD  11/13/2020   IR INTRA CRAN STENT  11/13/2020   IR INTRA CRAN STENT  02/05/2021   IR PERCUTANEOUS ART THROMBECTOMY/INFUSION INTRACRANIAL INC DIAG ANGIO  11/13/2020   IR RADIOLOGIST EVAL & MGMT  01/21/2021   IR RADIOLOGIST EVAL & MGMT  02/22/2021   IR TRANSCATH/EMBOLIZ  02/05/2021   IR US GUIDE VASC ACCESS RIGHT  02/05/2021   KIDNEY STONE SURGERY     RADIOLOGY WITH ANESTHESIA N/A 11/13/2020   Procedure: IR WITH ANESTHESIA;  Surgeon: Radiologist, Medication, MD;  Location: [facility];  Service: Radiology;  Laterality: N/A;   RADIOLOGY WITH  ANESTHESIA N/A 02/05/2021   Procedure: MRI WITH ANESTHESIA EMBOLIZATION;  Surgeon: Luanne Bras, MD;  Location: [facility];  Service: Radiology;  Laterality: N/A;   TEE WITHOUT CARDIOVERSION N/A 11/18/2020   Procedure: TRANSESOPHAGEAL ECHOCARDIOGRAM (TEE);  Surgeon: Elouise Munroe, MD;  Location: [facility];  Service: Cardiology;  Laterality: N/A;   TONSILLECTOMY AND ADENOIDECTOMY  1991    Social History   Tobacco Use   Smoking status: Former    Packs/day: 0.50    Pack years: 0.00    Types: Cigarettes    Quit date: 10/19/2013    Years since quitting: 7.4   Smokeless tobacco: Never  Vaping Use   Vaping Use: Never used  Substance Use Topics   Alcohol use: Yes    Comment: occassional   Drug use: No    Family History  Problem Relation Age of Onset   Hypertension Mother    Alcohol abuse Father     Allergies  Allergen Reactions   Sulfa Antibiotics Hives and Swelling    Medication list has been reviewed and updated.  Current Outpatient Medications on File Prior to Visit  Medication Sig Dispense Refill   acetaminophen (TYLENOL) 500 MG tablet Take 1,000 mg by mouth every 6 (six) hours as needed for moderate pain.     aspirin 81 MG chewable tablet Chew 1 tablet (81 mg total) by mouth daily.     atorvastatin (LIPITOR) 40 MG tablet Take 1 tablet (40 mg total) by mouth daily. 90 tablet 3   FLUoxetine (PROZAC) 20 MG tablet Take 1 tablet (20 mg total) by mouth daily. Increase to 40 mg after 2 weeks (Patient taking differently: Take 20 mg by mouth daily.) 60 tablet 5   Multiple Vitamins-Minerals (MULTIVITAMIN WITH MINERALS) tablet Take 1 tablet by mouth daily.     pantoprazole (PROTONIX) 40 MG tablet Take 1 tablet (40 mg total) by mouth daily. 90 tablet 3   polyethylene glycol (MIRALAX / GLYCOLAX) 17 g packet Take 17 g by mouth 2 (two) times daily. 14 each 0   ticagrelor (BRILINTA) 90 MG TABS tablet Take 1 tablet (90 mg total) by mouth 2 (two) times daily. (Patient taking  differently: Take 45 mg by mouth 2 (two) times daily. PER DR. Estanislado Pandy) 60 tablet 5   No current facility-administered medications on file prior to visit.    Review of Systems:  As per HPI- otherwise negative.   Physical Examination: There were no vitals filed for this visit. There were no vitals filed for this visit. There is no height or weight on file to calculate BMI. Ideal Body Weight:    GEN: no acute distress. HEENT: Atraumatic, Normocephalic.  Ears and Nose: No external deformity. CV: RRR, No M/G/R. No JVD. No thrill. No extra heart sounds. PULM: CTA B, no wheezes, crackles, rhonchi.  No retractions. No resp. distress. No accessory muscle use. ABD: S, NT, ND, +BS. No rebound. No HSM. EXTR: No c/c/e PSYCH: Normally interactive. Conversant.    Assessment and Plan:  This visit occurred during the SARS-CoV-2 public health emergency.  Safety protocols were in place, including screening questions prior to the visit, additional usage of staff PPE, and extensive cleaning of exam room while observing appropriate contact time as indicated for disinfecting solutions.   Signed Lamar Blinks, MD

## 2021-04-09 ENCOUNTER — Encounter: Payer: BC Managed Care – PPO | Admitting: Family Medicine

## 2021-04-15 ENCOUNTER — Encounter: Payer: Self-pay | Admitting: Occupational Therapy

## 2021-04-15 NOTE — Therapy (Signed)
[place] MAIN [facility] SERVICES [address] [place], [place], [address] Phone: [phone]   Fax:  [phone]  April 15, 2021    No Recipients  Occupational Therapy Discharge Summary   Patient: Dana Henry MRN: [id number] Date of Birth: 11-04-1980  Diagnosis: No diagnosis found.  No data recorded  The above patient had been seen in Occupational Therapy 9 times for treatment sessions.  The treatment consisted of  The patient is: Improved  Subjective: Pt. has returned to work  Functional Status at Discharge:   OT Long Term Goals - 02/20/21 [place] #1   Title Pt. will improve left grip strength to be able to open containers, and jars.    Baseline D/C:  Improved grip strength, however still limited. 02/20/2021: Pt. is improving, and is opening bottle caps. Eval: Pt. has difficulty    Time 12    Period Weeks    Status On-going    Target Date 04/15/21      OT LONG TERM GOAL #2   Title Pt. will independently, and efficiently perform typing skills in preparation for work related tasks.    Baseline D/c Pt. has resumed with typing for work related tasks. 02/20/2021: pt. has resumed typing minimally. Eval: Pt. has difficulty completing work related typing tasks.    Time 12    Period Weeks    Status On-going    Target Date 04/15/21      OT LONG TERM GOAL #3   Title Pt. will improve left hand [facility] skills in order to be able to independently hold a makeup applicator without dropping it.    Baseline D/c: Pt. Is able to hold the applicator, however had difficulty with the intricate movements needed for applying makeup. 02/20/2021: Pt. is improving with [facility] skills, is able to hold a makeup applicator, however still occassionally drops them. Eval: pt. frequently drops makeup applicators with her left hand    Time 12    Period Weeks    Status On-going    Target Date 04/15/21      OT LONG TERM GOAL #4   Title Pt. will  independently fasten small buttons efficiently on children's clothing.    Baseline D/c: Pt. has improved, however continues to have difficulty with small children's buttons. 02/20/2021: Pt. is improving, however continues to take increased time. 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    Time 12    Period Weeks    Status On-going    Target Date 04/15/21      OT LONG TERM GOAL #5   Title Pt. will independently, and efficiently stir with her left hand while baking.    Baseline D/c: Pt. Is improving. 02/20/2021: Pt. is cutting fruit.  stirring rythm continues to be incoordinated. Eval: Pt. has difficulty with LUE motor control while stirring.    Time 12    Period Weeks    Status On-going    Target Date 04/15/21      OT LONG TERM GOAL #6   Title Pt. will improve LUE strength to be able to place items on higher shelves.    Baseline D/c Improved UE strength for reaching. 02/20/2021: Pt. is improving with placing items on higher shelves. Eval: Pt. has difficulty placing weighted items onto higher shelves.    Time 12    Period Weeks    Status On-going      OT LONG TERM GOAL #  7   Title Pt. will demonstrate significantly relevant FOTO score to promote improved ADL/IADL independence    Baseline 02/20/2021: 68    Time 12    Period Weeks    Status On-going    Target Date 04/15/21            Sincerely,  Harrel Carina, MS, OTR/L  CC No Recipients  [place] MAIN [facility] SERVICES [address] [place], [place], [address] Phone: [phone]   Fax:  [phone]  Patient: Dana Henry MRN: [id number] Date of Birth: 1981-02-20

## 2021-04-24 ENCOUNTER — Other Ambulatory Visit: Payer: Self-pay

## 2021-04-24 ENCOUNTER — Ambulatory Visit
Admission: EM | Admit: 2021-04-24 | Discharge: 2021-04-24 | Disposition: A | Discharge status: Home / Self Care | Payer: BC Managed Care – PPO | Attending: Family | Admitting: Family

## 2021-04-24 DIAGNOSIS — Z20822 Contact with and (suspected) exposure to covid-19: Secondary | ICD-10-CM | POA: Insufficient documentation

## 2021-04-24 DIAGNOSIS — Z7982 Long term (current) use of aspirin: Secondary | ICD-10-CM | POA: Diagnosis not present

## 2021-04-24 DIAGNOSIS — Z8673 Personal history of transient ischemic attack (TIA), and cerebral infarction without residual deficits: Secondary | ICD-10-CM | POA: Insufficient documentation

## 2021-04-24 DIAGNOSIS — Z8616 Personal history of COVID-19: Secondary | ICD-10-CM | POA: Diagnosis not present

## 2021-04-24 DIAGNOSIS — Z882 Allergy status to sulfonamides status: Secondary | ICD-10-CM | POA: Diagnosis not present

## 2021-04-24 DIAGNOSIS — Z98891 History of uterine scar from previous surgery: Secondary | ICD-10-CM | POA: Diagnosis not present

## 2021-04-24 DIAGNOSIS — Z79899 Other long term (current) drug therapy: Secondary | ICD-10-CM | POA: Insufficient documentation

## 2021-04-24 DIAGNOSIS — R0981 Nasal congestion: Secondary | ICD-10-CM | POA: Diagnosis not present

## 2021-04-24 DIAGNOSIS — R059 Cough, unspecified: Secondary | ICD-10-CM | POA: Diagnosis not present

## 2021-04-24 MED ORDER — ALBUTEROL SULFATE HFA 108 (90 BASE) MCG/ACT IN AERS: 2.0000 | INHALATION_SPRAY | Freq: Four times a day (QID) | RESPIRATORY_TRACT | 0 refills | Status: DC | PRN

## 2021-04-24 NOTE — Discharge Instructions (Addendum)
Recommend start Albuterol inhaler 2 puffs every 6 hours as needed for cough or wheezing. Continue Mucinex as directed and Delsym as needed for cough. Continue to push fluids to help loosen up mucus in chest. Rest. Stay at home. Follow-up pending COVID 19 test results and in 4 to 5 days if not improving.

## 2021-04-24 NOTE — ED Provider Notes (Signed)
[facility]    CSN: [id number] Arrival date & time: 04/24/21  1015      History   Chief Complaint Chief Complaint  Patient presents with   Cough   Nasal Congestion    HPI Dana Henry is a 40 y.o. female.   40 year old female presents with nasal congestion, fatigue and headache that started about 5 to 6 days ago. Denies any fever or GI symptoms. 3 to 4 days ago developed a cough and recently started coughing up more mucus with small amount of blood in sputum. Concerned over possible bronchitis. Has been taking Mucinex and Delsym with some relief with cough at night. Took 2 home COVID 19 tests which were negative. No known exposure to COVID 19. Other chronic health issues include migraines, recent CVA (January 2022 after COVID 19 infection) and anemia. Currently on Lipitor, aspirin, and Brilinta daily and Tylenol prn.   The history is provided by the patient.   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   Chronic tension-type headache, not intractable 01/09/2021   Class 1 obesity due to excess calories with serious comorbidity and body mass index (BMI) of 30.0 to 30.9 in adult    Slow transit constipation    Hypoalbuminemia due to protein-calorie malnutrition (HCC)    Marijuana abuse    Left hemiparesis ([place])    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   Dysphagia, post-stroke    Acute blood loss anemia    Stroke (cerebrum) ([place]) 11/13/2020   Internal carotid artery stenosis, right 11/13/2020   Migraines 10/06/2012    Past Surgical History:  Procedure  Laterality Date   BUBBLE STUDY  11/18/2020   Procedure: BUBBLE STUDY;  Surgeon: Elouise Munroe, MD;  Location: [place];  Service: Cardiology;;   CESAREAN SECTION N/A 07/21/2014   Procedure: CESAREAN SECTION;  Surgeon: Elveria Royals, MD;  Location: [facility];  Service: Obstetrics;  Laterality: N/A;   CESAREAN SECTION N/A 08/10/2017   Procedure: Repeat CESAREAN SECTION;  Surgeon: Azucena Fallen, MD;  Location: [place];  Service: Obstetrics;  Laterality: N/A;  EDD: 08/17/17 Allergy: Sulfa   IR ANGIO INTRA EXTRACRAN SEL COM CAROTID INNOMINATE BILAT MOD SED  01/16/2021   IR CT HEAD LTD  11/13/2020   IR INTRA CRAN STENT  11/13/2020   IR INTRA CRAN STENT  02/05/2021   IR PERCUTANEOUS ART THROMBECTOMY/INFUSION INTRACRANIAL INC DIAG ANGIO  11/13/2020   IR RADIOLOGIST EVAL & MGMT  01/21/2021   IR RADIOLOGIST EVAL & MGMT  02/22/2021   IR TRANSCATH/EMBOLIZ  02/05/2021   IR US GUIDE VASC ACCESS RIGHT  02/05/2021   KIDNEY STONE SURGERY     RADIOLOGY WITH ANESTHESIA N/A 11/13/2020   Procedure: IR WITH ANESTHESIA;  Surgeon: Radiologist, Medication, MD;  Location: [place];  Service: Radiology;  Laterality: N/A;   RADIOLOGY WITH ANESTHESIA N/A 02/05/2021   Procedure: MRI WITH ANESTHESIA EMBOLIZATION;  Surgeon: Luanne Bras, MD;  Location: [place];  Service: Radiology;  Laterality: N/A;   TEE WITHOUT CARDIOVERSION N/A  11/18/2020   Procedure: TRANSESOPHAGEAL ECHOCARDIOGRAM (TEE);  Surgeon: Elouise Munroe, MD;  Location: [place];  Service: Cardiology;  Laterality: N/A;   TONSILLECTOMY AND ADENOIDECTOMY  1991    OB History     Gravida  2   Para  2   Term  2   Preterm      AB      Living  2      SAB      IAB      Ectopic      Multiple  0   Live Births  2            Home Medications    Prior to Admission medications   Medication Sig Start Date End Date Taking? Authorizing Provider  acetaminophen (TYLENOL) 500 MG tablet Take 1,000 mg by mouth every 6 (six) hours as  needed for moderate pain.   Yes [provider]  albuterol (VENTOLIN HFA) 108 (90 Base) MCG/ACT inhaler Inhale 2 puffs into the lungs every 6 (six) hours as needed for wheezing (or cough). 04/24/21  Yes Jazlyne Gauger, Nicholes Stairs, NP  aspirin 81 MG chewable tablet Chew 1 tablet (81 mg total) by mouth daily. 11/21/20  Yes Angiulli, Lavon Paganini, PA-C  atorvastatin (LIPITOR) 40 MG tablet Take 1 tablet (40 mg total) by mouth daily. 12/23/20  Yes Copland, Gay Filler, MD  Multiple Vitamins-Minerals (MULTIVITAMIN WITH MINERALS) tablet Take 1 tablet by mouth daily.   Yes [provider]  ticagrelor (BRILINTA) 90 MG TABS tablet Take 1 tablet (90 mg total) by mouth 2 (two) times daily. Patient taking differently: Take 45 mg by mouth 2 (two) times daily. PER DR. Estanislado Pandy 02/04/21  Yes Garvin Fila, MD  polyethylene glycol (MIRALAX / GLYCOLAX) 17 g packet Take 17 g by mouth 2 (two) times daily. 11/21/20   Angiulli, Lavon Paganini, PA-C    Family History Family History  Problem Relation Age of Onset   Hypertension Mother    Alcohol abuse Father     Social History Social History   Tobacco Use   Smoking status: Former    Packs/day: 0.50    Pack years: 0.00    Types: Cigarettes    Quit date: 10/19/2013    Years since quitting: 7.5   Smokeless tobacco: Never  Vaping Use   Vaping Use: Never used  Substance Use Topics   Alcohol use: Yes    Comment: occassional   Drug use: No     Allergies   Sulfa antibiotics   Review of Systems Review of Systems  Constitutional:  Positive for fatigue. Negative for activity change, appetite change, chills and fever.  HENT:  Positive for congestion, postnasal drip, sinus pressure and sore throat (irritated). Negative for ear discharge, ear pain, mouth sores, nosebleeds, rhinorrhea, sinus pain and trouble swallowing.   Eyes:  Negative for pain, discharge, redness and itching.  Respiratory:  Positive for cough. Negative for chest tightness, shortness of breath and  wheezing.   Gastrointestinal:  Negative for diarrhea, nausea and vomiting.  Musculoskeletal:  Negative for arthralgias, myalgias, neck pain and neck stiffness.  Skin:  Negative for color change and rash.  Allergic/Immunologic: Negative for environmental allergies, food allergies and immunocompromised state.  Neurological:  Positive for headaches. Negative for dizziness, seizures, syncope, weakness, light-headedness and numbness.  Hematological:  Negative for adenopathy. Bruises/bleeds easily.    Physical Exam Triage Vital Signs ED Triage Vitals  Enc Vitals Group     BP 04/24/21 1140 111/80  Pulse Rate 04/24/21 1140 66     Resp 04/24/21 1140 18     Temp 04/24/21 1140 98.2 F (36.8 C)     Temp Source 04/24/21 1140 Oral     SpO2 04/24/21 1140 100 %     Weight 04/24/21 1137 170 lb (77.1 kg)     Height 04/24/21 1137 5\' 4"  (1.626 m)     Head Circumference --      Peak Flow --      Pain Score 04/24/21 1137 0     Pain Loc --      Pain Edu? --      Excl. in [place]? --    No data found.  Updated Vital Signs BP 111/80 (BP Location: Left Arm)   Pulse 66   Temp 98.2 F (36.8 C) (Oral)   Resp 18   Ht 5\' 4"  (1.626 m)   Wt 170 lb (77.1 kg)   LMP 04/10/2021 (Approximate)   SpO2 100%   BMI 29.18 kg/m   Visual Acuity Right Eye Distance:   Left Eye Distance:   Bilateral Distance:    Right Eye Near:   Left Eye Near:    Bilateral Near:     Physical Exam Vitals and nursing note reviewed.  Constitutional:      General: She is awake. She is not in acute distress.    Appearance: She is well-developed and well-groomed.     Comments: She is sitting comfortably in the exam chair in no acute distress but appears tired and ill.   HENT:     Head: Normocephalic and atraumatic.     Right Ear: Hearing, tympanic membrane, ear canal and external ear normal.     Left Ear: Hearing, tympanic membrane, ear canal and external ear normal.     Nose: Congestion present.     Right Sinus: No  maxillary sinus tenderness or frontal sinus tenderness.     Left Sinus: No maxillary sinus tenderness or frontal sinus tenderness.     Mouth/Throat:     Lips: Pink.     Mouth: Mucous membranes are moist.     Pharynx: Oropharynx is clear. Uvula midline. Posterior oropharyngeal erythema present. No pharyngeal swelling, oropharyngeal exudate or uvula swelling.  Eyes:     Extraocular Movements: Extraocular movements intact.     Conjunctiva/sclera: Conjunctivae normal.  Cardiovascular:     Rate and Rhythm: Normal rate and regular rhythm.     Heart sounds: Normal heart sounds. No murmur heard. Pulmonary:     Effort: Pulmonary effort is normal. No prolonged expiration or respiratory distress.     Breath sounds: Normal air entry. No stridor or decreased air movement. Examination of the right-upper field reveals wheezing. Examination of the left-upper field reveals wheezing. Wheezing (slight in upper lobes, especially with coughing) present. No decreased breath sounds, rhonchi or rales.  Musculoskeletal:        General: Normal range of motion.     Cervical back: Normal range of motion and neck supple.  Lymphadenopathy:     Cervical: No cervical adenopathy.  Skin:    General: Skin is warm and dry.     Capillary Refill: Capillary refill takes less than 2 seconds.     Findings: No rash.  Neurological:     General: No focal deficit present.     Mental Status: She is alert and oriented to person, place, and time.  Psychiatric:        Mood and Affect: Mood normal.  Behavior: Behavior normal. Behavior is cooperative.        Thought Content: Thought content normal.        Judgment: Judgment normal.     UC Treatments / Results  Labs (all labs ordered are listed, but only abnormal results are displayed) Labs Reviewed  SARS CORONAVIRUS 2 (TAT 6-24 HRS)    EKG   Radiology No results found.  Procedures Procedures (including critical care time)  Medications Ordered in  UC Medications - No data to display  Initial Impression / Assessment and Plan / UC Course  I have reviewed the triage vital signs and the nursing notes.  Pertinent labs & imaging results that were available during my care of the patient were reviewed by me and considered in my medical decision making (see chart for details).     Reviewed with patient slight wheezes heard in upper lobes, mainly with coughing but no rhonchi heard. Vitals are normal and stable. Slight blood seen by patient in sputum probably due to irritation of superficial capillaries from coughing. Discussed that she probably has a viral upper respiratory illness- do not feel she has bronchitis or pneumonia. Will confirm home negative COVID 19 test with lab test today. Recommend continue Mucinex as directed. Continue Delsym at night as needed for cough. May use Albuterol inhaler 2 puffs every 6 hours as needed for wheezing or cough. Rest. Continue to push fluids to help loosen up any mucus in chest. Follow-up pending COVID 19 test results and in 4 to 5 days if not improving.  Final Clinical Impressions(s) / UC Diagnoses   Final diagnoses:  Cough  Nasal congestion     Discharge Instructions      Recommend start Albuterol inhaler 2 puffs every 6 hours as needed for cough or wheezing. Continue Mucinex as directed and Delsym as needed for cough. Continue to push fluids to help loosen up mucus in chest. Rest. Stay at home. Follow-up pending COVID 19 test results and in 4 to 5 days if not improving.      ED Prescriptions     Medication Sig Dispense Auth. Provider   albuterol (VENTOLIN HFA) 108 (90 Base) MCG/ACT inhaler Inhale 2 puffs into the lungs every 6 (six) hours as needed for wheezing (or cough). 18 g Katy Apo, NP      PDMP not reviewed this encounter.   Katy Apo, NP 04/24/21 2101

## 2021-04-24 NOTE — ED Triage Notes (Signed)
Pt c/o  nasal congestion and cough that has been progressing over about the last 6 days. Pt has been taking Mucinex and Delsym with some improvement. Pt denies f/n/v/d or other symptoms.

## 2021-04-25 LAB — SARS CORONAVIRUS 2 (TAT 6-24 HRS): SARS Coronavirus 2: NEGATIVE

## 2021-05-06 ENCOUNTER — Other Ambulatory Visit: Payer: Self-pay

## 2021-05-06 ENCOUNTER — Ambulatory Visit (INDEPENDENT_AMBULATORY_CARE_PROVIDER_SITE_OTHER): Payer: BC Managed Care – PPO

## 2021-05-06 ENCOUNTER — Ambulatory Visit
Admission: RE | Admit: 2021-05-06 | Discharge: 2021-05-06 | Disposition: A | Discharge status: Home / Self Care | Payer: BC Managed Care – PPO | Source: Ambulatory Visit | Attending: Family Medicine | Admitting: Family Medicine

## 2021-05-06 VITALS — BP 111/64 | HR 72 | Temp 98.3°F | Resp 18 | Ht 64.0 in | Wt 170.0 lb

## 2021-05-06 DIAGNOSIS — R059 Cough, unspecified: Secondary | ICD-10-CM

## 2021-05-06 DIAGNOSIS — J40 Bronchitis, not specified as acute or chronic: Secondary | ICD-10-CM

## 2021-05-06 IMAGING — CR DG CHEST 2V
2 series · 2 of 2 positions shown · non-contrast
Comparison: [DATE]

CLINICAL DATA: Cough for 1 month

EXAM:
CHEST - 2 VIEW

[chest pa]
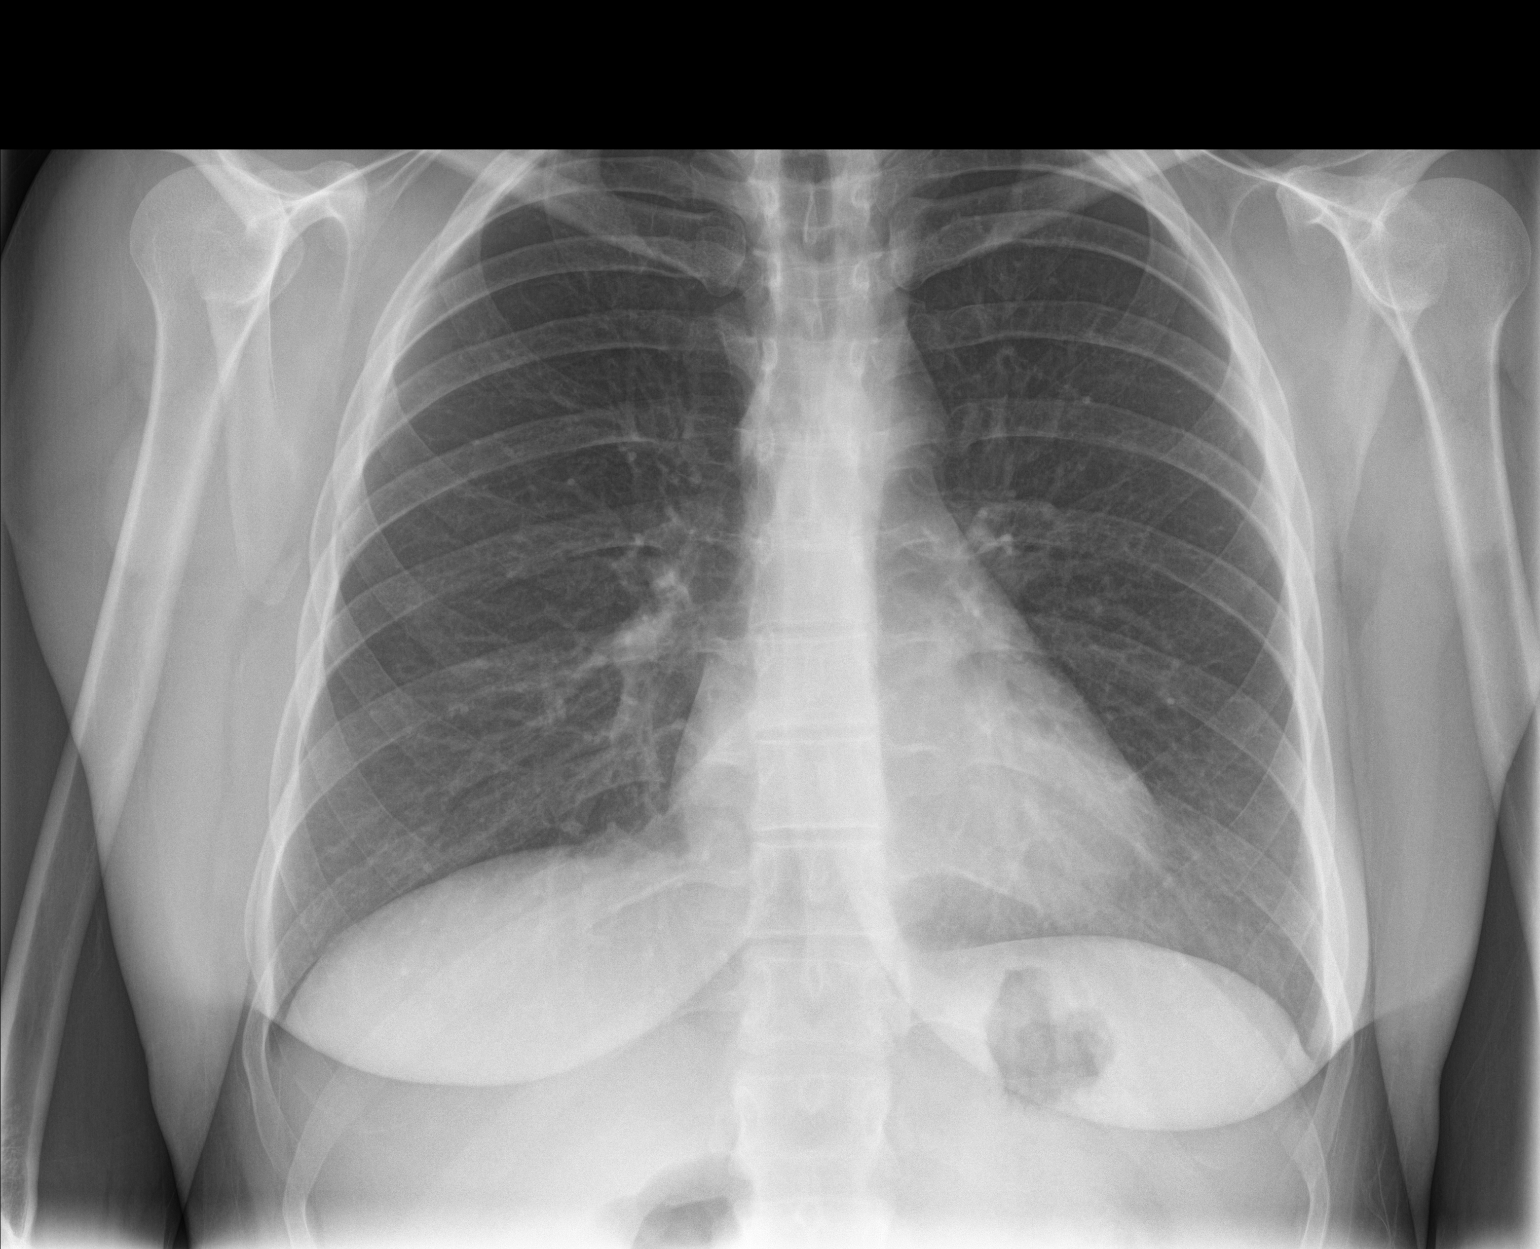

[chest lat]
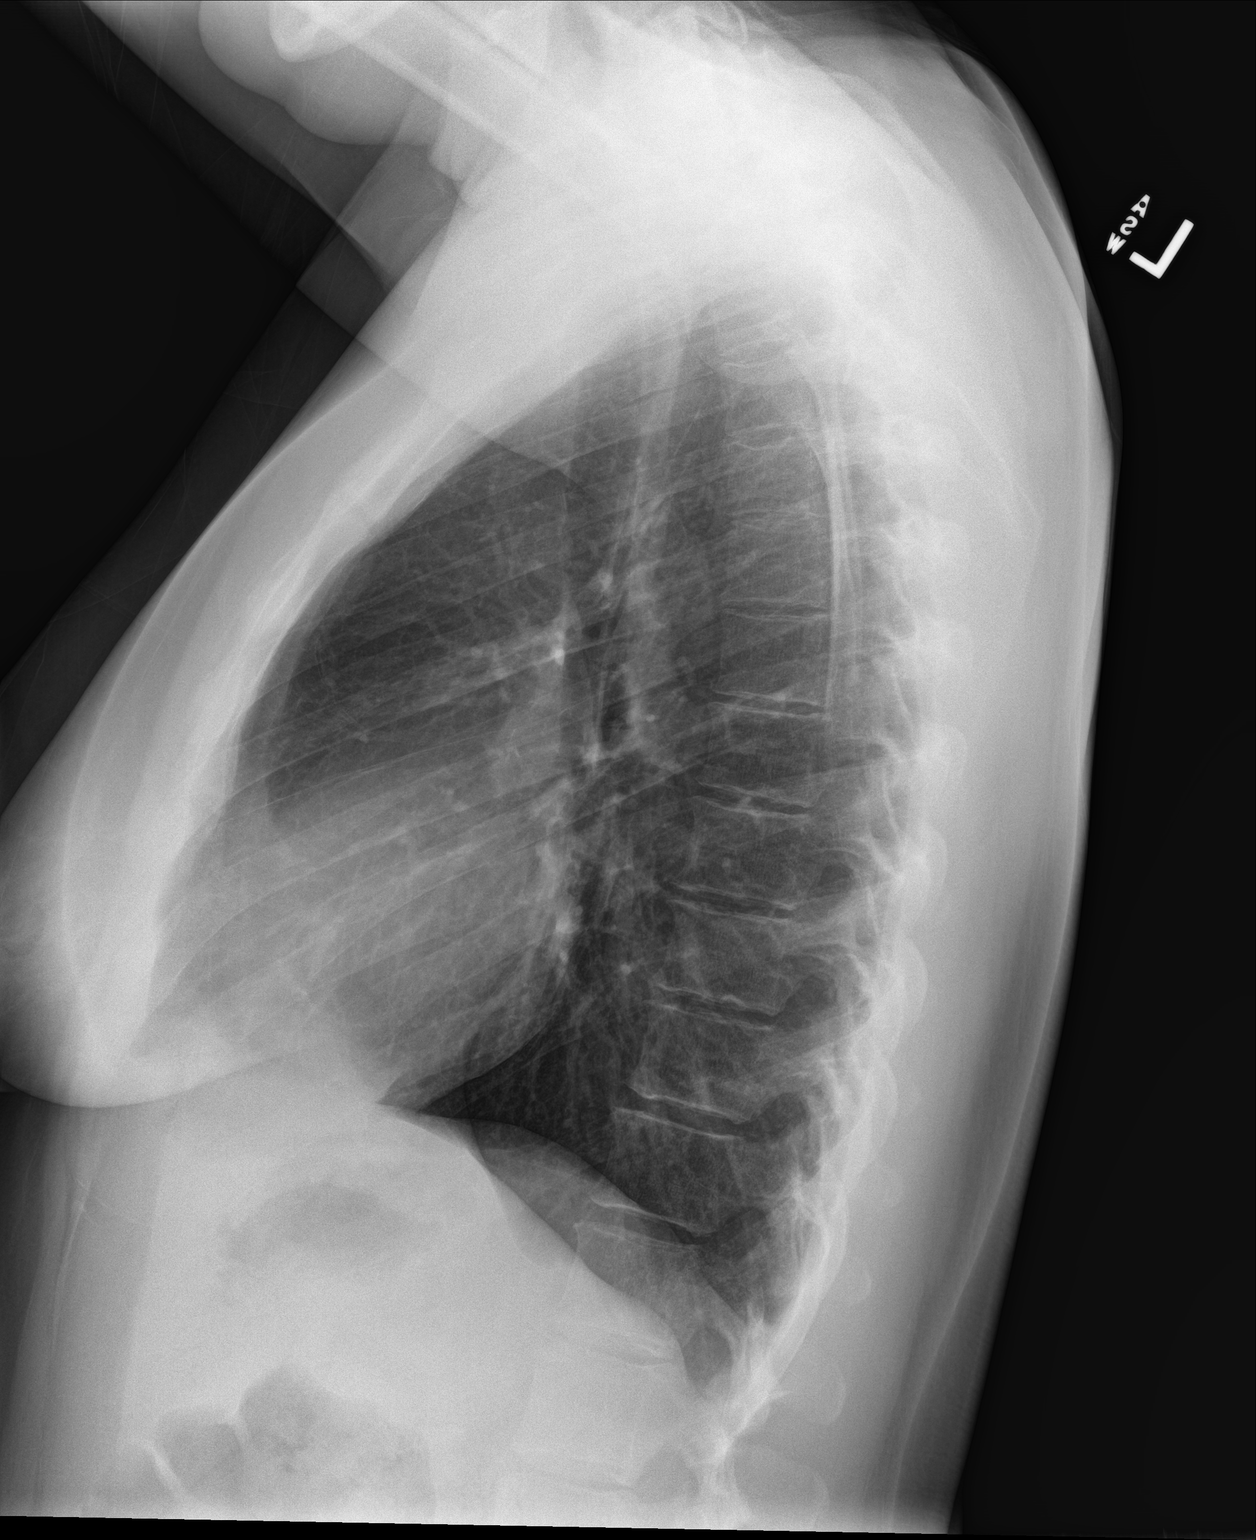

[2 of 2 positions shown; findings below may reference images not displayed]

FINDINGS: Frontal and lateral views of the chest demonstrate an unremarkable
cardiac silhouette. No acute airspace disease, effusion, or
pneumothorax. No acute bony abnormalities.
IMPRESSION: 1. No acute intrathoracic process.

## 2021-05-06 MED ORDER — DOXYCYCLINE HYCLATE 100 MG PO CAPS
100.0000 mg | ORAL_CAPSULE | Freq: Two times a day (BID) | ORAL | 0 refills | Status: DC
Start: 2021-05-06 — End: 2021-09-10

## 2021-05-06 MED ORDER — PROMETHAZINE-DM 6.25-15 MG/5ML PO SYRP: 5.0000 mL | ORAL_SOLUTION | Freq: Four times a day (QID) | ORAL | 0 refills | Status: DC | PRN

## 2021-05-06 NOTE — ED Provider Notes (Signed)
[facility]    CSN: [id number] Arrival date & time: 05/06/21  1347      History   Chief Complaint Chief Complaint  Patient presents with   Appointment   Cough   HPI  40 year old female presents with the above complaint.  Patient reports ongoing cough for the past 3 to 4 weeks.  Patient was seen here previously on 7/7.  COVID testing negative.  Patient reports that she continues to have cough which is quite troublesome.  Worse at night.  Also has sore throat.  No fever.  No relieving factors.  Patient is concerned that she may have pneumonia.  No shortness of breath.  No other associated symptoms.  No other complaints.  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   Chronic tension-type headache, not intractable 01/09/2021   Class 1 obesity due to excess calories with serious comorbidity and body mass index (BMI) of 30.0 to 30.9 in adult    Slow transit constipation    Hypoalbuminemia due to protein-calorie malnutrition (HCC)    Marijuana abuse    Left hemiparesis ([place])    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   Dysphagia, post-stroke    Acute blood loss anemia    Stroke (cerebrum) ([place]) 11/13/2020   Internal carotid artery stenosis, right 11/13/2020   Migraines 10/06/2012    Past Surgical History:  Procedure Laterality Date   BUBBLE STUDY  11/18/2020   Procedure: BUBBLE STUDY;  Surgeon: Elouise Munroe, MD;  Location: [place];  Service: Cardiology;;   CESAREAN SECTION N/A 07/21/2014   Procedure: CESAREAN SECTION;  Surgeon: Elveria Royals, MD;   Location: [facility];  Service: Obstetrics;  Laterality: N/A;   CESAREAN SECTION N/A 08/10/2017   Procedure: Repeat CESAREAN SECTION;  Surgeon: Azucena Fallen, MD;  Location: [place];  Service: Obstetrics;  Laterality: N/A;  EDD: 08/17/17 Allergy: Sulfa   IR ANGIO INTRA EXTRACRAN SEL COM CAROTID INNOMINATE BILAT MOD SED  01/16/2021   IR CT HEAD LTD  11/13/2020   IR INTRA CRAN STENT  11/13/2020   IR INTRA CRAN STENT  02/05/2021   IR PERCUTANEOUS ART THROMBECTOMY/INFUSION INTRACRANIAL INC DIAG ANGIO  11/13/2020   IR RADIOLOGIST EVAL & MGMT  01/21/2021   IR RADIOLOGIST EVAL & MGMT  02/22/2021   IR TRANSCATH/EMBOLIZ  02/05/2021   IR US GUIDE VASC ACCESS RIGHT  02/05/2021   KIDNEY STONE SURGERY     RADIOLOGY WITH ANESTHESIA N/A 11/13/2020   Procedure: IR WITH ANESTHESIA;  Surgeon: Radiologist, Medication, MD;  Location: [place];  Service: Radiology;  Laterality: N/A;   RADIOLOGY WITH ANESTHESIA N/A 02/05/2021   Procedure: MRI WITH ANESTHESIA EMBOLIZATION;  Surgeon: Luanne Bras, MD;  Location: [place];  Service: Radiology;  Laterality: N/A;   TEE WITHOUT CARDIOVERSION N/A 11/18/2020   Procedure: TRANSESOPHAGEAL ECHOCARDIOGRAM (TEE);  Surgeon: Elouise Munroe, MD;  Location: [place];  Service: Cardiology;  Laterality: N/A;   TONSILLECTOMY AND ADENOIDECTOMY  1991    OB History     Gravida  2  Para  2   Term  2   Preterm      AB      Living  2      SAB      IAB      Ectopic      Multiple  0   Live Births  2            Home Medications    Prior to Admission medications   Medication Sig Start Date End Date Taking? Authorizing Provider  acetaminophen (TYLENOL) 500 MG tablet Take 1,000 mg by mouth every 6 (six) hours as needed for moderate pain.   Yes [provider]  albuterol (VENTOLIN HFA) 108 (90 Base) MCG/ACT inhaler Inhale 2 puffs into the lungs every 6 (six) hours as needed for wheezing (or cough). 04/24/21  Yes Amyot, Nicholes Stairs, NP  aspirin 81 MG  chewable tablet Chew 1 tablet (81 mg total) by mouth daily. 11/21/20  Yes Angiulli, Lavon Paganini, PA-C  atorvastatin (LIPITOR) 40 MG tablet Take 1 tablet (40 mg total) by mouth daily. 12/23/20  Yes Copland, Gay Filler, MD  doxycycline (VIBRAMYCIN) 100 MG capsule Take 1 capsule (100 mg total) by mouth 2 (two) times daily. 05/06/21  Yes Panagiota Perfetti, Barnie Del, DO  Multiple Vitamins-Minerals (MULTIVITAMIN WITH MINERALS) tablet Take 1 tablet by mouth daily.   Yes [provider]  polyethylene glycol (MIRALAX / GLYCOLAX) 17 g packet Take 17 g by mouth 2 (two) times daily. 11/21/20  Yes Angiulli, Lavon Paganini, PA-C  promethazine-dextromethorphan (PROMETHAZINE-DM) 6.25-15 MG/5ML syrup Take 5 mLs by mouth 4 (four) times daily as needed for cough. 05/06/21  Yes Juanna Pudlo, Barnie Del, DO  ticagrelor (BRILINTA) 90 MG TABS tablet Take 1 tablet (90 mg total) by mouth 2 (two) times daily. Patient taking differently: Take 45 mg by mouth 2 (two) times daily. PER DR. Estanislado Pandy 02/04/21  Yes Garvin Fila, MD    Family History Family History  Problem Relation Age of Onset   Hypertension Mother    Alcohol abuse Father     Social History Social History   Tobacco Use   Smoking status: Former    Packs/day: 0.50    Types: Cigarettes    Quit date: 10/19/2013    Years since quitting: 7.5   Smokeless tobacco: Never  Vaping Use   Vaping Use: Never used  Substance Use Topics   Alcohol use: Yes    Comment: occassional   Drug use: No     Allergies   Sulfa antibiotics   Review of Systems Review of Systems  Constitutional:  Negative for fever.  HENT:  Positive for sore throat.   Respiratory:  Positive for cough.     Physical Exam Triage Vital Signs ED Triage Vitals  Enc Vitals Group     BP 05/06/21 1402 111/64     Pulse Rate 05/06/21 1402 72     Resp 05/06/21 1402 18     Temp 05/06/21 1402 98.3 F (36.8 C)     Temp Source 05/06/21 1402 Oral     SpO2 05/06/21 1402 100 %     Weight 05/06/21 1403 169 lb 15.6 oz (77.1  kg)     Height 05/06/21 1403 5\' 4"  (1.626 m)     Head Circumference --      Peak Flow --      Pain Score 05/06/21 1402 0     Pain Loc --      Pain Edu? --  Excl. in [place]? --    Updated Vital Signs BP 111/64 (BP Location: Right Arm)   Pulse 72   Temp 98.3 F (36.8 C) (Oral)   Resp 18   Ht 5\' 4"  (1.626 m)   Wt 77.1 kg   LMP 04/29/2021 (Approximate)   SpO2 100%   Breastfeeding No   BMI 29.18 kg/m   Visual Acuity Right Eye Distance:   Left Eye Distance:   Bilateral Distance:    Right Eye Near:   Left Eye Near:    Bilateral Near:     Physical Exam Vitals and nursing note reviewed.  Constitutional:      General: She is not in acute distress.    Appearance: Normal appearance. She is not ill-appearing.  HENT:     Head: Normocephalic and atraumatic.  Eyes:     General:        Right eye: No discharge.        Left eye: No discharge.     Conjunctiva/sclera: Conjunctivae normal.  Cardiovascular:     Rate and Rhythm: Normal rate and regular rhythm.  Pulmonary:     Effort: Pulmonary effort is normal.     Breath sounds: Normal breath sounds. No wheezing or rales.  Neurological:     Mental Status: She is alert.  Psychiatric:        Mood and Affect: Mood normal.        Behavior: Behavior normal.     UC Treatments / Results  Labs (all labs ordered are listed, but only abnormal results are displayed) Labs Reviewed - No data to display  EKG   Radiology DG Chest 2 View  Result Date: 05/06/2021 CLINICAL DATA:  Cough for 1 month EXAM: CHEST - 2 VIEW COMPARISON:  11/13/2020 FINDINGS: Frontal and lateral views of the chest demonstrate an unremarkable cardiac silhouette. No acute airspace disease, effusion, or pneumothorax. No acute bony abnormalities. IMPRESSION: 1. No acute intrathoracic process. Electronically Signed   By: Randa Ngo M.D.   On: 05/06/2021 15:14    Procedures Procedures (including critical care time)  Medications Ordered in UC Medications - No  data to display  Initial Impression / Assessment and Plan / UC Course  I have reviewed the triage vital signs and the nursing notes.  Pertinent labs & imaging results that were available during my care of the patient were reviewed by me and considered in my medical decision making (see chart for details).    40 year old female presents with cough.  Chest x-ray was obtained given the duration of her cough.  Chest x-ray was independently reviewed by me.  Interpretation: Normal chest x-ray.  No evidence of pneumonia.  Given duration of symptoms and lack of improvement, treating for acute bronchitis with doxycycline.  Promethazine DM for cough.  Final Clinical Impressions(s) / UC Diagnoses   Final diagnoses:  Bronchitis   Discharge Instructions   None    ED Prescriptions     Medication Sig Dispense Auth. Provider   doxycycline (VIBRAMYCIN) 100 MG capsule Take 1 capsule (100 mg total) by mouth 2 (two) times daily. 14 capsule Likisha Alles G, DO   promethazine-dextromethorphan (PROMETHAZINE-DM) 6.25-15 MG/5ML syrup Take 5 mLs by mouth 4 (four) times daily as needed for cough. 118 mL Coral Spikes, DO      PDMP not reviewed this encounter.   Coral Spikes, [place] 05/06/21 1706

## 2021-05-06 NOTE — ED Triage Notes (Signed)
Pt c/o cough. She states it started about 2-3 weeks ago. She was seen on 04/24/21 and tested for covid (negative). She denies fever, or shortness of breath. She is concerned for pneumonia.

## 2021-05-19 ENCOUNTER — Other Ambulatory Visit: Payer: Self-pay | Admitting: Family Medicine

## 2021-07-02 ENCOUNTER — Telehealth (HOSPITAL_COMMUNITY): Payer: Self-pay

## 2021-07-02 NOTE — Telephone Encounter (Signed)
Called to schedule cta head/neck, no answer, vm full. AW  

## 2021-07-16 ENCOUNTER — Telehealth (HOSPITAL_COMMUNITY): Payer: Self-pay

## 2021-07-16 NOTE — Telephone Encounter (Signed)
Called to schedule cta head/neck, no answer, left vm. AW 

## 2021-07-24 ENCOUNTER — Ambulatory Visit: Payer: BC Managed Care – PPO | Admitting: Adult Health

## 2021-07-24 NOTE — Progress Notes (Deleted)
[facility] [address] [place]. [place] [address] [phone]       OFFICE FOLLOW-UP NOTE  [name] Date of Birth:  1980-12-10 Medical Record Number:  [id number]      HPI:   Today, 07/24/2021, Dana Henry returns for 41-month stroke follow-up.  Overall doing well.  Denies new stroke/TIA symptoms.  Residual ***.   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 Estanislado Henry without complication.  Remains on aspirin and Brilinta as well as atorvastatin tolerating without side effects.  Blood pressure today ***.       History provided for reference purposes only Initial visit 01/22/2021 Dr. Leonie Henry: Dana Henry is a pleasant 40 year old Caucasian lady seen today for initial office follow-up visit following hospital admission for stroke in January 2022.  History is obtained from the patient, review of electronic medical records and I personally reviewed pertinent imaging imaging films in PACS.  She has past medical history of migraines, Covid infection in December 2021 who presented as a code stroke on 11/13/2020.  She complained of left-sided numbness and weakness while at [facility] along with heaviness.  She was able to walk and husband was sick so she did not want to bother him.  Next day her symptoms got worse with gaze deviation and left facial droop and left-sided weakness and EMS was called.  CT scan of the head showed right caudate and basal ganglia subacute infarcts and CT angiogram of the head and neck showed right petrous segment occlusion versus high-grade stenosis.  CT perfusion showed a large penumbra on the right without any core infarct.  Patient underwent successful mechanical thrombectomy of the occluded right middle cerebral artery with  TICI 3 revascularization by Dr. Estanislado Henry which also needed rescue right angioplasty and stenting.Marland Kitchen  She was admitted to the intensive care unit and blood pressure was tightly controlled.  She was extubated and  did well.  Subsequent MRI scan of the brain showed patchy multifocal acute right MCA territory infarcts involving subcortical and deep white matter of the right frontal and parietal lobes mostly watershed distribution.  MRA showed patent right petrous ICA with a stent with preserved distal flow.  2D echo showed ejection fraction of 60 to 65%.  Lower extremity venous Dopplers were negative for DVT.  Transcranial Doppler bubble study was positive for a small Dana Henry grade 2 right-to-left shunt however TEE showed a redundant atrial septum with possible color-flow but no right-to-left shunt.  LDL cholesterol is 96 mg percent.  Hemoglobin A1c was 5.1.  Urine drug screen was positive for cannabis.  Hypercoagulable panel as well as autoimmune labs were all negative.  Patient was started on aspirin and Brilinta.  She states she is done well.  She has only mild weakness in her hand with diminished fine motor skills but is able to walk well and speech is back to normal.  She is currently doing outpatient occupational therapy only and has finished physical therapy.  She is tolerating aspirin and Brilinta well with only minor bruising and no bleeding.  Blood pressures well controlled and today it is 120/68.  She was started on Prozac for anxiety by primary physician and that seems to be helping.  She had a follow-up angiogram done by Dr. Estanislado Henry on 01/17/2021 which showed no significant restenosis in the right carotid stent but there was 6 mm x 3.3 mm pseudoaneurysm at the distal portion of the stent.  There is also a small 3.6 x 2.4 mm outpouching of the  left posterior communicating artery possibly an infundibulum versus small aneurysm.  Patient is scheduled for elective pseudoaneurysm coiling and treatment by Dr. Estanislado Henry soon.  She has no new complaints.  She is tolerating Lipitor well without muscle aches and pains.  She has not had any follow-up lipid profile checked.     ROS:   14 system review of systems is  positive for hand weakness, diminished fine motor skills, bruising, anxiety and all other systems negative  PMH:  Past Medical History:  Diagnosis Date   Anemia    CVA (cerebral vascular accident) ([place])    History of cesarean delivery affecting pregnancy 08/10/2017   History of kidney stones    surgically removed   Migraines    associated with menstrual cycle   Pneumonia    UTI (urinary tract infection)    Vaginal Pap smear, abnormal     Social History:  Social History   Socioeconomic History   Marital status: Married    Spouse name: Dana Henry   Number of children: 2   Years of education: College   Highest education level: Not on file  Occupational History   Occupation: sales  Tobacco Use   Smoking status: Former    Packs/day: 0.50    Types: Cigarettes    Quit date: 10/19/2013    Years since quitting: 7.7   Smokeless tobacco: Never  Vaping Use   Vaping Use: Never used  Substance and Sexual Activity   Alcohol use: Yes    Comment: occassional   Drug use: No   Sexual activity: Yes    Birth control/protection: None  Other Topics Concern   Not on file  Social History Narrative   Lives in [place] with husband and mother and father and mother in [employer]. Dana Henry 92months, Dana Henry.      Right-handed.      Work - Manufacturing systems engineer in [employer]      Diet - regular   Exercise - occasional walks      Caffinated Beverages: Yes - 2 cups per day   Herbal Remedies: No   Seat Belts: Yes   Bike Helmet: Yes   Exercise 3 Times a Week: No   Vegetarian: No   Eat Dairy Products:   Take Vitamins: Yes   Use Hearing Aid: No   Wear Dentures: No   Smoke Alarms in Home: Yes   Guns/Firearms: No   Physical Abuse: No      Hours of Sleep: 6-7   # of People in Home: 3               Social Determinants of Health   Financial Resource Strain: Not on file  Food Insecurity: Not on file  Transportation Needs: Not on file  Physical Activity: Not on file  Stress: Not on file   Social Connections: Not on file  Intimate Partner Violence: Not on file    Medications:   Current Outpatient Medications on File Prior to Visit  Medication Sig Dispense Refill   acetaminophen (TYLENOL) 500 MG tablet Take 1,000 mg by mouth every 6 (six) hours as needed for moderate pain.     albuterol (VENTOLIN HFA) 108 (90 Base) MCG/ACT inhaler Inhale 2 puffs into the lungs every 6 (six) hours as needed for wheezing (or cough). 18 g 0   aspirin 81 MG chewable tablet Chew 1 tablet (81 mg total) by mouth daily.     atorvastatin (LIPITOR) 40 MG tablet Take 1 tablet (40 mg total) by mouth  daily. 90 tablet 3   doxycycline (VIBRAMYCIN) 100 MG capsule Take 1 capsule (100 mg total) by mouth 2 (two) times daily. 14 capsule 0   FLUoxetine (PROZAC) 20 MG tablet Take 1 tablet (20 mg total) by mouth daily. 60 tablet 5   Multiple Vitamins-Minerals (MULTIVITAMIN WITH MINERALS) tablet Take 1 tablet by mouth daily.     polyethylene glycol (MIRALAX / GLYCOLAX) 17 g packet Take 17 g by mouth 2 (two) times daily. 14 each 0   promethazine-dextromethorphan (PROMETHAZINE-DM) 6.25-15 MG/5ML syrup Take 5 mLs by mouth 4 (four) times daily as needed for cough. 118 mL 0   ticagrelor (BRILINTA) 90 MG TABS tablet Take 1 tablet (90 mg total) by mouth 2 (two) times daily. (Patient taking differently: Take 45 mg by mouth 2 (two) times daily. PER DR. Estanislado Henry) 60 tablet 5   No current facility-administered medications on file prior to visit.    Allergies:   Allergies  Allergen Reactions   Sulfa Antibiotics Hives and Swelling    Physical Exam There were no vitals filed for this visit. There is no height or weight on file to calculate BMI.   General: well developed, well nourished, seated, in no evident distress Head: head normocephalic and atraumatic.  Neck: supple with no carotid or supraclavicular bruits Cardiovascular: regular rate and rhythm, no murmurs Musculoskeletal: no deformity Skin:  no  rash/petichiae Vascular:  Normal pulses all extremities  Neurologic Exam Mental Status: Awake and fully alert. Oriented to place and time. Recent and remote memory intact. Attention span, concentration and fund of knowledge appropriate. Mood and affect appropriate.  Cranial Nerves: Pupils equal, briskly reactive to light. Extraocular movements full without nystagmus. Visual fields full to confrontation. Hearing intact. Facial sensation intact. Face, tongue, palate moves normally and symmetrically.  Motor: Normal bulk and tone. Normal strength in all tested extremity muscles.  Diminished fine finger movements on the left.  Orbits right over left upper extremity.  Mild left grip weakness. Sensory.: intact to touch ,pinprick .position and vibratory sensation.  Coordination: Rapid alternating movements normal in all extremities. Finger-to-nose and heel-to-shin performed accurately bilaterally. Gait and Station: Arises from chair without difficulty. Stance is normal. Gait demonstrates normal stride length and balance . Able to heel, toe and tandem walk without difficulty.  Reflexes: 1+ and symmetric. Toes downgoing.      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  No significant vascular risk factors except mild hyper lipidemia.  Small PFO noted on TCD bubble study but not visualized on TEE. 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 Estanislado Henry without complication     PLAN: -Continue aspirin 81 mg daily and Brilinta (ticagrelor) 90 mg bid  for secondary stroke prevention and s/p pseudoaneurysm embolization.  Ongoing duration of Brilinta will be determined by Dr. Estanislado Henry -maintain strict control of hypertension with blood pressure goal below 130/90, diabetes with  hemoglobin A1c goal below 6.5% and lipids with LDL cholesterol goal below 70 mg/dL.  -Lipid panel 01/22/2021 LDL 42       CC:  [facility] provider: Copland, Gay Filler, MD   I spent *** minutes of face-to-face and non-face-to-face time with patient.  This included previsit chart review, lab review, study review, order entry, electronic health record documentation, patient education  Frann Rider, [facility]  [facility] [address] [place],  [address]  Phone [phone] Fax [phone] Note: This document was prepared with digital dictation and possible smart phrase technology. Any transcriptional errors that result from this process are unintentional.

## 2021-07-28 ENCOUNTER — Other Ambulatory Visit (HOSPITAL_COMMUNITY): Payer: Self-pay | Admitting: Interventional Radiology

## 2021-07-28 DIAGNOSIS — I729 Aneurysm of unspecified site: Secondary | ICD-10-CM

## 2021-08-05 ENCOUNTER — Ambulatory Visit (HOSPITAL_COMMUNITY)
Admission: RE | Admit: 2021-08-05 | Discharge: 2021-08-05 | Disposition: A | Discharge status: Home / Self Care | Payer: BC Managed Care – PPO | Source: Ambulatory Visit | Attending: Interventional Radiology | Admitting: Interventional Radiology

## 2021-08-05 ENCOUNTER — Encounter (HOSPITAL_COMMUNITY): Payer: Self-pay

## 2021-08-05 ENCOUNTER — Other Ambulatory Visit: Payer: Self-pay

## 2021-08-05 DIAGNOSIS — Z8673 Personal history of transient ischemic attack (TIA), and cerebral infarction without residual deficits: Secondary | ICD-10-CM | POA: Diagnosis not present

## 2021-08-05 DIAGNOSIS — I729 Aneurysm of unspecified site: Secondary | ICD-10-CM | POA: Insufficient documentation

## 2021-08-05 IMAGING — CT CT ANGIO NECK
2 of 10 series · 9 of 33 positions shown · IV contrast (omnipaque)
Comparison: [DATE] CT head and CTA head neck, correlation is
also made with [DATE] MRI

CLINICAL DATA: Follow-up from CVA in [REDACTED] with stent placement



[Series 9: cta head neck · axial · 0.43mm/px · z∈[-272,+70]mm · 3 of 172 slices shown]
[im 1/172  soft-tissue]
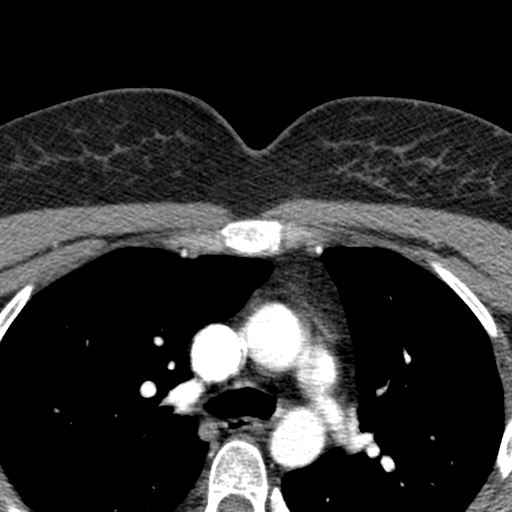
[im 86/172  bone]
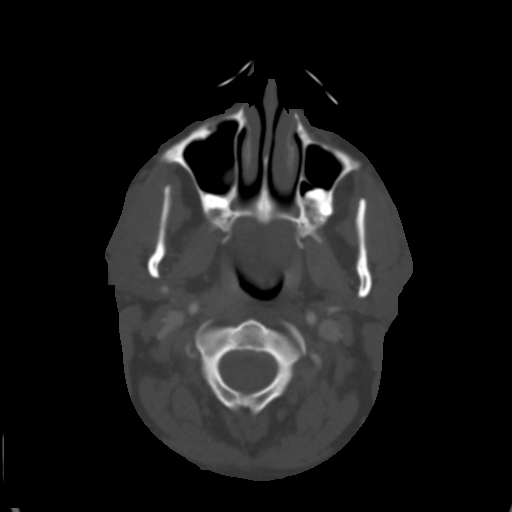
[im 172/172  soft-tissue]
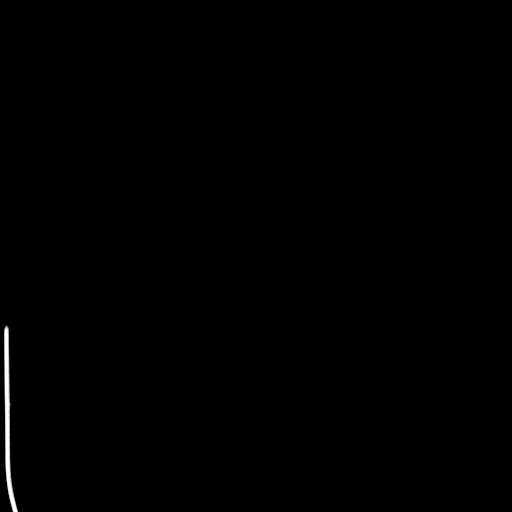

[Series 11: ax thin · axial · 0.39mm/px · z∈[-224,+17]mm · 6 of 339 slices shown]
[im 49/339  soft-tissue]
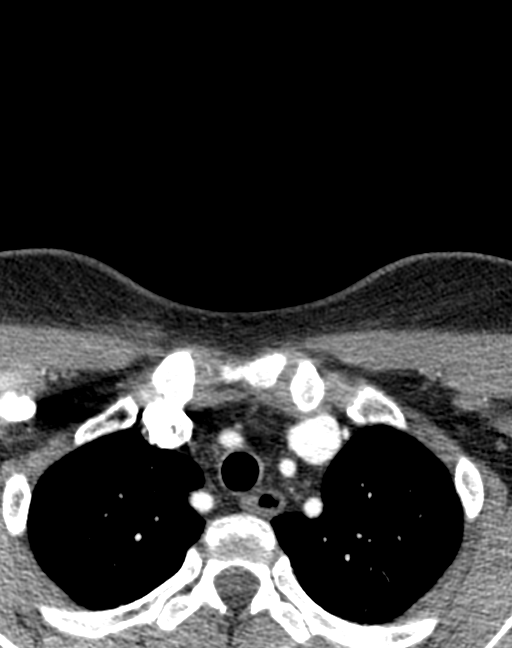
[im 97/339  soft-tissue]
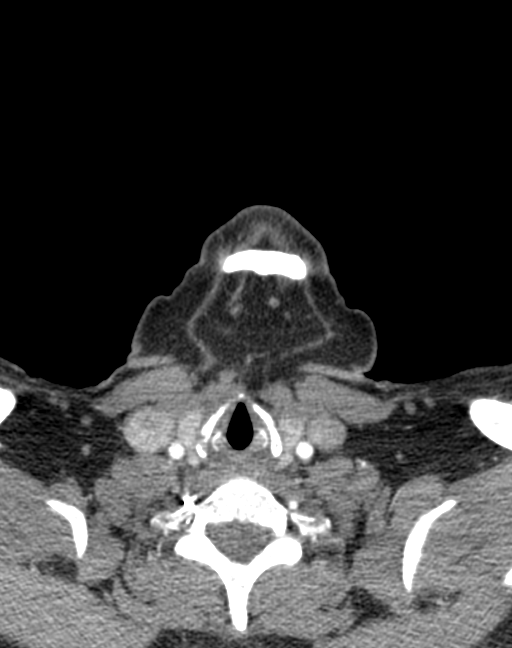
[im 145/339  soft-tissue]
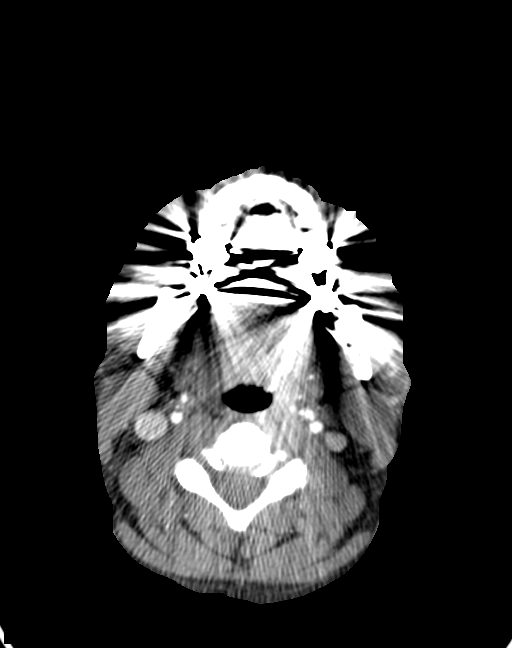
[im 194/339  soft-tissue]
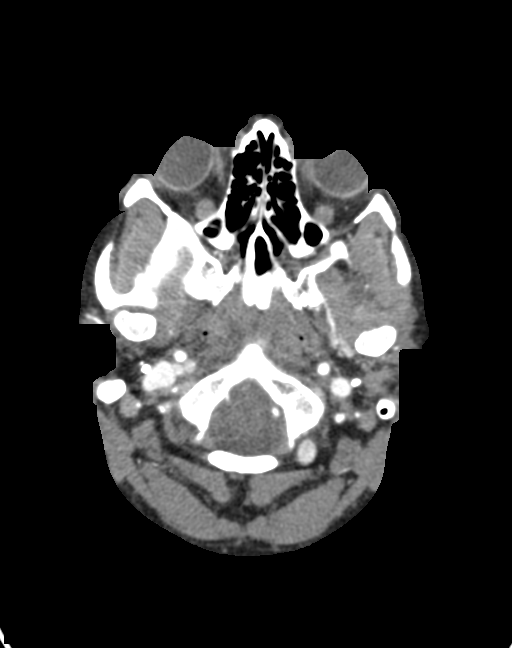
[im 242/339  soft-tissue]
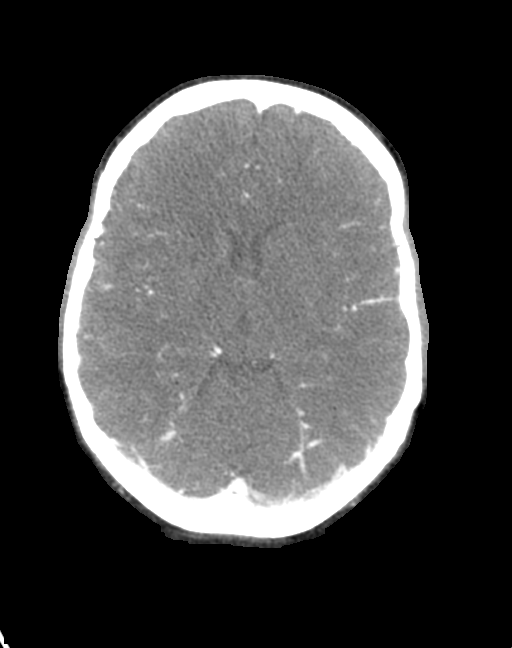
[im 290/339  soft-tissue]
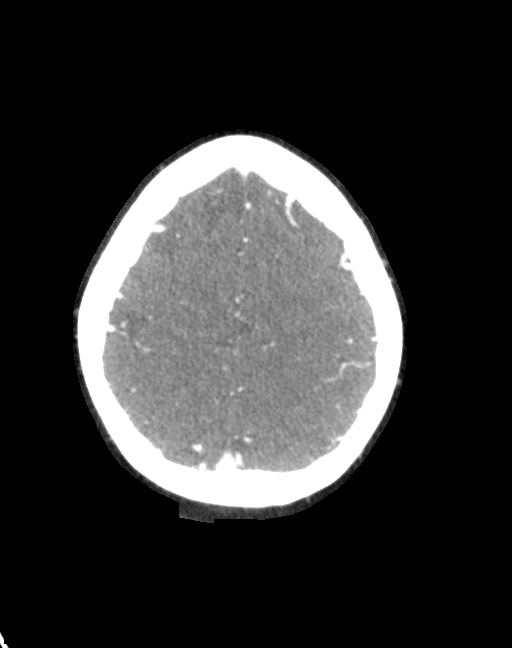

[9 of 33 positions shown; findings below may reference images not displayed]

FINDINGS: CT HEAD FINDINGS

Brain: Hypodensity in the right caudate head and corona radiata,
consistent with infarct seen on the [DATE] MRI. No evidence of
acute infarction, hemorrhage, hydrocephalus, extra-axial collection,
mass, mass effect, or midline shift.

Vascular: No hyperdense vessel.

Skull: Normal. Negative for fracture or focal lesion.

Sinuses: No acute or significant finding.

Other: The mastoids are well aerated.

CTA NECK FINDINGS

Aortic arch: 4 vessel arch with aberrant origin of the right
subclavian and aortic origin of the right common carotid artery. No
evidence of aneurysm or dissection. No significant stenosis of the
branch vessels.

Right carotid system: No evidence of dissection, stenosis (50% or
greater) or occlusion.

Left carotid system: No evidence of dissection, stenosis (50% or
greater) or occlusion.

Vertebral arteries: Left dominant. No evidence of dissection,
stenosis (50% or greater) or occlusion.

Skeleton: No acute osseous abnormality.

Other neck: Negative

Upper chest: Negative

Review of the MIP images confirms the above findings

CTA HEAD FINDINGS

Anterior circulation: Status post stent placement in the right
petrous ICA. Although beam hardening artifact limits evaluation for
in stent thrombosis, the stent does appear patent, with
opacification of the cavernous ICA and more distal segments. The
left internal carotid artery is patent to the terminus, without
stenosis or other abnormality. A1 segments patent. Normal anterior
communicating artery. Anterior cerebral arteries are patent to their
distal aspects. No M1 stenosis or occlusion. Normal MCA
bifurcations. Distal MCA branches perfused and symmetric.

Posterior circulation: Vertebral arteries patent to the
vertebrobasilar junction. Basilar is diminutive but patent to its
distal aspect. Superior cerebral arteries patent bilaterally. Fetal
origin of bilateral PCAs. PCAs perfused to their distal aspects
without stenosis.

Venous sinuses: As permitted by contrast timing, patent.

Anatomic variants: Fetal origin of the bilateral PCAs.

Review of the MIP images confirms the above findings
IMPRESSION: 1. Status post stent placement in the right petrous ICA, which
appears patent with normal caliber cavernous right ICA and more
distal segments.
2. No intracranial large vessel occlusion.
3. No hemodynamically significant stenosis in the neck.
4. No acute intracranial process. Hypodensity in the right caudate
head and corona radiata is consistent with evolution of the infarcts
seen on the [DATE] MRI.

## 2021-08-05 MED ORDER — IOHEXOL 350 MG/ML SOLN
80.0000 mL | Freq: Once | INTRAVENOUS | Status: AC | PRN
  Administered 2021-08-05: 80 mL via INTRAVENOUS

## 2021-08-11 ENCOUNTER — Telehealth (HOSPITAL_COMMUNITY): Payer: Self-pay

## 2021-08-11 NOTE — Telephone Encounter (Signed)
Pt agreed to f/u in 6 months with cta head/neck. AW 

## 2021-09-10 ENCOUNTER — Ambulatory Visit
Admission: RE | Admit: 2021-09-10 | Discharge: 2021-09-10 | Disposition: A | Discharge status: Home / Self Care | Payer: BC Managed Care – PPO | Source: Ambulatory Visit | Attending: Emergency Medicine | Admitting: Emergency Medicine

## 2021-09-10 ENCOUNTER — Other Ambulatory Visit: Payer: Self-pay

## 2021-09-10 VITALS — BP 116/68 | HR 71 | Temp 98.7°F | Resp 18 | Ht 64.0 in | Wt 170.0 lb

## 2021-09-10 DIAGNOSIS — H6983 Other specified disorders of Eustachian tube, bilateral: Secondary | ICD-10-CM | POA: Diagnosis not present

## 2021-09-10 DIAGNOSIS — H9203 Otalgia, bilateral: Secondary | ICD-10-CM

## 2021-09-10 MED ORDER — FLUTICASONE PROPIONATE 50 MCG/ACT NA SUSP: 2.0000 | Freq: Every day | NASAL | 1 refills | Status: DC

## 2021-09-10 NOTE — Discharge Instructions (Signed)
Take over-the-counter Zyrtec, Claritin, or Allegra once daily to help with allergic symptoms.  Instill 2 squirts of fluticasone in each nostril at bedtime nightly.  And the nasal away from the septum of your nose and follow each set of squirts with 1 squirt of nasal saline to push the particles up into your turbinates where they will take effect.  Continue to equalize your ears as shown to help clear mucus from eustachian tubes and maintain patency.   

## 2021-09-10 NOTE — ED Provider Notes (Signed)
[facility]    CSN: [id number] Arrival date & time: 09/10/21  1701      History   Chief Complaint Chief Complaint  Patient presents with   Appointment   Otalgia    left    HPI Dana Henry is a 40 y.o. female.   HPI  40 year old female here for evaluation of bilateral ear fullness.  Patient reports that she has been experiencing bilateral ear fullness for the past week.  She reports that she is feeling more pressure in her left ear versus her right.  She did have flu a couple weeks ago and has been experiencing residual fullness since then.  She does take Sudafed off and on with some mild relief of symptoms.  She states that her hearing is muffled but she denies any drainage from the ear, fever, or ringing in ears.  She has no runny nose or nasal congestion.  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   Chronic tension-type headache, not intractable 01/09/2021   Class 1 obesity due to excess calories with serious comorbidity and body mass index (BMI) of 30.0 to 30.9 in adult    Slow transit constipation    Hypoalbuminemia due to protein-calorie malnutrition (HCC)    Marijuana abuse    Left hemiparesis ([name])    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   Dysphagia, post-stroke    Acute blood loss anemia    Stroke (cerebrum) ([place]) 11/13/2020   Internal carotid artery stenosis, right 11/13/2020   Migraines 10/06/2012    Past Surgical History:  Procedure Laterality Date   BUBBLE STUDY  11/18/2020   Procedure: BUBBLE STUDY;  Surgeon: Elouise Munroe,  MD;  Location: [place];  Service: Cardiology;;   CESAREAN SECTION N/A 07/21/2014   Procedure: CESAREAN SECTION;  Surgeon: Elveria Royals, MD;  Location: [facility];  Service: Obstetrics;  Laterality: N/A;   CESAREAN SECTION N/A 08/10/2017   Procedure: Repeat CESAREAN SECTION;  Surgeon: Azucena Fallen, MD;  Location: [place];  Service: Obstetrics;  Laterality: N/A;  EDD: 08/17/17 Allergy: Sulfa   IR ANGIO INTRA EXTRACRAN SEL COM CAROTID INNOMINATE BILAT MOD SED  01/16/2021   IR CT HEAD LTD  11/13/2020   IR INTRA CRAN STENT  11/13/2020   IR INTRA CRAN STENT  02/05/2021   IR PERCUTANEOUS ART THROMBECTOMY/INFUSION INTRACRANIAL INC DIAG ANGIO  11/13/2020   IR RADIOLOGIST EVAL & MGMT  01/21/2021   IR RADIOLOGIST EVAL & MGMT  02/22/2021   IR TRANSCATH/EMBOLIZ  02/05/2021   IR US GUIDE VASC ACCESS RIGHT  02/05/2021   KIDNEY STONE SURGERY     RADIOLOGY WITH ANESTHESIA N/A 11/13/2020   Procedure: IR WITH ANESTHESIA;  Surgeon: Radiologist, Medication, MD;  Location: [place];  Service: Radiology;  Laterality: N/A;   RADIOLOGY WITH ANESTHESIA N/A 02/05/2021   Procedure: MRI WITH ANESTHESIA EMBOLIZATION;  Surgeon: Luanne Bras, MD;  Location: [place];  Service: Radiology;  Laterality: N/A;   TEE WITHOUT CARDIOVERSION N/A 11/18/2020   Procedure: TRANSESOPHAGEAL ECHOCARDIOGRAM (TEE);  Surgeon:  Elouise Munroe, MD;  Location: [place];  Service: Cardiology;  Laterality: N/A;   TONSILLECTOMY AND ADENOIDECTOMY  1991    OB History     Gravida  2   Para  2   Term  2   Preterm      AB      Living  2      SAB      IAB      Ectopic      Multiple  0   Live Births  2            Home Medications    Prior to Admission medications   Medication Sig Start Date End Date Taking? Authorizing Provider  aspirin 81 MG chewable tablet Chew 1 tablet (81 mg total) by mouth daily. 11/21/20  Yes Angiulli, Lavon Paganini, PA-C  atorvastatin (LIPITOR) 40 MG tablet Take 1 tablet (40 mg total) by  mouth daily. 12/23/20  Yes Copland, Gay Filler, MD  fluticasone (FLONASE) 50 MCG/ACT nasal spray Place 2 sprays into both nostrils daily. 09/10/21  Yes Margarette Canada, NP  ticagrelor (BRILINTA) 90 MG TABS tablet Take 1 tablet (90 mg total) by mouth 2 (two) times daily. Patient taking differently: Take 45 mg by mouth 2 (two) times daily. PER DR. Estanislado Pandy 02/04/21  Yes Garvin Fila, MD  acetaminophen (TYLENOL) 500 MG tablet Take 1,000 mg by mouth every 6 (six) hours as needed for moderate pain.    [provider]  Multiple Vitamins-Minerals (MULTIVITAMIN WITH MINERALS) tablet Take 1 tablet by mouth daily.    [provider]    Family History Family History  Problem Relation Age of Onset   Hypertension Mother    Alcohol abuse Father     Social History Social History   Tobacco Use   Smoking status: Former    Packs/day: 0.50    Types: Cigarettes    Quit date: 10/19/2013    Years since quitting: 7.8   Smokeless tobacco: Never  Vaping Use   Vaping Use: Never used  Substance Use Topics   Alcohol use: Yes    Comment: occassional   Drug use: No     Allergies   Sulfa antibiotics   Review of Systems Review of Systems  Constitutional:  Negative for activity change, appetite change and fever.  HENT:  Positive for ear pain and hearing loss. Negative for congestion, ear discharge and rhinorrhea.   Hematological: Negative.   Psychiatric/Behavioral: Negative.      Physical Exam Triage Vital Signs ED Triage Vitals  Enc Vitals Group     BP 09/10/21 1734 116/68     Pulse Rate 09/10/21 1734 71     Resp 09/10/21 1734 18     Temp 09/10/21 1734 98.7 F (37.1 C)     Temp Source 09/10/21 1734 Oral     SpO2 09/10/21 1734 98 %     Weight 09/10/21 1732 169 lb 15.6 oz (77.1 kg)     Height 09/10/21 1732 5\' 4"  (1.626 m)     Head Circumference --      Peak Flow --      Pain Score 09/10/21 1731 0     Pain Loc --      Pain Edu? --      Excl. in [place]? --    No data  found.  Updated Vital Signs BP 116/68 (BP Location: Left Arm)   Pulse 71   Temp 98.7 F (37.1 C) (Oral)   Resp 18  Ht 5\' 4"  (1.626 m)   Wt 169 lb 15.6 oz (77.1 kg)   LMP 09/08/2021 (Approximate)   SpO2 98%   BMI 29.18 kg/m   Visual Acuity Right Eye Distance:   Left Eye Distance:   Bilateral Distance:    Right Eye Near:   Left Eye Near:    Bilateral Near:     Physical Exam Vitals and nursing note reviewed.  Constitutional:      General: She is not in acute distress.    Appearance: Normal appearance. She is normal weight. She is not ill-appearing.  HENT:     Head: Normocephalic and atraumatic.     Right Ear: Tympanic membrane, ear canal and external ear normal. There is no impacted cerumen.     Left Ear: Tympanic membrane, ear canal and external ear normal. There is no impacted cerumen.  Cardiovascular:     Rate and Rhythm: Normal rate.     Pulses: Normal pulses.     Heart sounds: Normal heart sounds. No murmur heard.   No gallop.  Pulmonary:     Effort: Pulmonary effort is normal.     Breath sounds: Normal breath sounds. No wheezing, rhonchi or rales.  Skin:    General: Skin is warm.     Capillary Refill: Capillary refill takes less than 2 seconds.     Findings: No erythema or rash.  Neurological:     General: No focal deficit present.     Mental Status: She is alert and oriented to person, place, and time.  Psychiatric:        Mood and Affect: Mood normal.        Behavior: Behavior normal.        Thought Content: Thought content normal.        Judgment: Judgment normal.     UC Treatments / Results  Labs (all labs ordered are listed, but only abnormal results are displayed) Labs Reviewed - No data to display  EKG   Radiology No results found.  Procedures Procedures (including critical care time)  Medications Ordered in UC Medications - No data to display  Initial Impression / Assessment and Plan / UC Course  I have reviewed the triage vital  signs and the nursing notes.  Pertinent labs & imaging results that were available during my care of the patient were reviewed by me and considered in my medical decision making (see chart for details).  Patient is a very pleasant 40 year old female here for evaluation of bilateral ear fullness that has been going on for the past 1 to 2 weeks since she had the flu.  She is complaining of foot is being greater on the left than the right.  She states that her hearing is muffled but she denies any drainage from the ear, fever, dizziness, or ringing in her ear.  Physical exam reveals pearly gray tympanic membranes bilaterally with normal light reflex.  There is no effusion or erythema noted.  Both external auditory canals are clear.  There is no tenderness with palpation of the eustachian tubes.  Cardiopulmonary exam reveals clear lung sounds all fields.  Patient coached through equalizing her ears and she states that she was able to make her left ear "pop" and has an improvement of the fullness sensation.  Suspect patient has some mild residual eustachian tube dysfunction secondary to influenza.  I have encouraged patient to continue to try and equalize her ears, she can continue to use Sudafed as needed as well as  over-the-counter Zyrtec, Claritin, or Allegra to help with any residual middle ear fluid that might be contributing to her symptoms.  Also encouraged her to utilize a heating pad or hot water bottle underneath her pillowcase at night and let the heat dilate up your eustachian tube to help it drain.  We will also prescribe Flonase, 2 squirts in each nostril at bedtime, to help with eustachian tube dysfunction.   Final Clinical Impressions(s) / UC Diagnoses   Final diagnoses:  Otalgia of both ears  Eustachian tube dysfunction, bilateral     Discharge Instructions      Take over-the-counter Zyrtec, Claritin, or Allegra once daily to help with allergic symptoms.  Instill 2 squirts of  fluticasone in each nostril at bedtime nightly.  And the nasal away from the septum of your nose and follow each set of squirts with 1 squirt of nasal saline to push the particles up into your turbinates where they will take effect.  Continue to equalize your ears as shown to help clear mucus from eustachian tubes and maintain patency.       ED Prescriptions     Medication Sig Dispense Auth. Provider   fluticasone (FLONASE) 50 MCG/ACT nasal spray Place 2 sprays into both nostrils daily. 18.2 mL Margarette Canada, NP      PDMP not reviewed this encounter.   Margarette Canada, NP 09/10/21 [phone]

## 2021-09-10 NOTE — ED Triage Notes (Signed)
Pt c/o bilateral ear fulness. She states left is worse than right. She states she had flu a couple of weeks ago and has been having ear fullness since. She has been taking sudafed without relief.

## 2021-10-12 ENCOUNTER — Other Ambulatory Visit: Payer: Self-pay

## 2021-10-12 ENCOUNTER — Emergency Department (HOSPITAL_COMMUNITY): Payer: BC Managed Care – PPO

## 2021-10-12 ENCOUNTER — Emergency Department (HOSPITAL_COMMUNITY)
Admission: EM | Admit: 2021-10-12 | Discharge: 2021-10-12 | Disposition: A | Discharge status: Home / Self Care | Payer: BC Managed Care – PPO | Attending: Emergency Medicine | Admitting: Emergency Medicine

## 2021-10-12 ENCOUNTER — Encounter (HOSPITAL_COMMUNITY): Payer: Self-pay

## 2021-10-12 DIAGNOSIS — R29818 Other symptoms and signs involving the nervous system: Secondary | ICD-10-CM | POA: Diagnosis not present

## 2021-10-12 DIAGNOSIS — R202 Paresthesia of skin: Secondary | ICD-10-CM | POA: Diagnosis not present

## 2021-10-12 DIAGNOSIS — Z87891 Personal history of nicotine dependence: Secondary | ICD-10-CM | POA: Diagnosis not present

## 2021-10-12 DIAGNOSIS — N9489 Other specified conditions associated with female genital organs and menstrual cycle: Secondary | ICD-10-CM | POA: Diagnosis not present

## 2021-10-12 DIAGNOSIS — Z7982 Long term (current) use of aspirin: Secondary | ICD-10-CM | POA: Diagnosis not present

## 2021-10-12 DIAGNOSIS — R200 Anesthesia of skin: Secondary | ICD-10-CM | POA: Diagnosis not present

## 2021-10-12 DIAGNOSIS — Z20822 Contact with and (suspected) exposure to covid-19: Secondary | ICD-10-CM | POA: Diagnosis not present

## 2021-10-12 DIAGNOSIS — R9431 Abnormal electrocardiogram [ECG] [EKG]: Secondary | ICD-10-CM | POA: Diagnosis not present

## 2021-10-12 DIAGNOSIS — Z8673 Personal history of transient ischemic attack (TIA), and cerebral infarction without residual deficits: Secondary | ICD-10-CM | POA: Diagnosis not present

## 2021-10-12 DIAGNOSIS — J341 Cyst and mucocele of nose and nasal sinus: Secondary | ICD-10-CM | POA: Diagnosis not present

## 2021-10-12 DIAGNOSIS — I639 Cerebral infarction, unspecified: Secondary | ICD-10-CM | POA: Diagnosis not present

## 2021-10-12 LAB — RAPID URINE DRUG SCREEN, HOSP PERFORMED
Amphetamines: NOT DETECTED
Barbiturates: NOT DETECTED
Benzodiazepines: NOT DETECTED
Cocaine: NOT DETECTED
Opiates: NOT DETECTED
Tetrahydrocannabinol: NOT DETECTED

## 2021-10-12 LAB — CBC
HCT: 38 % (ref 36.0–46.0)
Hemoglobin: 12.7 g/dL (ref 12.0–15.0)
MCH: 29.7 pg (ref 26.0–34.0)
MCHC: 33.4 g/dL (ref 30.0–36.0)
MCV: 89 fL (ref 80.0–100.0)
Platelets: 238 10*3/uL (ref 150–400)
RBC: 4.27 MIL/uL (ref 3.87–5.11)
RDW: 13.4 % (ref 11.5–15.5)
WBC: 4.9 10*3/uL (ref 4.0–10.5)
nRBC: 0 % (ref 0.0–0.2)

## 2021-10-12 LAB — URINALYSIS, ROUTINE W REFLEX MICROSCOPIC
Bilirubin Urine: NEGATIVE
Glucose, UA: NEGATIVE mg/dL
Hgb urine dipstick: NEGATIVE
Ketones, ur: NEGATIVE mg/dL
Leukocytes,Ua: NEGATIVE
Nitrite: NEGATIVE
Protein, ur: NEGATIVE mg/dL
Specific Gravity, Urine: 1.01 (ref 1.005–1.030)
pH: 6.5 (ref 5.0–8.0)

## 2021-10-12 LAB — DIFFERENTIAL
Abs Immature Granulocytes: 0.01 10*3/uL (ref 0.00–0.07)
Basophils Absolute: 0.1 10*3/uL (ref 0.0–0.1)
Basophils Relative: 1 %
Eosinophils Absolute: 0.2 10*3/uL (ref 0.0–0.5)
Eosinophils Relative: 4 %
Immature Granulocytes: 0 %
Lymphocytes Relative: 30 %
Lymphs Abs: 1.5 10*3/uL (ref 0.7–4.0)
Monocytes Absolute: 0.3 10*3/uL (ref 0.1–1.0)
Monocytes Relative: 7 %
Neutro Abs: 2.9 10*3/uL (ref 1.7–7.7)
Neutrophils Relative %: 58 %

## 2021-10-12 LAB — ETHANOL: Alcohol, Ethyl (B): <10 mg/dL (ref <10)

## 2021-10-12 LAB — COMPREHENSIVE METABOLIC PANEL
ALT: 22 U/L (ref 0–44)
AST: 20 U/L (ref 15–41)
Albumin: 4.2 g/dL (ref 3.5–5.0)
Alkaline Phosphatase: 36 U/L — ABNORMAL LOW (ref 38–126)
Anion gap: 7 (ref 5–15)
BUN: 14 mg/dL (ref 6–20)
CO2: 23 mmol/L (ref 22–32)
Calcium: 9.2 mg/dL (ref 8.9–10.3)
Chloride: 107 mmol/L (ref 98–111)
Creatinine, Ser: 0.95 mg/dL (ref 0.44–1.00)
GFR, Estimated: >60 mL/min (ref >60)
Glucose, Bld: 103 mg/dL — ABNORMAL HIGH (ref 70–99)
Potassium: 3.9 mmol/L (ref 3.5–5.1)
Sodium: 137 mmol/L (ref 135–145)
Total Bilirubin: 0.7 mg/dL (ref 0.3–1.2)
Total Protein: 7 g/dL (ref 6.5–8.1)

## 2021-10-12 LAB — PROTIME-INR
INR: 1 (ref 0.8–1.2)
Prothrombin Time: 13.4 seconds (ref 11.4–15.2)

## 2021-10-12 LAB — RESP PANEL BY RT-PCR (FLU A&B, COVID) ARPGX2
Influenza A by PCR: NEGATIVE
Influenza B by PCR: NEGATIVE
SARS Coronavirus 2 by RT PCR: NEGATIVE

## 2021-10-12 LAB — APTT: aPTT: 31 seconds (ref 24–36)

## 2021-10-12 LAB — I-STAT BETA HCG BLOOD, ED (MC, WL, AP ONLY): I-stat hCG, quantitative: <5 m[IU]/mL (ref <5)

## 2021-10-12 IMAGING — CT CT HEAD W/O CM
4 series · 16 of 47 positions shown, 18 images · non-contrast
Comparison: [DATE] [DATE], [DATE] , [DATE] [DATE], [DATE]

CLINICAL DATA: Neuro deficit, acute, stroke suspected. History of
RIGHT MCA infarctions.

EXAM:
CT HEAD WITHOUT CONTRAST
TECHNIQUE: Contiguous axial images were obtained from the base of the skull
through the vertex without intravenous contrast.

[Series 3: head without · axial · non-contrast · 0.42mm/px · z∈[-176,-61]mm · 7 of 31 slices shown, 9 images]
[im 4/31  brain]
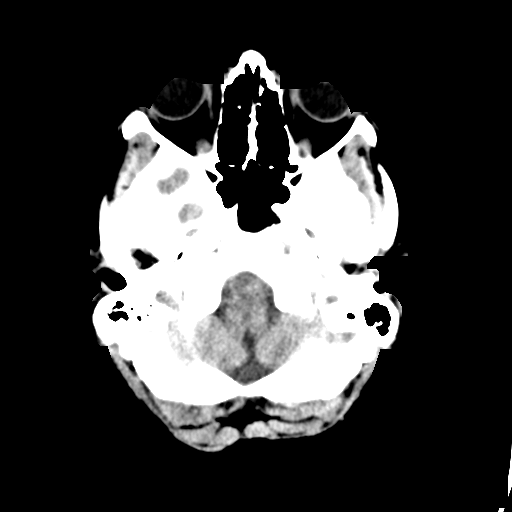
[im 4/31  bone]
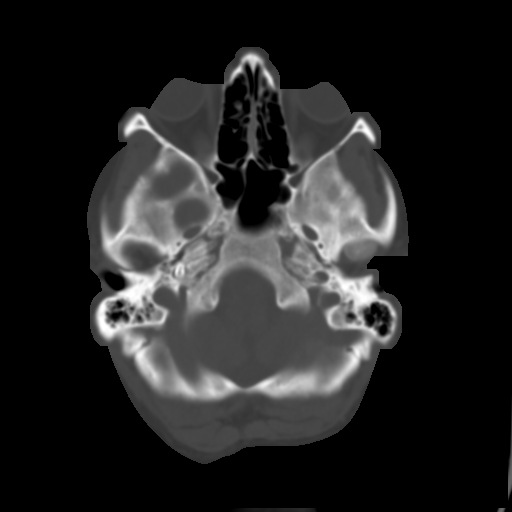
[im 8/31  brain]
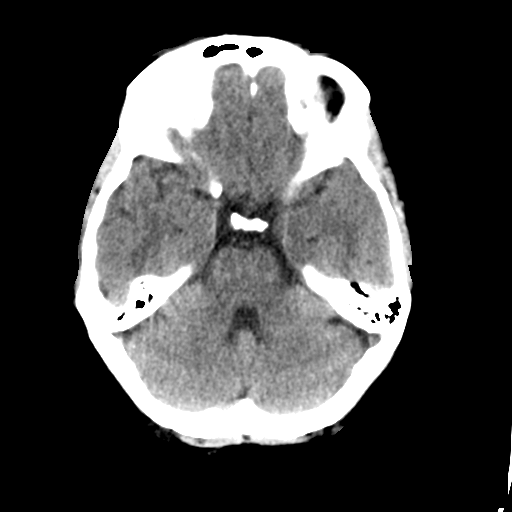
[im 12/31  brain]
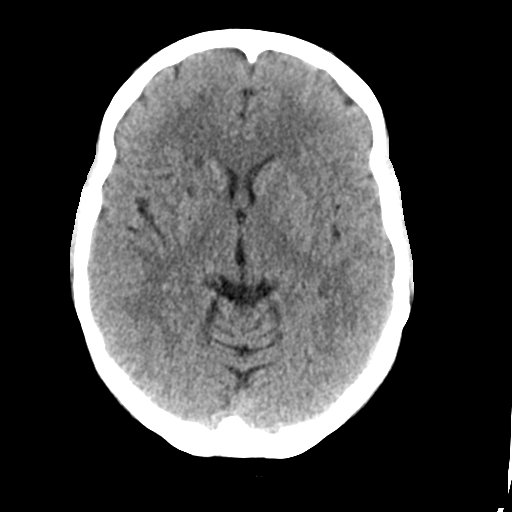
[im 16/31  brain]
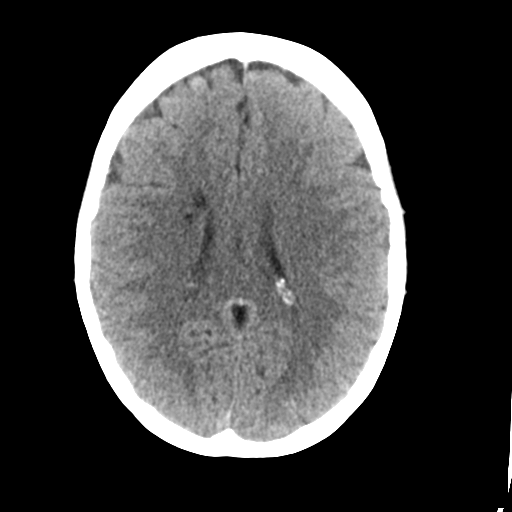
[im 19/31  brain]
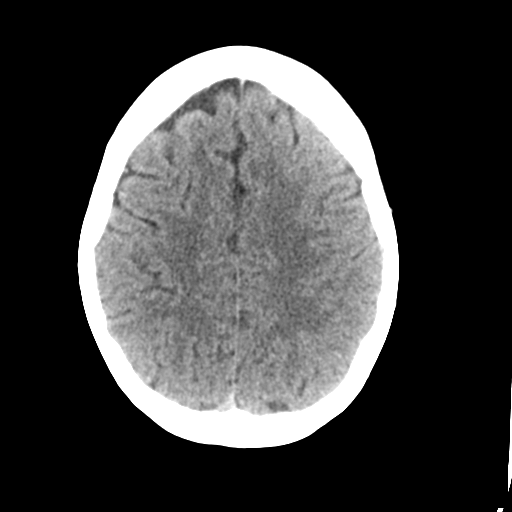
[im 19/31  bone]
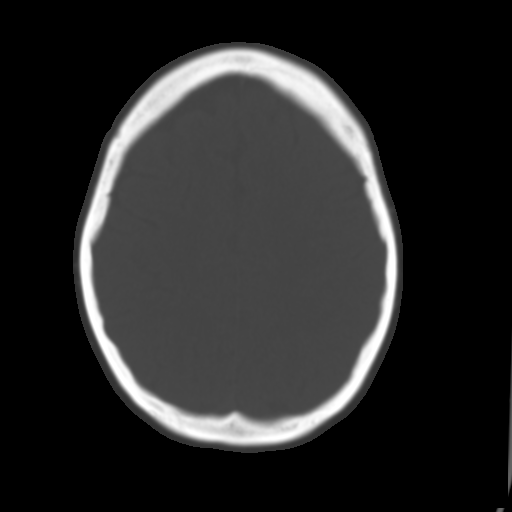
[im 23/31  brain]
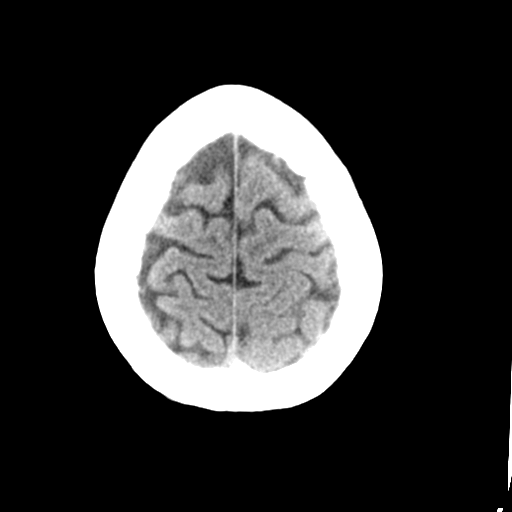
[im 27/31  brain]
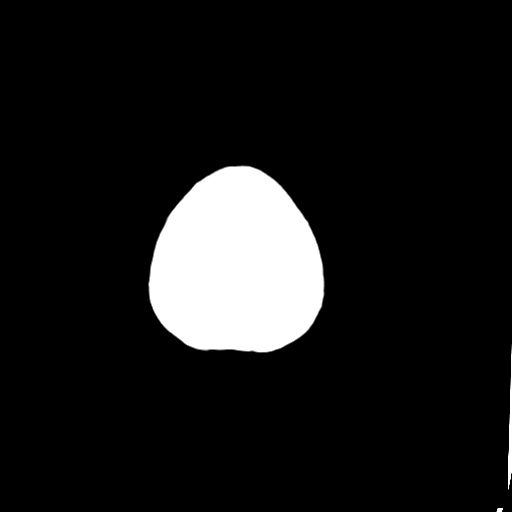

[Series 4: head bone · axial · 0.42mm/px · z∈[-177,-145]mm · 3 of 78 slices shown]
[im 8/78  bone]
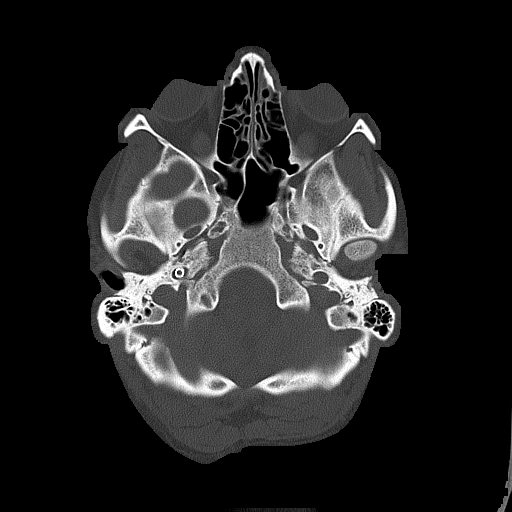
[im 16/78  bone]
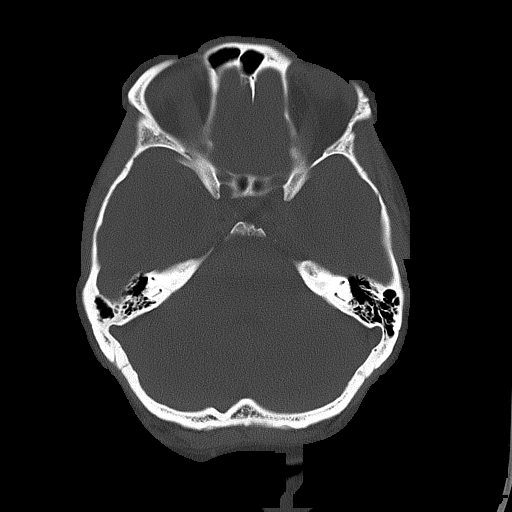
[im 24/78  bone]
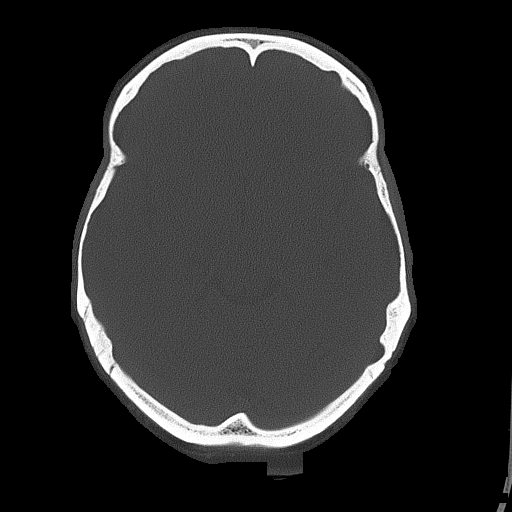

[Series 5: head without cor · coronal · non-contrast · 0.31mm/px · 3 of 67 slices shown]
[im 23/67  brain]
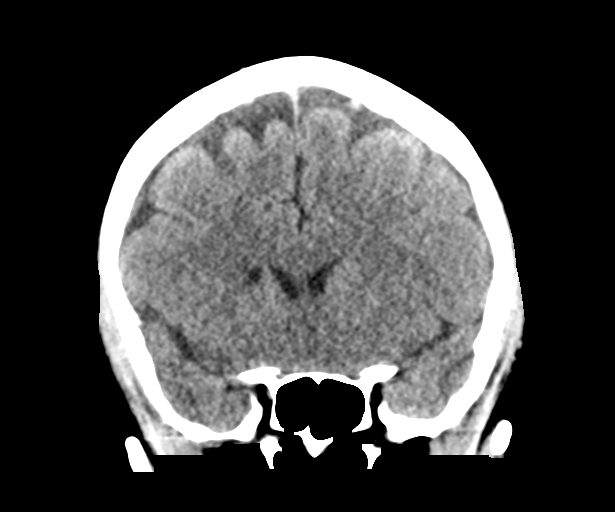
[im 30/67  brain]
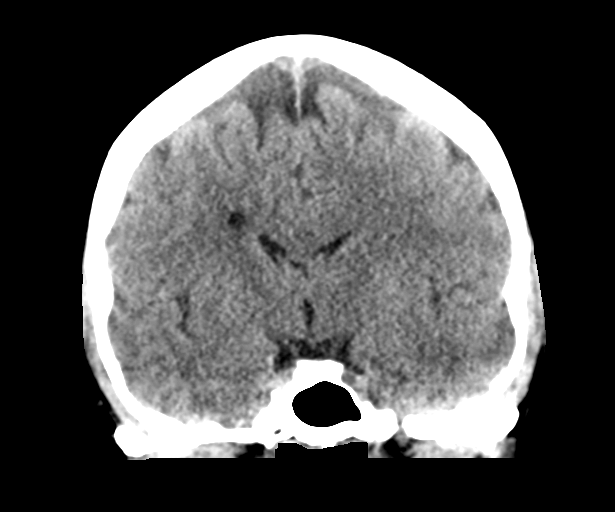
[im 37/67  brain]
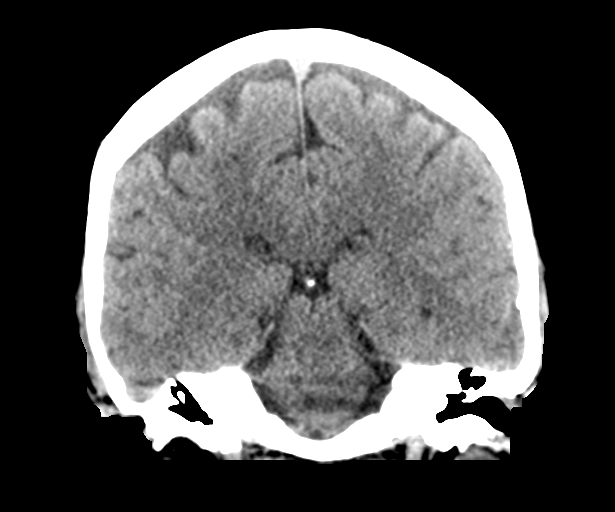

[Series 6: head without sag · sagittal · non-contrast · 0.32mm/px · 3 of 67 slices shown]
[im 23/67  brain]
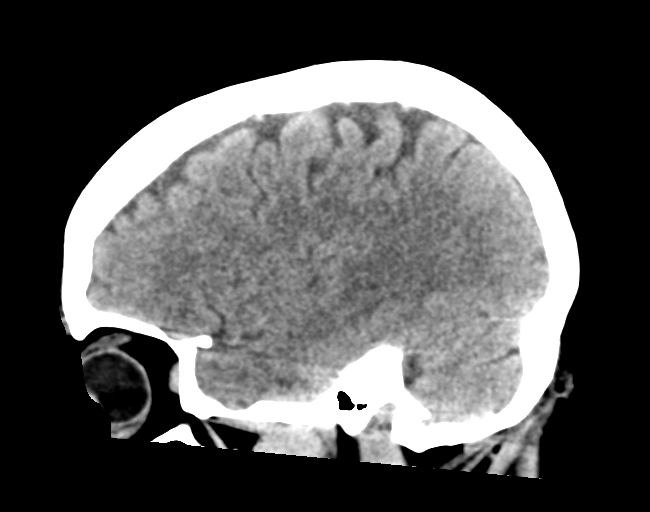
[im 34/67  brain]
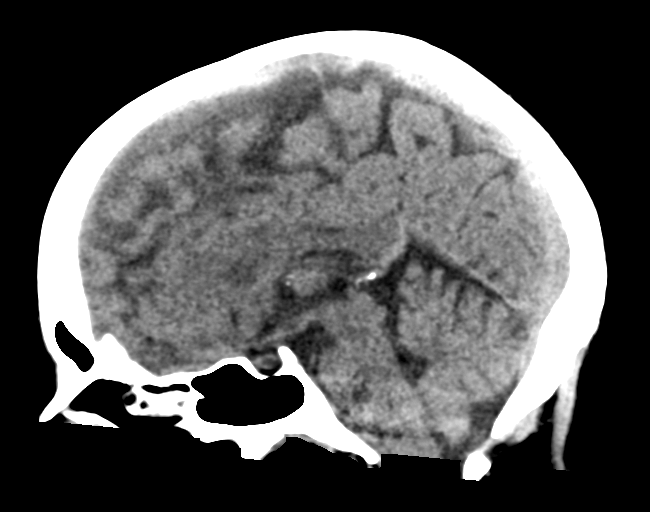
[im 45/67  brain]
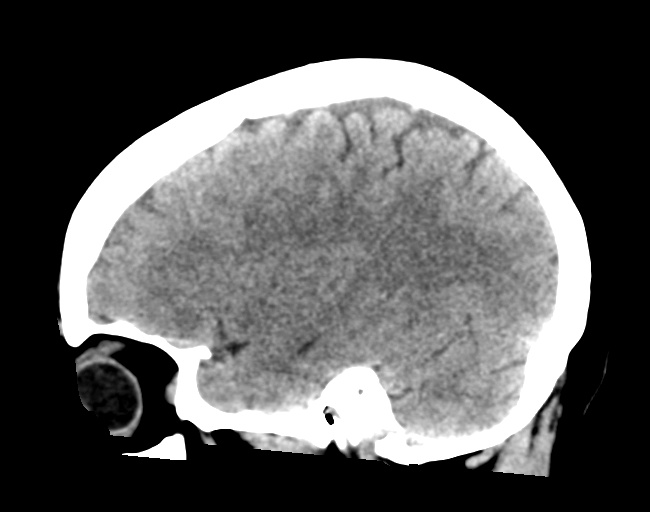

[16 of 47 positions shown; findings below may reference images not displayed]

FINDINGS: Brain: No evidence of acute infarction, hemorrhage, hydrocephalus,
extra-axial collection or mass lesion/mass effect. There is been
interval evolution of previously described patchy multifocal
infarctions involving the RIGHT MCA predominately watershed
distribution. These involve the RIGHT basal ganglia and
frontoparietal lobe subcortical white matter.

Vascular: No hyperdense vessel or unexpected calcification.

Skull: Normal. Negative for fracture or focal lesion.

Sinuses/Orbits: No acute finding.

Other: None.
IMPRESSION: No acute intracranial abnormality. Expected evolution of prior RIGHT
MCA distribution infarction.

## 2021-10-12 IMAGING — MR MR HEAD W/O CM
6 of 11 series · 25 of 48 positions shown · non-contrast
Comparison: Head CT same day.  MRI [DATE]

CLINICAL DATA: Neuro deficit, acute, stroke suspected.

EXAM:
MRI HEAD WITHOUT CONTRAST
TECHNIQUE: Multiplanar, multiecho pulse sequences of the brain and surrounding
structures were obtained without intravenous contrast.

[Series 2: DWI · axial · 3.0mm · 0.94mm/px · z∈[-122,+26]mm · 8 of 108 slices shown (1 of 2)]
[im 1/108]
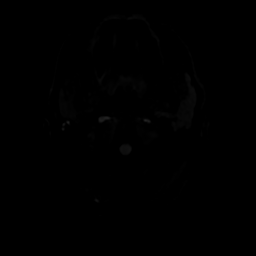
[im 16/108]
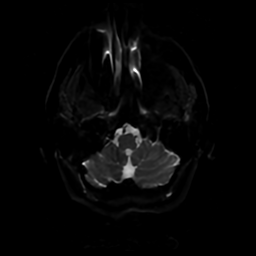
[im 31/108]
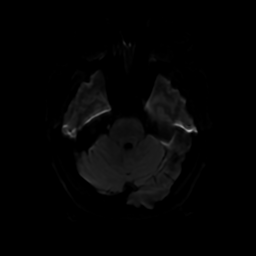
[im 46/108]
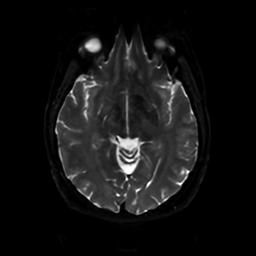
[im 62/108]
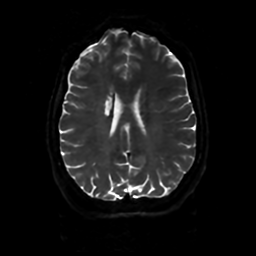
[im 77/108]
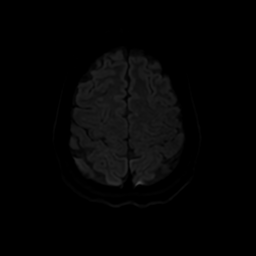
[im 92/108]
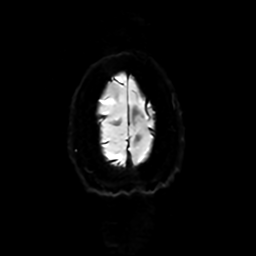
[im 108/108]
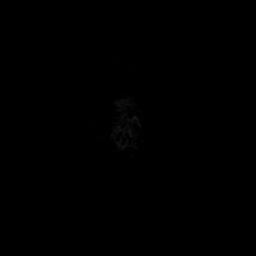

[Series 3: DWI · coronal · 4.0mm · 0.94mm/px · 5 of 75 slices shown (2 of 2)]
[im 1/75]
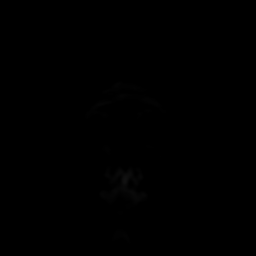
[im 19/75]
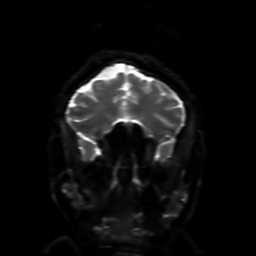
[im 38/75]
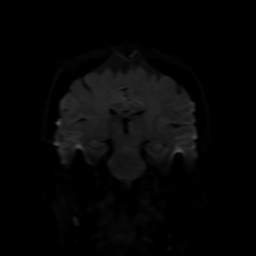
[im 56/75]
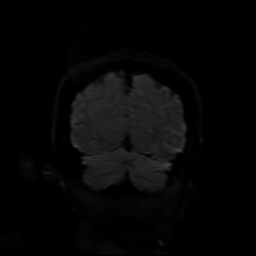
[im 75/75]
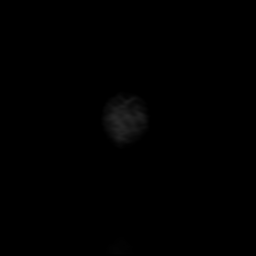

[Series 4: FLAIR · sagittal · 5.0mm · 0.23mm/px · 2 of 27 slices shown (1 of 2)]
[im 1/27]
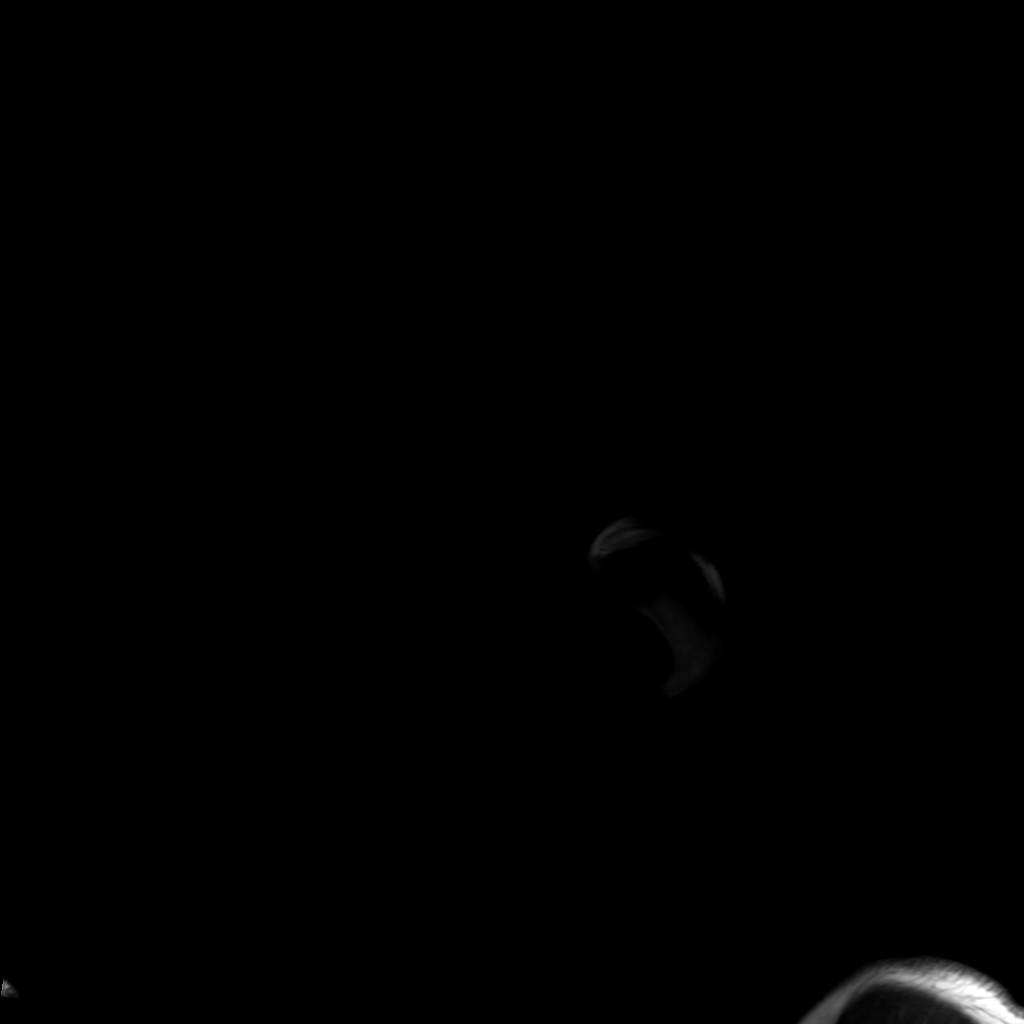
[im 27/27]
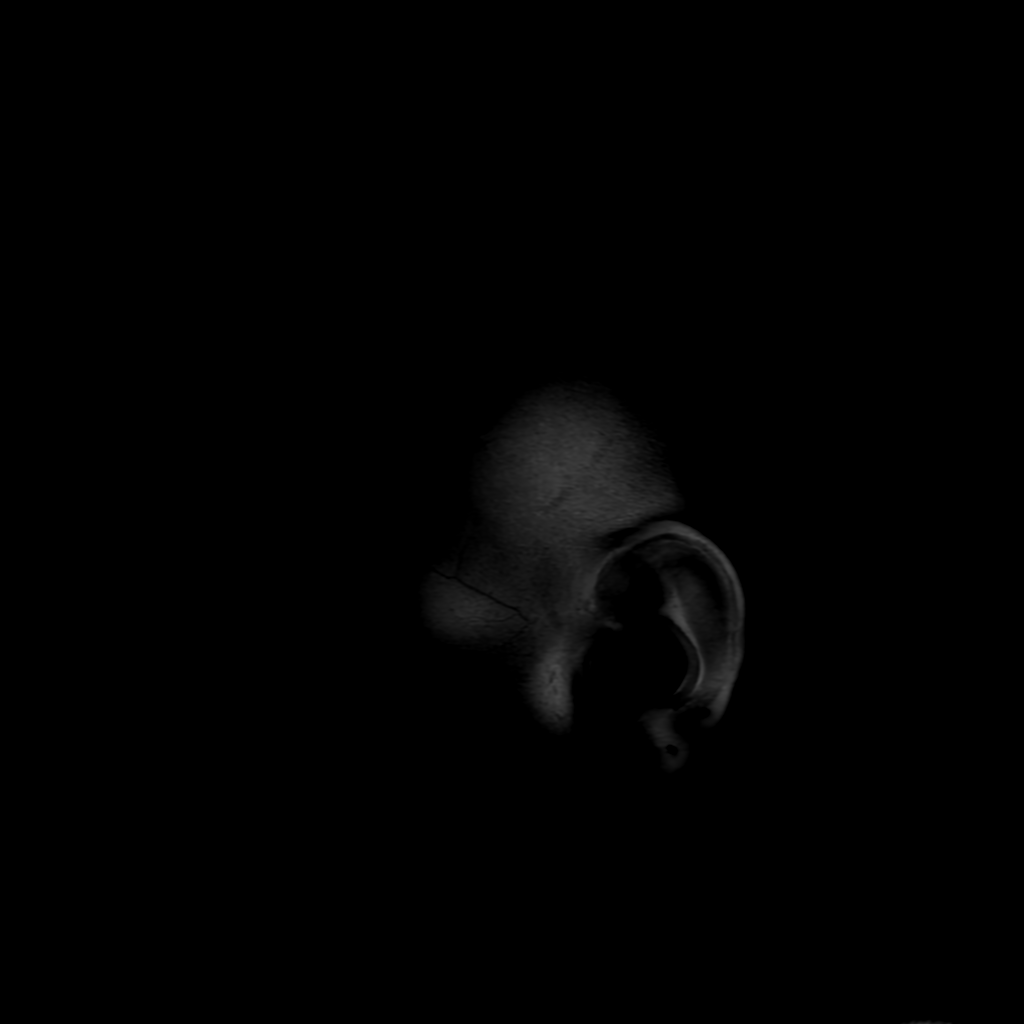

[Series 6: FLAIR · axial · 4.0mm · 0.45mm/px · z∈[-123,+25]mm · 3 of 37 slices shown (2 of 2)]
[im 1/37]
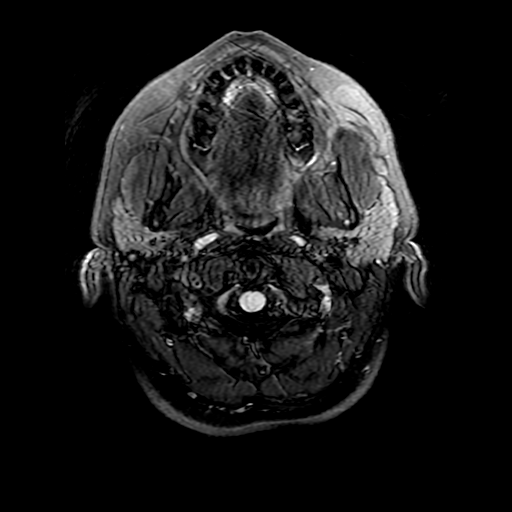
[im 19/37]
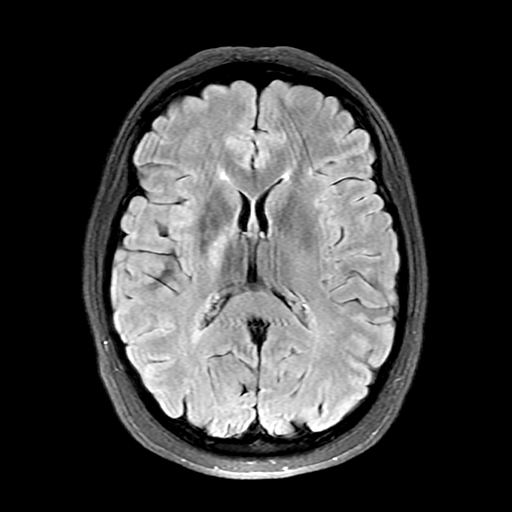
[im 37/37]
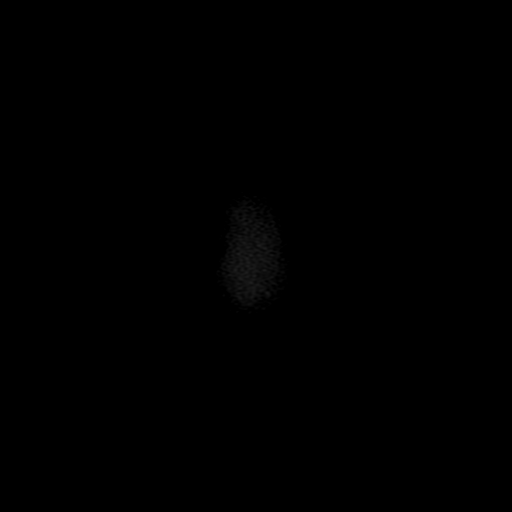

[Series 250: ADC · axial · 3.0mm · 0.94mm/px · z∈[-122,+26]mm · 4 of 54 slices shown (1 of 2)]
[im 1/54]
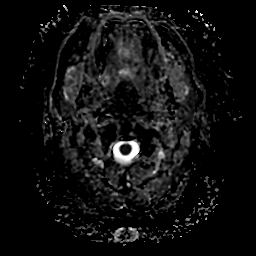
[im 18/54]
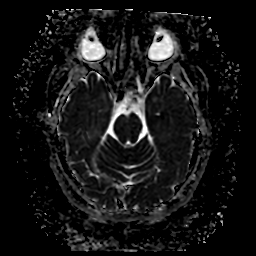
[im 36/54]
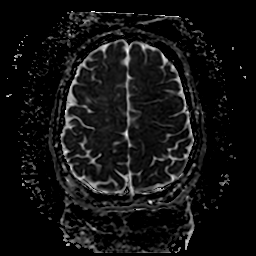
[im 54/54]
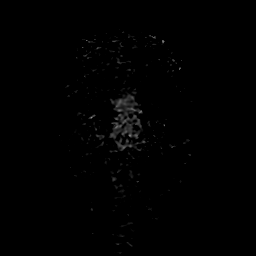

[Series 350: ADC · coronal · 4.0mm · 0.94mm/px · 3 of 37 slices shown (2 of 2)]
[im 1/37]
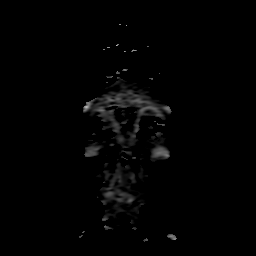
[im 19/37]
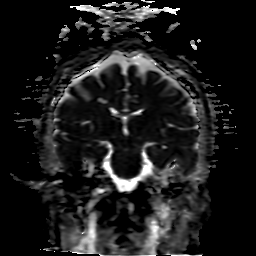
[im 37/37]
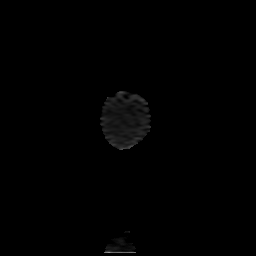

[25 of 48 positions shown; findings below may reference images not displayed]

FINDINGS: Brain: Diffusion imaging does not show any acute or subacute
infarction. Brainstem and cerebellum are normal. Left cerebral
hemisphere is normal. Chronic sequela of previous left middle
cerebral artery territory infarction with areas of focal
encephalomalacia affecting the basal ganglia and to a lesser extent
foci within the white matter. No interval insult is evident. No
mass, acute hemorrhage, hydrocephalus or extra-axial collection.
Some chronic hemosiderin deposition in the region of the old basal
ganglia insult.

Vascular: Major vessels at the base of the brain show flow.

Skull and upper cervical spine: Negative

Sinuses/Orbits: Clear except for a retention cyst at the right
maxillary sinus floor. Orbits negative.

Other: None
IMPRESSION: No acute MR finding. Evolutionary sequela of the previous right
hemisphere infarction which was acute in [REDACTED] of this year.

## 2021-10-12 MED ORDER — SODIUM CHLORIDE 0.9 % IV BOLUS
500.0000 mL | Freq: Once | INTRAVENOUS | Status: AC
  Administered 2021-10-12: 13:00:00 500 mL via INTRAVENOUS

## 2021-10-12 MED ORDER — SODIUM CHLORIDE 0.9 % IV SOLN: 100.0000 mL/h | INTRAVENOUS | Status: DC

## 2021-10-12 NOTE — ED Notes (Signed)
Patient transported to CT 

## 2021-10-12 NOTE — ED Notes (Signed)
Pt verbalized understanding of d/c instructions, meds and followup care. Denies questions. VSS, no distress noted. Steady gait to exit with all belongings.  ?

## 2021-10-12 NOTE — ED Triage Notes (Signed)
Hx of stroke and reports around 10am started having tingling in left hand/fingers.  Denies any other symptoms.

## 2021-10-12 NOTE — ED Notes (Signed)
Pt to MRI

## 2021-10-12 NOTE — ED Provider Notes (Signed)
[facility] EMERGENCY DEPARTMENT Provider Note   CSN: [id number] Arrival date & time: 10/12/21  1228     History Chief Complaint  Patient presents with   Tingling    Dana Henry is a 40 y.o. female.  Pt presents to the ED today with left hand numbness starting at 1000 today.  In January, pt had an ischemic right MCA stroke.  She went to IR and had a mechanical thrombectomy and her right internal carotid artery had angioplasty and was stented.  Pt has been on Brilinta and has been compliant.  All of pt's neurological sx resolved until today when she developed numbness to her left hand.  She denies any other neuro sx.      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   Chronic tension-type headache, not intractable 01/09/2021   Class 1 obesity due to excess calories with serious comorbidity and body mass index (BMI) of 30.0 to 30.9 in adult    Slow transit constipation    Hypoalbuminemia due to protein-calorie malnutrition (HCC)    Marijuana abuse    Left hemiparesis ([place])    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   Dysphagia, post-stroke    Acute blood loss anemia    Stroke (cerebrum) ([place]) 11/13/2020   Internal carotid artery stenosis, right 11/13/2020   Migraines 10/06/2012    Past Surgical History:  Procedure Laterality Date   BUBBLE STUDY  11/18/2020   Procedure: BUBBLE STUDY;  Surgeon: Elouise Munroe, MD;  Location: [place];  Service: Cardiology;;   CESAREAN SECTION N/A 07/21/2014   Procedure: CESAREAN SECTION;  Surgeon: Elveria Royals, MD;  Location: [facility];  Service: Obstetrics;  Laterality: N/A;   CESAREAN SECTION N/A 08/10/2017   Procedure: Repeat CESAREAN SECTION;  Surgeon: Azucena Fallen, MD;  Location: [place];  Service: Obstetrics;  Laterality: N/A;  EDD: 08/17/17 Allergy: Sulfa   IR ANGIO INTRA EXTRACRAN SEL COM CAROTID INNOMINATE BILAT MOD SED  01/16/2021   IR CT HEAD LTD  11/13/2020   IR INTRA CRAN STENT  11/13/2020   IR INTRA CRAN STENT  02/05/2021   IR PERCUTANEOUS ART THROMBECTOMY/INFUSION INTRACRANIAL INC DIAG ANGIO  11/13/2020   IR RADIOLOGIST EVAL & MGMT  01/21/2021   IR RADIOLOGIST EVAL & MGMT  02/22/2021   IR TRANSCATH/EMBOLIZ  02/05/2021   IR US GUIDE VASC ACCESS RIGHT  02/05/2021   KIDNEY STONE SURGERY     RADIOLOGY WITH ANESTHESIA N/A 11/13/2020   Procedure: IR WITH ANESTHESIA;  Surgeon: Radiologist, Medication, MD;  Location: [place];  Service: Radiology;  Laterality: N/A;   RADIOLOGY WITH ANESTHESIA N/A 02/05/2021   Procedure: MRI WITH ANESTHESIA EMBOLIZATION;  Surgeon: Luanne Bras, MD;  Location: [place];  Service: Radiology;  Laterality: N/A;   TEE WITHOUT CARDIOVERSION N/A 11/18/2020   Procedure: TRANSESOPHAGEAL ECHOCARDIOGRAM (TEE);  Surgeon: Elouise Munroe, MD;  Location: [place];  Service: Cardiology;  Laterality: N/A;   TONSILLECTOMY AND ADENOIDECTOMY  1991     OB History  Gravida  2   Para  2   Term  2   Preterm      AB      Living  2      SAB      IAB      Ectopic      Multiple  0   Live Births  2           Family History  Problem Relation Age of Onset   Hypertension Mother    Alcohol abuse Father     Social History   Tobacco Use   Smoking status: Former    Packs/day: 0.50    Types: Cigarettes    Quit date: 10/19/2013    Years since quitting: 7.9   Smokeless tobacco: Never  Vaping Use   Vaping Use: Never used  Substance Use Topics   Alcohol use: Yes    Comment: occassional   Drug use: No    Home Medications Prior to  Admission medications   Medication Sig Start Date End Date Taking? Authorizing Provider  acetaminophen (TYLENOL) 500 MG tablet Take 1,000 mg by mouth every 6 (six) hours as needed for moderate pain.   Yes [provider]  aspirin 81 MG chewable tablet Chew 1 tablet (81 mg total) by mouth daily. 11/21/20  Yes Angiulli, Lavon Paganini, PA-C  Multiple Vitamins-Minerals (MULTIVITAMIN WITH MINERALS) tablet Take 1 tablet by mouth daily.   Yes [provider]  ticagrelor (BRILINTA) 90 MG TABS tablet Take 1 tablet (90 mg total) by mouth 2 (two) times daily. Patient taking differently: Take 45 mg by mouth 2 (two) times daily. 02/04/21  Yes Garvin Fila, MD  atorvastatin (LIPITOR) 40 MG tablet Take 1 tablet (40 mg total) by mouth daily. Patient not taking: Reported on 10/12/2021 12/23/20   Copland, Gay Filler, MD  fluticasone (FLONASE) 50 MCG/ACT nasal spray Place 2 sprays into both nostrils daily. Patient not taking: Reported on 10/12/2021 09/10/21   Margarette Canada, NP    Allergies    Sulfa antibiotics  Review of Systems   Review of Systems  Neurological:        Left hand numbness  All other systems reviewed and are negative.  Physical Exam Updated Vital Signs BP 114/65    Pulse 78    Temp 97.7 F (36.5 C) (Oral)    Resp 15    Ht 5\' 4"  (1.626 m)    Wt 84.8 kg    SpO2 100%    BMI 32.10 kg/m   Physical Exam Vitals and nursing note reviewed.  Constitutional:      Appearance: Normal appearance.  HENT:     Head: Normocephalic and atraumatic.     Right Ear: External ear normal.     Left Ear: External ear normal.     Nose: Nose normal.     Mouth/Throat:     Mouth: Mucous membranes are moist.     Pharynx: Oropharynx is clear.  Eyes:     Extraocular Movements: Extraocular movements intact.     Conjunctiva/sclera: Conjunctivae normal.     Pupils: Pupils are equal, round, and reactive to light.  Cardiovascular:     Rate and Rhythm: Normal rate and regular rhythm.     Pulses: Normal  pulses.     Heart sounds: Normal heart sounds.  Pulmonary:     Effort: Pulmonary effort is normal.     Breath sounds: Normal breath sounds.  Abdominal:     General: Abdomen is flat. Bowel  sounds are normal.     Palpations: Abdomen is soft.  Musculoskeletal:        General: Normal range of motion.     Cervical back: Normal range of motion and neck supple.  Skin:    General: Skin is warm.     Capillary Refill: Capillary refill takes less than 2 seconds.  Neurological:     Mental Status: She is alert and oriented to person, place, and time.     Comments: Numbness to left hand, but good motor strength  Psychiatric:        Mood and Affect: Mood normal.        Behavior: Behavior normal.    ED Results / Procedures / Treatments   Labs (all labs ordered are listed, but only abnormal results are displayed) Labs Reviewed  COMPREHENSIVE METABOLIC PANEL - Abnormal; Notable for the following components:      Result Value   Glucose, Bld 103 (*)    Alkaline Phosphatase 36 (*)    All other components within normal limits  RESP PANEL BY RT-PCR (FLU A&B, COVID) ARPGX2  ETHANOL  PROTIME-INR  APTT  CBC  DIFFERENTIAL  RAPID URINE DRUG SCREEN, HOSP PERFORMED  URINALYSIS, ROUTINE W REFLEX MICROSCOPIC  I-STAT BETA HCG BLOOD, ED ([facility], [facility], [facility] ONLY)    EKG EKG Interpretation  Date/Time:  Sunday October 12 2021 12:45:44 EST Ventricular Rate:  74 PR Interval:  175 QRS Duration: 99 QT Interval:  394 QTC Calculation: 438 R Axis:   66 Text Interpretation: Sinus rhythm No significant change since last tracing Confirmed by Isla Pence [phone]) on 10/12/2021 1:08:30 PM  Radiology CT HEAD WO CONTRAST  Result Date: 10/12/2021 CLINICAL DATA:  Neuro deficit, acute, stroke suspected. History of RIGHT MCA infarctions. EXAM: CT HEAD WITHOUT CONTRAST TECHNIQUE: Contiguous axial images were obtained from the base of the skull through the vertex without intravenous contrast. COMPARISON:  November 13, 2020 , November 14, 2020 FINDINGS: Brain: No evidence of acute infarction, hemorrhage, hydrocephalus, extra-axial collection or mass lesion/mass effect. There is been interval evolution of previously described patchy multifocal infarctions involving the RIGHT MCA predominately watershed distribution. These involve the RIGHT basal ganglia and frontoparietal lobe subcortical white matter. Vascular: No hyperdense vessel or unexpected calcification. Skull: Normal. Negative for fracture or focal lesion. Sinuses/Orbits: No acute finding. Other: None. IMPRESSION: No acute intracranial abnormality. Expected evolution of prior RIGHT MCA distribution infarction. Electronically Signed   By: Valentino Saxon M.D.   On: 10/12/2021 13:13    Procedures Procedures   Medications Ordered in ED Medications  sodium chloride 0.9 % bolus 500 mL (0 mLs Intravenous Stopped 10/12/21 1344)    ED Course  I have reviewed the triage vital signs and the nursing notes.  Pertinent labs & imaging results that were available during my care of the patient were reviewed by me and considered in my medical decision making (see chart for details).    MDM Rules/Calculators/A&P                         Pt's CT is nl.  Pt d/w Dr. Theda Sers (neurology) who will see pt in consult.  He recommends a MRI.  MRI is still pending.  Pt signed out to Dr. Matilde Sprang at shift change.   Final Clinical Impression(s) / ED Diagnoses Final diagnoses:  Paresthesia    Rx / DC Orders ED Discharge Orders     None  Isla Pence, MD 10/12/21 1524

## 2021-11-15 ENCOUNTER — Other Ambulatory Visit: Payer: Self-pay | Admitting: Neurology

## 2022-01-21 ENCOUNTER — Other Ambulatory Visit: Payer: Self-pay | Admitting: Family Medicine

## 2022-01-23 ENCOUNTER — Other Ambulatory Visit: Payer: Self-pay | Admitting: Family Medicine

## 2022-01-27 ENCOUNTER — Other Ambulatory Visit: Payer: Self-pay | Admitting: Family Medicine

## 2022-02-02 ENCOUNTER — Encounter: Payer: Self-pay | Admitting: Family Medicine

## 2022-02-03 ENCOUNTER — Telehealth: Payer: Self-pay

## 2022-02-03 ENCOUNTER — Other Ambulatory Visit: Payer: Self-pay

## 2022-02-03 ENCOUNTER — Encounter: Payer: Self-pay | Admitting: Family Medicine

## 2022-02-03 MED ORDER — ATORVASTATIN CALCIUM 40 MG PO TABS: 40.0000 mg | ORAL_TABLET | Freq: Every day | ORAL | 1 refills | Status: DC

## 2022-02-03 NOTE — Telephone Encounter (Signed)
Per neurology notes 4/22: ?Continue aspirin 81 mg daily and Brilinta (ticagrelor) 90 mg bid  for secondary stroke prevention for 6 months and then aspirin alone  ? ?I think she was supposed to stop Brilinta.  I have not seen her in over a year.  Message to pt ?

## 2022-02-03 NOTE — Telephone Encounter (Signed)
Dr Lorelei Pont. I sent the Rx for the Atorvastatin, but not the Rx for the Brilinta: the instructions on the sig are unclear and the end date is set for 9/22. Please advise.  ?

## 2022-02-09 ENCOUNTER — Emergency Department (HOSPITAL_COMMUNITY)
Admission: EM | Admit: 2022-02-09 | Discharge: 2022-02-09 | Disposition: A | Discharge status: Home / Self Care | Payer: BC Managed Care – PPO | Attending: Emergency Medicine | Admitting: Emergency Medicine

## 2022-02-09 ENCOUNTER — Other Ambulatory Visit: Payer: Self-pay

## 2022-02-09 ENCOUNTER — Encounter (HOSPITAL_COMMUNITY): Payer: Self-pay | Admitting: Emergency Medicine

## 2022-02-09 ENCOUNTER — Emergency Department (HOSPITAL_COMMUNITY): Payer: BC Managed Care – PPO

## 2022-02-09 DIAGNOSIS — Q283 Other malformations of cerebral vessels: Secondary | ICD-10-CM | POA: Diagnosis not present

## 2022-02-09 DIAGNOSIS — H539 Unspecified visual disturbance: Secondary | ICD-10-CM | POA: Diagnosis not present

## 2022-02-09 DIAGNOSIS — G9389 Other specified disorders of brain: Secondary | ICD-10-CM | POA: Diagnosis not present

## 2022-02-09 DIAGNOSIS — G43109 Migraine with aura, not intractable, without status migrainosus: Secondary | ICD-10-CM

## 2022-02-09 DIAGNOSIS — Z7982 Long term (current) use of aspirin: Secondary | ICD-10-CM | POA: Insufficient documentation

## 2022-02-09 DIAGNOSIS — R519 Headache, unspecified: Secondary | ICD-10-CM | POA: Diagnosis not present

## 2022-02-09 DIAGNOSIS — G43909 Migraine, unspecified, not intractable, without status migrainosus: Secondary | ICD-10-CM | POA: Diagnosis not present

## 2022-02-09 DIAGNOSIS — H538 Other visual disturbances: Secondary | ICD-10-CM | POA: Diagnosis not present

## 2022-02-09 LAB — CBC WITH DIFFERENTIAL/PLATELET
Abs Immature Granulocytes: 0.03 10*3/uL (ref 0.00–0.07)
Basophils Absolute: 0 10*3/uL (ref 0.0–0.1)
Basophils Relative: 1 %
Eosinophils Absolute: 0.1 10*3/uL (ref 0.0–0.5)
Eosinophils Relative: 2 %
HCT: 38.3 % (ref 36.0–46.0)
Hemoglobin: 12.8 g/dL (ref 12.0–15.0)
Immature Granulocytes: 1 %
Lymphocytes Relative: 25 %
Lymphs Abs: 1.5 10*3/uL (ref 0.7–4.0)
MCH: 30.3 pg (ref 26.0–34.0)
MCHC: 33.4 g/dL (ref 30.0–36.0)
MCV: 90.8 fL (ref 80.0–100.0)
Monocytes Absolute: 0.3 10*3/uL (ref 0.1–1.0)
Monocytes Relative: 5 %
Neutro Abs: 4.1 10*3/uL (ref 1.7–7.7)
Neutrophils Relative %: 66 %
Platelets: 259 10*3/uL (ref 150–400)
RBC: 4.22 MIL/uL (ref 3.87–5.11)
RDW: 12.8 % (ref 11.5–15.5)
WBC: 6.2 10*3/uL (ref 4.0–10.5)
nRBC: 0 % (ref 0.0–0.2)

## 2022-02-09 LAB — COMPREHENSIVE METABOLIC PANEL
ALT: 24 U/L (ref 0–44)
AST: 26 U/L (ref 15–41)
Albumin: 4.2 g/dL (ref 3.5–5.0)
Alkaline Phosphatase: 38 U/L (ref 38–126)
Anion gap: 5 (ref 5–15)
BUN: 12 mg/dL (ref 6–20)
CO2: 24 mmol/L (ref 22–32)
Calcium: 9.2 mg/dL (ref 8.9–10.3)
Chloride: 111 mmol/L (ref 98–111)
Creatinine, Ser: 1.05 mg/dL — ABNORMAL HIGH (ref 0.44–1.00)
GFR, Estimated: >60 mL/min (ref >60)
Glucose, Bld: 104 mg/dL — ABNORMAL HIGH (ref 70–99)
Potassium: 3.8 mmol/L (ref 3.5–5.1)
Sodium: 140 mmol/L (ref 135–145)
Total Bilirubin: 0.7 mg/dL (ref 0.3–1.2)
Total Protein: 7.1 g/dL (ref 6.5–8.1)

## 2022-02-09 LAB — I-STAT CHEM 8, ED
BUN: 16 mg/dL (ref 6–20)
Calcium, Ion: 1.22 mmol/L (ref 1.15–1.40)
Chloride: 107 mmol/L (ref 98–111)
Creatinine, Ser: 0.8 mg/dL (ref 0.44–1.00)
Glucose, Bld: 105 mg/dL — ABNORMAL HIGH (ref 70–99)
HCT: 39 % (ref 36.0–46.0)
Hemoglobin: 13.3 g/dL (ref 12.0–15.0)
Potassium: 3.8 mmol/L (ref 3.5–5.1)
Sodium: 141 mmol/L (ref 135–145)
TCO2: 22 mmol/L (ref 22–32)

## 2022-02-09 LAB — I-STAT BETA HCG BLOOD, ED (MC, WL, AP ONLY): I-stat hCG, quantitative: <5 m[IU]/mL (ref <5)

## 2022-02-09 IMAGING — CT CT ANGIO HEAD-NECK (W OR W/O PERF)
2 of 11 series · 6 of 34 positions shown · IV contrast (OMNI 350)
Comparison: [DATE] CT head, [DATE] CTA head neck

CLINICAL DATA: Headache and visual disturbance

EXAM:
CT ANGIOGRAPHY HEAD AND NECK
TECHNIQUE: Multidetector CT imaging of the head and neck was performed using
the standard protocol during bolus administration of intravenous
contrast. Multiplanar CT image reconstructions and MIPs were
obtained to evaluate the vascular anatomy. Carotid stenosis
measurements (when applicable) are obtained utilizing NASCET
criteria, using the distal internal carotid diameter as the
denominator.

[Series 8: sagittal · sagittal · 0.31mm/px · 1 of 62 slices shown]
[im 29/62  soft-tissue]
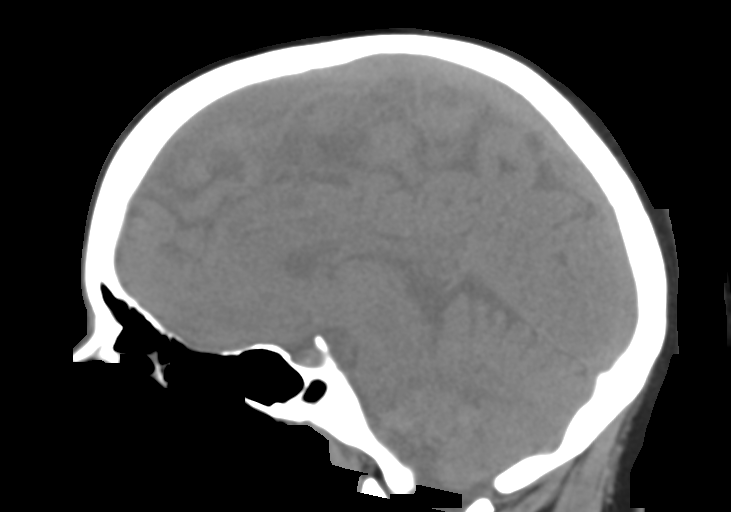

[Series 11: cta neck axial · axial · 0.39mm/px · z∈[-336,-118]mm · 5 of 328 slices shown]
[im 55/328  soft-tissue]
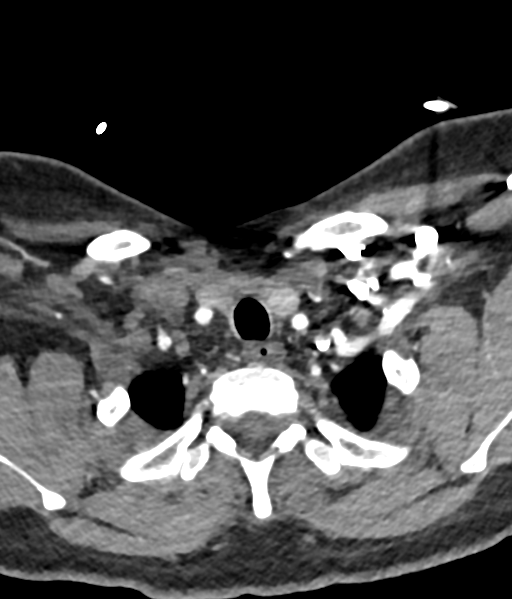
[im 110/328  bone]
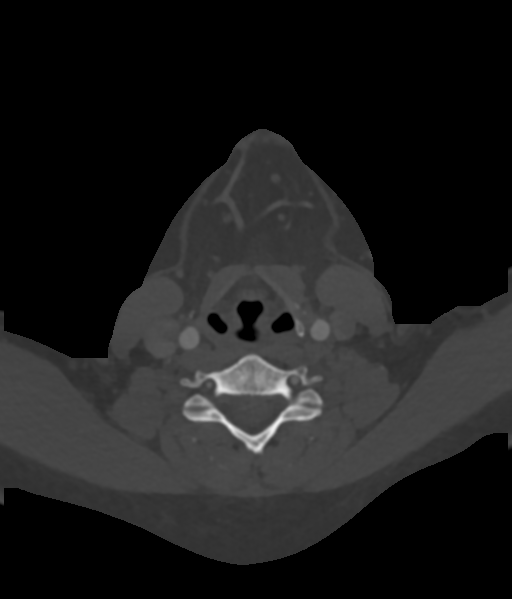
[im 164/328  soft-tissue]
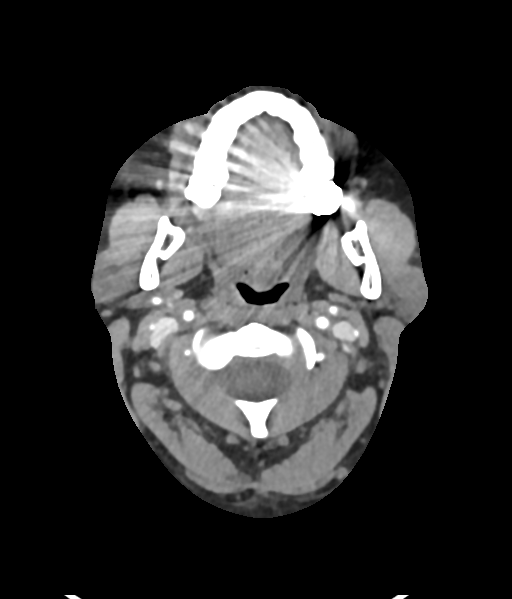
[im 219/328  bone]
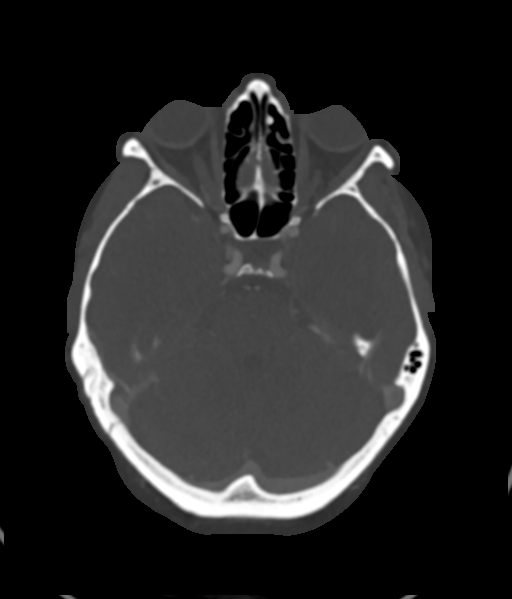
[im 273/328  soft-tissue]
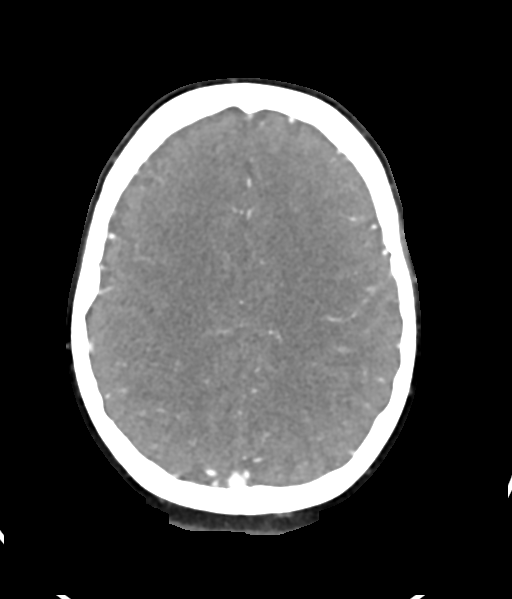

[6 of 34 positions shown; findings below may reference images not displayed]

RADIATION DOSE REDUCTION: This exam was performed according to the
departmental dose-optimization program which includes automated
exposure control, adjustment of the mA and/or kV according to
patient size and/or use of iterative reconstruction technique.

CONTRAST:  75mL OMNIPAQUE IOHEXOL 350 MG/ML SOLN
FINDINGS: CT HEAD FINDINGS

Brain: No acute infarct, hemorrhage, mass, mass effect, or midline
shift. Redemonstrated encephalomalacia in the right basal ganglia
and corona radiata, which appears similar to the prior CT. No
hydrocephalus or extra-axial collection.

Vascular: Please see CTA findings below.

Skull: Normal. Negative for fracture or focal lesion.

Sinuses: Imaged portions are clear.

Orbits: No acute finding.

Review of the MIP images confirms the above findings

CTA NECK FINDINGS

Aortic arch: 4 vessel arch with aberrant origin of the right
subclavian and right common carotid arising from the aortic arch.
Imaged portion shows no evidence of aneurysm or dissection. No
significant stenosis of the major arch vessel origins.

Right carotid system: No evidence of dissection, stenosis (50% or
greater) or occlusion.

Left carotid system: No evidence of dissection, stenosis (50% or
greater) or occlusion.

Vertebral arteries: No evidence of dissection, stenosis (50% or
greater) or occlusion.

Skeleton: No acute osseous abnormality.

Other neck: Negative.

Upper chest: Negative.

Review of the MIP images confirms the above findings

CTA HEAD FINDINGS

Anterior circulation: Redemonstrated stent in the right petrous ICA.
Although beam hardening artifact limits evaluation for in stent
thrombosis, the stent is suspected to be patent given normal
contrast opacification of the right internal carotid carotid distal
to it, which is otherwise patent to the terminus. The left internal
carotid artery is patent to the termini without stenosis or other
abnormality.

A1 segments patent. Normal anterior communicating artery. Anterior
cerebral arteries are patent to their distal aspects.

No M1 stenosis or occlusion. Normal MCA bifurcations. Distal MCA
branches perfused and symmetric.

Posterior circulation: Vertebral arteries patent to the
vertebrobasilar junction without stenosis. Posterior inferior
cerebral arteries patent bilaterally.

Basilar patent to its distal aspect. Superior cerebellar arteries
patent bilaterally.

Hypoplastic right P1. Fetal origin of the left PCA and near fetal
origin of the right PCA, with patent posterior communicating
arteries. PCAs perfused to their distal aspects without stenosis.
The bilateral posterior communicating arteries are not visualized.

Venous sinuses: As permitted by contrast timing, patent.

Anatomic variants: Fetal origin of the left PCA near fetal origin of
the right PCA.

Review of the MIP images confirms the above findings
IMPRESSION: 1.  No acute intracranial process.
2.  No intracranial large vessel occlusion or significant stenosis.
3.  No hemodynamically significant stenosis in the neck.
4. Beam hardening artifact limits evaluation of the right petrous
ICA stent; however normal flow is seen in the ICA distal to the
stent, suggesting patency.

## 2022-02-09 MED ORDER — LORAZEPAM 2 MG/ML IJ SOLN: 1.0000 mg | Freq: Once | INTRAMUSCULAR | Status: DC

## 2022-02-09 MED ORDER — IOHEXOL 350 MG/ML SOLN
75.0000 mL | Freq: Once | INTRAVENOUS | Status: AC | PRN
  Administered 2022-02-09: 75 mL via INTRAVENOUS

## 2022-02-09 MED ORDER — METOCLOPRAMIDE HCL 10 MG PO TABS: 10.0000 mg | ORAL_TABLET | Freq: Four times a day (QID) | ORAL | 0 refills | Status: DC | PRN

## 2022-02-09 NOTE — ED Notes (Signed)
Patient verbalizes understanding of d/c instructions. Opportunities for questions and answers were provided. Pt d/c from ED and ambulated to lobby.  

## 2022-02-09 NOTE — ED Triage Notes (Signed)
Patient coming from home, complaint of headache x2 days, and change in vision this am approx 930. Pt state visual disturbance lasted approx 20 min and resolved on own, pt state took a nap and headache is gone. Hx stroke in Jan 2022. VSS. NAD. ?

## 2022-02-09 NOTE — ED Notes (Signed)
Pt stated she prefers to have MRI done outpatient, EDP notified. EDP also made aware that pt would like an update about her CT scan results. ?

## 2022-02-09 NOTE — ED Notes (Signed)
Patient transported to CT 

## 2022-02-09 NOTE — Discharge Instructions (Addendum)
Take tylenol for pain  ? ?Take reglan for headaches.  ? ?See Dr. Estanislado Pandy for follow up  ? ?Return to ER if you have worse headaches, vomiting, weakness, numbness, worse blurry vision  ?

## 2022-02-09 NOTE — ED Provider Triage Note (Signed)
Emergency Medicine Provider Triage Evaluation Note ? ?Dana Henry , a 41 y.o. female  was evaluated in triage.  Pt complains of headache and visual disturbance.  Patient reports that she has had a headache over the last 2 days.  Headache onset was gradual and pain had gotten progressively worse over time.  Today at 930 she had blurred vision and a halo of light to a portion of her left eye.  Symptoms lasted for approximately 20 minutes.  Patient took a nap and upon waking from her visual changes had resolved.  Patient states that she did feel little bit dizzy last night over the weekend woke up in the middle night to feel generalized numbness. ? ?Patient does have a history of CVA. ? ?Review of Systems  ?Positive: Headache, visual disturbance, dizziness, ?Negative: Patient asymmetry, dysarthria, weakness ? ?Physical Exam  ?BP 124/66 (BP Location: Right Arm)   Pulse 74   Temp 98 ?F (36.7 ?C) (Oral)   Resp (!) 22   SpO2 100%  ?Gen:   Awake, no distress   ?Resp:  Normal effort  ?MSK:   Moves extremities without difficulty  ?Other:  CN II through XII intact.  +5 strength to bilateral upper and lower extremities.  Pronator drift negative.  Sensation to light touch grossly intact to bilateral upper and lower extremities. ? ?Medical Decision Making  ?Medically screening exam initiated at 3:59 PM.  Appropriate orders placed.  Ameria Sanjurjo was informed that the remainder of the evaluation will be completed by another provider, this initial triage assessment does not replace that evaluation, and the importance of remaining in the ED until their evaluation is complete. ? ?Labs and imaging were discussed with attending physician Dr. Armandina Gemma.  We will start with basic lab work as well as noncontrast head CT. ?  ?Loni Beckwith, PA-C ?02/09/22 1600 ? ?

## 2022-02-09 NOTE — ED Provider Notes (Signed)
?[facility] ?Provider Note ? ? ?CSN: [id number] ?Arrival date & time: 02/09/22  1541 ? ?  ? ?History ? ?Chief Complaint  ?Patient presents with  ? Headache  ? Visual Field Change  ? ? ?Dana Henry is a 41 y.o. female history of previous stroke and carotid stenosis status post stent here presenting with headache and blurry vision.  Patient states that around 9 AM this morning, she has an episode of blurry vision.  She states that the left side of her visual field appears blurry.  She states that it lasted about 20 minutes.  She closed her eyes and the vision remains blurry.  She then fell asleep and slept for about 2 hours.  She states that she woke up from her nap and felt better.  She had a stroke last year with left-sided weakness at that time.  She had a carotid stent afterwards.  She denies any weakness or numbness or trouble speaking. ? ?The history is provided by the patient.  ? ?  ? ?Home Medications ?Prior to Admission medications   ?Medication Sig Start Date End Date Taking? Authorizing Provider  ?acetaminophen (TYLENOL) 500 MG tablet Take 1,000 mg by mouth every 6 (six) hours as needed for moderate pain.    [provider]  ?aspirin 81 MG chewable tablet Chew 1 tablet (81 mg total) by mouth daily. 11/21/20   Angiulli, Lavon Paganini, PA-C  ?atorvastatin (LIPITOR) 40 MG tablet Take 1 tablet (40 mg total) by mouth daily. 02/03/22   Copland, Gay Filler, MD  ?fluticasone (FLONASE) 50 MCG/ACT nasal spray Place 2 sprays into both nostrils daily. ?Patient not taking: Reported on 10/12/2021 09/10/21   Margarette Canada, NP  ?Multiple Vitamins-Minerals (MULTIVITAMIN WITH MINERALS) tablet Take 1 tablet by mouth daily.    [provider]  ?ticagrelor (BRILINTA) 90 MG TABS tablet Take 1 tablet (90 mg total) by mouth 2 (two) times daily. ?Patient taking differently: Take 45 mg by mouth 2 (two) times daily. 02/04/21   Garvin Fila, MD  ?   ? ?Allergies    ?Sulfa antibiotics    ? ?Review of Systems   ?Review of Systems  ?Neurological:  Positive for headaches.  ?All other systems reviewed and are negative. ? ?Physical Exam ?Updated Vital Signs ?BP 113/77   Pulse 66   Temp 98 ?F (36.7 ?C) (Oral)   Resp 14   Ht '5\' 4"'$  (1.626 m)   Wt 87.1 kg   SpO2 98%   BMI 32.96 kg/m?  ?Physical Exam ?Vitals and nursing note reviewed.  ?Constitutional:   ?   Appearance: She is well-developed.  ?HENT:  ?   Head: Normocephalic.  ?   Mouth/Throat:  ?   Mouth: Mucous membranes are moist.  ?   Pharynx: Oropharynx is clear.  ?Eyes:  ?   Extraocular Movements: Extraocular movements intact.  ?   Pupils: Pupils are equal, round, and reactive to light.  ?   Comments: No visual field cut.  Extraocular movements are intact  ?Cardiovascular:  ?   Rate and Rhythm: Normal rate and regular rhythm.  ?   Heart sounds: Normal heart sounds.  ?Pulmonary:  ?   Effort: Pulmonary effort is normal.  ?   Breath sounds: Normal breath sounds.  ?Abdominal:  ?   General: Bowel sounds are normal.  ?   Palpations: Abdomen is soft.  ?Musculoskeletal:     ?   General: Normal range of motion.  ?  Cervical back: Normal range of motion and neck supple.  ?Skin: ?   General: Skin is warm.  ?Neurological:  ?   Mental Status: She is alert and oriented to person, place, and time.  ?   Comments: Cranial nerves II to XII intact.  Patient has no obvious facial droop.  Patient has normal sensation bilateral arms and legs and normal finger-to-nose bilaterally.  Patient has negative Romberg sign and normal gait  ?Psychiatric:     ?   Mood and Affect: Mood normal.     ?   Behavior: Behavior normal.  ? ? ?ED Results / Procedures / Treatments   ?Labs ?(all labs ordered are listed, but only abnormal results are displayed) ?Labs Reviewed  ?COMPREHENSIVE METABOLIC PANEL - Abnormal; Notable for the following components:  ?    Result Value  ? Glucose, Bld 104 (*)   ? Creatinine, Ser 1.05 (*)   ? All other components within normal limits  ?I-STAT CHEM 8,  ED - Abnormal; Notable for the following components:  ? Glucose, Bld 105 (*)   ? All other components within normal limits  ?CBC WITH DIFFERENTIAL/PLATELET  ?I-STAT BETA HCG BLOOD, ED ([facility], [facility], [facility] ONLY)  ? ? ?EKG ?None ? ?Radiology ?CT ANGIO HEAD NECK W WO CM ? ?Result Date: 02/09/2022 ?CLINICAL DATA:  Headache and visual disturbance EXAM: CT ANGIOGRAPHY HEAD AND NECK TECHNIQUE: Multidetector CT imaging of the head and neck was performed using the standard protocol during bolus administration of intravenous contrast. Multiplanar CT image reconstructions and MIPs were obtained to evaluate the vascular anatomy. Carotid stenosis measurements (when applicable) are obtained utilizing NASCET criteria, using the distal internal carotid diameter as the denominator. RADIATION DOSE REDUCTION: This exam was performed according to the departmental dose-optimization program which includes automated exposure control, adjustment of the mA and/or kV according to patient size and/or use of iterative reconstruction technique. CONTRAST:  57m OMNIPAQUE IOHEXOL 350 MG/ML SOLN COMPARISON:  10/12/2021 CT head, 08/05/2021 CTA head neck FINDINGS: CT HEAD FINDINGS Brain: No acute infarct, hemorrhage, mass, mass effect, or midline shift. Redemonstrated encephalomalacia in the right basal ganglia and corona radiata, which appears similar to the prior CT. No hydrocephalus or extra-axial collection. Vascular: Please see CTA findings below. Skull: Normal. Negative for fracture or focal lesion. Sinuses: Imaged portions are clear. Orbits: No acute finding. 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 Imaged portion shows no evidence of aneurysm or dissection. No significant stenosis of the major arch vessel origins. Right carotid system: No evidence of dissection, stenosis (50% or greater) or  occlusion. Left carotid system: No evidence of dissection, stenosis (50% or greater) or occlusion. Vertebral arteries: No evidence of dissection, stenosis (50% or greater) or occlusion. Skeleton: No acute osseous abnormality. Other neck: Negative. Upper chest: Negative. Review of the MIP images confirms the above findings CTA HEAD FINDINGS Anterior circulation: Redemonstrated stent in the right petrous ICA. Although beam hardening artifact limits evaluation for in stent thrombosis, the stent is suspected to be patent given normal contrast opacification of the right internal carotid carotid distal to it, which is otherwise patent to the terminus. The left internal carotid artery is patent to the termini without stenosis or other abnormality. A1 segments patent. Normal anterior communicating artery. Anterior cerebral arteries are patent to their distal aspects. No M1 stenosis or occlusion. Normal MCA bifurcations.  Distal MCA branches perfused and symmetric. Posterior circulation: Vertebral arteries patent to the vertebrobasilar junction without stenosis. Posterior inferior cerebral arteries patent bilaterally. Basilar patent to its distal aspect. Superior cerebellar arteries patent bilaterally. Hypoplastic right P1. Fetal origin of the left PCA and near fetal origin of the right PCA, with patent posterior communicating arteries. PCAs perfused to their distal aspects without stenosis. The bilateral posterior communicating arteries are not visualized. Venous sinuses: As permitted by contrast timing, patent. Anatomic variants: Fetal origin of the left PCA near fetal origin of the right PCA. Review of the MIP images confirms the above findings IMPRESSION: 1.  No acute intracranial process. 2.  No intracranial large vessel occlusion or significant stenosis. 3.  No hemodynamically significant stenosis in the neck. 4. Beam hardening artifact limits evaluation of the right petrous ICA stent; however normal flow is seen in the  ICA distal to the stent, suggesting patency. Electronically Signed   By: Merilyn Baba M.D.   On: 02/09/2022 21:22   ? ?Procedures ?Procedures  ? ? ?Medications Ordered in ED ?Medications  ?iohexol (OMNIPAQUE) 35

## 2022-02-17 ENCOUNTER — Other Ambulatory Visit (HOSPITAL_COMMUNITY): Payer: Self-pay | Admitting: Interventional Radiology

## 2022-02-17 DIAGNOSIS — I729 Aneurysm of unspecified site: Secondary | ICD-10-CM

## 2022-02-17 DIAGNOSIS — I639 Cerebral infarction, unspecified: Secondary | ICD-10-CM

## 2022-02-18 ENCOUNTER — Telehealth (HOSPITAL_COMMUNITY): Payer: Self-pay

## 2022-02-18 NOTE — Telephone Encounter (Signed)
Caled to schedule cta, no answer, vm full. AW ?

## 2022-02-24 DIAGNOSIS — Z1231 Encounter for screening mammogram for malignant neoplasm of breast: Secondary | ICD-10-CM | POA: Diagnosis not present

## 2022-02-24 DIAGNOSIS — Z6832 Body mass index (BMI) 32.0-32.9, adult: Secondary | ICD-10-CM | POA: Diagnosis not present

## 2022-02-24 DIAGNOSIS — Z01419 Encounter for gynecological examination (general) (routine) without abnormal findings: Secondary | ICD-10-CM | POA: Diagnosis not present

## 2022-02-24 DIAGNOSIS — R87610 Atypical squamous cells of undetermined significance on cytologic smear of cervix (ASC-US): Secondary | ICD-10-CM | POA: Diagnosis not present

## 2022-03-02 LAB — HM PAP SMEAR: HM Pap smear: ABNORMAL

## 2022-03-02 LAB — RESULTS CONSOLE HPV: CHL HPV: NEGATIVE

## 2022-03-09 ENCOUNTER — Telehealth (HOSPITAL_COMMUNITY): Payer: Self-pay

## 2022-03-09 NOTE — Telephone Encounter (Signed)
Called to schedule cta, no answer, left vm. AW  

## 2022-03-11 ENCOUNTER — Encounter: Payer: Self-pay | Admitting: Family Medicine

## 2022-03-11 ENCOUNTER — Ambulatory Visit (INDEPENDENT_AMBULATORY_CARE_PROVIDER_SITE_OTHER): Payer: BC Managed Care – PPO | Admitting: Family Medicine

## 2022-03-11 VITALS — BP 120/78 | HR 60 | Ht 64.0 in | Wt 193.0 lb

## 2022-03-11 DIAGNOSIS — I63231 Cerebral infarction due to unspecified occlusion or stenosis of right carotid arteries: Secondary | ICD-10-CM | POA: Diagnosis not present

## 2022-03-11 DIAGNOSIS — Z1159 Encounter for screening for other viral diseases: Secondary | ICD-10-CM | POA: Diagnosis not present

## 2022-03-11 DIAGNOSIS — Z13220 Encounter for screening for lipoid disorders: Secondary | ICD-10-CM

## 2022-03-11 DIAGNOSIS — Z0000 Encounter for general adult medical examination without abnormal findings: Secondary | ICD-10-CM | POA: Diagnosis not present

## 2022-03-11 DIAGNOSIS — R7989 Other specified abnormal findings of blood chemistry: Secondary | ICD-10-CM

## 2022-03-11 NOTE — Assessment & Plan Note (Signed)
Patient with history of CVA from 10/2020 timeframe, fortunately has no significant residual sequela from a neurologic or motor standpoint other than transient left-sided weakness and reported occasional memory issues.  She regularly follows up with both neurology and interventional radiology, the latter for her stents.  She is on chronic anticoagulation with aspirin and Brilinta.

## 2022-03-11 NOTE — Progress Notes (Signed)
Annual Physical Exam Visit  Patient Information:  Patient ID: Dana Henry, female DOB: [birth year] Age: 41 y.o. MRN: [id number]   Subjective:   CC: Annual Physical Exam  HPI:  Dana Henry is here for their annual physical.  I reviewed the past medical history, family history, social history, surgical history, and allergies today and changes were made as necessary.  Please see the problem list section below for additional details.  Past Medical History: Past Medical History:  Diagnosis Date   Anemia    CVA (cerebral vascular accident) ([facility])    History of cesarean delivery affecting pregnancy 08/10/2017   History of kidney stones    surgically removed   Hyperlipidemia    Migraines    associated with menstrual cycle   Pneumonia    UTI (urinary tract infection)    Vaginal Pap smear, abnormal    Past Surgical History: Past Surgical History:  Procedure Laterality Date   BUBBLE STUDY  11/18/2020   Procedure: BUBBLE STUDY;  Surgeon: Elouise Munroe, MD;  Location: [facility];  Service: Cardiology;;   CESAREAN SECTION N/A 07/21/2014   Procedure: CESAREAN SECTION;  Surgeon: Elveria Royals, MD;  Location: [facility];  Service: Obstetrics;  Laterality: N/A;   CESAREAN SECTION N/A 08/10/2017   Procedure: Repeat CESAREAN SECTION;  Surgeon: Azucena Fallen, MD;  Location: [facility];  Service: Obstetrics;  Laterality: N/A;  EDD: 08/17/17 Allergy: Sulfa   IR ANGIO INTRA EXTRACRAN SEL COM CAROTID INNOMINATE BILAT MOD SED  01/16/2021   IR CT HEAD LTD  11/13/2020   IR INTRA CRAN STENT  11/13/2020   IR INTRA CRAN STENT  02/05/2021   IR PERCUTANEOUS ART THROMBECTOMY/INFUSION INTRACRANIAL INC DIAG ANGIO  11/13/2020   IR RADIOLOGIST EVAL & MGMT  01/21/2021   IR RADIOLOGIST EVAL & MGMT  02/22/2021   IR TRANSCATH/EMBOLIZ  02/05/2021   IR US GUIDE VASC ACCESS RIGHT  02/05/2021   KIDNEY STONE SURGERY     RADIOLOGY WITH ANESTHESIA N/A 11/13/2020   Procedure: IR WITH ANESTHESIA;  Surgeon:  Radiologist, Medication, MD;  Location: [facility];  Service: Radiology;  Laterality: N/A;   RADIOLOGY WITH ANESTHESIA N/A 02/05/2021   Procedure: MRI WITH ANESTHESIA EMBOLIZATION;  Surgeon: Luanne Bras, MD;  Location: [facility];  Service: Radiology;  Laterality: N/A;   TEE WITHOUT CARDIOVERSION N/A 11/18/2020   Procedure: TRANSESOPHAGEAL ECHOCARDIOGRAM (TEE);  Surgeon: Elouise Munroe, MD;  Location: [facility];  Service: Cardiology;  Laterality: N/A;   TONSILLECTOMY AND ADENOIDECTOMY  1991   Family History: Family History  Problem Relation Age of Onset   Hypertension Mother    Alcohol abuse Father    Allergies: Allergies  Allergen Reactions   Sulfa Antibiotics Hives and Swelling   Health Maintenance: Health Maintenance  Topic Date Due   Hepatitis C Screening  Never done   PAP SMEAR-Modifier  10/09/2019   COVID-19 Vaccine (3 - Booster for Pfizer series) 05/27/2020   INFLUENZA VACCINE  05/19/2022   MAMMOGRAM  02/25/2023   TETANUS/TDAP  06/02/2027   HIV Screening  Completed   HPV VACCINES  Aged Out    HM Colonoscopy     This patient has no relevant Health Maintenance data.      Medications: Current Outpatient Medications on File Prior to Visit  Medication Sig Dispense Refill   acetaminophen (TYLENOL) 500 MG tablet Take 1,000 mg by mouth every 6 (six) hours as needed for moderate pain.     aspirin 81 MG chewable tablet Chew  1 tablet (81 mg total) by mouth daily.     atorvastatin (LIPITOR) 40 MG tablet Take 1 tablet (40 mg total) by mouth daily. 90 tablet 1   BIOTIN PO Take by mouth daily.     metoCLOPramide (REGLAN) 10 MG tablet Take 10 mg by mouth as needed for nausea.     Multiple Vitamins-Minerals (MULTIVITAMIN WITH MINERALS) tablet Take 1 tablet by mouth daily.     ticagrelor (BRILINTA) 90 MG TABS tablet Take 1 tablet (90 mg total) by mouth 2 (two) times daily. (Patient taking differently: Take 45 mg by mouth 2 (two) times daily.) 60 tablet 5   No current  facility-administered medications on file prior to visit.    Review of Systems: No headache, visual changes, nausea, vomiting, diarrhea, constipation, dizziness, abdominal pain, skin rash, fevers, chills, night sweats, swollen lymph nodes, weight loss, chest pain, body aches, joint swelling, muscle aches, shortness of breath, mood changes, visual or auditory hallucinations reported.  Objective:   Vitals:   03/11/22 0905  BP: 120/78  Pulse: 60  SpO2: 98%   Vitals:   03/11/22 0905  Weight: 193 lb (87.5 kg)  Height: '5\' 4"'$  (1.626 m)   Body mass index is 33.13 kg/m.  General: Well Developed, well nourished, and in no acute distress.  Neuro: Alert and oriented x3, extra-ocular muscles intact, sensation grossly intact. Cranial nerves II through XII are grossly intact, motor, sensory, and coordinative functions are intact. HEENT: Normocephalic, atraumatic, pupils equal round reactive to light, neck supple, no masses, no lymphadenopathy, thyroid nonpalpable. Oropharynx, nasopharynx, external ear canals are unremarkable. Skin: Warm and dry, no rashes noted.  Cardiac: Regular rate and rhythm, no murmurs rubs or gallops. No peripheral edema. Pulses symmetric. Respiratory: Clear to auscultation bilaterally. Not using accessory muscles, speaking in full sentences.  Abdominal: Soft, nontender, nondistended, positive bowel sounds, no masses, no organomegaly. Musculoskeletal: Shoulder, elbow, wrist, hip, knee, ankle stable, and with full range of motion.  Female chaperone initials: KP present throughout the physical examination.  Impression and Recommendations:   The patient was counselled, risk factors were discussed, and anticipatory guidance given.  Problem List Items Addressed This Visit       Cardiovascular and Mediastinum   Stroke (cerebrum) [facility])    Patient with history of CVA from 10/2020 timeframe, fortunately has no significant residual sequela from a neurologic or motor standpoint  other than transient left-sided weakness and reported occasional memory issues.  She regularly follows up with both neurology and interventional radiology, the latter for her stents.  She is on chronic anticoagulation with aspirin and Brilinta.       Relevant Orders   Apo A1 + B + Ratio   CBC   Comprehensive metabolic panel   Lipid panel     Other   Annual physical exam - Primary    Annual examination completed, risk stratification labs ordered, anticipatory guidance provided.  We will follow labs once resulted.  Will obtain outside records in regards to cervical cancer screening results as she is established with OB/GYN.       Relevant Orders   Apo A1 + B + Ratio   CBC   Comprehensive metabolic panel   Lipid panel   TSH   VITAMIN D 25 Hydroxy (Vit-D Deficiency, Fractures)   Hepatitis C antibody   Other Visit Diagnoses     Screening for lipoid disorders       Relevant Orders   Apo A1 + B + Ratio   Lipid panel  Need for hepatitis C screening test       Relevant Orders   Hepatitis C antibody   Low serum vitamin D       Relevant Orders   VITAMIN D 25 Hydroxy (Vit-D Deficiency, Fractures)        Orders & Medications Medications: No orders of the defined types were placed in this encounter.  Orders Placed This Encounter  Procedures   Apo A1 + B + Ratio   CBC   Comprehensive metabolic panel   Lipid panel   TSH   VITAMIN D 25 Hydroxy (Vit-D Deficiency, Fractures)   Hepatitis C antibody     Return in about 6 months (around 09/11/2022).    Montel Culver, MD   [facility]

## 2022-03-11 NOTE — Patient Instructions (Signed)
-   Obtain fasting labs with orders provided (can have water or black coffee but otherwise no food or drink x 8 hours before labs) - Review information provided - Attend eye doctor annually, dentist every 6 months, work towards or maintain 30 minutes of moderate intensity physical activity at least 5 days per week, and consume a balanced diet - Return in 6 months - Contact us for any questions between now and then 

## 2022-03-11 NOTE — Assessment & Plan Note (Signed)
Annual examination completed, risk stratification labs ordered, anticipatory guidance provided.  We will follow labs once resulted.  Will obtain outside records in regards to cervical cancer screening results as she is established with OB/GYN.

## 2022-04-08 ENCOUNTER — Telehealth (HOSPITAL_COMMUNITY): Payer: Self-pay

## 2022-04-08 NOTE — Telephone Encounter (Signed)
Called to schedule cta, no answer, left vm. AW  

## 2022-04-09 DIAGNOSIS — N870 Mild cervical dysplasia: Secondary | ICD-10-CM | POA: Diagnosis not present

## 2022-04-09 DIAGNOSIS — Z3202 Encounter for pregnancy test, result negative: Secondary | ICD-10-CM | POA: Diagnosis not present

## 2022-04-09 DIAGNOSIS — R87610 Atypical squamous cells of undetermined significance on cytologic smear of cervix (ASC-US): Secondary | ICD-10-CM | POA: Diagnosis not present

## 2022-05-19 ENCOUNTER — Encounter: Payer: Self-pay | Admitting: Neurology

## 2022-05-19 ENCOUNTER — Telehealth: Payer: Self-pay | Admitting: Neurology

## 2022-05-19 NOTE — Telephone Encounter (Signed)
Pt  is calling and requesting a refill on ticagrelor (BRILINTA) 90 MG TABS tablet. Notified Pt she haven't had an appointment since 4/22 and a appointment will be needed. Pt said she had been doing fine and didn't need to see doctor and all she need is her medication. Pt was scheduled a appointment on 11/13.

## 2022-05-19 NOTE — Telephone Encounter (Signed)
She was last seen 01/22/22 with the following plan from Dr. Leonie Man:    Continue aspirin 81 mg daily and Brilinta (ticagrelor) 90 mg bid  for secondary stroke prevention for 6 months and then aspirin alone and maintain strict control of hypertension with blood pressure goal below 130/90, diabetes with hemoglobin A1c goal below 6.5% and lipids with LDL cholesterol goal below 70 mg/dL. I also advised the patient to eat a healthy diet with plenty of whole grains, cereals, fruits and vegetables, exercise regularly and maintain ideal body weight.  Check follow-up lipid profile.  Follow-up with Dr. Estanislado Pandy for right carotid pseudoaneurysm treatment.  Patient was cleared to drive and she may return back to work as well.  Followup in the future with my nurse practitioner Janett Billow in 6 months or call earlier if necessary. ____________________________________ The patient never returned to our office. I called her today. She tells me Dr. Estanislado Pandy instructed her to continue the Brilinta '90mg'$  but taking 0.5 tab BID. She has continued this medication. She does not have a follow up until November with Dr. Tilden Dome. She will contact Dr. Arlean Hopping office for further instruction on [place] until her updated evaluation with our office.

## 2022-05-19 NOTE — Telephone Encounter (Signed)
She was last seen 01/22/22 with the following plan from Dr. Leonie Man:   Continue aspirin 81 mg daily and Brilinta (ticagrelor) 90 mg bid  for secondary stroke prevention for 6 months and then aspirin alone and maintain strict control of hypertension with blood pressure goal below 130/90, diabetes with hemoglobin A1c goal below 6.5% and lipids with LDL cholesterol goal below 70 mg/dL. I also advised the patient to eat a healthy diet with plenty of whole grains, cereals, fruits and vegetables, exercise regularly and maintain ideal body weight.  Check follow-up lipid profile.  Follow-up with Dr. Estanislado Pandy for right carotid pseudoaneurysm treatment.  Patient was cleared to drive and she may return back to work as well.  Followup in the future with my nurse practitioner Janett Billow in 6 months or call earlier if necessary. ____________________________________ The patient never returned to our office. I called her today. She tells me Dr. Estanislado Pandy instructed her to continue the Brilinta '90mg'$  but taking 0.5 tab BID. She has continued this medication. She does not have a follow up until November with Dr. Tilden Dome. She will contact Dr. Arlean Hopping office for further instruction on [place] until her updated evaluation with our office.

## 2022-05-20 ENCOUNTER — Telehealth (HOSPITAL_COMMUNITY): Payer: Self-pay

## 2022-05-20 ENCOUNTER — Telehealth (HOSPITAL_COMMUNITY): Payer: Self-pay | Admitting: Student

## 2022-05-20 NOTE — Telephone Encounter (Signed)
Pt called for a brilinta refill. I've sent a message to our PA. AW

## 2022-05-20 NOTE — Telephone Encounter (Signed)
Patient called and left message for IR to return call with information regarding next follow up and whether or not to continue [place]. Dr. Estanislado Pandy wants to see the patient in 6 month for follow up CTA (a scheduler will call her to arrange date/time) and he does want her to continue taking Brilinta plus 81 mg aspirin. I called the patient and left a VM for her to call me back. IR has not refilled prescription for Brilinta since 02/04/21. I need to verify if she is taking 45 mg BID, which pharmacy she uses and if she has run out how long has it been since she stopped taking it.   Soyla Dryer, [place] [phone] 05/20/2022, 1:16 PM

## 2022-05-21 ENCOUNTER — Other Ambulatory Visit: Payer: Self-pay | Admitting: Internal Medicine

## 2022-05-21 MED ORDER — TICAGRELOR 90 MG PO TABS: 45.0000 mg | ORAL_TABLET | Freq: Two times a day (BID) | ORAL | 2 refills | Status: DC

## 2022-08-25 ENCOUNTER — Other Ambulatory Visit: Payer: Self-pay | Admitting: Family Medicine

## 2022-08-31 ENCOUNTER — Encounter: Payer: Self-pay | Admitting: Neurology

## 2022-08-31 ENCOUNTER — Ambulatory Visit (INDEPENDENT_AMBULATORY_CARE_PROVIDER_SITE_OTHER): Payer: BC Managed Care – PPO | Admitting: Neurology

## 2022-08-31 VITALS — BP 126/63 | HR 67 | Ht 64.0 in | Wt 194.0 lb

## 2022-08-31 DIAGNOSIS — Z95828 Presence of other vascular implants and grafts: Secondary | ICD-10-CM

## 2022-08-31 DIAGNOSIS — I63031 Cerebral infarction due to thrombosis of right carotid artery: Secondary | ICD-10-CM | POA: Diagnosis not present

## 2022-08-31 NOTE — Progress Notes (Signed)
[facility] [address] [place]. [place] [address] [phone]       OFFICE FOLLOW-UP NOTE  [name] Date of Birth:  [birth year] Medical Record Number:  [id number]   HPI: Initial visit/03/2021 Ms. Trafton is a pleasant 41 year old Caucasian lady seen today for initial office follow-up visit following hospital admission for stroke in January 2022.  History is obtained from the patient, review of electronic medical records and I personally reviewed pertinent imaging imaging films in PACS.  She has past medical history of migraines, Covid infection in December 2021 who presented as a code stroke on 11/13/2020.  She complained of left-sided numbness and weakness while at [facility] along with heaviness.  She was able to walk and husband was sick so she did not want to bother him.  Next day her symptoms got worse with gaze deviation and left facial droop and left-sided weakness and EMS was called.  CT scan of the head showed right caudate and basal ganglia subacute infarcts and CT angiogram of the head and neck showed right petrous segment occlusion versus high-grade stenosis.  CT perfusion showed a large penumbra on the right without any core infarct.  Patient underwent successful mechanical thrombectomy of the occluded right middle cerebral artery with  TICI 3 revascularization by Dr. Estanislado Pandy which also needed rescue right angioplasty and stenting.Marland Kitchen  She was admitted to the intensive care unit and blood pressure was tightly controlled.  She was extubated and did well.  Subsequent MRI scan of the brain showed patchy multifocal acute right MCA territory infarcts involving subcortical and deep white matter of the right frontal and parietal lobes mostly watershed distribution.  MRA showed patent right petrous ICA with a stent with preserved distal flow.  2D echo showed ejection fraction of 60 to 65%.  Lower extremity venous Dopplers were negative for DVT.  Transcranial Doppler  bubble study was positive for a small Spencer grade 2 right-to-left shunt however TEE showed a redundant atrial septum with possible color-flow but no right-to-left shunt.  LDL cholesterol is 96 mg percent.  Hemoglobin A1c was 5.1.  Urine drug screen was positive for cannabis.  Hypercoagulable panel as well as autoimmune labs were all negative.  Patient was started on aspirin and Brilinta.  She states she is done well.  She has only mild weakness in her hand with diminished fine motor skills but is able to walk well and speech is back to normal.  She is currently doing outpatient occupational therapy only and has finished physical therapy.  She is tolerating aspirin and Brilinta well with only minor bruising and no bleeding.  Blood pressures well controlled and today it is 120/68.  She was started on Prozac for anxiety by primary physician and that seems to be helping.  She had a follow-up angiogram done by Dr. 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  There is also a small 3.6 x 2.4 mm outpouching of the left posterior communicating artery possibly an infundibulum versus small aneurysm.  Patient is scheduled for elective pseudoaneurysm coiling and treatment by Dr. Estanislado Pandy soon.  She has no new complaints.  She is tolerating Lipitor well without muscle aches and pains.  She has not had any follow-up lipid profile checked. Update 08/31/2022 : She returns for follow-up after last visit a year and a half ago.  She states she is doing  well.  She has had no recurrent stroke or TIA symptoms.  She is obtaining improvement in left hand strength and fine motor skills.  She remains on aspirin and Brilinta and is tolerating both medications well with very minimal bruising and no bleeding episodes.  She had follow-up CT angiogram done on 02/09/2022 which showed stent artifact in the petrous right  carotid artery but preserved distal flow.  He has not had lipid profile checked for quite some time.  She is tolerating Lipitor well without muscle aches and pains.  He has no complaints today.  She has had no recurrent stroke or TIA symptoms.  He has not had any follow-up with Dr. Estanislado Pandy cardiologist. ROS:   14 system review of systems is positive for no complaints today and all other systems negative  PMH:  Past Medical History:  Diagnosis Date   Anemia    CVA (cerebral vascular accident) ([place])    History of cesarean delivery affecting pregnancy 08/10/2017   History of kidney stones    surgically removed   Hyperlipidemia    Migraines    associated with menstrual cycle   Pneumonia    UTI (urinary tract infection)    Vaginal Pap smear, abnormal     Social History:  Social History   Socioeconomic History   Marital status: Married    Spouse name: Nilsa Macht   Number of children: 2   Years of education: College   Highest education level: Not on file  Occupational History   Occupation: sales  Tobacco Use   Smoking status: Former    Packs/day: 0.50    Types: Cigarettes    Quit date: 10/19/2013    Years since quitting: 8.8   Smokeless tobacco: Never  Vaping Use   Vaping Use: Never used  Substance and Sexual Activity   Alcohol use: Yes    Comment: occassional   Drug use: No   Sexual activity: Yes    Birth control/protection: None  Other Topics Concern   Not on file  Social History Narrative   Lives in [place] with husband and mother and father and mother in [place]. Daughter 41month, [employer]      Right-handed.      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 [employer]     Diet - regular   Exercise - occasional walks      Caffinated Beverages: Yes - 2 cups per day   Herbal Remedies: No   Seat Belts: Yes   Bike Helmet: Yes   Exercise 3 Times a Week: No   Vegetarian: No   Eat Dairy Products:   Take Vitamins: Yes   Use Hearing Aid: No   Wear Dentures: No   Smoke Alarms  in Home: Yes   Guns/Firearms: No   Physical Abuse: No      Hours of Sleep: 6-7   # of People in Home: 3               Social Determinants of Health   Financial Resource Strain: Not on file  Food Insecurity: Not on file  Transportation Needs: Not on file  Physical Activity: Not on file  Stress: Not on file  Social Connections: Not on file  Intimate Partner Violence: Not on file    Medications:   Current Outpatient Medications on File Prior to Visit  Medication Sig Dispense Refill   acetaminophen (TYLENOL) 500 MG tablet Take 1,000 mg by mouth every 6 (six) hours as needed for moderate pain.  aspirin 81 MG chewable tablet Chew 1 tablet (81 mg total) by mouth daily.     atorvastatin (LIPITOR) 40 MG tablet Take 1 tablet (40 mg total) by mouth daily. 90 tablet 1   BIOTIN PO Take by mouth daily.     Multiple Vitamins-Minerals (MULTIVITAMIN WITH MINERALS) tablet Take 1 tablet by mouth daily.     ticagrelor (BRILINTA) 90 MG TABS tablet Take 0.5 tablets (45 mg total) by mouth 2 (two) times daily. 30 tablet 2   metoCLOPramide (REGLAN) 10 MG tablet Take 10 mg by mouth as needed for nausea.     No current facility-administered medications on file prior to visit.    Allergies:   Allergies  Allergen Reactions   Sulfa Antibiotics Hives and Swelling    Physical Exam General: Mildly obese middle-aged Caucasian lady seated, in no evident distress Head: head normocephalic and atraumatic.  Neck: supple with no carotid or supraclavicular bruits Cardiovascular: regular rate and rhythm, no murmurs Musculoskeletal: no deformity Skin:  no rash/petichiae Vascular:  Normal pulses all extremities Vitals:   08/31/22 1415  BP: 126/63  Pulse: 67   Neurologic Exam Mental Status: Awake and fully alert. Oriented to place and time. Recent and remote memory intact. Attention span, concentration and fund of knowledge appropriate. Mood and affect appropriate.  Cranial Nerves: Fundoscopic exam  not done today. Pupils equal, briskly reactive to light. Extraocular movements full without nystagmus. Visual fields full to confrontation. Hearing intact. Facial sensation intact. Face, tongue, palate moves normally and symmetrically.  Motor: Normal bulk and tone. Normal strength in all tested extremity muscles.  Diminished fine finger movements on the left.  Orbits right over left upper extremity.  No grip weakness. Sensory.: intact to touch ,pinprick .position and vibratory sensation.  Coordination: Rapid alternating movements normal in all extremities. Finger-to-nose and heel-to-shin performed accurately bilaterally. Gait and Station: Arises from chair without difficulty. Stance is normal. Gait demonstrates normal stride length and balance . Able to heel, toe and tandem walk without difficulty.  Reflexes: 1+ and symmetric. Toes downgoing.      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  No significant vascular risk factors except mild hyper lipidemia.  Small PFO noted on TCD bubble study but not visualized on TEE.     PLAN: I had a long d/w patient about her remote stroke and carotid occlusion and stent, stroke, risk for recurrent stroke/TIAs, personally independently reviewed imaging studies and stroke evaluation results and answered questions.Continue aspirin 81 mg daily and stop Brilinta now as it has been a year and a half since her carotid stent for secondary stroke prevention and maintain strict control of hypertension with blood pressure goal below 130/90, diabetes with hemoglobin A1c goal below 6.5% and lipids with LDL cholesterol goal below 70 mg/dL. I also advised the patient to eat a healthy diet with plenty of whole grains, cereals, fruits and vegetables, exercise regularly and maintain ideal body  weight .Check lipid profile and hemoglobin A1c today.  Followup in the future with me in 1 year or call earlier if necessary. Greater than 50% of time during this 35 minute visit was spent on counseling,explanation of diagnosis carotid occlusion and stent and stroke, planning of further management, discussion with patient and family and coordination of care Antony Contras, MD Note: This document was prepared with digital dictation and possible smart phrase technology. Any transcriptional errors  that result from this process are unintentional

## 2022-08-31 NOTE — Patient Instructions (Signed)
I had a long d/w patient about her remote stroke and carotid occlusion and stent, stroke, risk for recurrent stroke/TIAs, personally independently reviewed imaging studies and stroke evaluation results and answered questions.Continue aspirin 81 mg daily and stop Brilinta now as it has been a year and a half since her carotid stent for secondary stroke prevention and maintain strict control of hypertension with blood pressure goal below 130/90, diabetes with hemoglobin A1c goal below 6.5% and lipids with LDL cholesterol goal below 70 mg/dL. I also advised the patient to eat a healthy diet with plenty of whole grains, cereals, fruits and vegetables, exercise regularly and maintain ideal body weight .Check lipid profile and hemoglobin A1c today.  Followup in the future with me in 1 year or call earlier if necessary.  Mediterranean Diet A Mediterranean diet refers to food and lifestyle choices that are based on the traditions of countries located on the [employer]. It focuses on eating more fruits, vegetables, whole grains, beans, nuts, seeds, and heart-healthy fats, and eating less dairy, meat, eggs, and processed foods with added sugar, salt, and fat. This way of eating has been shown to help prevent certain conditions and improve outcomes for people who have chronic diseases, like kidney disease and heart disease. What are tips for following this plan? Reading food labels Check the serving size of packaged foods. For foods such as rice and pasta, the serving size refers to the amount of cooked product, not dry. Check the total fat in packaged foods. Avoid foods that have saturated fat or trans fats. Check the ingredient list for added sugars, such as corn syrup. Shopping  Buy a variety of foods that offer a balanced diet, including: Fresh fruits and vegetables (produce). Grains, beans, nuts, and seeds. Some of these may be available in unpackaged forms or large amounts (in bulk). Fresh  seafood. Poultry and eggs. Low-fat dairy products. Buy whole ingredients instead of prepackaged foods. Buy fresh fruits and vegetables in-season from local farmers markets. Buy plain frozen fruits and vegetables. If you do not have access to quality fresh seafood, buy precooked frozen shrimp or canned fish, such as tuna, salmon, or sardines. Stock your pantry so you always have certain foods on hand, such as olive oil, canned tuna, canned tomatoes, rice, pasta, and beans. Cooking Cook foods with extra-virgin olive oil instead of using butter or other vegetable oils. Have meat as a side dish, and have vegetables or grains as your main dish. This means having meat in small portions or adding small amounts of meat to foods like pasta or stew. Use beans or vegetables instead of meat in common dishes like chili or lasagna. Experiment with different cooking methods. Try roasting, broiling, steaming, and sauting vegetables. Add frozen vegetables to soups, stews, pasta, or rice. Add nuts or seeds for added healthy fats and plant protein at each meal. You can add these to yogurt, salads, or vegetable dishes. Marinate fish or vegetables using olive oil, lemon juice, garlic, and fresh herbs. Meal planning Plan to eat one vegetarian meal one day each week. Try to work up to two vegetarian meals, if possible. Eat seafood two or more times a week. Have healthy snacks readily available, such as: Vegetable sticks with hummus. Greek yogurt. Fruit and nut trail mix. Eat balanced meals throughout the week. This includes: Fruit: 2-3 servings a day. Vegetables: 4-5 servings a day. Low-fat dairy: 2 servings a day. Fish, poultry, or lean meat: 1 serving a day. Beans and legumes: 2 or  more servings a week. Nuts and seeds: 1-2 servings a day. Whole grains: 6-8 servings a day. Extra-virgin olive oil: 3-4 servings a day. Limit red meat and sweets to only a few servings a month. Lifestyle  Cook and eat meals  together with your family, when possible. Drink enough fluid to keep your urine pale yellow. Be physically active every day. This includes: Aerobic exercise like running or swimming. Leisure activities like gardening, walking, or housework. Get 7-8 hours of sleep each night. If recommended by your health care provider, drink red wine in moderation. This means 1 glass a day for nonpregnant women and 2 glasses a day for men. A glass of wine equals 5 oz (150 mL). What foods should I eat? Fruits Apples. Apricots. Avocado. Berries. Bananas. Cherries. Dates. Figs. Grapes. Lemons. Melon. Oranges. Peaches. Plums. Pomegranate. Vegetables Artichokes. Beets. Broccoli. Cabbage. Carrots. Eggplant. Green beans. Chard. Kale. Spinach. Onions. Leeks. Peas. Squash. Tomatoes. Peppers. Radishes. Grains Whole-grain pasta. Brown rice. Bulgur wheat. Polenta. Couscous. Whole-wheat bread. [place]. Meats and other proteins Beans. Almonds. Sunflower seeds. Pine nuts. Peanuts. [place]. Salmon. Scallops. Shrimp. [place]. Tilapia. Clams. Oysters. Eggs. Poultry without skin. Dairy Low-fat milk. Cheese. Greek yogurt. Fats and oils Extra-virgin olive oil. Avocado oil. Grapeseed oil. Beverages Water. Red wine. Herbal tea. Sweets and desserts Greek yogurt with honey. Baked apples. Poached pears. Trail mix. Seasonings and condiments Basil. Cilantro. Coriander. Cumin. Mint. Parsley. Sage. Rosemary. Tarragon. Garlic. Oregano. Thyme. Pepper. Balsamic vinegar. Tahini. Hummus. Tomato sauce. Olives. Mushrooms. The items listed above may not be a complete list of foods and beverages you can eat. Contact a dietitian for more information. What foods should I limit? This is a list of foods that should be eaten rarely or only on special occasions. Fruits Fruit canned in syrup. Vegetables Deep-fried potatoes (french fries). Grains Prepackaged pasta or rice dishes. Prepackaged cereal with added sugar. Prepackaged snacks with  added sugar. Meats and other proteins Beef. Pork. Lamb. Poultry with skin. Hot dogs. Berniece Salines. Dairy Ice cream. Sour cream. Whole milk. Fats and oils Butter. Canola oil. Vegetable oil. Beef fat (tallow). Lard. Beverages Juice. Sugar-sweetened soft drinks. Beer. Liquor and spirits. Sweets and desserts Cookies. Cakes. Pies. Candy. Seasonings and condiments Mayonnaise. Pre-made sauces and marinades. The items listed above may not be a complete list of foods and beverages you should limit. Contact a dietitian for more information. Summary The Mediterranean diet includes both food and lifestyle choices. Eat a variety of fresh fruits and vegetables, beans, nuts, seeds, and whole grains. Limit the amount of red meat and sweets that you eat. If recommended by your health care provider, drink red wine in moderation. This means 1 glass a day for nonpregnant women and 2 glasses a day for men. A glass of wine equals 5 oz (150 mL). This information is not intended to replace advice given to you by your health care provider. Make sure you discuss any questions you have with your health care provider. Document Revised: 11/10/2019 Document Reviewed: 09/07/2019 Elsevier Patient Education  [place].

## 2022-09-01 LAB — LIPID PANEL
Chol/HDL Ratio: 2.3 ratio (ref 0.0–4.4)
Cholesterol, Total: 110 mg/dL (ref 100–199)
HDL: 48 mg/dL (ref >39)
LDL Chol Calc (NIH): 41 mg/dL (ref 0–99)
Triglycerides: 119 mg/dL (ref 0–149)
VLDL Cholesterol Cal: 21 mg/dL (ref 5–40)

## 2022-09-01 LAB — HEMOGLOBIN A1C
Est. average glucose Bld gHb Est-mCnc: 111 mg/dL
Hgb A1c MFr Bld: 5.5 % (ref 4.8–5.6)

## 2022-09-03 NOTE — Progress Notes (Signed)
Kindly inform the patient that lab work for cholesterol and screening test for diabetes were both satisfactory

## 2022-09-14 ENCOUNTER — Encounter: Payer: Self-pay | Admitting: Family Medicine

## 2022-09-14 ENCOUNTER — Ambulatory Visit (INDEPENDENT_AMBULATORY_CARE_PROVIDER_SITE_OTHER): Payer: BC Managed Care – PPO | Admitting: Family Medicine

## 2022-09-14 VITALS — BP 120/78 | HR 76 | Ht 64.0 in | Wt 198.2 lb

## 2022-09-14 DIAGNOSIS — I63231 Cerebral infarction due to unspecified occlusion or stenosis of right carotid arteries: Secondary | ICD-10-CM | POA: Diagnosis not present

## 2022-09-14 DIAGNOSIS — G43909 Migraine, unspecified, not intractable, without status migrainosus: Secondary | ICD-10-CM | POA: Diagnosis not present

## 2022-09-14 DIAGNOSIS — E669 Obesity, unspecified: Secondary | ICD-10-CM

## 2022-09-14 DIAGNOSIS — Z1159 Encounter for screening for other viral diseases: Secondary | ICD-10-CM | POA: Diagnosis not present

## 2022-09-14 DIAGNOSIS — Z23 Encounter for immunization: Secondary | ICD-10-CM | POA: Diagnosis not present

## 2022-09-14 DIAGNOSIS — Z0000 Encounter for general adult medical examination without abnormal findings: Secondary | ICD-10-CM | POA: Diagnosis not present

## 2022-09-14 DIAGNOSIS — Z6834 Body mass index (BMI) 34.0-34.9, adult: Secondary | ICD-10-CM

## 2022-09-14 DIAGNOSIS — Z13220 Encounter for screening for lipoid disorders: Secondary | ICD-10-CM | POA: Diagnosis not present

## 2022-09-14 DIAGNOSIS — R7989 Other specified abnormal findings of blood chemistry: Secondary | ICD-10-CM | POA: Diagnosis not present

## 2022-09-14 NOTE — Assessment & Plan Note (Signed)
Annual influenza vaccination administered today. 

## 2022-09-14 NOTE — Assessment & Plan Note (Addendum)
Has had essential resolution of residual left-sided weakness, minor fine motor movement components relayed.  She recently followed up with her neurologist Dr. Leonie Man with [facility], advised 1 year follow-up.  She has some questions about anticoagulation, has recently finished course and plans to reestablish with interventional radiology group who originally managed her anticoagulation regimen.  We will follow peripherally on this issue.

## 2022-09-14 NOTE — Patient Instructions (Addendum)
-   Obtain fasting labs with orders provided - Track calories, focus on high-quality sleep, and varied exercises - Review information provided - Return in 2 months

## 2022-09-14 NOTE — Progress Notes (Signed)
     Primary Care / Sports Medicine Office Visit  Patient Information:  Patient ID: Dana Henry, female DOB: 1981/08/01 Age: 41 y.o. MRN: [id number]   Dana Henry is a pleasant 41 y.o. female presenting with the following:  Chief Complaint  Patient presents with   Lab follow up    Vitals:   09/14/22 0838  BP: 120/78  Pulse: 76  SpO2: 99%   Vitals:   09/14/22 0838  Weight: 198 lb 3.2 oz (89.9 kg)  Height: '5\' 4"'$  (1.626 m)   Body mass index is 34.02 kg/m.  No results found.   Independent interpretation of notes and tests performed by another provider:   None  Procedures performed:   None  Pertinent History, Exam, Impression, and Recommendations:   Problem List Items Addressed This Visit       Cardiovascular and Mediastinum   Migraines    Chronic condition, well-controlled, symptoms every few months, self-limited, feels correlation with menstrual cycle.  We discussed medication management strategies and given her relatively minor symptoms, we can follow-up on this issue on an as-needed basis.      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  She recently followed up with her neurologist Dr. Leonie Man with [facility], advised 1 year follow-up.  She has some questions about anticoagulation, has recently finished course and plans to reestablish with interventional radiology group who originally managed her anticoagulation regimen.  We will follow peripherally on this issue.      Relevant Orders   Ambulatory referral to Interventional Radiology     Other   Class 1 obesity due to excess calories with serious comorbidity and body mass index (BMI) of 30.0 to 30.9 in adult    Longstanding concern, modest steady weight gain noted over interval visits, is regularly active with exercise.  We discussed maintaining caloric deficit, importance of sleep, and  she will start calorie counting, information provided for additional strategies for weight loss, she will obtain risk stratification labs, and she will return in a few months to discuss pharmacotherapy if indicated.      Need for immunization against influenza    Annual influenza vaccination administered today.      Relevant Orders   Flu Vaccine QUAD 57moIM (Fluarix, Fluzone & Alfiuria Quad PF) (Completed)     Orders & Medications No orders of the defined types were placed in this encounter.  Orders Placed This Encounter  Procedures   Flu Vaccine QUAD 655moM (Fluarix, Fluzone & Alfiuria Quad PF)   HM PAP SMEAR   Ambulatory referral to Interventional Radiology     Return in about 2 months (around 11/14/2022) for weight loss discussion.     JaMontel CulverMD   [facility]

## 2022-09-14 NOTE — Assessment & Plan Note (Addendum)
Longstanding concern, modest steady weight gain noted over interval visits, is regularly active with exercise.  We discussed maintaining caloric deficit, importance of sleep, and she will start calorie counting, information provided for additional strategies for weight loss, she will obtain risk stratification labs, and she will return in a few months to discuss pharmacotherapy if indicated.

## 2022-09-14 NOTE — Assessment & Plan Note (Signed)
Chronic condition, well-controlled, symptoms every few months, self-limited, feels correlation with menstrual cycle.  We discussed medication management strategies and given her relatively minor symptoms, we can follow-up on this issue on an as-needed basis.

## 2022-09-15 ENCOUNTER — Other Ambulatory Visit: Payer: Self-pay | Admitting: Family Medicine

## 2022-09-15 LAB — COMPREHENSIVE METABOLIC PANEL
ALT: 20 IU/L (ref 0–32)
AST: 19 IU/L (ref 0–40)
Albumin/Globulin Ratio: 2 (ref 1.2–2.2)
Albumin: 4.5 g/dL (ref 3.9–4.9)
Alkaline Phosphatase: 40 IU/L — ABNORMAL LOW (ref 44–121)
BUN/Creatinine Ratio: 15 (ref 9–23)
BUN: 17 mg/dL (ref 6–24)
Bilirubin Total: 0.4 mg/dL (ref 0.0–1.2)
CO2: 18 mmol/L — ABNORMAL LOW (ref 20–29)
Calcium: 9.5 mg/dL (ref 8.7–10.2)
Chloride: 105 mmol/L (ref 96–106)
Creatinine, Ser: 1.11 mg/dL — ABNORMAL HIGH (ref 0.57–1.00)
Globulin, Total: 2.2 g/dL (ref 1.5–4.5)
Glucose: 89 mg/dL (ref 70–99)
Potassium: 4.4 mmol/L (ref 3.5–5.2)
Sodium: 139 mmol/L (ref 134–144)
Total Protein: 6.7 g/dL (ref 6.0–8.5)
eGFR: 64 mL/min/{1.73_m2} (ref >59)

## 2022-09-15 LAB — APO A1 + B + RATIO
Apolipo. B/A-1 Ratio: 0.5 ratio (ref 0.0–0.6)
Apolipoprotein A-1: 142 mg/dL (ref 116–209)
Apolipoprotein B: 72 mg/dL (ref <90)

## 2022-09-15 LAB — LIPID PANEL
Chol/HDL Ratio: 2.8 ratio (ref 0.0–4.4)
Cholesterol, Total: 148 mg/dL (ref 100–199)
HDL: 52 mg/dL (ref >39)
LDL Chol Calc (NIH): 81 mg/dL (ref 0–99)
Triglycerides: 76 mg/dL (ref 0–149)
VLDL Cholesterol Cal: 15 mg/dL (ref 5–40)

## 2022-09-15 LAB — CBC
Hematocrit: 39.1 % (ref 34.0–46.6)
Hemoglobin: 12.8 g/dL (ref 11.1–15.9)
MCH: 30 pg (ref 26.6–33.0)
MCHC: 32.7 g/dL (ref 31.5–35.7)
MCV: 92 fL (ref 79–97)
Platelets: 241 10*3/uL (ref 150–450)
RBC: 4.27 x10E6/uL (ref 3.77–5.28)
RDW: 12.8 % (ref 11.7–15.4)
WBC: 5.2 10*3/uL (ref 3.4–10.8)

## 2022-09-15 LAB — VITAMIN D 25 HYDROXY (VIT D DEFICIENCY, FRACTURES): Vit D, 25-Hydroxy: 24.4 ng/mL — ABNORMAL LOW (ref 30.0–100.0)

## 2022-09-15 LAB — TSH: TSH: 2.39 u[IU]/mL (ref 0.450–4.500)

## 2022-09-15 LAB — HEPATITIS C ANTIBODY: Hep C Virus Ab: NONREACTIVE

## 2022-09-15 MED ORDER — VITAMIN D (ERGOCALCIFEROL) 1.25 MG (50000 UNIT) PO CAPS: 50000.0000 [IU] | ORAL_CAPSULE | ORAL | 0 refills | Status: DC

## 2022-11-16 ENCOUNTER — Encounter: Payer: Self-pay | Admitting: Family Medicine

## 2022-11-16 ENCOUNTER — Ambulatory Visit (INDEPENDENT_AMBULATORY_CARE_PROVIDER_SITE_OTHER): Payer: BC Managed Care – PPO | Admitting: Family Medicine

## 2022-11-16 VITALS — BP 110/76 | HR 72 | Ht 64.0 in | Wt 188.0 lb

## 2022-11-16 DIAGNOSIS — R0982 Postnasal drip: Secondary | ICD-10-CM | POA: Diagnosis not present

## 2022-11-16 DIAGNOSIS — Z7689 Persons encountering health services in other specified circumstances: Secondary | ICD-10-CM

## 2022-11-16 DIAGNOSIS — G43909 Migraine, unspecified, not intractable, without status migrainosus: Secondary | ICD-10-CM | POA: Diagnosis not present

## 2022-11-16 MED ORDER — TOPIRAMATE 25 MG PO CPSP: 25.0000 mg | ORAL_CAPSULE | Freq: Two times a day (BID) | ORAL | 0 refills | Status: DC

## 2022-11-16 MED ORDER — PROMETHAZINE-DM 6.25-15 MG/5ML PO SYRP: 5.0000 mL | ORAL_SOLUTION | Freq: Four times a day (QID) | ORAL | 0 refills | Status: DC | PRN

## 2022-11-16 NOTE — Patient Instructions (Addendum)
-  Use Flonase, Mucinex 12-hour, and Claritin/Zyrtec x 7 days then as-needed - Can use Rx cough syrup as-needed (consider at nighttime) - If symptoms persist without improvement after 7 days, contact us for next steps - Can review topiramate and start daily x 1 week then twice daily following that - If starting, contact our office for next steps / follow-up scheduling - Return for physical 03/2023

## 2022-11-16 NOTE — Assessment & Plan Note (Signed)
Few weeks of symptoms initially with facial pressure, has since resolved, now noting primarily nonproductive cough throughout the day, worst at night/overnight, does produce clear sputum at times, denies fevers, chills, current facial pressure, ear pain, throat pain, shortness of air.  Examination with benign cardiac sounds, clear lung fields throughout without wheezes, rales, rhonchi, oropharynx, tympanic membranes, canals benign, turbinates with mild erythema and swelling, nontender sinuses, no lymphadenopathy.  Patient most likely dealing with postnasal discharge related symptomatology, in turn considered secondary to previous URI.  Supportive care advised, OTC regimen outlined, as needed Rx Phenergan-DM prescribed.  She is to contact if symptoms persist after 1 week without improvement at which point infectious etiologies to be considered.

## 2022-11-16 NOTE — Assessment & Plan Note (Signed)
Chronic condition for which patient has been making recent strides with significant dietary modification, has lost 10 lbs intentionally since last visit.  We discussed further augmentation of her current weight loss strategies and she is amenable to initiation of topiramate.  This may provide additional benefit of comorbid migraine management.  She will review information regarding this medication and contact us if wishing to start it, Rx has been sent.

## 2022-11-16 NOTE — Assessment & Plan Note (Signed)
Chronic, relatively well-controlled, hormonally linked, has triptan from prior Rx, noting the symptoms at least monthly. See additional assessment(s) for plan details.

## 2022-11-16 NOTE — Progress Notes (Signed)
     Primary Care / Sports Medicine Office Visit  Patient Information:  Patient ID: Dana Henry, female DOB: 1981/01/15 Age: 42 y.o. MRN: [id number]   Dana Henry is a pleasant 42 y.o. female presenting with the following:  Chief Complaint  Patient presents with   Cough    Couple weeks, keeping up at night .     Vitals:   11/16/22 0820  BP: 110/76  Pulse: 72  SpO2: 98%   Vitals:   11/16/22 0820  Weight: 188 lb (85.3 kg)  Height: '5\' 4"'$  (1.626 m)   Body mass index is 32.27 kg/m.  No results found.   Independent interpretation of notes and tests performed by another provider:   None  Procedures performed:   None  Pertinent History, Exam, Impression, and Recommendations:   Dana Henry was seen today for cough.  Encounter for weight management Assessment & Plan: Chronic condition for which patient has been making recent strides with significant dietary modification, has lost 10 lbs intentionally since last visit.  We discussed further augmentation of her current weight loss strategies and she is amenable to initiation of topiramate.  This may provide additional benefit of comorbid migraine management.  She will review information regarding this medication and contact us if wishing to start it, Rx has been sent.  Orders: -     Topiramate; Take 1 capsule (25 mg total) by mouth 2 (two) times daily.  Dispense: 60 capsule; Refill: 0  Postnasal discharge Assessment & Plan: Few weeks of symptoms initially with facial pressure, has since resolved, now noting primarily nonproductive cough throughout the day, worst at night/overnight, does produce clear sputum at times, denies fevers, chills, current facial pressure, ear pain, throat pain, shortness of air.  Examination with benign cardiac sounds, clear lung fields throughout without wheezes, rales, rhonchi, oropharynx, tympanic membranes, canals benign, turbinates with mild erythema and swelling, nontender sinuses, no  lymphadenopathy.  Patient most likely dealing with postnasal discharge related symptomatology, in turn considered secondary to previous URI.  Supportive care advised, OTC regimen outlined, as needed Rx Phenergan-DM prescribed.  She is to contact if symptoms persist after 1 week without improvement at which point infectious etiologies to be considered.  Orders: -     Promethazine-DM; Take 5 mLs by mouth 4 (four) times daily as needed for cough.  Dispense: 118 mL; Refill: 0  Migraine without status migrainosus, not intractable, unspecified migraine type Assessment & Plan: Chronic, relatively well-controlled, hormonally linked, has triptan from prior Rx, noting the symptoms at least monthly. See additional assessment(s) for plan details.  Orders: -     Topiramate; Take 1 capsule (25 mg total) by mouth 2 (two) times daily.  Dispense: 60 capsule; Refill: 0     Orders & Medications Meds ordered this encounter  Medications   promethazine-dextromethorphan (PROMETHAZINE-DM) 6.25-15 MG/5ML syrup    Sig: Take 5 mLs by mouth 4 (four) times daily as needed for cough.    Dispense:  118 mL    Refill:  0   topiramate (TOPAMAX) 25 MG capsule    Sig: Take 1 capsule (25 mg total) by mouth 2 (two) times daily.    Dispense:  60 capsule    Refill:  0   No orders of the defined types were placed in this encounter.    Return for CPE.     Montel Culver, MD, [facility]   Primary Care Sports Medicine Primary Care and Sports Medicine at [facility]

## 2022-11-17 NOTE — Telephone Encounter (Signed)
FYI

## 2022-11-18 NOTE — Telephone Encounter (Signed)
Please advise, seems like it is a short term side effect

## 2022-12-13 ENCOUNTER — Other Ambulatory Visit: Payer: Self-pay | Admitting: Family Medicine

## 2022-12-13 DIAGNOSIS — G43909 Migraine, unspecified, not intractable, without status migrainosus: Secondary | ICD-10-CM

## 2022-12-13 DIAGNOSIS — Z7689 Persons encountering health services in other specified circumstances: Secondary | ICD-10-CM

## 2022-12-14 NOTE — Telephone Encounter (Signed)
Requested Prescriptions  Pending Prescriptions Disp Refills   topiramate (TOPAMAX) 25 MG capsule [Pharmacy Med Name: TOPIRAMATE 25 MG SPRINKLE CAP] 180 capsule 0    Sig: TAKE 1 CAPSULE BY MOUTH 2 TIMES DAILY.     Neurology: Anticonvulsants - topiramate & zonisamide Failed - 12/13/2022  1:05 AM      Failed - Cr in normal range and within 360 days    Creatinine, Ser  Date Value Ref Range Status  09/14/2022 1.11 (H) 0.57 - 1.00 mg/dL Final         Failed - CO2 in normal range and within 360 days    CO2  Date Value Ref Range Status  09/14/2022 18 (L) 20 - 29 mmol/L Final         Passed - ALT in normal range and within 360 days    ALT  Date Value Ref Range Status  09/14/2022 20 0 - 32 IU/L Final         Passed - AST in normal range and within 360 days    AST  Date Value Ref Range Status  09/14/2022 19 0 - 40 IU/L Final         Passed - Completed PHQ-2 or PHQ-9 in the last 360 days      Passed - Valid encounter within last 12 months    Recent Outpatient Visits           4 weeks ago Encounter for weight management   [place] [facility] [place], Earley Abide, MD   3 months ago 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

## 2022-12-15 ENCOUNTER — Ambulatory Visit: Payer: BC Managed Care – PPO | Admitting: Family Medicine

## 2023-03-23 ENCOUNTER — Encounter: Payer: Self-pay | Admitting: Family Medicine

## 2023-03-23 ENCOUNTER — Ambulatory Visit (INDEPENDENT_AMBULATORY_CARE_PROVIDER_SITE_OTHER): Payer: BC Managed Care – PPO | Admitting: Family Medicine

## 2023-03-23 VITALS — BP 120/76 | HR 70 | Ht 64.0 in | Wt 182.0 lb

## 2023-03-23 DIAGNOSIS — I63231 Cerebral infarction due to unspecified occlusion or stenosis of right carotid arteries: Secondary | ICD-10-CM | POA: Diagnosis not present

## 2023-03-23 DIAGNOSIS — Z0000 Encounter for general adult medical examination without abnormal findings: Secondary | ICD-10-CM | POA: Diagnosis not present

## 2023-03-23 DIAGNOSIS — I6521 Occlusion and stenosis of right carotid artery: Secondary | ICD-10-CM

## 2023-03-23 DIAGNOSIS — I63511 Cerebral infarction due to unspecified occlusion or stenosis of right middle cerebral artery: Secondary | ICD-10-CM | POA: Diagnosis not present

## 2023-03-23 DIAGNOSIS — E559 Vitamin D deficiency, unspecified: Secondary | ICD-10-CM | POA: Diagnosis not present

## 2023-03-23 DIAGNOSIS — G43909 Migraine, unspecified, not intractable, without status migrainosus: Secondary | ICD-10-CM

## 2023-03-23 MED ORDER — TOPIRAMATE 25 MG PO TABS: 25.0000 mg | ORAL_TABLET | Freq: Every evening | ORAL | 3 refills | Status: DC

## 2023-03-23 MED ORDER — METOCLOPRAMIDE HCL 10 MG PO TABS
10.0000 mg | ORAL_TABLET | Freq: Four times a day (QID) | ORAL | 0 refills | Status: AC | PRN
Start: 2023-03-23 — End: ?

## 2023-03-23 MED ORDER — NURTEC 75 MG PO TBDP
1.0000 | ORAL_TABLET | Freq: Every day | ORAL | 3 refills | Status: AC | PRN
Start: 2023-03-23 — End: ?

## 2023-03-23 MED ORDER — ATORVASTATIN CALCIUM 20 MG PO TABS
20.0000 mg | ORAL_TABLET | Freq: Every day | ORAL | 3 refills | Status: AC
Start: 2023-03-23 — End: ?

## 2023-03-23 MED ORDER — TOPIRAMATE 25 MG PO TABS
25.0000 mg | ORAL_TABLET | Freq: Every evening | ORAL | 3 refills | Status: DC | PRN
Start: 2023-03-23 — End: 2023-03-23

## 2023-03-23 NOTE — Telephone Encounter (Signed)
Please advsie

## 2023-03-23 NOTE — Progress Notes (Signed)
Annual Physical Exam Visit  Patient Information:  Patient ID: Dana Henry, female DOB: July 11, 1981 Age: 42 y.o. MRN: [id number]   Subjective:   CC: Annual Physical Exam  HPI:  Dana Henry is here for their annual physical.  I reviewed the past medical history, family history, social history, surgical history, and allergies today and changes were made as necessary.  Please see the problem list section below for additional details.  Past Medical History: Past Medical History:  Diagnosis Date   Anemia    Anxiety 11-22-20   Chronic kidney disease Kidney stones   2005   CVA (cerebral vascular accident) (HCC)    History of cesarean delivery affecting pregnancy 08/10/2017   History of kidney stones    surgically removed   Hyperlipidemia    Migraines    associated with menstrual cycle   Pneumonia    UTI (urinary tract infection)    Vaginal Pap smear, abnormal    Past Surgical History: Past Surgical History:  Procedure Laterality Date   BUBBLE STUDY  11/18/2020   Procedure: BUBBLE STUDY;  Surgeon: Parke Poisson, MD;  Location: [facility] ENDOSCOPY;  Service: Cardiology;;   CESAREAN SECTION N/A 07/21/2014   Procedure: CESAREAN SECTION;  Surgeon: Robley Fries, MD;  Location: [facility] ORS;  Service: Obstetrics;  Laterality: N/A;   CESAREAN SECTION N/A 08/10/2017   Procedure: Repeat CESAREAN SECTION;  Surgeon: Shea Evans, MD;  Location: [facility] BIRTHING SUITES;  Service: Obstetrics;  Laterality: N/A;  EDD: 08/17/17 Allergy: Sulfa   IR ANGIO INTRA EXTRACRAN SEL COM CAROTID INNOMINATE BILAT MOD SED  01/16/2021   IR CT HEAD LTD  11/13/2020   IR INTRA CRAN STENT  11/13/2020   IR INTRA CRAN STENT  02/05/2021   IR PERCUTANEOUS ART THROMBECTOMY/INFUSION INTRACRANIAL INC DIAG ANGIO  11/13/2020   IR RADIOLOGIST EVAL & MGMT  01/21/2021   IR RADIOLOGIST EVAL & MGMT  02/22/2021   IR TRANSCATH/EMBOLIZ  02/05/2021   IR US GUIDE VASC ACCESS RIGHT  02/05/2021   KIDNEY STONE SURGERY      RADIOLOGY WITH ANESTHESIA N/A 11/13/2020   Procedure: IR WITH ANESTHESIA;  Surgeon: Radiologist, Medication, MD;  Location: [facility] OR;  Service: Radiology;  Laterality: N/A;   RADIOLOGY WITH ANESTHESIA N/A 02/05/2021   Procedure: MRI WITH ANESTHESIA EMBOLIZATION;  Surgeon: Julieanne Cotton, MD;  Location: [facility] OR;  Service: Radiology;  Laterality: N/A;   TEE WITHOUT CARDIOVERSION N/A 11/18/2020   Procedure: TRANSESOPHAGEAL ECHOCARDIOGRAM (TEE);  Surgeon: Parke Poisson, MD;  Location: [facility] ENDOSCOPY;  Service: Cardiology;  Laterality: N/A;   TONSILLECTOMY AND ADENOIDECTOMY  10/19/1989   Family History: Family History  Problem Relation Age of Onset   Hypertension Mother    Asthma Mother    Alcohol abuse Father    Allergies: Allergies  Allergen Reactions   Sulfa Antibiotics Hives and Swelling   Health Maintenance: Health Maintenance  Topic Date Due   MAMMOGRAM  02/25/2023   COVID-19 Vaccine (3 - 2023-24 season) 09/30/2026 (Originally 06/19/2022)   INFLUENZA VACCINE  05/20/2023   PAP SMEAR-Modifier  03/02/2025   DTaP/Tdap/Td (3 - Td or Tdap) 06/02/2027   Hepatitis C Screening  Completed   HIV Screening  Completed   HPV VACCINES  Aged Out    HM Colonoscopy     This patient has no relevant Health Maintenance data.      Medications: Current Outpatient Medications on File Prior to Visit  Medication Sig Dispense Refill   aspirin 81 MG chewable tablet Chew 1  tablet (81 mg total) by mouth daily.     BIOTIN PO Take by mouth daily.     Multiple Vitamins-Minerals (MULTIVITAMIN WITH MINERALS) tablet Take 1 tablet by mouth daily.     No current facility-administered medications on file prior to visit.    Review of Systems:  +headache, No visual changes, nausea, vomiting, diarrhea, constipation, dizziness, abdominal pain, skin rash, fevers, chills, night sweats, swollen lymph nodes, weight loss, chest pain, body aches, joint swelling, muscle aches, shortness of breath, mood changes,  visual or auditory hallucinations reported.  Objective:   Vitals:   03/23/23 0823  BP: 120/76  Pulse: 70  SpO2: 98%   Vitals:   03/23/23 0823  Weight: 182 lb (82.6 kg)  Height: 5\' 4"  (1.626 m)   Body mass index is 31.24 kg/m.  General: Well Developed, well nourished, and in no acute distress.  Neuro: Alert and oriented x3, extra-ocular muscles intact, sensation grossly intact. Cranial nerves II through XII are grossly intact, motor, sensory, and coordinative functions are intact. HEENT: Normocephalic, atraumatic, pupils equal round reactive to light, neck supple, no masses, no lymphadenopathy, thyroid nonpalpable. Oropharynx, nasopharynx, external ear canals are unremarkable. Skin: Warm and dry, no rashes noted.  Cardiac: Regular rate and rhythm, no murmurs rubs or gallops. No peripheral edema. Pulses symmetric. Respiratory: Clear to auscultation bilaterally. Not using accessory muscles, speaking in full sentences.  Abdominal: Soft, nontender, nondistended, positive bowel sounds, no masses, no organomegaly. Musculoskeletal: Shoulder, elbow, wrist, hip, knee, ankle stable, and with full range of motion.  Female chaperone initials: KG present throughout the physical examination.  Impression and Recommendations:   The patient was counselled, risk factors were discussed, and anticipatory guidance given.  Problem List Items Addressed This Visit       Cardiovascular and Mediastinum   Stroke (cerebrum) (HCC)    Known history of the same, in addition to planned risk stratification labs, discussed utility of regular statin dosing.  Patient will obtain labs and then start atorvastatin 20 mg daily.  She has previously been on atorvastatin 40 mg daily.  Pending lipid profile, can consider future titration.      Relevant Medications   atorvastatin (LIPITOR) 20 MG tablet   Other Relevant Orders   CBC   Comprehensive metabolic panel   Lipid panel   TSH   Right middle cerebral artery  stroke (HCC)   Relevant Medications   atorvastatin (LIPITOR) 20 MG tablet   Migraines    Chronic issue, intermittently symptomatic, previously written for topiramate.  Patient does x 1 with noted fatigue.  Revisited the medication, dosing options, plan as follows: - Restart topiramate at 25 mg tablet nightly scheduled - Newly prescribed as needed Nurtec 75 mg - Can dose Reglan 10 mg every 6 hours as needed for nausea related to migraine - Close virtual video follow-up in 1 month      Relevant Medications   atorvastatin (LIPITOR) 20 MG tablet   metoCLOPramide (REGLAN) 10 MG tablet   Rimegepant Sulfate (NURTEC) 75 MG TBDP   topiramate (TOPAMAX) 25 MG tablet   Internal carotid artery stenosis, right    See additional assessment(s) for plan details.      Relevant Medications   atorvastatin (LIPITOR) 20 MG tablet     Other   Annual physical exam    Annual examination completed, risk stratification labs ordered, anticipatory guidance provided.  We will follow labs once resulted.      Relevant Orders   CBC   Comprehensive metabolic panel  Lipid panel   TSH   VITAMIN D 25 Hydroxy (Vit-D Deficiency, Fractures)   Other Visit Diagnoses     Healthcare maintenance    -  Primary   Relevant Orders   CBC   Comprehensive metabolic panel   Lipid panel   TSH   VITAMIN D 25 Hydroxy (Vit-D Deficiency, Fractures)   Vitamin D deficiency       Relevant Orders   VITAMIN D 25 Hydroxy (Vit-D Deficiency, Fractures)        Orders & Medications Medications:  Meds ordered this encounter  Medications   atorvastatin (LIPITOR) 20 MG tablet    Sig: Take 1 tablet (20 mg total) by mouth daily.    Dispense:  90 tablet    Refill:  3   DISCONTD: topiramate (TOPAMAX) 25 MG tablet    Sig: Take 1 tablet (25 mg total) by mouth at bedtime as needed.    Dispense:  90 tablet    Refill:  3   metoCLOPramide (REGLAN) 10 MG tablet    Sig: Take 1 tablet (10 mg total) by mouth every 6 (six) hours as  needed for nausea.    Dispense:  30 tablet    Refill:  0   Rimegepant Sulfate (NURTEC) 75 MG TBDP    Sig: Take 1 tablet (75 mg total) by mouth daily as needed (migraine). Maximum: 75 mg per 24 hours    Dispense:  8 tablet    Refill:  3   topiramate (TOPAMAX) 25 MG tablet    Sig: Take 1 tablet (25 mg total) by mouth at bedtime.    Dispense:  90 tablet    Refill:  3   Orders Placed This Encounter  Procedures   CBC   Comprehensive metabolic panel   Lipid panel   TSH   VITAMIN D 25 Hydroxy (Vit-D Deficiency, Fractures)     No follow-ups on file.    Jerrol Banana, MD, [facility]   Primary Care Sports Medicine Primary Care and Sports Medicine at [facility]

## 2023-03-23 NOTE — Assessment & Plan Note (Addendum)
Known history of the same, in addition to planned risk stratification labs, discussed utility of regular statin dosing.  Patient will obtain labs and then start atorvastatin 20 mg daily.  She has previously been on atorvastatin 40 mg daily.  Pending lipid profile, can consider future titration.

## 2023-03-23 NOTE — Patient Instructions (Addendum)
-   Obtain fasting labs with orders provided (can have water or black coffee but otherwise no food or drink x 8 hours before labs) - Review information provided - Attend eye doctor annually, dentist every 6 months, work towards or maintain 30 minutes of moderate intensity physical activity at least 5 days per week, and consume a balanced diet - Contact us for any questions between now and then  Additionally: - Restart topiramate at new lowest dose (25 mg) nightly at bedtime - Can use Nurtec as needed for breakthrough migraine (once in a 24-hour period) - Can use Reglan as needed every 6 hours for nausea related to migraine - Virtual MyChart video visit in 1 month

## 2023-03-23 NOTE — Assessment & Plan Note (Signed)
See additional assessment(s) for plan details. 

## 2023-03-23 NOTE — Assessment & Plan Note (Addendum)
Chronic issue, intermittently symptomatic, previously written for topiramate.  Patient does x 1 with noted fatigue.  Revisited the medication, dosing options, plan as follows: - Restart topiramate at 25 mg tablet nightly scheduled - Newly prescribed as needed Nurtec 75 mg - Can dose Reglan 10 mg every 6 hours as needed for nausea related to migraine - Close virtual video follow-up in 1 month

## 2023-03-23 NOTE — Assessment & Plan Note (Signed)
Annual examination completed, risk stratification labs ordered, anticipatory guidance provided.  We will follow labs once resulted. 

## 2023-03-31 DIAGNOSIS — Z0000 Encounter for general adult medical examination without abnormal findings: Secondary | ICD-10-CM | POA: Diagnosis not present

## 2023-03-31 DIAGNOSIS — I63231 Cerebral infarction due to unspecified occlusion or stenosis of right carotid arteries: Secondary | ICD-10-CM | POA: Diagnosis not present

## 2023-03-31 DIAGNOSIS — E559 Vitamin D deficiency, unspecified: Secondary | ICD-10-CM | POA: Diagnosis not present

## 2023-04-01 ENCOUNTER — Other Ambulatory Visit: Payer: Self-pay | Admitting: Family Medicine

## 2023-04-01 ENCOUNTER — Telehealth: Payer: Self-pay | Admitting: Family Medicine

## 2023-04-01 ENCOUNTER — Encounter: Payer: Self-pay | Admitting: Family Medicine

## 2023-04-01 DIAGNOSIS — E559 Vitamin D deficiency, unspecified: Secondary | ICD-10-CM

## 2023-04-01 LAB — COMPREHENSIVE METABOLIC PANEL
ALT: 13 IU/L (ref 0–32)
AST: 19 IU/L (ref 0–40)
Albumin/Globulin Ratio: 2.1
Albumin: 4.4 g/dL (ref 3.9–4.9)
Alkaline Phosphatase: 36 IU/L — ABNORMAL LOW (ref 44–121)
BUN/Creatinine Ratio: 12 (ref 9–23)
BUN: 13 mg/dL (ref 6–24)
Bilirubin Total: 0.3 mg/dL (ref 0.0–1.2)
CO2: 19 mmol/L — ABNORMAL LOW (ref 20–29)
Calcium: 9.6 mg/dL (ref 8.7–10.2)
Chloride: 105 mmol/L (ref 96–106)
Creatinine, Ser: 1.1 mg/dL — ABNORMAL HIGH (ref 0.57–1.00)
Globulin, Total: 2.1 g/dL (ref 1.5–4.5)
Glucose: 91 mg/dL (ref 70–99)
Potassium: 4.4 mmol/L (ref 3.5–5.2)
Sodium: 138 mmol/L (ref 134–144)
Total Protein: 6.5 g/dL (ref 6.0–8.5)
eGFR: 64 mL/min/{1.73_m2} (ref >59)

## 2023-04-01 LAB — CBC
Hematocrit: 40.2 % (ref 34.0–46.6)
Hemoglobin: 13.6 g/dL (ref 11.1–15.9)
MCH: 30.6 pg (ref 26.6–33.0)
MCHC: 33.8 g/dL (ref 31.5–35.7)
MCV: 91 fL (ref 79–97)
Platelets: 229 10*3/uL (ref 150–450)
RBC: 4.44 x10E6/uL (ref 3.77–5.28)
RDW: 12.2 % (ref 11.7–15.4)
WBC: 6.5 10*3/uL (ref 3.4–10.8)

## 2023-04-01 LAB — TSH: TSH: 1.89 u[IU]/mL (ref 0.450–4.500)

## 2023-04-01 LAB — LIPID PANEL
Chol/HDL Ratio: 3.5 ratio (ref 0.0–4.4)
Cholesterol, Total: 181 mg/dL (ref 100–199)
HDL: 52 mg/dL (ref >39)
LDL Chol Calc (NIH): 111 mg/dL — ABNORMAL HIGH (ref 0–99)
Triglycerides: 102 mg/dL (ref 0–149)
VLDL Cholesterol Cal: 18 mg/dL (ref 5–40)

## 2023-04-01 LAB — VITAMIN D 25 HYDROXY (VIT D DEFICIENCY, FRACTURES): Vit D, 25-Hydroxy: 26.7 ng/mL — ABNORMAL LOW (ref 30.0–100.0)

## 2023-04-01 MED ORDER — VITAMIN D (ERGOCALCIFEROL) 1.25 MG (50000 UNIT) PO CAPS
50000.0000 [IU] | ORAL_CAPSULE | ORAL | 0 refills | Status: DC
Start: 2023-04-01 — End: 2023-09-09

## 2023-04-01 NOTE — Telephone Encounter (Signed)
Copied from CRM [id number]. Topic: General - Other >> Apr 01, 2023  3:07 PM Alfred Levins wrote: Joni Reining with [facility] will be faxing over Nurtec paperwork  because authorization is needed. Insurance is not approving it, papers will be faxed over again (6/6 and 6/11, 6/13)

## 2023-04-01 NOTE — Telephone Encounter (Signed)
Please advise 

## 2023-04-01 NOTE — Telephone Encounter (Signed)
noted 

## 2023-04-28 ENCOUNTER — Telehealth: Payer: BC Managed Care – PPO | Admitting: Family Medicine

## 2023-04-28 ENCOUNTER — Other Ambulatory Visit: Payer: Self-pay | Admitting: Family Medicine

## 2023-04-28 DIAGNOSIS — Z1231 Encounter for screening mammogram for malignant neoplasm of breast: Secondary | ICD-10-CM | POA: Diagnosis not present

## 2023-04-28 DIAGNOSIS — R87612 Low grade squamous intraepithelial lesion on cytologic smear of cervix (LGSIL): Secondary | ICD-10-CM | POA: Diagnosis not present

## 2023-04-28 DIAGNOSIS — E559 Vitamin D deficiency, unspecified: Secondary | ICD-10-CM

## 2023-04-28 DIAGNOSIS — Z01419 Encounter for gynecological examination (general) (routine) without abnormal findings: Secondary | ICD-10-CM | POA: Diagnosis not present

## 2023-04-28 LAB — HM MAMMOGRAPHY

## 2023-04-28 NOTE — Telephone Encounter (Signed)
Requested medication (s) are due for refill today: routing for review  Requested medication (s) are on the active medication list: yes  Last refill:  04/01/23  Future visit scheduled: yes  Notes to clinic:  Unable to refill per protocol, cannot delegate. Medication given for short supply, should patient continue to take, routing for approval.      Requested Prescriptions  Pending Prescriptions Disp Refills   Vitamin D, Ergocalciferol, (DRISDOL) 1.25 MG (50000 UNIT) CAPS capsule [Pharmacy Med Name: VITAMIN D2 1.25MG (50,000 UNIT)] 12 capsule 1    Sig: Take 1 capsule (50,000 Units total) by mouth every 7 (seven) days. Take for 8 total doses(weeks)     Endocrinology:  Vitamins - Vitamin D Supplementation 2 Failed - 04/28/2023  4:30 AM      Failed - Manual Review: Route requests for 50,000 IU strength to the provider      Failed - Vitamin D in normal range and within 360 days    VITD  Date Value Ref Range Status  06/27/2015 25.60 (L) 30.00 - 100.00 ng/mL Final   Vit D, 25-Hydroxy  Date Value Ref Range Status  03/31/2023 26.7 (L) 30.0 - 100.0 ng/mL Final    Comment:    Vitamin D deficiency has been defined by the Institute of Medicine and an Endocrine Society practice guideline as a level of serum 25-OH vitamin D less than 20 ng/mL (1,2). The Endocrine Society went on to further define vitamin D insufficiency as a level between 21 and 29 ng/mL (2). 1. IOM (Institute of Medicine). 2010. Dietary reference    intakes for calcium and D. [place]: The    [employer]. 2. Holick MF, Binkley , Bischoff-Ferrari HA, et al.    Evaluation, treatment, and prevention of vitamin D    deficiency: an Endocrine Society clinical practice    guideline. JCEM. 2011 Jul; 96(7):1911-30.          Passed - Ca in normal range and within 360 days    Calcium  Date Value Ref Range Status  03/31/2023 9.6 8.7 - 10.2 mg/dL Final   Calcium, Ion  Date Value Ref Range Status  02/09/2022  1.22 1.15 - 1.40 mmol/L Final         Passed - Valid encounter within last 12 months    Recent Outpatient Visits           1 month ago Healthcare maintenance   [place] [facility], Ocie Bob, MD   5 months ago Encounter for weight management   [place] [facility] at [facility] Ashley Royalty, Ocie Bob, MD   7 months ago Cerebrovascular accident (CVA) due to occlusion of right carotid artery [facility])   [place] [facility] at [facility], Ocie Bob, MD   1 year ago Annual physical exam   [facility] Medicine at [facility], Ocie Bob, MD       Future Appointments             Tomorrow Ashley Royalty, Ocie Bob, MD [facility] Care & Sports Medicine at [facility], [facility]

## 2023-04-29 ENCOUNTER — Encounter: Payer: Self-pay | Admitting: Family Medicine

## 2023-04-29 ENCOUNTER — Telehealth (INDEPENDENT_AMBULATORY_CARE_PROVIDER_SITE_OTHER): Payer: BC Managed Care – PPO | Admitting: Family Medicine

## 2023-04-29 VITALS — Ht 64.0 in

## 2023-04-29 DIAGNOSIS — G43909 Migraine, unspecified, not intractable, without status migrainosus: Secondary | ICD-10-CM | POA: Diagnosis not present

## 2023-04-29 DIAGNOSIS — R7989 Other specified abnormal findings of blood chemistry: Secondary | ICD-10-CM | POA: Diagnosis not present

## 2023-04-29 DIAGNOSIS — R748 Abnormal levels of other serum enzymes: Secondary | ICD-10-CM

## 2023-04-29 DIAGNOSIS — Z7689 Persons encountering health services in other specified circumstances: Secondary | ICD-10-CM

## 2023-04-29 DIAGNOSIS — E559 Vitamin D deficiency, unspecified: Secondary | ICD-10-CM

## 2023-04-29 MED ORDER — TOPIRAMATE ER 50 MG PO CAP24
1.0000 | ORAL_CAPSULE | Freq: Every day | ORAL | 1 refills | Status: DC
Start: 2023-04-29 — End: 2023-12-30

## 2023-04-29 NOTE — Assessment & Plan Note (Signed)
Plan to recheck renal function at return, encouraged adequate hydration.

## 2023-04-29 NOTE — Assessment & Plan Note (Signed)
Tolerating topiramate 25 mg well without issues, noticing appetite suppression and associated weight loss.  Has had the added benefit of migraine control.  Given excellent tolerance of medication, plan as follows: - Titrated topiramate 50 mg daily - Continue healthy lifestyle changes for optimization of weight loss

## 2023-04-29 NOTE — Progress Notes (Signed)
Primary Care / Sports Medicine Virtual Visit  Patient Information:  Patient ID: Dana Henry, female DOB: December 23, 1980 Age: 42 y.o. MRN: [id number]   Dana Henry is a pleasant 42 y.o. female presenting with the following:  Chief Complaint  Patient presents with   Migraine    Doing well on medication, no side effects   Labs Only    Go over labs    Review of Systems: No fevers, chills, night sweats, weight loss, chest pain, or shortness of breath.   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   Dysphagia, post-stroke    Stroke (cerebrum) (HCC) 11/13/2020   Internal carotid artery stenosis, right 11/13/2020   Migraines 10/06/2012   Past Medical History:  Diagnosis Date   Anemia    Anxiety 11-22-20   Chronic kidney disease Kidney stones   2005   CVA (cerebral vascular accident) (HCC)    History of cesarean delivery affecting pregnancy 08/10/2017   History of kidney stones    surgically removed   Hyperlipidemia    Migraines    associated with menstrual cycle   Pneumonia    UTI (urinary tract infection)    Vaginal Pap smear, abnormal    Outpatient Encounter Medications as of 04/29/2023  Medication Sig   aspirin 81 MG chewable tablet Chew 1 tablet (81 mg total) by mouth daily.   atorvastatin (LIPITOR) 20 MG tablet Take 1 tablet (20 mg total) by mouth daily.   BIOTIN PO Take by mouth daily.   metoCLOPramide (REGLAN) 10 MG tablet Take 1 tablet (10 mg total) by mouth every 6 (six) hours as needed  for nausea.   Multiple Vitamins-Minerals (MULTIVITAMIN WITH MINERALS) tablet Take 1 tablet by mouth daily.   Rimegepant Sulfate (NURTEC) 75 MG TBDP Take 1 tablet (75 mg total) by mouth daily as needed (migraine). Maximum: 75 mg per 24 hours   Topiramate ER (TROKENDI XR) 50 MG CP24 Take 1 capsule (50 mg total) by mouth daily.   Vitamin D, Ergocalciferol, (DRISDOL) 1.25 MG (50000 UNIT) CAPS capsule Take 1 capsule (50,000 Units total) by mouth every 7 (seven) days. Take for 8 total doses(weeks)   [DISCONTINUED] topiramate (TOPAMAX) 25 MG tablet Take 1 tablet (25 mg total) by mouth at bedtime.   No facility-administered encounter medications on file as of 04/29/2023.   Past Surgical History:  Procedure Laterality Date   BUBBLE STUDY  11/18/2020   Procedure: BUBBLE STUDY;  Surgeon: Parke Poisson, MD;  Location: [facility] ENDOSCOPY;  Service: Cardiology;;   CESAREAN SECTION N/A 07/21/2014   Procedure: CESAREAN SECTION;  Surgeon: Robley Fries, MD;  Location: [facility] ORS;  Service: Obstetrics;  Laterality: N/A;   CESAREAN SECTION N/A 08/10/2017   Procedure: Repeat CESAREAN SECTION;  Surgeon: Shea Evans, MD;  Location: [facility] BIRTHING SUITES;  Service: Obstetrics;  Laterality: N/A;  EDD: 08/17/17 Allergy: Sulfa   IR ANGIO INTRA EXTRACRAN SEL COM CAROTID INNOMINATE BILAT MOD SED  01/16/2021   IR CT HEAD LTD  11/13/2020  IR INTRA CRAN STENT  11/13/2020   IR INTRA CRAN STENT  02/05/2021   IR PERCUTANEOUS ART THROMBECTOMY/INFUSION INTRACRANIAL INC DIAG ANGIO  11/13/2020   IR RADIOLOGIST EVAL & MGMT  01/21/2021   IR RADIOLOGIST EVAL & MGMT  02/22/2021   IR TRANSCATH/EMBOLIZ  02/05/2021   IR US GUIDE VASC ACCESS RIGHT  02/05/2021   KIDNEY STONE SURGERY     RADIOLOGY WITH ANESTHESIA N/A 11/13/2020   Procedure: IR WITH ANESTHESIA;  Surgeon: Radiologist, Medication, MD;  Location: [facility] OR;  Service: Radiology;  Laterality: N/A;   RADIOLOGY WITH ANESTHESIA N/A 02/05/2021   Procedure: MRI WITH ANESTHESIA  EMBOLIZATION;  Surgeon: Julieanne Cotton, MD;  Location: [facility] OR;  Service: Radiology;  Laterality: N/A;   TEE WITHOUT CARDIOVERSION N/A 11/18/2020   Procedure: TRANSESOPHAGEAL ECHOCARDIOGRAM (TEE);  Surgeon: Parke Poisson, MD;  Location: [facility] ENDOSCOPY;  Service: Cardiology;  Laterality: N/A;   TONSILLECTOMY AND ADENOIDECTOMY  10/19/1989    Virtual Visit via MyChart Video:   I connected with Dana Henry on 04/29/23 via MyChart Video and verified that I am speaking with the correct person using appropriate identifiers.   The limitations, risks, security and privacy concerns of performing an evaluation and management service by MyChart Video, including the higher likelihood of inaccurate diagnoses and treatments, and the availability of in person appointments were reviewed. The possible need of an additional face-to-face encounter for complete and high quality delivery of care was discussed. The patient was also made aware that there may be a patient responsible charge related to this service. The patient expressed understanding and wishes to proceed.  Provider location is in medical facility. Patient location is at their home, different from provider location. People involved in care of the patient during this telehealth encounter were myself, my nurse/medical assistant, and my front office/scheduling team member.  Objective findings:   General: Speaking full sentences, no audible heavy breathing. Sounds alert and appropriately interactive. Well-appearing. Face symmetric. Extraocular movements intact. Pupils equal and round. No nasal flaring or accessory muscle use visualized.  Independent interpretation of notes and tests performed by another provider:   None  Pertinent History, Exam, Impression, and Recommendations:   Migraines Has noted essential resolution of symptoms without breakthrough migraines since initiation of topiramate.  She was also able to obtain Nurtec but has not  needed this medication.  -Continue topiramate, will titrate for added appetite control/weight reduction, see additional assessment(s) for plan details.  Abnormal alkaline phosphatase test Noted on recent labs, has been noted in the past  - Plan for follow-up with repeat CMP, bone specific ALP  Azotemia Plan to recheck renal function at return, encouraged adequate hydration.  Vitamin D deficiency Nearly completed recent Rx course, plan for recheck at return.  Encounter for weight management Tolerating topiramate 25 mg well without issues, noticing appetite suppression and associated weight loss.  Has had the added benefit of migraine control.  Given excellent tolerance of medication, plan as follows: - Titrated topiramate 50 mg daily - Continue healthy lifestyle changes for optimization of weight loss   Orders & Medications Meds ordered this encounter  Medications   Topiramate ER (TROKENDI XR) 50 MG CP24    Sig: Take 1 capsule (50 mg total) by mouth daily.    Dispense:  90 capsule    Refill:  1   No orders of the defined types were placed in this encounter.    I discussed the above assessment and treatment plan with the patient. The patient was provided  an opportunity to ask questions and all were answered. The patient agreed with the plan and demonstrated an understanding of the instructions.   The patient was advised to call back or seek an in-person evaluation if the symptoms worsen or if the condition fails to improve as anticipated.   I provided a total time of 30 minutes including both face-to-face and non-face-to-face time on 04/29/2023 inclusive of time utilized for medical chart review, information gathering, care coordination with staff, and documentation completion.    Jerrol Banana, MD, [facility]   Primary Care Sports Medicine Primary Care and Sports Medicine at [facility]

## 2023-04-29 NOTE — Patient Instructions (Signed)
-   Take new dose of topiramate 50 mg daily - Continue healthy lifestyle changes - Ensure adequate hydration (drink plenty of water) - Return for follow-up mid November, contact our office to obtain labs prior to that visit - Contact us for any question/concerns between now and then

## 2023-04-29 NOTE — Assessment & Plan Note (Signed)
Noted on recent labs, has been noted in the past  - Plan for follow-up with repeat CMP, bone specific ALP

## 2023-04-29 NOTE — Assessment & Plan Note (Signed)
Nearly completed recent Rx course, plan for recheck at return.

## 2023-04-29 NOTE — Assessment & Plan Note (Signed)
Has noted essential resolution of symptoms without breakthrough migraines since initiation of topiramate.  She was also able to obtain Nurtec but has not needed this medication.  -Continue topiramate, will titrate for added appetite control/weight reduction, see additional assessment(s) for plan details.

## 2023-09-01 ENCOUNTER — Ambulatory Visit: Payer: BC Managed Care – PPO | Admitting: Neurology

## 2023-09-01 ENCOUNTER — Encounter: Payer: Self-pay | Admitting: Neurology

## 2023-09-08 ENCOUNTER — Ambulatory Visit: Payer: BC Managed Care – PPO | Admitting: Family Medicine

## 2023-09-09 ENCOUNTER — Ambulatory Visit (INDEPENDENT_AMBULATORY_CARE_PROVIDER_SITE_OTHER): Payer: BC Managed Care – PPO | Admitting: Family Medicine

## 2023-09-09 ENCOUNTER — Encounter: Payer: Self-pay | Admitting: Family Medicine

## 2023-09-09 VITALS — BP 120/84 | HR 63 | Ht 64.0 in | Wt 173.0 lb

## 2023-09-09 DIAGNOSIS — Z23 Encounter for immunization: Secondary | ICD-10-CM

## 2023-09-09 DIAGNOSIS — G43909 Migraine, unspecified, not intractable, without status migrainosus: Secondary | ICD-10-CM | POA: Diagnosis not present

## 2023-09-09 DIAGNOSIS — M778 Other enthesopathies, not elsewhere classified: Secondary | ICD-10-CM | POA: Diagnosis not present

## 2023-09-09 DIAGNOSIS — R7989 Other specified abnormal findings of blood chemistry: Secondary | ICD-10-CM

## 2023-09-09 DIAGNOSIS — R748 Abnormal levels of other serum enzymes: Secondary | ICD-10-CM

## 2023-09-09 DIAGNOSIS — E559 Vitamin D deficiency, unspecified: Secondary | ICD-10-CM

## 2023-09-09 NOTE — Progress Notes (Signed)
     Primary Care / Sports Medicine Office Visit  Patient Information:  Patient ID: Dana Henry, female DOB: 08/22/1981 Age: 42 y.o. MRN: [id number]   Dana Henry is a pleasant 42 y.o. female presenting with the following:  Chief Complaint  Patient presents with   Migraine    Hasn't had migraines in a while    Hyperlipidemia    Vitals:   09/09/23 1609  BP: 120/84  Pulse: 63  SpO2: 98%   Vitals:   09/09/23 1609  Weight: 173 lb (78.5 kg)  Height: 5\' 4"  (1.626 m)   Body mass index is 29.7 kg/m.  No results found.   Independent interpretation of notes and tests performed by another provider:   None  Procedures performed:   None  Pertinent History, Exam, Impression, and Recommendations:   Problem List Items Addressed This Visit       Cardiovascular and Mediastinum   Migraines - Primary    The patient, with a history of migraines, presents for a follow-up appointment. She reports that she has not experienced a migraine since her last visit and has not had to take the prescribed medication, Nurtec. The patient also mentions that she has not had any issues getting the medication covered or mailed to her. She has not experienced any nausea since her last visit.  Migraine No recent episodes. Patient has Nurtec on hand but has not needed to use it. -Continue current management plan (daily topiramate)        Musculoskeletal and Integument   Left wrist tendinitis    Also reports a pain in her left wrist when she puts pressure on it, such as when doing push-ups. She has not noticed any other symptoms related to this pain.   MUSCULOSKELETAL: No tenderness or discomfort on palpation of distal radius, epicondyles, ulnar head, or carpal bones. Limited range of motion in wrist with pain on flexion, extension, when compared to contralateral. Strength intact with resistance testing of wrist flexion and extension. No discomfort with pronation or supination.  Left wrist  tendinitis Pain in left wrist with pressure, particularly during push-ups. Examination suggests tight forearm structures manifesting at the wrist, presenting as tendonitis. -Implement stretching and strengthening exercises as detailed in the provided handout. -If symptoms persist, patient to contact the office.        Other   Vitamin D deficiency   Relevant Orders   VITAMIN D 25 Hydroxy (Vit-D Deficiency, Fractures)   Azotemia   Relevant Orders   Comprehensive Metabolic Panel (CMET)   Abnormal alkaline phosphatase test   Relevant Orders   Comprehensive Metabolic Panel (CMET)   Alkaline phosphatase, bone specific   Other Visit Diagnoses     Need for influenza vaccination       Relevant Orders   Flu vaccine trivalent PF, 6mos and older(Flulaval,Afluria,Fluarix,Fluzone) (Completed)        Orders & Medications Medications: No orders of the defined types were placed in this encounter.  Orders Placed This Encounter  Procedures   Flu vaccine trivalent PF, 6mos and older(Flulaval,Afluria,Fluarix,Fluzone)   Comprehensive Metabolic Panel (CMET)   Alkaline phosphatase, bone specific   VITAMIN D 25 Hydroxy (Vit-D Deficiency, Fractures)     No follow-ups on file.     Jerrol Banana, MD, [facility]   Primary Care Sports Medicine Primary Care and Sports Medicine at [facility]

## 2023-09-09 NOTE — Patient Instructions (Signed)
VISIT SUMMARY:  During your follow-up appointment, we discussed your migraines, wrist pain, and general health maintenance. You reported no recent migraines and no issues with your medication. We also addressed your wrist pain and administered your flu shot.  YOUR PLAN:  -MIGRAINE: You have not experienced any recent episodes and have not needed to use your medication, Nurtec. Continue with your current management plan.  -WRIST PAIN: Wrist pain can be caused by tight structures in the wrist, possibly leading to tendonitis, which is inflammation of the tendons. You should follow the stretching and strengthening exercises provided in the handout. If the pain persists, please contact our office.  -GENERAL HEALTH MAINTENANCE: We administered your flu shot today. We also ordered labs to follow up on your previous results, including tests for alkaline phosphatase, kidney function, and vitamin D levels. Additionally, we scheduled your next physical for mid-June 2025.  INSTRUCTIONS:  If your wrist pain persists, please contact our office. Follow up on the lab tests as ordered, and remember to attend your scheduled physical in mid-June 2025.

## 2023-09-09 NOTE — Assessment & Plan Note (Signed)
The patient, with a history of migraines, presents for a follow-up appointment. She reports that she has not experienced a migraine since her last visit and has not had to take the prescribed medication, Nurtec. The patient also mentions that she has not had any issues getting the medication covered or mailed to her. She has not experienced any nausea since her last visit.  Migraine No recent episodes. Patient has Nurtec on hand but has not needed to use it. -Continue current management plan (daily topiramate)

## 2023-09-09 NOTE — Assessment & Plan Note (Signed)
Also reports a pain in her left wrist when she puts pressure on it, such as when doing push-ups. She has not noticed any other symptoms related to this pain.   MUSCULOSKELETAL: No tenderness or discomfort on palpation of distal radius, epicondyles, ulnar head, or carpal bones. Limited range of motion in wrist with pain on flexion, extension, when compared to contralateral. Strength intact with resistance testing of wrist flexion and extension. No discomfort with pronation or supination.  Left wrist tendinitis Pain in left wrist with pressure, particularly during push-ups. Examination suggests tight forearm structures manifesting at the wrist, presenting as tendonitis. -Implement stretching and strengthening exercises as detailed in the provided handout. -If symptoms persist, patient to contact the office.

## 2023-12-30 ENCOUNTER — Other Ambulatory Visit: Payer: Self-pay | Admitting: Family Medicine

## 2023-12-30 DIAGNOSIS — Z7689 Persons encountering health services in other specified circumstances: Secondary | ICD-10-CM

## 2023-12-30 DIAGNOSIS — G43909 Migraine, unspecified, not intractable, without status migrainosus: Secondary | ICD-10-CM

## 2023-12-30 NOTE — Telephone Encounter (Signed)
 Requested Prescriptions  Pending Prescriptions Disp Refills   Topiramate ER (TROKENDI XR) 50 MG CP24 [Pharmacy Med Name: TOPIRAMATE ER 50 MG CAPSULE] 30 capsule 2    Sig: TAKE 1 CAPSULE BY MOUTH EVERY DAY     Neurology: Anticonvulsants - topiramate & zonisamide Failed - 12/30/2023 12:42 PM      Failed - Cr in normal range and within 360 days    Creatinine, Ser  Date Value Ref Range Status  03/31/2023 1.10 (H) 0.57 - 1.00 mg/dL Final         Failed - CO2 in normal range and within 360 days    CO2  Date Value Ref Range Status  03/31/2023 19 (L) 20 - 29 mmol/L Final         Passed - ALT in normal range and within 360 days    ALT  Date Value Ref Range Status  03/31/2023 13 0 - 32 IU/L Final         Passed - AST in normal range and within 360 days    AST  Date Value Ref Range Status  03/31/2023 19 0 - 40 IU/L Final         Passed - Completed PHQ-2 or PHQ-9 in the last 360 days      Passed - Valid encounter within last 12 months    Recent Outpatient Visits           3 months ago Migraine without status migrainosus, not intractable, unspecified migraine type   [place] [facility] at [facility], Ocie Bob, MD   8 months ago Migraine without status migrainosus, not intractable, unspecified migraine type   [facility] Care [facility] Medicine at [facility], Ocie Bob, MD   9 months ago Healthcare maintenance   [facility] & [facility] Medicine at [facility], Ocie Bob, MD   1 year ago Encounter for weight management   [place] [facility] at [facility]ty, Ocie Bob, MD   1 year ago Cerebrovascular accident (CVA) due to occlusion of right carotid artery [facility])   [place] [facility] Care & Sports Medicine at [facility], Ocie Bob, MD       Future Appointments             In 3 months Ashley Royalty, Ocie Bob, MD [facility] Primary Care & Sports Medicine at  [facility], [facility]

## 2024-04-05 ENCOUNTER — Encounter: Payer: Self-pay | Admitting: Family Medicine

## 2024-04-05 ENCOUNTER — Ambulatory Visit (INDEPENDENT_AMBULATORY_CARE_PROVIDER_SITE_OTHER): Payer: Self-pay | Admitting: Family Medicine

## 2024-04-05 VITALS — BP 106/74 | HR 61 | Ht 64.0 in | Wt 172.0 lb

## 2024-04-05 DIAGNOSIS — R7309 Other abnormal glucose: Secondary | ICD-10-CM

## 2024-04-05 DIAGNOSIS — E559 Vitamin D deficiency, unspecified: Secondary | ICD-10-CM | POA: Diagnosis not present

## 2024-04-05 DIAGNOSIS — Z0000 Encounter for general adult medical examination without abnormal findings: Secondary | ICD-10-CM | POA: Diagnosis not present

## 2024-04-05 DIAGNOSIS — Z1231 Encounter for screening mammogram for malignant neoplasm of breast: Secondary | ICD-10-CM

## 2024-04-05 DIAGNOSIS — J301 Allergic rhinitis due to pollen: Secondary | ICD-10-CM | POA: Insufficient documentation

## 2024-04-05 DIAGNOSIS — Z136 Encounter for screening for cardiovascular disorders: Secondary | ICD-10-CM

## 2024-04-05 DIAGNOSIS — G43909 Migraine, unspecified, not intractable, without status migrainosus: Secondary | ICD-10-CM

## 2024-04-05 DIAGNOSIS — K5901 Slow transit constipation: Secondary | ICD-10-CM

## 2024-04-05 DIAGNOSIS — M778 Other enthesopathies, not elsewhere classified: Secondary | ICD-10-CM

## 2024-04-05 NOTE — Assessment & Plan Note (Signed)
 Constipation Chronic constipation likely due to slow transit and dietary factors. Improvement noted with Miralax  gummies. - Continue Miralax  gummies as needed for constipation relief. - Increase dietary fiber and hydration to improve bowel regularity. - Provide information on MyChart regarding constipation management and dietary recommendations. - Consider referral to GI if symptoms do not improve with current management.

## 2024-04-05 NOTE — Patient Instructions (Addendum)
-   Obtain fasting labs with orders provided (can have water or black coffee but otherwise no food or drink x 8 hours before labs) - Review information provided - Attend eye doctor annually, dentist every 6 months, work towards or maintain 30 minutes of moderate intensity physical activity at least 5 days per week, and consume a balanced diet - Return in 1 year for physical - Contact us  for any questions between now and then   Patient Action Plan  1. Healthcare Maintenance:    - Complete the lab tests as ordered. Your healthcare provider will review the results once available.  2. Left Wrist Tendonitis:    - Increase the frequency of wrist stretching exercises to daily.    - If symptoms persist, consider physical therapy for manual therapy and eccentric exercises.    - Access wrist tendon exercises provided via MyChart.    - Avoid using a wrist brace unless symptoms become severe.  3. Migraine Management:    - Continue taking topiramate  daily as it effectively manages your migraines.  4. Seasonal Allergic Rhinitis:    - Follow your current allergy management plan as needed.  5. Constipation Management:    - Continue using Miralax  gummies as needed for relief.    - Increase dietary fiber and water intake to improve bowel regularity.    - Access information on constipation management and dietary recommendations via MyChart.    - Consider a referral to a gastroenterologist if symptoms do not improve with current management.  6. Vitamin D  Deficiency:    - Complete the Vitamin D  lab test as ordered.  Red Flags: - If you experience any worsening of symptoms or new health concerns, contact your healthcare provider. - Report any severe pain or changes in your wrist condition to your healthcare provider.

## 2024-04-05 NOTE — Assessment & Plan Note (Signed)
 Her history of migraines has improved significantly with the use of topiramate , and she does not experience them frequently anymore.  Migraine No recent episodes. -Continue current management plan (daily topiramate )

## 2024-04-05 NOTE — Progress Notes (Signed)
 Annual Physical Exam Visit  Patient Information:  Patient ID: Dana Henry, female DOB: 1981/06/17 Age: 43 y.o. MRN: [id number]   Subjective:   CC: Annual Physical Exam  HPI:  Dana Henry is here for their annual physical.  I reviewed the past medical history, family history, social history, surgical history, and allergies today and changes were made as necessary.  Please see the problem list section below for additional details.  Past Medical History: Past Medical History:  Diagnosis Date   Anemia    Anxiety 11-22-20   Chronic kidney disease Kidney stones   2005   CVA (cerebral vascular accident) (HCC)    History of cesarean delivery affecting pregnancy 08/10/2017   History of kidney stones    surgically removed   Hyperlipidemia    Migraines    associated with menstrual cycle   Pneumonia    UTI (urinary tract infection)    Vaginal Pap smear, abnormal    Past Surgical History: Past Surgical History:  Procedure Laterality Date   BUBBLE STUDY  11/18/2020   Procedure: BUBBLE STUDY;  Surgeon: Euell Herrlich, MD;  Location: [facility] ENDOSCOPY;  Service: Cardiology;;   CESAREAN SECTION N/A 07/21/2014   Procedure: CESAREAN SECTION;  Surgeon: Shasta Deist, MD;  Location: [facility] ORS;  Service: Obstetrics;  Laterality: N/A;   CESAREAN SECTION N/A 08/10/2017   Procedure: Repeat CESAREAN SECTION;  Surgeon: Terri Fester, MD;  Location: [facility] BIRTHING SUITES;  Service: Obstetrics;  Laterality: N/A;  EDD: 08/17/17 Allergy: Sulfa   IR ANGIO INTRA EXTRACRAN SEL COM CAROTID INNOMINATE BILAT MOD SED  01/16/2021   IR CT HEAD LTD  11/13/2020   IR INTRA CRAN STENT  11/13/2020   IR INTRA CRAN STENT  02/05/2021   IR PERCUTANEOUS ART THROMBECTOMY/INFUSION INTRACRANIAL INC DIAG ANGIO  11/13/2020   IR RADIOLOGIST EVAL & MGMT  01/21/2021   IR RADIOLOGIST EVAL & MGMT  02/22/2021   IR TRANSCATH/EMBOLIZ  02/05/2021   IR US  GUIDE VASC ACCESS RIGHT  02/05/2021   KIDNEY STONE SURGERY      RADIOLOGY WITH ANESTHESIA N/A 11/13/2020   Procedure: IR WITH ANESTHESIA;  Surgeon: Radiologist, Medication, MD;  Location: [facility] OR;  Service: Radiology;  Laterality: N/A;   RADIOLOGY WITH ANESTHESIA N/A 02/05/2021   Procedure: MRI WITH ANESTHESIA EMBOLIZATION;  Surgeon: Luellen Sages, MD;  Location: [facility] OR;  Service: Radiology;  Laterality: N/A;   TEE WITHOUT CARDIOVERSION N/A 11/18/2020   Procedure: TRANSESOPHAGEAL ECHOCARDIOGRAM (TEE);  Surgeon: Euell Herrlich, MD;  Location: [facility] ENDOSCOPY;  Service: Cardiology;  Laterality: N/A;   TONSILLECTOMY AND ADENOIDECTOMY  10/19/1989   Family History: Family History  Problem Relation Age of Onset   Hypertension Mother    Asthma Mother    Alcohol  abuse Father    Allergies: Allergies  Allergen Reactions   Sulfa Antibiotics Hives and Swelling   Cat Dander    Health Maintenance: Health Maintenance  Topic Date Due   HPV VACCINES (1 - 3-dose SCDM series) Never done   COVID-19 Vaccine (3 - 2024-25 season) 06/20/2023   MAMMOGRAM  04/27/2024   INFLUENZA VACCINE  05/19/2024   Cervical Cancer Screening (HPV/Pap Cotest)  03/03/2027   DTaP/Tdap/Td (3 - Td or Tdap) 06/02/2027   Hepatitis C Screening  Completed   HIV Screening  Completed   Pneumococcal Vaccine 63-68 Years old  Aged Out   Meningococcal B Vaccine  Aged Out    HM Colonoscopy   This patient has no relevant Health Maintenance data.  Medications: Current Outpatient Medications on File Prior to Visit  Medication Sig Dispense Refill   aspirin  81 MG chewable tablet Chew 1 tablet (81 mg total) by mouth daily.     atorvastatin  (LIPITOR) 20 MG tablet Take 1 tablet (20 mg total) by mouth daily. 90 tablet 3   BIOTIN PO Take by mouth daily.     metoCLOPramide  (REGLAN ) 10 MG tablet Take 1 tablet (10 mg total) by mouth every 6 (six) hours as needed for nausea. 30 tablet 0   Multiple Vitamins-Minerals (MULTIVITAMIN WITH MINERALS) tablet Take 1 tablet by mouth daily.     Rimegepant  Sulfate (NURTEC) 75 MG TBDP Take 1 tablet (75 mg total) by mouth daily as needed (migraine). Maximum: 75 mg per 24 hours 8 tablet 3   Topiramate  ER (TROKENDI  XR) 50 MG CP24 TAKE 1 CAPSULE BY MOUTH EVERY DAY 30 capsule 2   No current facility-administered medications on file prior to visit.    Objective:   Vitals:   04/05/24 0849  BP: 106/74  Pulse: 61  SpO2: 99%   Vitals:   04/05/24 0849  Weight: 172 lb (78 kg)  Height: 5' 4 (1.626 m)   Body mass index is 29.52 kg/m.  General: Well Developed, well nourished, and in no acute distress.  Neuro: Alert and oriented x3, extra-ocular muscles intact, sensation grossly intact. Cranial nerves II through XII are grossly intact, motor, sensory, and coordinative functions are intact. HEENT: Normocephalic, atraumatic, neck supple, no masses, no lymphadenopathy, thyroid  nonenlarged. Oropharynx benign, nasopharynx with mild erythema, external ear canals are unremarkable. Skin: Warm and dry, no rashes noted.  Cardiac: Regular rate and rhythm, no murmurs rubs or gallops. No peripheral edema. Pulses symmetric. Respiratory: Clear to auscultation bilaterally. Speaking in full sentences.  Abdominal: Soft, nontender, nondistended, positive bowel sounds, no masses, no organomegaly. Musculoskeletal: Stable, and with full range of motion. RANGE OF MOTION: Symmetric range of motion in hands and elbows without pain or tenderness. Non-tender volar wrist along flexor tendons. SPECIAL TESTS: Negative for medial epicondylitis.  Impression and Recommendations:   The patient was counselled, risk factors were discussed, and anticipatory guidance given.  Problem List Items Addressed This Visit     Healthcare maintenance - Primary   Annual examination completed, risk stratification labs ordered, anticipatory guidance provided.  We will follow labs once resulted.       Relevant Orders   CBC   Left wrist tendinitis   Left wrist tendonitis Chronic left wrist  tendonitis with palmar pain due to muscle imbalance. Persistent symptoms suggest flexor muscle tightness. - Increase frequency of stretching exercises to daily. - Consider physical therapy for manual therapy and eccentric motion exercises if symptoms persist. - Provide wrist tendon exercises via MyChart. - Avoid use of a brace unless symptoms become severe.      Migraines   Her history of migraines has improved significantly with the use of topiramate , and she does not experience them frequently anymore.  Migraine No recent episodes. -Continue current management plan (daily topiramate )      Seasonal allergic rhinitis due to pollen   Slow transit constipation   Constipation Chronic constipation likely due to slow transit and dietary factors. Improvement noted with Miralax  gummies. - Continue Miralax  gummies as needed for constipation relief. - Increase dietary fiber and hydration to improve bowel regularity. - Provide information on MyChart regarding constipation management and dietary recommendations. - Consider referral to GI if symptoms do not improve with current management.  Vitamin D  deficiency   Relevant Orders   VITAMIN D  25 Hydroxy (Vit-D Deficiency, Fractures)   Other Visit Diagnoses       Abnormal glucose       Relevant Orders   Hemoglobin A1c     Breast cancer screening by mammogram         Screening for cardiovascular condition       Relevant Orders   Comprehensive metabolic panel with GFR   Lipid panel        Orders & Medications Medications: No orders of the defined types were placed in this encounter.  Orders Placed This Encounter  Procedures   CBC   Comprehensive metabolic panel with GFR   Hemoglobin A1c   Lipid panel   VITAMIN D  25 Hydroxy (Vit-D Deficiency, Fractures)     Return in about 1 year (around 04/05/2025) for CPE.    Ma Saupe, MD, [facility]   Primary Care Sports Medicine Primary Care and Sports Medicine at [facility]

## 2024-04-05 NOTE — Assessment & Plan Note (Signed)
 Annual examination completed, risk stratification labs ordered, anticipatory guidance provided.  We will follow labs once resulted.

## 2024-04-05 NOTE — Assessment & Plan Note (Signed)
 Left wrist tendonitis Chronic left wrist tendonitis with palmar pain due to muscle imbalance. Persistent symptoms suggest flexor muscle tightness. - Increase frequency of stretching exercises to daily. - Consider physical therapy for manual therapy and eccentric motion exercises if symptoms persist. - Provide wrist tendon exercises via MyChart. - Avoid use of a brace unless symptoms become severe.

## 2024-04-06 ENCOUNTER — Ambulatory Visit: Payer: Self-pay | Admitting: Family Medicine

## 2024-04-06 LAB — COMPREHENSIVE METABOLIC PANEL WITH GFR
ALT: 15 IU/L (ref 0–32)
AST: 19 IU/L (ref 0–40)
Albumin: 4.4 g/dL (ref 3.9–4.9)
Alkaline Phosphatase: 40 IU/L — ABNORMAL LOW (ref 44–121)
BUN/Creatinine Ratio: 17 (ref 9–23)
BUN: 17 mg/dL (ref 6–24)
Bilirubin Total: 0.4 mg/dL (ref 0.0–1.2)
CO2: 19 mmol/L — ABNORMAL LOW (ref 20–29)
Calcium: 9.2 mg/dL (ref 8.7–10.2)
Chloride: 108 mmol/L — ABNORMAL HIGH (ref 96–106)
Creatinine, Ser: 1 mg/dL (ref 0.57–1.00)
Globulin, Total: 2.1 g/dL (ref 1.5–4.5)
Glucose: 78 mg/dL (ref 70–99)
Potassium: 4.3 mmol/L (ref 3.5–5.2)
Sodium: 141 mmol/L (ref 134–144)
Total Protein: 6.5 g/dL (ref 6.0–8.5)
eGFR: 72 mL/min/{1.73_m2} (ref >59)

## 2024-04-06 LAB — CBC
Hematocrit: 40 % (ref 34.0–46.6)
Hemoglobin: 12.8 g/dL (ref 11.1–15.9)
MCH: 30.7 pg (ref 26.6–33.0)
MCHC: 32 g/dL (ref 31.5–35.7)
MCV: 96 fL (ref 79–97)
Platelets: 233 10*3/uL (ref 150–450)
RBC: 4.17 x10E6/uL (ref 3.77–5.28)
RDW: 12.3 % (ref 11.7–15.4)
WBC: 4.6 10*3/uL (ref 3.4–10.8)

## 2024-04-06 LAB — HEMOGLOBIN A1C
Est. average glucose Bld gHb Est-mCnc: 105 mg/dL
Hgb A1c MFr Bld: 5.3 % (ref 4.8–5.6)

## 2024-04-06 LAB — LIPID PANEL
Chol/HDL Ratio: 2.6 ratio (ref 0.0–4.4)
Cholesterol, Total: 131 mg/dL (ref 100–199)
HDL: 50 mg/dL (ref >39)
LDL Chol Calc (NIH): 70 mg/dL (ref 0–99)
Triglycerides: 49 mg/dL (ref 0–149)
VLDL Cholesterol Cal: 11 mg/dL (ref 5–40)

## 2024-04-06 LAB — VITAMIN D 25 HYDROXY (VIT D DEFICIENCY, FRACTURES): Vit D, 25-Hydroxy: 32.5 ng/mL (ref 30.0–100.0)

## 2024-04-14 ENCOUNTER — Encounter (HOSPITAL_COMMUNITY): Payer: Self-pay | Admitting: Interventional Radiology

## 2024-07-24 ENCOUNTER — Encounter: Payer: Self-pay | Admitting: Family Medicine

## 2024-07-25 ENCOUNTER — Ambulatory Visit: Admitting: Family Medicine

## 2024-07-25 ENCOUNTER — Encounter: Payer: Self-pay | Admitting: Family Medicine

## 2024-07-25 VITALS — BP 106/62 | HR 82 | Ht 64.0 in | Wt 177.0 lb

## 2024-07-25 DIAGNOSIS — E6609 Other obesity due to excess calories: Secondary | ICD-10-CM | POA: Diagnosis not present

## 2024-07-25 DIAGNOSIS — N390 Urinary tract infection, site not specified: Secondary | ICD-10-CM | POA: Diagnosis not present

## 2024-07-25 DIAGNOSIS — Z6830 Body mass index (BMI) 30.0-30.9, adult: Secondary | ICD-10-CM

## 2024-07-25 DIAGNOSIS — E66811 Obesity, class 1: Secondary | ICD-10-CM

## 2024-07-25 DIAGNOSIS — R350 Frequency of micturition: Secondary | ICD-10-CM | POA: Diagnosis not present

## 2024-07-25 LAB — POCT URINALYSIS DIPSTICK
Bilirubin, UA: NEGATIVE
Blood, UA: POSITIVE
Glucose, UA: NEGATIVE
Ketones, UA: NEGATIVE
Nitrite, UA: NEGATIVE
Protein, UA: NEGATIVE
Spec Grav, UA: 1.01 (ref 1.010–1.025)
Urobilinogen, UA: 0.2 U/dL
pH, UA: 5 (ref 5.0–8.0)

## 2024-07-25 MED ORDER — NITROFURANTOIN MONOHYD MACRO 100 MG PO CAPS: 100.0000 mg | ORAL_CAPSULE | Freq: Two times a day (BID) | ORAL | 0 refills | Status: AC

## 2024-07-26 DIAGNOSIS — N390 Urinary tract infection, site not specified: Secondary | ICD-10-CM | POA: Insufficient documentation

## 2024-07-26 NOTE — Assessment & Plan Note (Addendum)
 History of Present Illness Dana Henry is a 43 year old female who presents with symptoms of a urinary tract infection.  Dysuria and lower urinary tract symptoms - Burning sensation at the end of urination for approximately one week - Sensation of pressure during urination - Slight increase in urinary frequency - No changes in urine color or odor - No hematuria - No flank pain - No vaginal or urethral discharge - No recent changes in personal hygiene products or sexual activity - Allergy to sulfa medications   Latest Reference Range & Units 07/25/24 13:31  Bilirubin, UA  Negative  Clarity, UA  Hazy  Color, UA  Yellow  Glucose Negative  Negative  Ketones, UA  Negative  Leukocytes,UA Negative  Large (3+) !  Nitrite, UA  Negative  pH, UA 5.0 - 8.0  5.0  Protein,UA Negative  Negative  Specific Gravity, UA 1.010 - 1.025  1.010  Urobilinogen, UA 0.2 or 1.0 E.U./dL 0.2  RBC, UA  Positive  !: Data is abnormal  Assessment and Plan Uncomplicated urinary tract infection Symptoms consistent with uncomplicated UTI. Likely simple cystitis. Sulfa allergy noted. - Prescribe Macrobid (nitrofurantoin) 100 mg twice daily for 7 days. - Send urine sample for culture to identify specific bacteria and antibiotic susceptibility. - Advise increased water intake. - Monitor for yeast infection symptoms; consider fluconazole if symptoms occur. - Contact with urine culture results and adjust treatment if necessary.

## 2024-07-26 NOTE — Assessment & Plan Note (Signed)
 Weight gain and physical activity - Slight weight increase attributed to reduced exercise - Reduced exercise due to increased work demands and returning to school - Currently taking topiramate  for weight management     07/25/2024    1:13 PM 04/05/2024    8:49 AM 09/09/2023    4:09 PM 04/29/2023    2:52 PM 03/23/2023    8:23 AM 11/16/2022    8:20 AM 09/14/2022    8:38 AM  Vitals with BMI  Height 5' 4 5' 4 5' 4 5' 4 5' 4 5' 4 5' 4  Weight 177 lbs 172 lbs 173 lbs  182 lbs 188 lbs 198 lbs 3 oz  BMI 30.37 29.51 29.68  31.22 32.25 34  Systolic 106 106 879  120 110 879  Diastolic 62 74 84  76 76 78  Pulse 82 61 63  70 72 76   Obesity She has made progress but with plateau / uptick. Discussed potential use of GLP-1 receptor agonists for weight loss. Insurance coverage is a potential barrier. Explained GLP-1 receptor agonists curb appetite with gastrointestinal side effects. - Advise to check with insurance regarding coverage for GLP-1 receptor agonists. - Provide verbiage for insurance inquiry. - Discuss potential out-of-pocket costs if insurance does not cover medication.

## 2024-07-26 NOTE — Progress Notes (Signed)
 Primary Care / Sports Medicine Office Visit  Patient Information:  Patient ID: Dana Henry, female DOB: Aug 27, 1981 Age: 42 y.o. MRN: [id number]   Dana Henry is a pleasant 43 y.o. female presenting with the following:  Chief Complaint  Patient presents with   Urinary Tract Infection    Frequency and urgency (pressure) since last Monday 07/17/24.     Vitals:   07/25/24 1313  BP: 106/62  Pulse: 82  SpO2: 99%   Vitals:   07/25/24 1313  Weight: 177 lb (80.3 kg)  Height: 5' 4 (1.626 m)   Body mass index is 30.38 kg/m.  No results found.   Discussed the use of AI scribe software for clinical note transcription with the patient, who gave verbal consent to proceed.   Independent interpretation of notes and tests performed by another provider:   None  Procedures performed:   None  Pertinent History, Exam, Impression, and Recommendations:   Problem List Items Addressed This Visit     Class 1 obesity due to excess calories with serious comorbidity and body mass index (BMI) of 30.0 to 30.9 in adult   Weight gain and physical activity - Slight weight increase attributed to reduced exercise - Reduced exercise due to increased work demands and returning to school - Currently taking topiramate  for weight management     07/25/2024    1:13 PM 04/05/2024    8:49 AM 09/09/2023    4:09 PM 04/29/2023    2:52 PM 03/23/2023    8:23 AM 11/16/2022    8:20 AM 09/14/2022    8:38 AM  Vitals with BMI  Height 5' 4 5' 4 5' 4 5' 4 5' 4 5' 4 5' 4  Weight 177 lbs 172 lbs 173 lbs  182 lbs 188 lbs 198 lbs 3 oz  BMI 30.37 29.51 29.68  31.22 32.25 34  Systolic 106 106 879  120 110 879  Diastolic 62 74 84  76 76 78  Pulse 82 61 63  70 72 76   Obesity She has made progress but with plateau / uptick. Discussed potential use of GLP-1 receptor agonists for weight loss. Insurance coverage is a potential barrier. Explained GLP-1 receptor agonists curb appetite with  gastrointestinal side effects. - Advise to check with insurance regarding coverage for GLP-1 receptor agonists. - Provide verbiage for insurance inquiry. - Discuss potential out-of-pocket costs if insurance does not cover medication.      UTI (urinary tract infection), uncomplicated - Primary   History of Present Illness Dana Henry is a 43 year old female who presents with symptoms of a urinary tract infection.  Dysuria and lower urinary tract symptoms - Burning sensation at the end of urination for approximately one week - Sensation of pressure during urination - Slight increase in urinary frequency - No changes in urine color or odor - No hematuria - No flank pain - No vaginal or urethral discharge - No recent changes in personal hygiene products or sexual activity - Allergy to sulfa medications   Latest Reference Range & Units 07/25/24 13:31  Bilirubin, UA  Negative  Clarity, UA  Hazy  Color, UA  Yellow  Glucose Negative  Negative  Ketones, UA  Negative  Leukocytes,UA Negative  Large (3+) !  Nitrite, UA  Negative  pH, UA 5.0 - 8.0  5.0  Protein,UA Negative  Negative  Specific Gravity, UA 1.010 - 1.025  1.010  Urobilinogen, UA 0.2 or 1.0 E.U./dL 0.2  RBC, UA  Positive  !: Data is abnormal  Assessment and Plan Uncomplicated urinary tract infection Symptoms consistent with uncomplicated UTI. Likely simple cystitis. Sulfa allergy noted. - Prescribe Macrobid (nitrofurantoin) 100 mg twice daily for 7 days. - Send urine sample for culture to identify specific bacteria and antibiotic susceptibility. - Advise increased water intake. - Monitor for yeast infection symptoms; consider fluconazole if symptoms occur. - Contact with urine culture results and adjust treatment if necessary.      Relevant Medications   nitrofurantoin, macrocrystal-monohydrate, (MACROBID) 100 MG capsule   Other Relevant Orders   POCT urinalysis dipstick (Completed)   Urine Culture      Orders & Medications Medications:  Meds ordered this encounter  Medications   nitrofurantoin, macrocrystal-monohydrate, (MACROBID) 100 MG capsule    Sig: Take 1 capsule (100 mg total) by mouth 2 (two) times daily.    Dispense:  14 capsule    Refill:  0   Orders Placed This Encounter  Procedures   Urine Culture   POCT urinalysis dipstick     No follow-ups on file.     Selinda JINNY Ku, MD, [facility]   Primary Care Sports Medicine Primary Care and Sports Medicine at [facility]

## 2024-07-26 NOTE — Patient Instructions (Signed)
 VISIT SUMMARY:  Today, you were seen for symptoms of a urinary tract infection and concerns about weight management. We discussed your current symptoms, medications, and potential treatment options.  YOUR PLAN:  UNCOMPLICATED URINARY TRACT INFECTION: You have symptoms of a urinary tract infection, likely simple cystitis. -Continue taking Macrobid (nitrofurantoin) 100 mg twice daily for 7 days. -We have sent a urine sample for culture to identify the specific bacteria and the best antibiotic to use. -Increase your water intake. -Monitor for symptoms of a yeast infection. If you notice any, we may consider prescribing fluconazole. -We will contact you with the urine culture results and adjust your treatment if necessary.  WEIGHT: We discussed your weight management today. -Check with your insurance regarding coverage for GLP-1 receptor agonists, which can help with weight loss by curbing appetite. -Review information below when reaching out to your insurance company. -Be aware of potential out-of-pocket costs if your insurance does not cover the medication.  My physician has determined that I meet the medical necessity criteria for [Wegovy/Zepbound] due to a BMI of Body mass index is 30.38 kg/m. , which classifies me as having Class 1 obesity. Given that obesity is a chronic disease with significant health risks, my provider has prescribed this medication as part of a comprehensive weight management plan to reduce my risk of obesity-related complications, including hypertension, type 2 diabetes, and cardiovascular disease. As this medication is FDA-approved for chronic weight management in individuals with a BMI >=30 or >=27 with comorbidities, I am requesting coverage in alignment with standard medical guidelines and my provider's clinical judgment.

## 2024-07-28 ENCOUNTER — Ambulatory Visit: Payer: Self-pay | Admitting: Family Medicine

## 2024-07-28 LAB — URINE CULTURE

## 2024-08-11 DIAGNOSIS — Z1331 Encounter for screening for depression: Secondary | ICD-10-CM | POA: Diagnosis not present

## 2024-08-11 DIAGNOSIS — Z01419 Encounter for gynecological examination (general) (routine) without abnormal findings: Secondary | ICD-10-CM | POA: Diagnosis not present

## 2024-08-11 DIAGNOSIS — R87610 Atypical squamous cells of undetermined significance on cytologic smear of cervix (ASC-US): Secondary | ICD-10-CM | POA: Diagnosis not present

## 2024-08-11 DIAGNOSIS — Z1231 Encounter for screening mammogram for malignant neoplasm of breast: Secondary | ICD-10-CM | POA: Diagnosis not present

## 2024-08-11 LAB — HM MAMMOGRAPHY

## 2025-04-09 ENCOUNTER — Encounter: Admitting: Family Medicine
